# Patient Record
Sex: Female | Born: 1935 | Race: White | Hispanic: No | Smoking: Former smoker
Health system: Southern US, Community
[De-identification: ages and names within clinical notes are randomized; demographics above are authoritative.]

## PROBLEM LIST (undated history)

## (undated) DIAGNOSIS — I2510 Atherosclerotic heart disease of native coronary artery without angina pectoris: Secondary | ICD-10-CM

## (undated) DIAGNOSIS — I5189 Other ill-defined heart diseases: Secondary | ICD-10-CM

## (undated) DIAGNOSIS — G4733 Obstructive sleep apnea (adult) (pediatric): Secondary | ICD-10-CM

## (undated) DIAGNOSIS — Z87891 Personal history of nicotine dependence: Secondary | ICD-10-CM

## (undated) DIAGNOSIS — R058 Other specified cough: Secondary | ICD-10-CM

## (undated) DIAGNOSIS — T464X5A Adverse effect of angiotensin-converting-enzyme inhibitors, initial encounter: Secondary | ICD-10-CM

## (undated) DIAGNOSIS — J449 Chronic obstructive pulmonary disease, unspecified: Secondary | ICD-10-CM

## (undated) DIAGNOSIS — I272 Pulmonary hypertension, unspecified: Secondary | ICD-10-CM

## (undated) DIAGNOSIS — R05 Cough: Secondary | ICD-10-CM

## (undated) DIAGNOSIS — I10 Essential (primary) hypertension: Secondary | ICD-10-CM

## (undated) HISTORY — DX: Pulmonary hypertension, unspecified: I27.20

## (undated) HISTORY — PX: VAGINAL HYSTERECTOMY: SUR661

## (undated) HISTORY — PX: CATARACT EXTRACTION, BILATERAL: SHX1313

## (undated) HISTORY — DX: Cough: R05

## (undated) HISTORY — PX: TONSILLECTOMY: SUR1361

## (undated) HISTORY — PX: GALLBLADDER SURGERY: SHX652

## (undated) HISTORY — DX: Atherosclerotic heart disease of native coronary artery without angina pectoris: I25.10

## (undated) HISTORY — PX: CARPAL TUNNEL RELEASE: SHX101

## (undated) HISTORY — PX: VARICOSE VEIN SURGERY: SHX832

## (undated) HISTORY — DX: Other ill-defined heart diseases: I51.89

## (undated) HISTORY — DX: Essential (primary) hypertension: I10

## (undated) HISTORY — DX: Chronic obstructive pulmonary disease, unspecified: J44.9

## (undated) HISTORY — DX: Obstructive sleep apnea (adult) (pediatric): G47.33

## (undated) HISTORY — DX: Other specified cough: R05.8

## (undated) HISTORY — DX: Adverse effect of angiotensin-converting-enzyme inhibitors, initial encounter: T46.4X5A

## (undated) HISTORY — PX: BLADDER REPAIR: SHX76

## (undated) HISTORY — DX: Personal history of nicotine dependence: Z87.891

---

## 1997-12-03 ENCOUNTER — Other Ambulatory Visit: Admission: RE | Admit: 1997-12-03 | Discharge: 1997-12-03 | Payer: Self-pay | Admitting: Family Medicine

## 1998-08-13 HISTORY — PX: OTHER SURGICAL HISTORY: SHX169

## 1998-09-23 ENCOUNTER — Encounter: Payer: Self-pay | Admitting: Family Medicine

## 1998-09-23 ENCOUNTER — Ambulatory Visit (HOSPITAL_COMMUNITY): Admission: RE | Admit: 1998-09-23 | Discharge: 1998-09-23 | Payer: Self-pay | Admitting: Family Medicine

## 1998-12-01 ENCOUNTER — Encounter: Payer: Self-pay | Admitting: Obstetrics & Gynecology

## 1998-12-05 ENCOUNTER — Encounter: Payer: Self-pay | Admitting: Obstetrics & Gynecology

## 1998-12-06 ENCOUNTER — Inpatient Hospital Stay (HOSPITAL_COMMUNITY): Admission: RE | Admit: 1998-12-06 | Discharge: 1998-12-08 | Payer: Self-pay | Admitting: Obstetrics & Gynecology

## 1999-02-06 ENCOUNTER — Encounter: Payer: Self-pay | Admitting: Thoracic Surgery (Cardiothoracic Vascular Surgery)

## 1999-02-07 ENCOUNTER — Inpatient Hospital Stay (HOSPITAL_COMMUNITY)
Admission: RE | Admit: 1999-02-07 | Discharge: 1999-02-11 | Payer: Self-pay | Admitting: Thoracic Surgery (Cardiothoracic Vascular Surgery)

## 1999-02-07 ENCOUNTER — Encounter: Payer: Self-pay | Admitting: Thoracic Surgery (Cardiothoracic Vascular Surgery)

## 1999-02-08 ENCOUNTER — Encounter: Payer: Self-pay | Admitting: Thoracic Surgery (Cardiothoracic Vascular Surgery)

## 1999-02-10 ENCOUNTER — Encounter: Payer: Self-pay | Admitting: Thoracic Surgery (Cardiothoracic Vascular Surgery)

## 1999-06-19 ENCOUNTER — Other Ambulatory Visit: Admission: RE | Admit: 1999-06-19 | Discharge: 1999-06-19 | Payer: Self-pay | Admitting: Obstetrics & Gynecology

## 1999-09-29 ENCOUNTER — Encounter: Payer: Self-pay | Admitting: Family Medicine

## 1999-09-29 ENCOUNTER — Ambulatory Visit (HOSPITAL_COMMUNITY): Admission: RE | Admit: 1999-09-29 | Discharge: 1999-09-29 | Payer: Self-pay | Admitting: Family Medicine

## 2000-07-19 ENCOUNTER — Other Ambulatory Visit: Admission: RE | Admit: 2000-07-19 | Discharge: 2000-07-19 | Payer: Self-pay | Admitting: Obstetrics & Gynecology

## 2000-09-30 ENCOUNTER — Encounter: Payer: Self-pay | Admitting: Family Medicine

## 2000-09-30 ENCOUNTER — Ambulatory Visit (HOSPITAL_COMMUNITY): Admission: RE | Admit: 2000-09-30 | Discharge: 2000-09-30 | Payer: Self-pay | Admitting: Family Medicine

## 2001-10-02 ENCOUNTER — Encounter: Payer: Self-pay | Admitting: Family Medicine

## 2001-10-02 ENCOUNTER — Ambulatory Visit (HOSPITAL_COMMUNITY): Admission: RE | Admit: 2001-10-02 | Discharge: 2001-10-02 | Payer: Self-pay | Admitting: Family Medicine

## 2002-03-23 ENCOUNTER — Encounter: Admission: RE | Admit: 2002-03-23 | Discharge: 2002-03-23 | Payer: Self-pay | Admitting: Podiatry

## 2002-03-23 ENCOUNTER — Encounter: Payer: Self-pay | Admitting: Podiatry

## 2002-03-25 ENCOUNTER — Ambulatory Visit (HOSPITAL_BASED_OUTPATIENT_CLINIC_OR_DEPARTMENT_OTHER): Admission: RE | Admit: 2002-03-25 | Discharge: 2002-03-25 | Payer: Self-pay | Admitting: Podiatry

## 2002-08-27 ENCOUNTER — Other Ambulatory Visit: Admission: RE | Admit: 2002-08-27 | Discharge: 2002-08-27 | Payer: Self-pay | Admitting: Obstetrics & Gynecology

## 2002-10-07 ENCOUNTER — Ambulatory Visit (HOSPITAL_COMMUNITY): Admission: RE | Admit: 2002-10-07 | Discharge: 2002-10-07 | Payer: Self-pay | Admitting: *Deleted

## 2004-09-14 ENCOUNTER — Other Ambulatory Visit: Admission: RE | Admit: 2004-09-14 | Discharge: 2004-09-14 | Payer: Self-pay | Admitting: Obstetrics & Gynecology

## 2004-09-18 ENCOUNTER — Encounter: Admission: RE | Admit: 2004-09-18 | Discharge: 2004-09-18 | Payer: Self-pay | Admitting: Family Medicine

## 2005-10-23 ENCOUNTER — Other Ambulatory Visit: Admission: RE | Admit: 2005-10-23 | Discharge: 2005-10-23 | Payer: Self-pay | Admitting: Obstetrics & Gynecology

## 2005-11-06 ENCOUNTER — Encounter: Admission: RE | Admit: 2005-11-06 | Discharge: 2005-11-06 | Payer: Self-pay | Admitting: Family Medicine

## 2006-04-18 ENCOUNTER — Encounter: Admission: RE | Admit: 2006-04-18 | Discharge: 2006-05-16 | Payer: Self-pay | Admitting: Family Medicine

## 2007-05-01 ENCOUNTER — Ambulatory Visit (HOSPITAL_COMMUNITY): Admission: RE | Admit: 2007-05-01 | Discharge: 2007-05-01 | Payer: Self-pay | Admitting: Cardiovascular Disease

## 2007-05-02 ENCOUNTER — Ambulatory Visit (HOSPITAL_COMMUNITY): Admission: RE | Admit: 2007-05-02 | Discharge: 2007-05-02 | Payer: Self-pay | Admitting: Cardiovascular Disease

## 2007-05-14 HISTORY — PX: OTHER SURGICAL HISTORY: SHX169

## 2007-05-20 ENCOUNTER — Ambulatory Visit (HOSPITAL_COMMUNITY): Admission: RE | Admit: 2007-05-20 | Discharge: 2007-05-20 | Payer: Self-pay | Admitting: Cardiovascular Disease

## 2007-05-20 HISTORY — PX: CARDIAC CATHETERIZATION: SHX172

## 2008-10-20 ENCOUNTER — Encounter: Admission: RE | Admit: 2008-10-20 | Discharge: 2008-10-20 | Payer: Self-pay | Admitting: Family Medicine

## 2008-11-29 ENCOUNTER — Encounter: Admission: RE | Admit: 2008-11-29 | Discharge: 2008-11-29 | Payer: Self-pay | Admitting: Obstetrics & Gynecology

## 2010-05-15 ENCOUNTER — Encounter: Admission: RE | Admit: 2010-05-15 | Discharge: 2010-05-15 | Payer: Self-pay | Admitting: Family Medicine

## 2010-12-26 NOTE — Cardiovascular Report (Signed)
NAMETAMICHA, TOWSLEY NO.:  [id number]   MEDICAL RECORD NO.:  [id number]          PATIENT TYPE:  OIB   LOCATION:  2899                         FACILITY:  [facility]   PHYSICIAN:  Shelva Majestic, M.D.     DATE OF BIRTH:  1927/03/27   DATE OF PROCEDURE:  05/20/2007  DATE OF DISCHARGE:                            CARDIAC CATHETERIZATION   INDICATIONS:  Jo Hendrix is a very pleasant, 77 year old female who  has history of hypertension with documented diastolic dysfunction and  mild pulmonary hypertension.  She has noticed recent increasing  shortness of breath, particularly exertionally precipitated.  Nuclear  Myoview study demonstrated mild to moderate ischemia in the mid  anteroseptal region at a workload of only 6 METS suggesting possible LAD  ischemia.  Definitive diagnostic cardiac catheterization was  recommended.   PROCEDURE:  After premedication with Valium 5 mg intravenously, the  patient was prepped and draped in usual fashion.  The right femoral  artery was punctured anteriorly and a 5-French sheath was inserted  without difficulty.  Initially, a 5-French, FL-4 catheter was inserted,  but ultimately this was exchanged for a 5-French, FL 3.5 catheter.  Selective angiography in the left coronary system was performed.  A 5-  [place] FR-4 catheter was used for selective angiography in the right  coronary artery.  There was an anterior takeoff to the RCA vessel.  A 5-  French pigtail catheter was used for biplane cine left ventriculography.  With the patient's history of difficult to control hypertension, distal  aortography was also done to make certain she did not have any  renovascular etiology to her hypertension.  Hemostasis was obtained by  direct manual pressure.  The patient tolerated the procedure well.   HEMODYNAMIC DATA:  Central aortic pressure is 155/74.  Left ventricular  pressure is 155/15.   ANGIOGRAPHIC DATA:  Left main coronary artery  immediately gave rise to a  septal perforating vessel in the region of the ostium.  The left main  was a long vessel which distally bifurcated into an LAD and circumflex  vessel.  Left main was free of obstructive disease.   The left anterior descending artery had evidence for mild 20% narrowing  proximally after the takeoff of the first septal perforating artery and  before the diagonal vessel.  The midportion of the LAD seemed to dip  intramyocardially and there was a subtle suggestion of very mild  midsystolic bridging.  The apical LAD extended to the apex.   The circumflex vessel was angiographically normal and gave rise to a  proximal bifurcating marginal vessel as well as a second marginal  vessel.   The right coronary artery had an anterior takeoff.  There was luminal  irregularity with narrowing of 40-50% diffusely in the mid RCA segment  proximal to the marginal branch.  The RCA ended in the PDA and small  posterolateral vessel.   Biplane cine left ventriculography revealed normal contractility without  definitive focal segmental wall motion abnormality.  Ejection fraction  is at least 55%.   Distal aortography demonstrated mild luminal irregularity of  the  infrarenal aorta without obstructive disease.  Renal arteries were  widely patent without stenosis.  There was no significant iliac disease.   IMPRESSION:  1. Normal left ventricular function.  2. Mild coronary obstructive disease with 20% narrowing in the      proximal LAD with an area of mild midsystolic bridging in the mid      left anterior descending and evidence for diffuse luminal      irregularity of 40-50% in the mid right coronary artery.   RECOMMENDATIONS:  Medical therapy.           ______________________________  Shelva Majestic, M.D.     TK/MEDQ  D:  05/20/2007  T:  05/20/2007  Job:  [id number]   cc:   Catheterization Lab  [facility]

## 2010-12-29 NOTE — Op Note (Signed)
Jo Hendrix, Jo Hendrix                             ACCOUNT NO.:  [id number]   MEDICAL RECORD NO.:  [id number]                    PATIENT TYPE:   LOCATION:                                       FACILITY:   PHYSICIAN:  Norma S. Regal, D.P.M.              DATE OF BIRTH:   DATE OF PROCEDURE:  03/25/2002  DATE OF DISCHARGE:                                 OPERATIVE REPORT   PREOPERATIVE DIAGNOSES:  Plantar fasciitis, left heel.   POSTOPERATIVE DIAGNOSES:  Plantar fasciitis, left heel.   OPERATION PERFORMED:  Endoscopic release, medial fascial band of the left  heel.   SURGEON:  Amado Coe. Regal, D.P.M.   ANESTHESIA:  General inhalation by department of anesthesia.   TOURNIQUET:  Ankle tourniquet, left foot.   INDICATIONS FOR PROCEDURE:  Chronic discomfort, inability to walk without  pain, failure to respond to conservative steroidal, nonsteroidal and  mechanical support therapy.   DESCRIPTION OF PROCEDURE:  The patient was brought to the operating room and  placed to the operating room and placed in supine position on the operating  table.  She was then given general inhalation anesthesia.  I did a  preoperative block with 2 cc Xylocaine and a posterior tib block and 10 cc  Xylocaine Marcaine infiltration block.  The patient's left foot was prepped  and draped utilizing standard technique.  Left foot was exsanguinated with  Esmarch and the left ankle tourniquet was inflated to 250 mmHg.  The  following procedure was performed.  Endoscopic release of the medial fascial  band, left heel.  Attention was directed to the medial aspect of the left  heel where an approximately 1 to 1.5 cm incision was made on the medial  aspect of the heel.  The incision was deepened through subcutaneous tissue  and I created a plane plantar to the thickened plantar fascial tendon.  This  was carried through to the lateral side.  A cannula was then introduced from  medial to lateral direction.  A small  incision was made on the lateral side  to allow for an exit point.  After doing this, a 30 degree scope was  introduced into the medial side and the tissue was visualized.  It was found  to be a white thickened planter fascia that was visualized across its entire  breadth.  I then went ahead and from the lateral side I began the cut on the  medial fascial band keeping it very precise to the medial fascial band  itself.  I then switched the scope to the lateral side and I continued the  cut medially.  I was able to transect exactly the medial one third of the  plantar fascia down to muscle.  The incision was flushed with copious  amounts of antibiotic solution.  The skin was reapproximated utilizing 4-0  nylon and 1 cc of dexamethasone was utilized  postoperatively.  The patient  was taken to anesthesia and was discharged in satisfactory condition.                                                 Amado Coe. Regal, D.P.M.    NSR/MEDQ  D:  03/25/2002  T:  03/27/2002  Job:  [id number]

## 2011-03-27 ENCOUNTER — Other Ambulatory Visit: Payer: Self-pay | Admitting: Obstetrics & Gynecology

## 2011-03-27 DIAGNOSIS — R928 Other abnormal and inconclusive findings on diagnostic imaging of breast: Secondary | ICD-10-CM

## 2011-03-29 ENCOUNTER — Other Ambulatory Visit: Payer: Self-pay | Admitting: Family Medicine

## 2011-03-29 ENCOUNTER — Ambulatory Visit
Admission: RE | Admit: 2011-03-29 | Discharge: 2011-03-29 | Disposition: A | Discharge status: Home / Self Care | Payer: Medicare HMO | Source: Ambulatory Visit | Attending: Family Medicine | Admitting: Family Medicine

## 2011-03-29 DIAGNOSIS — R0602 Shortness of breath: Secondary | ICD-10-CM

## 2011-04-03 ENCOUNTER — Other Ambulatory Visit: Payer: Self-pay | Admitting: Obstetrics & Gynecology

## 2011-04-03 ENCOUNTER — Ambulatory Visit
Admission: RE | Admit: 2011-04-03 | Discharge: 2011-04-03 | Disposition: A | Discharge status: Home / Self Care | Payer: Medicare HMO | Source: Ambulatory Visit | Attending: Obstetrics & Gynecology | Admitting: Obstetrics & Gynecology

## 2011-04-03 ENCOUNTER — Other Ambulatory Visit: Payer: Self-pay | Admitting: Diagnostic Radiology

## 2011-04-03 DIAGNOSIS — R928 Other abnormal and inconclusive findings on diagnostic imaging of breast: Secondary | ICD-10-CM

## 2011-04-14 HISTORY — PX: OTHER SURGICAL HISTORY: SHX169

## 2011-04-17 ENCOUNTER — Ambulatory Visit (HOSPITAL_COMMUNITY)
Admission: RE | Admit: 2011-04-17 | Discharge: 2011-04-17 | Disposition: A | Discharge status: Home / Self Care | Payer: Medicare HMO | Source: Ambulatory Visit | Attending: Cardiovascular Disease | Admitting: Cardiovascular Disease

## 2011-04-17 DIAGNOSIS — R0602 Shortness of breath: Secondary | ICD-10-CM | POA: Insufficient documentation

## 2011-04-17 DIAGNOSIS — Z01811 Encounter for preprocedural respiratory examination: Secondary | ICD-10-CM | POA: Insufficient documentation

## 2011-05-04 ENCOUNTER — Other Ambulatory Visit: Payer: Self-pay | Admitting: Ophthalmology

## 2011-08-14 HISTORY — PX: TRANSTHORACIC ECHOCARDIOGRAM: SHX275

## 2011-09-14 HISTORY — PX: NM MYOCAR PERF WALL MOTION: HXRAD629

## 2012-01-28 HISTORY — PX: OTHER SURGICAL HISTORY: SHX169

## 2012-01-28 LAB — PULMONARY FUNCTION TEST

## 2012-02-08 ENCOUNTER — Ambulatory Visit
Admission: RE | Admit: 2012-02-08 | Discharge: 2012-02-08 | Disposition: A | Discharge status: Home / Self Care | Payer: Self-pay | Source: Ambulatory Visit | Attending: Cardiovascular Disease | Admitting: Cardiovascular Disease

## 2012-02-08 ENCOUNTER — Other Ambulatory Visit: Payer: Self-pay | Admitting: Cardiovascular Disease

## 2012-02-08 DIAGNOSIS — R0602 Shortness of breath: Secondary | ICD-10-CM

## 2012-02-28 ENCOUNTER — Encounter: Payer: Self-pay | Admitting: Pulmonary Disease

## 2012-03-21 ENCOUNTER — Encounter: Payer: Self-pay | Admitting: Pulmonary Disease

## 2012-03-24 ENCOUNTER — Encounter: Payer: Self-pay | Admitting: Pulmonary Disease

## 2012-03-24 ENCOUNTER — Ambulatory Visit (INDEPENDENT_AMBULATORY_CARE_PROVIDER_SITE_OTHER): Payer: Medicare Other | Admitting: Pulmonary Disease

## 2012-03-24 VITALS — BP 152/80 | HR 84 | Temp 98.0°F | Ht 59.5 in | Wt 149.6 lb

## 2012-03-24 DIAGNOSIS — R0989 Other specified symptoms and signs involving the circulatory and respiratory systems: Secondary | ICD-10-CM

## 2012-03-24 DIAGNOSIS — J439 Emphysema, unspecified: Secondary | ICD-10-CM

## 2012-03-24 DIAGNOSIS — R0600 Dyspnea, unspecified: Secondary | ICD-10-CM

## 2012-03-24 DIAGNOSIS — J438 Other emphysema: Secondary | ICD-10-CM

## 2012-03-24 MED ORDER — TIOTROPIUM BROMIDE MONOHYDRATE 18 MCG IN CAPS: 18.0000 ug | ORAL_CAPSULE | Freq: Every day | RESPIRATORY_TRACT | Status: DC

## 2012-03-24 NOTE — Patient Instructions (Addendum)
Spiriva one puff daily Will arrange for oxygen test at night Follow up in 6 weeks

## 2012-03-24 NOTE — Progress Notes (Deleted)
  Subjective:    Patient ID: Jo Hendrix, female    DOB: 1927-01-15, 78 y.o.   MRN: [id number]  HPI    Review of Systems  Constitutional: Negative for fever, appetite change and unexpected weight change.  HENT: Negative for ear pain, congestion, sore throat, sneezing, trouble swallowing and dental problem.   Respiratory: Positive for shortness of breath. Negative for cough.   Cardiovascular: Negative for chest pain, palpitations and leg swelling.  Gastrointestinal: Negative for abdominal pain.  Musculoskeletal: Positive for arthralgias. Negative for joint swelling.  Skin: Negative for rash.  Neurological: Negative for headaches.  Psychiatric/Behavioral: Negative for decreased concentration. The patient is not nervous/anxious.        Objective:   Physical Exam        Assessment & Plan:

## 2012-03-24 NOTE — Progress Notes (Signed)
Chief Complaint  Patient presents with  . Advice Only    refer Dr. Claiborne Billings. Pt c/o sob w/ exertion x several years. denies any cough, wheezing, chest tx    History of Present Illness: Jo Hendrix is a 78 y.o. female former smoker for evaluation of dyspnea.  She has noticed trouble with her breathing for years.  She thinks this started when she was put on blood pressure medicines.  She stopped these medicines on her own, and felt her breathing was improved slightly.  She had persistent hypertension, and had blood pressure medication resumed.  She has been followed by Dr. Claiborne Billings with cardiology.  Her cardiac evaluation has been relatively unremarkable for cause of her dyspnea.  She had chest xray which was suggestive of emphysema, and recent cardio-pulmonary stress test was suggestive of pulmonary cause for her exercise limitation.  As a result she was referred to pulmonary for further assessment.  She can now only walk about 150 feet before getting winded.  She denies cough, wheeze, sputum, or hemoptysis.  She was at the beach one week ago, and had transient episode of unsteadiness.  She denies chest pain, palpitations, leg pain, or leg swelling.  She has lived in [place] most of her life.  She worked as a Research scientist (physical sciences).  There is no prior history of asthma, pneumonia, or exposure to tuberculosis.  She started smoking at age 24, smoke 1/2 pack per day, and quit in 1986.  Her husband was a heavy smoker.  She denies any animal exposure.  There is no history of thrombo-embolic disease.  She has a history of sleep apnea.  She uses BPAP at night.  She has not needed supplemental oxygen.    She has never been on inhalers for her breathing.  She had left lung surgery in 2000 for what turned out to be a benign lesion.  Past Medical History  Diagnosis Date  . Hypertension   . Mild pulmonary hypertension   . Diastolic dysfunction   . ACE-inhibitor cough   . OSA (obstructive sleep apnea)      Past Surgical History  Procedure Date  . Gallbladder surgery   . Benign lesion removed on lung 2000  . Vaginal hysterectomy   . Tonsillectomy   . Cataract extraction, bilateral   . Bladder repair   . Carpal tunnel release     x2  . Varicose vein surgery     Current Outpatient Prescriptions on File Prior to Visit  Medication Sig Dispense Refill  . aspirin 81 MG tablet Take 81 mg by mouth daily.      . Calcium 1500 MG tablet Take 1,500 mg by mouth daily.      . fexofenadine (ALLEGRA) 180 MG tablet Take 180 mg by mouth daily as needed.      . fluticasone (FLONASE) 50 MCG/ACT nasal spray Place 2 sprays into the nose daily as needed.      Marland Kitchen losartan-hydrochlorothiazide (HYZAAR) 50-12.5 MG per tablet Once a day      . Multiple Vitamins-Calcium (DAILY COMBO MULTIVITS/CALCIUM PO) Take 1 tablet by mouth 2 (two) times daily.      . Omega-3 Fatty Acids (FISH OIL) 1000 MG CAPS Take 1 capsule by mouth 3 (three) times daily.      Marland Kitchen tiotropium (SPIRIVA) 18 MCG inhalation capsule Place 1 capsule (18 mcg total) into inhaler and inhale daily.  30 capsule  5    Allergies  Allergen Reactions  . Ace Inhibitors Cough  .  Codeine     difficulty urinating  . Erythromycin   . Ivp Dye (Iodinated Diagnostic Agents)     hives    Family History  Problem Relation Age of Onset  . Lung cancer Mother   . Cancer Sister   . Breast cancer Daughter     History  Substance Use Topics  . Smoking status: Former Smoker -- 0.5 packs/day for 25 years    Types: Cigarettes    Quit date: 08/13/1984  . Smokeless tobacco: Not on file  . Alcohol Use: No    Review of Systems  Constitutional: Negative for fever, appetite change and unexpected weight change.  HENT: Negative for ear pain, congestion, sore throat, sneezing, trouble swallowing and dental problem.   Respiratory: Positive for shortness of breath. Negative for cough.   Cardiovascular: Negative for chest pain, palpitations and leg swelling.   Gastrointestinal: Negative for abdominal pain.  Musculoskeletal: Positive for arthralgias. Negative for joint swelling.  Skin: Negative for rash.  Neurological: Negative for headaches.  Psychiatric/Behavioral: Negative for decreased concentration. The patient is not nervous/anxious.    Physical Exam: Filed Vitals:   03/24/12 1556  BP: 152/80  Pulse: 84  Temp: 98 F (36.7 C)  TempSrc: Oral  Height: 4' 11.5" (1.511 m)  Weight: 149 lb 9.6 oz (67.858 kg)  SpO2: 94%  ,  Current Encounter SPO2  03/24/12 1556 94%    Wt Readings from Last 3 Encounters:  03/24/12 149 lb 9.6 oz (67.858 kg)    Body mass index is 29.71 kg/(m^2).   General - No distress ENT - TM clear, no sinus tenderness, no oral exudate, no LAN, no thyromegaly Cardiac - s1s2 regular, no murmur, pulses symmetric, no edema Chest - normal respiratory excursion, good air entry, no wheeze/rales/dullness Back - no focal tenderness Abd - soft, non-tender, no organomegaly, + bowel sounds Ext - normal motor strength Neuro - Cranial nerves are normal. PERLA. EOM's intact. Skin - no discernible active dermatitis, erythema, urticaria or inflammatory process. Psych - normal mood, and behavior.   Dg Chest 2 View  02/08/2012  *RADIOLOGY REPORT*  Clinical Data: Shortness of breath.  CHEST - 2 VIEW  Comparison: 03/29/2011.  Findings: The cardiac silhouette, mediastinal and hilar contours are normal and stable.  There are emphysematous and bronchitic type lung changes but no acute pulmonary findings.  No pleural effusion or pneumothorax.  The bony thorax is intact.  Mild stable degenerative changes in the thoracic spine.  IMPRESSION: Chronic emphysematous and bronchitic changes but no acute pulmonary findings.  Original Report Authenticated By: P. Kalman Jewels, M.D.    Echo 10/11/11>>EF 50 to 55%, mild MR/TR/PR, nl LV and RV systolic fx  Labs from [id number] CBC and CMET  CPST 01/28/12>>pulmonary impairment   PFT  01/28/12>>FEV1 1.77 (137%), FEV1% 75, DLCO 59%  Assessment/Plan:  [name], MD [place] [facility]/Critical Care/Sleep Pager:  [phone] 03/25/2012, 1:14 PM

## 2012-03-25 ENCOUNTER — Encounter: Payer: Self-pay | Admitting: Pulmonary Disease

## 2012-03-25 DIAGNOSIS — J439 Emphysema, unspecified: Secondary | ICD-10-CM | POA: Insufficient documentation

## 2012-03-25 DIAGNOSIS — R0600 Dyspnea, unspecified: Secondary | ICD-10-CM | POA: Insufficient documentation

## 2012-03-25 NOTE — Assessment & Plan Note (Signed)
She has extensive prior history of smoking as well as second hand tobacco exposure.  She has CXR findings suggestive of emphysema.  She has diffusion defect on recent PFT with borderline spirometry.    I will give her therapeutic trial of spiriva.    She also had borderline oxygenation with exertion on room air (down to 89%).  Will arrange for ONO with BPAP and room air to further assess whether she needs home oxygen.

## 2012-03-25 NOTE — Assessment & Plan Note (Addendum)
Likely related to emphysema.  Her cardiac evaluation is unremarkable.  There is nothing to suggestive thrombo-embolic disease or neuromuscular weakness.  Depending on her response to inhaler therapy, will determine if she needs additional pulmonary testing.

## 2012-03-28 ENCOUNTER — Telehealth: Payer: Self-pay | Admitting: Pulmonary Disease

## 2012-03-28 MED ORDER — TIOTROPIUM BROMIDE MONOHYDRATE 18 MCG IN CAPS: 18.0000 ug | ORAL_CAPSULE | Freq: Every day | RESPIRATORY_TRACT | Status: DC

## 2012-03-28 NOTE — Telephone Encounter (Signed)
I spoke with pt and she stated the spiriva has been doing well for her. She would like a 3 month supply sent to [facility]. I have sent rx. Nothing further was needed

## 2012-04-03 ENCOUNTER — Telehealth: Payer: Self-pay | Admitting: Pulmonary Disease

## 2012-04-03 ENCOUNTER — Encounter: Payer: Self-pay | Admitting: Pulmonary Disease

## 2012-04-03 NOTE — Telephone Encounter (Signed)
ONO with BPAP and RA 03/27/12>>Test time 8 hrs 4 min.  Mean SpO2 93.4%, low SpO2 77%.  Spent 10 min with SpO2 < 88%.  Will have my nurse inform patient that oxygen test was okay.  She is to continue BPAP at night, but does not need oxygen at night at this time.

## 2012-04-04 ENCOUNTER — Telehealth: Payer: Self-pay | Admitting: Pulmonary Disease

## 2012-04-04 NOTE — Telephone Encounter (Signed)
Pt advised. Jennifer Castillo, CMA  

## 2012-04-04 NOTE — Telephone Encounter (Signed)
lmomtcb x1 for pt 

## 2012-04-04 NOTE — Telephone Encounter (Signed)
I spoke with the pt and she states she has not seen any difference in her breathing since being on the spiriva. She has been on this x 11 days. I advised the pt to give it more time and see how she feels at follow-up and discuss with Dr. Halford Chessman at that time. Pt states understanding. Oglesby Bing, CMA

## 2012-04-07 ENCOUNTER — Telehealth: Payer: Self-pay | Admitting: Pulmonary Disease

## 2012-04-07 NOTE — Telephone Encounter (Signed)
Mindy called the pt on 04-04-12 to discuss ONO results, and then I spoke with the pt later that day. Pt states understanding and I advised we have removed her cell number from contact list. Oneida Bing, CMA

## 2012-04-10 ENCOUNTER — Encounter: Payer: Self-pay | Admitting: Pulmonary Disease

## 2012-04-11 ENCOUNTER — Telehealth: Payer: Self-pay | Admitting: Pulmonary Disease

## 2012-04-11 NOTE — Telephone Encounter (Signed)
Please explain to pt that this can happen with spiriva.  She can try using spiriva every other day for one week.  If symptoms persist, then she should stop taking spiriva.  She should call back if her symptoms do not improve by next week.

## 2012-04-11 NOTE — Telephone Encounter (Signed)
Called and spoke with patient says she is feeling "weak and tired in her lower extremities."  Patient says she notices that after taking spiriva this occurs as well as more labored breathing for a few hours but then throughout the day she eventually feels better.  Questioning is this related to the spiriva because she said she saw that a side effect of spiriva was weakness in LE.  Dr. Halford Chessman please advise thank you.

## 2012-04-11 NOTE — Telephone Encounter (Signed)
Called and spoke with patient informed her of recs per Dr. Halford Chessman, patient verbalized understanding of this and nothing further was needed at this time.

## 2012-05-05 ENCOUNTER — Encounter: Payer: Self-pay | Admitting: Pulmonary Disease

## 2012-05-05 ENCOUNTER — Ambulatory Visit (INDEPENDENT_AMBULATORY_CARE_PROVIDER_SITE_OTHER): Payer: Medicare Other | Admitting: Pulmonary Disease

## 2012-05-05 VITALS — BP 124/80 | HR 74 | Temp 98.0°F | Ht 60.0 in | Wt 150.0 lb

## 2012-05-05 DIAGNOSIS — R0600 Dyspnea, unspecified: Secondary | ICD-10-CM

## 2012-05-05 DIAGNOSIS — J438 Other emphysema: Secondary | ICD-10-CM

## 2012-05-05 DIAGNOSIS — R0609 Other forms of dyspnea: Secondary | ICD-10-CM

## 2012-05-05 DIAGNOSIS — J439 Emphysema, unspecified: Secondary | ICD-10-CM

## 2012-05-05 MED ORDER — MOMETASONE FURO-FORMOTEROL FUM 100-5 MCG/ACT IN AERO: 2.0000 | INHALATION_SPRAY | Freq: Two times a day (BID) | RESPIRATORY_TRACT | Status: DC

## 2012-05-05 NOTE — Progress Notes (Signed)
Chief Complaint  Patient presents with  . Follow-up    Pt states breathing is about the same as last OV--no change. Pt feels that after using the Spiriva that her breathing is actually worse than the days she does not use it. Using Spiriva every other day.    History of Present Illness: Jo Hendrix is a 79 y.o. female former smoker with dyspnea and emphysema.  She has tried using spiriva.  This has not helped.  In fact this makes her breathing worse.    She feels okay with routine activity, but gets winded with strenous activity.  She does not have much cough.  Tests: Echo 10/11/11>>EF 50 to 55%, mild MR/TR/PR, nl LV and RV systolic fx  Labs from [id number] CBC and CMET  CPST 01/28/12>>pulmonary impairment  PFT 01/28/12>>FEV1 1.77 (137%), FEV1% 75, DLCO 59% ONO with BPAP and RA 03/27/12>>Test time 8 hrs 4 min. Mean SpO2 93.4%, low SpO2 77%. Spent 10 min with SpO2 < 88%.  Past Medical History  Diagnosis Date  . Hypertension   . Mild pulmonary hypertension   . Diastolic dysfunction   . ACE-inhibitor cough   . OSA (obstructive sleep apnea)     Past Surgical History  Procedure Date  . Gallbladder surgery   . Benign lesion removed on lung 2000  . Vaginal hysterectomy   . Tonsillectomy   . Cataract extraction, bilateral   . Bladder repair   . Carpal tunnel release     x2  . Varicose vein surgery     Outpatient Encounter Prescriptions as of 05/05/2012  Medication Sig Dispense Refill  . acetaminophen (TYLENOL) 500 MG tablet Take 500 mg by mouth every 8 (eight) hours as needed.      Marland Kitchen aspirin 81 MG tablet Take 81 mg by mouth daily.      . Calcium 1500 MG tablet Take 1,500 mg by mouth daily.      . fexofenadine (ALLEGRA) 180 MG tablet Take 180 mg by mouth daily as needed.      . fluticasone (FLONASE) 50 MCG/ACT nasal spray Place 2 sprays into the nose daily as needed.      Marland Kitchen losartan-hydrochlorothiazide (HYZAAR) 50-12.5 MG per tablet Once a day      . Multiple  Vitamins-Calcium (DAILY COMBO MULTIVITS/CALCIUM PO) Take 1 tablet by mouth 2 (two) times daily.      . Omega-3 Fatty Acids (FISH OIL) 1000 MG CAPS Take 1 capsule by mouth 3 (three) times daily.      Marland Kitchen tiotropium (SPIRIVA) 18 MCG inhalation capsule Place 18 mcg into inhaler and inhale every other day.      Marland Kitchen DISCONTD: tiotropium (SPIRIVA) 18 MCG inhalation capsule Place 1 capsule (18 mcg total) into inhaler and inhale daily.  90 capsule  2  . DISCONTD: HYDROcodone-acetaminophen (VICODIN) 5-500 MG per tablet Every 6 hrs as needed        Allergies  Allergen Reactions  . Ace Inhibitors Cough  . Codeine     difficulty urinating  . Erythromycin   . Ivp Dye (Iodinated Diagnostic Agents)     hives    Physical Exam:  Filed Vitals:   05/05/12 1435  BP: 124/80  Pulse: 74  Temp: 98 F (36.7 C)  TempSrc: Oral  Height: 5' (1.524 m)  Weight: 150 lb (68.04 kg)  SpO2: 92%    Current Encounter SPO2  05/05/12 1435 92%  03/24/12 1556 94%     Body mass index is 29.30 kg/(m^2). Wt Readings from  Last 2 Encounters:  05/05/12 150 lb (68.04 kg)  03/24/12 149 lb 9.6 oz (67.858 kg)    General - No distress  ENT - TM clear, no sinus tenderness, no oral exudate, no LAN, no thyromegaly  Cardiac - s1s2 regular, no murmur, pulses symmetric, no edema  Chest - normal respiratory excursion, good air entry, no wheeze/rales/dullness  Back - no focal tenderness  Abd - soft, non-tender, no organomegaly, + bowel sounds  Ext - normal motor strength  Neuro - Cranial nerves are normal. PERLA. EOM's intact.  Skin - no discernible active dermatitis, erythema, urticaria or inflammatory process.  Psych - normal mood, and behavior.  Assessment/Plan:  [name], MD [place] [facility]/Critical Care/Sleep Pager:  [phone] 05/05/2012, 2:53 PM

## 2012-05-05 NOTE — Assessment & Plan Note (Signed)
Likely related to emphysema.

## 2012-05-05 NOTE — Patient Instructions (Signed)
Dulera two puffs twice per day, and rinse mouth after each use Call in two weeks to update status after trying dulera Follow up in 4 months

## 2012-05-19 ENCOUNTER — Telehealth: Payer: Self-pay | Admitting: Pulmonary Disease

## 2012-05-19 NOTE — Telephone Encounter (Signed)
ATC pt at # provided above -- received msg that "you have reached a [employer].  You cannot leave a msg because the mailbox has not been set up yet.  Pls try your call again later."  [facility]

## 2012-05-20 MED ORDER — ALBUTEROL SULFATE HFA 108 (90 BASE) MCG/ACT IN AERS: 2.0000 | INHALATION_SPRAY | Freq: Four times a day (QID) | RESPIRATORY_TRACT | Status: DC | PRN

## 2012-05-20 NOTE — Telephone Encounter (Signed)
Pt returned call. Jo Hendrix  

## 2012-05-20 NOTE — Telephone Encounter (Signed)
I spoke with pt and she stated the dulera is causing her to have dizziness and feels shaky for several hours after using this inhaler. This has been going on since she started this inhaler 05/06/12. She also is no longer on spiriva bc she had side effects from that as well. Pt is requesting further recs since she only has 6 puffs left. Please advise Dr. Halford Chessman. thanks

## 2012-05-20 NOTE — Telephone Encounter (Signed)
I called # listed and lmomtcb x1 for pt.

## 2012-05-20 NOTE — Telephone Encounter (Signed)
She can stop dulera.  She can try as needed proair.  Please send script for this.  She is to use two puffs as needed for cough, wheeze, chest congestion, or shortness of breath.  She can use this up to four times per day as needed.  Dispense 1 with 5 refills.

## 2012-05-20 NOTE — Telephone Encounter (Signed)
Pt aware of VS recs. She stated she wants to call and see how much this will cost her first before we send ins cript. She will call back. Will awaot call back

## 2012-05-20 NOTE — Telephone Encounter (Signed)
Returning call can be reached at [phone].Jo Hendrix

## 2012-05-20 NOTE — Telephone Encounter (Signed)
I spoke with pt and she stated her insurance will cover this. She [facility] i have done so. Nothing further was needed

## 2012-05-23 ENCOUNTER — Telehealth: Payer: Self-pay | Admitting: Pulmonary Disease

## 2012-05-23 NOTE — Telephone Encounter (Signed)
I spoke with pt and advised her she advised [place] to send her RX to  the mail order pharmacy and they are sent for 3 month supply. She stated she just hates to waste money on this medication and it not help like she did with the spiriva. She stated she will give it a try and see how things go. Nothing further was needed

## 2012-09-04 ENCOUNTER — Encounter: Payer: Self-pay | Admitting: Pulmonary Disease

## 2012-09-04 ENCOUNTER — Ambulatory Visit (INDEPENDENT_AMBULATORY_CARE_PROVIDER_SITE_OTHER): Payer: Medicare Other | Admitting: Pulmonary Disease

## 2012-09-04 VITALS — BP 150/80 | HR 81 | Temp 99.4°F | Ht 59.5 in | Wt <= 1120 oz

## 2012-09-04 DIAGNOSIS — J438 Other emphysema: Secondary | ICD-10-CM

## 2012-09-04 DIAGNOSIS — J439 Emphysema, unspecified: Secondary | ICD-10-CM

## 2012-09-04 DIAGNOSIS — R0600 Dyspnea, unspecified: Secondary | ICD-10-CM

## 2012-09-04 DIAGNOSIS — R0609 Other forms of dyspnea: Secondary | ICD-10-CM

## 2012-09-04 NOTE — Patient Instructions (Signed)
Follow up in 1 year Call if help needed sooner 

## 2012-09-04 NOTE — Assessment & Plan Note (Signed)
Likely related to emphysema.

## 2012-09-04 NOTE — Progress Notes (Signed)
Chief Complaint  Patient presents with  . Follow-up    Pt denies any concerns at this time. Breathing is doing well    History of Present Illness: Jo Hendrix is a 79 y.o. female former smoker with dyspnea and emphysema.  She gets winded with activity still.  She does okay if she paces her activity level, and as such does not feel like she has significant limitation with her activity.  She denies cough, wheeze, sputum, chest congestion, or leg swelling.  She has not been using albuterol much. It doesn't seem to help much.  She continues to use BiPAP at night.  TESTS: Echo 10/11/11>>EF 50 to 55%, mild MR/TR/PR, nl LV and RV systolic fx  CPST [id number] impairment  PFT 01/28/12>>FEV1 1.77 (137%), FEV1% 75, DLCO 59%  ONO with BPAP and RA 03/27/12>>Test time 8 hrs 4 min. Mean SpO2 93.4%, low SpO2 77%. Spent 10 min with SpO2 < 88%.    Past Medical History  Diagnosis Date  . Hypertension   . Mild pulmonary hypertension   . Diastolic dysfunction   . ACE-inhibitor cough   . OSA (obstructive sleep apnea)     Past Surgical History  Procedure Date  . Gallbladder surgery   . Benign lesion removed on lung 2000  . Vaginal hysterectomy   . Tonsillectomy   . Cataract extraction, bilateral   . Bladder repair   . Carpal tunnel release     x2  . Varicose vein surgery     Outpatient Encounter Prescriptions as of 09/04/2012  Medication Sig Dispense Refill  . acetaminophen (TYLENOL) 500 MG tablet Take 500 mg by mouth every 8 (eight) hours as needed.      Marland Kitchen albuterol (PROAIR HFA) 108 (90 BASE) MCG/ACT inhaler Inhale 2 puffs into the lungs 4 (four) times daily as needed for wheezing or shortness of breath (cough or chest congestion).  3 Inhaler  1  . aspirin 81 MG tablet Take 81 mg by mouth daily.      . Calcium 1500 MG tablet Take 1,500 mg by mouth daily.      . fexofenadine (ALLEGRA) 180 MG tablet Take 180 mg by mouth daily as needed.      . fluticasone (FLONASE) 50 MCG/ACT  nasal spray Place 2 sprays into the nose daily as needed.      Marland Kitchen losartan-hydrochlorothiazide (HYZAAR) 50-12.5 MG per tablet Once a day      . Multiple Vitamins-Calcium (DAILY COMBO MULTIVITS/CALCIUM PO) Take 1 tablet by mouth 2 (two) times daily.      . Omega-3 Fatty Acids (FISH OIL) 1000 MG CAPS Take 1 capsule by mouth 3 (three) times daily.      Marland Kitchen zolpidem (AMBIEN) 5 MG tablet Take 5 mg by mouth at bedtime as needed.        Allergies  Allergen Reactions  . Ace Inhibitors Cough  . Codeine     difficulty urinating  . Erythromycin   . Ivp Dye (Iodinated Diagnostic Agents)     hives    Physical Exam:  Filed Vitals:   09/04/12 1208  BP: 150/80  Pulse: 81  Temp: 99.4 F (37.4 C)  TempSrc: Oral  Height: 4' 11.5" (1.511 m)  Weight: 54 lb 6.4 oz (24.676 kg)  SpO2: 94%     Current Encounter SPO2  09/04/12 1208 94%  05/05/12 1435 92%  03/24/12 1556 94%     Body mass index is 10.80 kg/(m^2).   Wt Readings from Last 2 Encounters:  09/04/12  54 lb 6.4 oz (24.676 kg)  05/05/12 150 lb (68.04 kg)     General - No distress ENT - No sinus tenderness, MP 4, scalloped tongue, no oral exudate, no LAN Cardiac - s1s2 regular, no murmur Chest - No wheeze/rales/dullness Back - No focal tenderness Abd - Soft, non-tender Ext - No edema Neuro - Normal strength Skin - No rashes Psych - normal mood, and behavior   Assessment/Plan:  [name], MD  Pulmonary/Critical Care/Sleep Pager:  [phone] 09/04/2012, 12:35 PM

## 2012-09-04 NOTE — Assessment & Plan Note (Signed)
She has extensive prior history of smoking as well as second hand tobacco exposure.  She has CXR findings suggestive of emphysema.  She has diffusion defect on recent PFT with borderline spirometry.  Her symptoms are minimal however.  Will continue prn albuterol >> discussed when she should try using this.  Will monitor clinically, and follow up in one year.

## 2013-03-02 ENCOUNTER — Other Ambulatory Visit: Payer: Self-pay

## 2013-03-02 DIAGNOSIS — Z1231 Encounter for screening mammogram for malignant neoplasm of breast: Secondary | ICD-10-CM

## 2013-04-15 ENCOUNTER — Ambulatory Visit
Admission: RE | Admit: 2013-04-15 | Discharge: 2013-04-15 | Disposition: A | Discharge status: Home / Self Care | Payer: Medicare Other | Source: Ambulatory Visit

## 2013-04-15 DIAGNOSIS — Z1231 Encounter for screening mammogram for malignant neoplasm of breast: Secondary | ICD-10-CM

## 2013-04-23 ENCOUNTER — Encounter (INDEPENDENT_AMBULATORY_CARE_PROVIDER_SITE_OTHER): Payer: Self-pay

## 2013-04-23 ENCOUNTER — Ambulatory Visit (INDEPENDENT_AMBULATORY_CARE_PROVIDER_SITE_OTHER): Payer: Medicare Other | Admitting: Neurology

## 2013-04-23 DIAGNOSIS — Z0289 Encounter for other administrative examinations: Secondary | ICD-10-CM

## 2013-04-23 DIAGNOSIS — M79672 Pain in left foot: Secondary | ICD-10-CM

## 2013-04-23 DIAGNOSIS — M79609 Pain in unspecified limb: Secondary | ICD-10-CM

## 2013-04-23 NOTE — Procedures (Signed)
    [facility]  NCS (NERVE CONDUCTION STUDY) WITH EMG (ELECTROMYOGRAPHY) REPORT   STUDY DATE: 04/22/2013 PATIENT NAME: Jo Hendrix DOB: 05/29/1927 MRN: [id number]    TECHNOLOGIST: Elwin Mocha ELECTROMYOGRAPHER: Marcial Pacas M.D.  CLINICAL INFORMATION:   79 years old Caucasian female, presenting with two-month history of left second, third, first toe pain at metatarsal joint, there was no significant weakness, she has midline low back pain, no radiating pain.  On examination, bilateral lower extremity motor and sensory examination was normal, deep tendon reflexes were normal and present,Including ankle reflex, left second, third, first toe tenderness metatarsal joints,  FINDINGS: NERVE CONDUCTION STUDY: Bilateral peroneal sensory responses were normal. Bilateral tibial, right peroneal to EDB motor responses were normal.  Left peroneal to EDB motor response showed mildly decreased C. map amplitude with normal distal latency, conduction velocity,   NEEDLE ELECTROMYOGRAPHY: Selected needle examination was performed at left lower extremity muscles, left lumbosacral paraspinal muscles  Needle examination of left tibialis anterior, tibialis posterior, medial gastrocnemius, vastus lateralis, biceps femoris long head was normal..  There was no spontaneous activity at left lumbosacral paraspinal muscles, left L4, L5, S1  IMPRESSION:  This is a slight abnormal study. There is no electrodiagnostic evidence of peripheral neuropathy, or left lumbosacral radiculopathy, the mildly decreased left peroneal to EDB motor response is commonly seen in the elderly patient with a mild left deep peroneal branch neuropathy.   INTERPRETING PHYSICIAN:   Marcial Pacas M.D. Ph.D. [facility] [address] [place], [place] [address] [phone]

## 2013-08-19 ENCOUNTER — Other Ambulatory Visit: Payer: Self-pay | Admitting: *Deleted

## 2013-08-19 ENCOUNTER — Telehealth: Payer: Self-pay | Admitting: Cardiovascular Disease

## 2013-08-19 MED ORDER — LOSARTAN POTASSIUM-HCTZ 50-12.5 MG PO TABS: 1.0000 | ORAL_TABLET | Freq: Every day | ORAL | Status: DC

## 2013-08-19 NOTE — Telephone Encounter (Signed)
Prescription sent to [facility]

## 2013-08-19 NOTE — Telephone Encounter (Signed)
Has changed insurance and pharmacy for this year.  Needs Losartin 50-12.5 mg once daily called to [place] Fax [phone].  Phone #[phone]. Wants to get in 90 day supply  Please call.

## 2013-08-19 NOTE — Telephone Encounter (Signed)
Sent to mail order the previous RX was printed instead.

## 2013-08-31 ENCOUNTER — Telehealth: Payer: Self-pay | Admitting: Cardiovascular Disease

## 2013-08-31 MED ORDER — LOSARTAN POTASSIUM-HCTZ 50-12.5 MG PO TABS
1.0000 | ORAL_TABLET | Freq: Every day | ORAL | Status: DC
Start: 2013-08-31 — End: 2013-10-28

## 2013-08-31 NOTE — Telephone Encounter (Signed)
Returned call and pt verified x 2.  Pt stated she has new insurance and needs her prescription sent to the mail order pharmacy.  Pt stated the med is losartan-hctz 50-12.5 mg and she will use [facility] mail order.  Refill(s) sent to pharmacy for 90-day w/o refills until she is seen on 3.9.15 at 10:15am w/ Dr. Claiborne Billings.  Pt verbalized understanding and agreed w/ plan.

## 2013-08-31 NOTE — Telephone Encounter (Signed)
Needs Losartin 50-12.5 mg 1 daily  Gets 90 day supply  Needs called to [facility] (Due to change in insurance from last year)  Please call.

## 2013-09-17 ENCOUNTER — Ambulatory Visit (INDEPENDENT_AMBULATORY_CARE_PROVIDER_SITE_OTHER): Payer: Medicare HMO | Admitting: Pulmonary Disease

## 2013-09-17 ENCOUNTER — Encounter: Payer: Self-pay | Admitting: Pulmonary Disease

## 2013-09-17 VITALS — BP 122/82 | HR 64 | Ht 59.0 in | Wt 143.0 lb

## 2013-09-17 DIAGNOSIS — J438 Other emphysema: Secondary | ICD-10-CM

## 2013-09-17 DIAGNOSIS — J439 Emphysema, unspecified: Secondary | ICD-10-CM

## 2013-09-17 NOTE — Patient Instructions (Signed)
Follow up as needed

## 2013-09-17 NOTE — Progress Notes (Signed)
Chief Complaint  Patient presents with  . Emphysema    Breathing is doing well. Denies SOB, chest tightness, coughing or wheezing.    History of Present Illness: Jo Hendrix is a 80 y.o. female former smoker with dyspnea and emphysema.  She has been doing well.  She does not use inhalers.  She denies cough, wheeze, sputum, chest pain, or leg swelling.  She does not feel her breathing limits her activity in anyway.  TESTS: Echo 10/11/11>>EF 50 to 55%, mild MR/TR/PR, nl LV and RV systolic fx  CPST 24/58/09>>[id number] impairment  PFT 01/28/12>>FEV1 1.77 (137%), FEV1% 75, DLCO 59%  ONO with BPAP and RA 03/27/12>>Test time 8 hrs 4 min. Mean SpO2 93.4%, low SpO2 77%. Spent 10 min with SpO2 < 88%.   She  has a past medical history of Hypertension; Mild pulmonary hypertension; Diastolic dysfunction; ACE-inhibitor cough; and OSA (obstructive sleep apnea).  She  has past surgical history that includes Gallbladder surgery; benign lesion removed on lung (2000); Vaginal hysterectomy; Tonsillectomy; Cataract extraction, bilateral; Bladder repair; Carpal tunnel release; and Varicose vein surgery.   Outpatient Encounter Prescriptions as of 09/17/2013  Medication Sig  . aspirin 81 MG tablet Take 81 mg by mouth daily.  . Calcium 1500 MG tablet Take 1,500 mg by mouth daily.  Marland Kitchen losartan-hydrochlorothiazide (HYZAAR) 50-12.5 MG per tablet Take 1 tablet by mouth daily. Keep appointment for refills.  . Omega-3 Fatty Acids (FISH OIL) 1000 MG CAPS Take 1 capsule by mouth 3 (three) times daily.  Marland Kitchen zolpidem (AMBIEN) 5 MG tablet Take 5 mg by mouth at bedtime as needed.  . [DISCONTINUED] Multiple Vitamins-Calcium (DAILY COMBO MULTIVITS/CALCIUM PO) Take 1 tablet by mouth 2 (two) times daily.  Marland Kitchen acetaminophen (TYLENOL) 500 MG tablet Take 500 mg by mouth every 8 (eight) hours as needed.  . [DISCONTINUED] albuterol (PROAIR HFA) 108 (90 BASE) MCG/ACT inhaler Inhale 2 puffs into the lungs 4 (four) times daily as needed  for wheezing or shortness of breath (cough or chest congestion).  . [DISCONTINUED] fexofenadine (ALLEGRA) 180 MG tablet Take 180 mg by mouth daily as needed.  . [DISCONTINUED] fluticasone (FLONASE) 50 MCG/ACT nasal spray Place 2 sprays into the nose daily as needed.    Allergies  Allergen Reactions  . Ace Inhibitors Cough  . Codeine     difficulty urinating  . Erythromycin   . Ivp Dye [Iodinated Diagnostic Agents]     hives    Physical Exam:  General - No distress ENT - No sinus tenderness, MP 4, scalloped tongue, no oral exudate, no LAN Cardiac - s1s2 regular, no murmur Chest - No wheeze/rales/dullness Back - No focal tenderness Abd - Soft, non-tender Ext - No edema Neuro - Normal strength Skin - No rashes Psych - normal mood, and behavior   Assessment/Plan:  [name], MD [place] Pulmonary/Critical Care/Sleep Pager:  [phone] 09/17/2013, 11:36 AM

## 2013-09-17 NOTE — Assessment & Plan Note (Signed)
She has extensive prior history of smoking as well as second hand tobacco exposure.  She has CXR findings suggestive of emphysema.  She has diffusion defect on recent PFT with borderline spirometry.  However, her symptoms are non-existent.   Advised her to call to schedule pulmonary follow up if she develops symptoms.

## 2013-10-13 ENCOUNTER — Encounter: Payer: Self-pay | Admitting: Cardiovascular Disease

## 2013-10-19 ENCOUNTER — Ambulatory Visit (INDEPENDENT_AMBULATORY_CARE_PROVIDER_SITE_OTHER): Payer: Commercial Managed Care - HMO | Admitting: Cardiovascular Disease

## 2013-10-28 ENCOUNTER — Other Ambulatory Visit: Payer: Self-pay

## 2013-10-28 MED ORDER — LOSARTAN POTASSIUM-HCTZ 50-12.5 MG PO TABS: 1.0000 | ORAL_TABLET | Freq: Every day | ORAL | Status: DC

## 2013-10-28 NOTE — Telephone Encounter (Signed)
Rx was sent to pharmacy electronically.  Patient needs to keep her appointment with Dr Corky Downs on 12/03/2013 at 10:45a for future refills.

## 2013-11-10 ENCOUNTER — Ambulatory Visit: Payer: Medicare HMO | Admitting: Cardiovascular Disease

## 2013-11-13 ENCOUNTER — Ambulatory Visit: Payer: Medicare HMO | Admitting: Cardiovascular Disease

## 2013-11-30 ENCOUNTER — Encounter: Payer: Self-pay | Admitting: *Deleted

## 2013-12-03 ENCOUNTER — Ambulatory Visit: Payer: Commercial Managed Care - HMO | Admitting: Cardiovascular Disease

## 2013-12-03 ENCOUNTER — Encounter: Payer: Self-pay | Admitting: Cardiovascular Disease

## 2013-12-03 ENCOUNTER — Ambulatory Visit (INDEPENDENT_AMBULATORY_CARE_PROVIDER_SITE_OTHER): Payer: Commercial Managed Care - HMO | Admitting: Cardiovascular Disease

## 2013-12-03 VITALS — BP 160/72 | HR 62 | Ht 59.0 in | Wt 141.8 lb

## 2013-12-03 DIAGNOSIS — I2510 Atherosclerotic heart disease of native coronary artery without angina pectoris: Secondary | ICD-10-CM

## 2013-12-03 DIAGNOSIS — E782 Mixed hyperlipidemia: Secondary | ICD-10-CM

## 2013-12-03 DIAGNOSIS — G4731 Primary central sleep apnea: Secondary | ICD-10-CM | POA: Insufficient documentation

## 2013-12-03 DIAGNOSIS — G4730 Sleep apnea, unspecified: Secondary | ICD-10-CM

## 2013-12-03 DIAGNOSIS — I10 Essential (primary) hypertension: Secondary | ICD-10-CM

## 2013-12-03 DIAGNOSIS — E785 Hyperlipidemia, unspecified: Secondary | ICD-10-CM

## 2013-12-03 MED ORDER — EZETIMIBE 10 MG PO TABS: 10.0000 mg | ORAL_TABLET | Freq: Every day | ORAL | Status: DC

## 2013-12-03 NOTE — Patient Instructions (Signed)
Your physician has recommended you make the following change in your medication: start new prescription for zetia. This has already  Been sent to the pharmacy.  Your physician recommends that you return for lab work in: 3 months FASTING.  Your physician recommends that you schedule a follow-up appointment in: 4 MONTHS.

## 2013-12-03 NOTE — Progress Notes (Signed)
Patient ID: Jo Hendrix, female   DOB: [birth year], 80 y.o.   MRN: [id number]     HPI: Jo Hendrix is a 80 y.o. female who presents to the office for a one-year cardiology evaluation.  Jo Hendrix is a very pleasant 80 year old female who has a long-standing history of hypertension with documented grade 1 diastolic dysfunction.  She also has a history of complex sleep apnea with documented mild coronary hypertension and has been on BiPAP, although several ventilation with good results.  He in October 2008.  She underwent cardiac catheterization after developing recurrent episodes of chest pain.  This showed mild coronary obstructive disease with 20% narrowing of the proximal LAD with mid LAD systolic bridging and diffuse luminal irregularities of 40-50% in the RCA.  She has had difficulty with shortness of breath and has been diagnosed with COPD/emphysema.  She did undergo a cardiopulmonary met test, which revealed normal functional status with peak oxygen consumption 5%, but she had an abnormal pulmonary response with no ventilation perfusion mismatch and appeared circulation with plus/minus dead space.  Over the past year, Jo Hendrix continues to do well.  She now seeing a pulmonologist yearly.  She is not taking routine medications for this.  She has been using her BiPAP 100% compliance.  She states at home typically, her blood pressure is controlled and typically in the 122/70 range and at times has dropped to 106.  Of note, in the past.  She had been on statin therapy, but did not tolerate this.  I review her medical records on statin therapy.  Her LDL cholesterol was 90.  Since she's been off statin, therapy she has had progressive increase in her lipid studies with her LDL increasing to 09/01/2011 and most recently, when checked by [facility].  Her LDL cholesterol was 140 with a total cholesterol 228, HDL 64, triglycerides 117.  She denies chest pain.  She denies PND or orthopnea.  She is unaware of  breakthrough snoring.  She denies residual daytime sleepiness.  Past Medical History  Diagnosis Date  . Hypertension   . Mild pulmonary hypertension   . Diastolic dysfunction   . ACE-inhibitor cough   . OSA (obstructive sleep apnea)     Sleep Study 04/07/2007 - AHI during total sleep 21.30/hr, during REM 20.16/hr - BiPAP auto servo-ventilation unit  . Mild CAD   . COPD (chronic obstructive pulmonary disease)   . History of tobacco use     smoked 25 years    Past Surgical History  Procedure Laterality Date  . Gallbladder surgery    . Benign lesion removed on lung  2000  . Vaginal hysterectomy    . Tonsillectomy    . Cataract extraction, bilateral    . Bladder repair    . Carpal tunnel release      x2  . Varicose vein surgery    . Transthoracic echocardiogram  2013    EF 50-55%; mild MR; mild TR; mild pulm valve regurg; aortic root sclerosis/calcification  . Cardiopulmonary met test  01/28/2012    with PFTs - FEV1 & FEV1/VC WNL, VC WNL, DLCO WNL; abnormal pulmonary response  . Nm myocar perf wall motion  09/2011    lexiscan myoview - normal perfusion, EF 77%, low risk  . Cardiac catheterization  05/20/2007    mild coronary obstructive disease with 20% narrowing in prox LAD, diffuse luminal irregularity of 40-50% in mid RCA (Dr. Corky Downs)  . Carotid doppler  04/2011    normal  patency  . Renal doppler  05/2007    normal renal arteries     Allergies  Allergen Reactions  . Ace Inhibitors Cough  . Codeine     difficulty urinating  . Erythromycin   . Ivp Dye [Iodinated Diagnostic Agents]     hives    Current Outpatient Prescriptions  Medication Sig Dispense Refill  . acetaminophen (TYLENOL) 500 MG tablet Take 500 mg by mouth every 8 (eight) hours as needed.      Marland Kitchen albuterol (PROVENTIL HFA;VENTOLIN HFA) 108 (90 BASE) MCG/ACT inhaler Inhale 2 puffs into the lungs every 6 (six) hours as needed for wheezing or shortness of breath.      Marland Kitchen aspirin 81 MG tablet Take 81 mg by mouth  daily.      . Calcium 1500 MG tablet Take 1,500 mg by mouth daily.      Marland Kitchen losartan-hydrochlorothiazide (HYZAAR) 50-12.5 MG per tablet Take 1 tablet by mouth daily. Keep appointment for refills.  90 tablet  0  . Multiple Vitamin (MULTIVITAMIN) capsule Take 1 capsule by mouth daily.      . NON FORMULARY at bedtime. BiPAP      . Omega-3 Fatty Acids (FISH OIL) 1000 MG CAPS Take 1 capsule by mouth 3 (three) times daily.      Marland Kitchen zolpidem (AMBIEN) 5 MG tablet Take 5 mg by mouth at bedtime as needed.       No current facility-administered medications for this visit.    History   Social History  . Marital Status: Widowed    Spouse Name: N/A    Number of Children: 3  . Years of Education: N/A   Occupational History  . retired    Social History Main Topics  . Smoking status: Former Smoker -- 0.50 packs/day for 25 years    Types: Cigarettes    Quit date: 08/13/1984  . Smokeless tobacco: Never Used  . Alcohol Use: No  . Drug Use: No  . Sexual Activity: Not on file   Other Topics Concern  . Not on file   Social History Narrative  . No narrative on file    Socially, she is widowed, has 3 children, 8 grandchildren 5 great-grandchildren.  Remotely she did smoke for over 30 years ago.  Family History  Problem Relation Age of Onset  . Lung cancer Mother   . Cancer Sister   . Breast cancer Daughter   . Suicidality Father   . Stroke Sister     ROS is negative for fevers, chills or night sweats.  She denies rash.  She denies change in vision or hearing.  She does wear glasses.  She denies wheezing.  She denies PND or orthopnea.  There are no palpitations.  She denies chest tightness.  She does note shortness of breath with activity.  She denies nausea, vomiting, or diarrhea per is aware bleeding.  She denies blood in stool, your.  There is no claudication.  She denies peripheral edema.  She denies neurologic symptoms.  She has been using her BiPAP.  Posterior ventilation with 100%  compliance.   Other comprehensive 14 point system review is negative.  PE BP 160/72  Pulse 62  Ht '4\' 11"'  (1.499 m)  Wt 141 lb 12.8 oz (64.32 kg)  BMI 28.62 kg/m2  Repeat blood pressure by me was 158/70 General: Alert, oriented, no distress , who appears younger than stated age. Skin: normal turgor, no rashes HEENT: Normocephalic, atraumatic. Pupils round and reactive; sclera anicteric;no lid  lag. Extraocular muscles intact;; no xanthelasmas. Nose without nasal septal hypertrophy Mouth/Parynx benign; Mallinpatti scale 3 Neck: No JVD, no carotid bruits; normal carotid upstroke Lungs: clear to ausculatation and percussion; no wheezing or rales Chest wall: no tenderness to palpitation Heart: RRR, s1 s2 normal; over 6 systolic murmur no diastolic murmur, rub thrills or heaves Abdomen: soft, nontender; no hepatosplenomehaly, BS+; abdominal aorta nontender and not dilated by palpation. Back: no CVA tenderness Pulses 2+ Extremities: no clubbing cyanosis or edema, Homan's sign negative  Neurologic: grossly nonfocal; cranial nerves grossly normal. Psychologic: normal affect and mood.  ECG (independently read by me): Normal sinus rhythm at 62 beats per minute.  Nonspecific ST changes.  No ectopy  LABS:  BMET No results found for this basename: na, k, cl, co2, glucose, bun, creatinine, calcium, gfrnonaa, gfraa     Hepatic Function Panel  No results found for this basename: prot, albumin, ast, alt, alkphos, bilitot, bilidir, ibili     CBC No results found for this basename: wbc, rbc, hgb, hct, plt, mcv, mch, mchc, rdw, neutrabs, lymphsabs, monoabs, eosabs, basosabs     BNP No results found for this basename: probnp    Lipid Panel  No results found for this basename: chol, trig, hdl, cholhdl, vldl, ldlcalc     RADIOLOGY: No results found.    ASSESSMENT AND PLAN: Jo Hendrix is a very pleasant 80 year old female who has a long-standing history of hypertension and most  recently has been maintained on valsartan HCT 50/12.5.  Her blood pressure today is elevated.  She states her blood pressure typically is in the 120 range, but at times as low as 106.  For this reason, we'll not increase her medical regimen today.  However, I have instructed her that she should record her blood pressures at least on a weekly basis.  I also reviewed her recent blood work done and equal.  Her LDL has consistently increased, since she's been off statin therapy.  I have recommended she start Zetia 10 mg.  We will recheck lipid studies in approximately 3 months.  She will continue to use her BiPAP Auto SV for complex sleep apnea.  I will see her in 4 months for followup evaluation.  At that time she will bring her blood pressure recordings. Further recommendations will be made at that time if her blood pressure remains elevated additional therapy will be necessary.     Troy Sine, MD, [facility]  12/03/2013 12:19 PM

## 2013-12-21 ENCOUNTER — Telehealth: Payer: Self-pay | Admitting: Cardiovascular Disease

## 2013-12-21 NOTE — Telephone Encounter (Signed)
Please call,having side effects from her Zetia.

## 2013-12-21 NOTE — Telephone Encounter (Signed)
Returned a call to patient in reference to her Zetia. She informs me that when she  takes it she developes chest pressure and trouble breathing. I asked her if she is certain that her symptoms are coming from the Zetia and she replied "yes".  Informs me the symptoms only come when she takes the zetia. I told her to stop the zetia. Dr. Claiborne Billings will be informed. Instructions to follow once reviewed by Dr. Claiborne Billings.

## 2013-12-23 NOTE — Telephone Encounter (Signed)
ok 

## 2013-12-25 ENCOUNTER — Telehealth: Payer: Self-pay | Admitting: *Deleted

## 2013-12-25 NOTE — Telephone Encounter (Signed)
Left message per Dr. Claiborne Billings ok to d/c the zetia if she feels that her symptoms are from the use of it. Call back if questions, or if symptoms persist.

## 2013-12-25 NOTE — Telephone Encounter (Signed)
Message left for patient ok to d/c zetia.

## 2013-12-28 ENCOUNTER — Telehealth: Payer: Self-pay | Admitting: Cardiovascular Disease

## 2013-12-28 NOTE — Telephone Encounter (Signed)
Told to continue the plan of labs in 3 months and follow-up office visit in 6 months.  Agreed and voiced understanding.

## 2013-12-28 NOTE — Telephone Encounter (Signed)
Jo Hendrix wants to know if she still needs to come in 4 months and have her labs repeated in 3 mos.  She has stopped her Zetia.

## 2014-01-25 ENCOUNTER — Other Ambulatory Visit: Payer: Self-pay | Admitting: Dermatology

## 2014-02-10 ENCOUNTER — Ambulatory Visit: Payer: Medicare HMO | Admitting: Cardiovascular Disease

## 2014-02-26 LAB — LIPID PANEL
CHOL/HDL RATIO: 3.2 ratio
CHOLESTEROL: 215 mg/dL — AB (ref 0–200)
HDL: 68 mg/dL (ref >39)
LDL Cholesterol: 119 mg/dL — ABNORMAL HIGH (ref 0–99)
TRIGLYCERIDES: 138 mg/dL (ref <150)
VLDL: 28 mg/dL (ref 0–40)

## 2014-02-26 LAB — COMPREHENSIVE METABOLIC PANEL
ALT: 13 U/L (ref 0–35)
AST: 15 U/L (ref 0–37)
Albumin: 3.6 g/dL (ref 3.5–5.2)
Alkaline Phosphatase: 72 U/L (ref 39–117)
BILIRUBIN TOTAL: 0.4 mg/dL (ref 0.2–1.2)
BUN: 21 mg/dL (ref 6–23)
CALCIUM: 9.2 mg/dL (ref 8.4–10.5)
CHLORIDE: 101 meq/L (ref 96–112)
CO2: 29 meq/L (ref 19–32)
CREATININE: 1.23 mg/dL — AB (ref 0.50–1.10)
GLUCOSE: 78 mg/dL (ref 70–99)
Potassium: 4.8 mEq/L (ref 3.5–5.3)
Sodium: 138 mEq/L (ref 135–145)
Total Protein: 7 g/dL (ref 6.0–8.3)

## 2014-03-15 ENCOUNTER — Other Ambulatory Visit: Payer: Self-pay

## 2014-03-15 DIAGNOSIS — Z1231 Encounter for screening mammogram for malignant neoplasm of breast: Secondary | ICD-10-CM

## 2014-03-30 ENCOUNTER — Other Ambulatory Visit: Payer: Self-pay | Admitting: *Deleted

## 2014-03-30 MED ORDER — LOSARTAN POTASSIUM-HCTZ 50-12.5 MG PO TABS: 1.0000 | ORAL_TABLET | Freq: Every day | ORAL | Status: DC

## 2014-03-30 NOTE — Telephone Encounter (Signed)
Rx was sent to pharmacy electronically. 

## 2014-04-07 ENCOUNTER — Ambulatory Visit: Payer: Commercial Managed Care - HMO | Admitting: Cardiovascular Disease

## 2014-04-07 ENCOUNTER — Encounter: Payer: Self-pay | Admitting: *Deleted

## 2014-04-16 ENCOUNTER — Ambulatory Visit
Admission: RE | Admit: 2014-04-16 | Discharge: 2014-04-16 | Disposition: A | Discharge status: Home / Self Care | Payer: Commercial Managed Care - HMO | Source: Ambulatory Visit

## 2014-04-16 DIAGNOSIS — Z1231 Encounter for screening mammogram for malignant neoplasm of breast: Secondary | ICD-10-CM

## 2014-05-05 ENCOUNTER — Encounter: Payer: Self-pay | Admitting: Cardiovascular Disease

## 2014-05-05 ENCOUNTER — Ambulatory Visit (INDEPENDENT_AMBULATORY_CARE_PROVIDER_SITE_OTHER): Payer: Commercial Managed Care - HMO | Admitting: Cardiovascular Disease

## 2014-05-05 VITALS — BP 144/70 | HR 62 | Ht 59.0 in | Wt 145.5 lb

## 2014-05-05 DIAGNOSIS — G4733 Obstructive sleep apnea (adult) (pediatric): Secondary | ICD-10-CM

## 2014-05-05 DIAGNOSIS — E785 Hyperlipidemia, unspecified: Secondary | ICD-10-CM

## 2014-05-05 DIAGNOSIS — G4737 Central sleep apnea in conditions classified elsewhere: Secondary | ICD-10-CM

## 2014-05-05 DIAGNOSIS — J438 Other emphysema: Secondary | ICD-10-CM

## 2014-05-05 DIAGNOSIS — G4731 Primary central sleep apnea: Secondary | ICD-10-CM

## 2014-05-05 DIAGNOSIS — R0609 Other forms of dyspnea: Secondary | ICD-10-CM

## 2014-05-05 DIAGNOSIS — J439 Emphysema, unspecified: Secondary | ICD-10-CM

## 2014-05-05 DIAGNOSIS — I2510 Atherosclerotic heart disease of native coronary artery without angina pectoris: Secondary | ICD-10-CM

## 2014-05-05 DIAGNOSIS — R0989 Other specified symptoms and signs involving the circulatory and respiratory systems: Secondary | ICD-10-CM

## 2014-05-05 DIAGNOSIS — R0600 Dyspnea, unspecified: Secondary | ICD-10-CM

## 2014-05-05 DIAGNOSIS — I10 Essential (primary) hypertension: Secondary | ICD-10-CM

## 2014-05-05 NOTE — Patient Instructions (Signed)
Your physician wants you to follow-up in: 1 year. You will receive a reminder letter in the mail two months in advance. If you don't receive a letter, please call our office to schedule the follow-up appointment. No changes were made today in your therapy.

## 2014-05-05 NOTE — Progress Notes (Signed)
Patient ID: Jo Hendrix, female   DOB: Jan 04, 1927, 80 y.o.   MRN: [id number]     HPI: Jo Hendrix is a 80 y.o. female who presents to the office for a 6 monthcardiology evaluation.  Ms. Jo Hendrix is a very pleasant 80 year old female who has a long-standing history of hypertension with documented grade 1 diastolic dysfunction.  She also has a history of complex sleep apnea with documented mild coronary hypertension and has been on BiPAP. In October 2008 she underwent cardiac catheterization after developing recurrent episodes of chest pain.  This showed mild coronary obstructive disease with 20% narrowing of the proximal LAD with mid LAD systolic bridging and diffuse luminal irregularities of 40-50% in the RCA.  She has had difficulty with shortness of breath and has been diagnosed with COPD/emphysema.  A cardiopulmonary met test, which revealed normal functional status with peak oxygen consumption 105%, but she had an abnormal pulmonary response with no ventilation perfusion mismatch and appeared circulation with plus/minus dead space.  I last saw her 6 months ago, she feels that she is doing well.  She brought with her today a blood pressure log, which shows her blood pressure at home typically is in the 1:15 to 120 range, and on rare occasions, has increased to 1:30.  She has been taking losartan, HCT 50/12.5, for this.  She has been intolerant to statin therapy.  In the past.  I also tried Zetia but she could not tolerate this well as well.  She is on fish oil 3000 mg daily.  She continues to use her BiPAP.  She denies breakthrough snoring.  She denies hypersomnolence.  Past Medical History  Diagnosis Date  . Hypertension   . Mild pulmonary hypertension   . Diastolic dysfunction   . ACE-inhibitor cough   . OSA (obstructive sleep apnea)     Sleep Study 04/07/2007 - AHI during total sleep 21.30/hr, during REM 20.16/hr - BiPAP auto servo-ventilation unit  . Mild CAD   . COPD (chronic obstructive  pulmonary disease)   . History of tobacco use     smoked 25 years    Past Surgical History  Procedure Laterality Date  . Gallbladder surgery    . Benign lesion removed on lung  2000  . Vaginal hysterectomy    . Tonsillectomy    . Cataract extraction, bilateral    . Bladder repair    . Carpal tunnel release      x2  . Varicose vein surgery    . Transthoracic echocardiogram  2013    EF 50-55%; mild MR; mild TR; mild pulm valve regurg; aortic root sclerosis/calcification  . Cardiopulmonary met test  01/28/2012    with PFTs - FEV1 & FEV1/VC WNL, VC WNL, DLCO WNL; abnormal pulmonary response  . Nm myocar perf wall motion  09/2011    lexiscan myoview - normal perfusion, EF 77%, low risk  . Cardiac catheterization  05/20/2007    mild coronary obstructive disease with 20% narrowing in prox LAD, diffuse luminal irregularity of 40-50% in mid RCA (Dr. Corky Downs)  . Carotid doppler  04/2011    normal patency  . Renal doppler  05/2007    normal renal arteries     Allergies  Allergen Reactions  . Zetia [Ezetimibe]     Chest pressure  . Ace Inhibitors Cough  . Codeine     difficulty urinating  . Erythromycin   . Ivp Dye [Iodinated Diagnostic Agents]     hives  Current Outpatient Prescriptions  Medication Sig Dispense Refill  . NON FORMULARY CPAP therapy      . acetaminophen (TYLENOL) 500 MG tablet Take 500 mg by mouth every 8 (eight) hours as needed.      Marland Kitchen aspirin 81 MG tablet Take 81 mg by mouth daily.      . Calcium 1500 MG tablet Take 1,500 mg by mouth daily.      Marland Kitchen losartan-hydrochlorothiazide (HYZAAR) 50-12.5 MG per tablet Take 1 tablet by mouth daily.  90 tablet  2  . NON FORMULARY at bedtime. BiPAP      . Omega-3 Fatty Acids (FISH OIL) 1000 MG CAPS Take 1 capsule by mouth 3 (three) times daily.      Marland Kitchen zolpidem (AMBIEN) 5 MG tablet Take 5 mg by mouth at bedtime as needed.       No current facility-administered medications for this visit.    History   Social History  .  Marital Status: Widowed    Spouse Name: N/A    Number of Children: 3  . Years of Education: N/A   Occupational History  . retired    Social History Main Topics  . Smoking status: Former Smoker -- 0.50 packs/day for 25 years    Types: Cigarettes    Quit date: 08/13/1984  . Smokeless tobacco: Never Used  . Alcohol Use: No  . Drug Use: No  . Sexual Activity: Not on file   Other Topics Concern  . Not on file   Social History Narrative  . No narrative on file    Socially, she is widowed, has 3 children, 8 grandchildren 5 great-grandchildren.  Remotely she did smoke for over 30 years ago.  Family History  Problem Relation Age of Onset  . Lung cancer Mother   . Cancer Sister   . Breast cancer Daughter   . Suicidality Father   . Stroke Sister    ROS General: Negative; No fevers, chills, or night sweats;  HEENT: Negative; No changes in vision or hearing, sinus congestion, difficulty swallowing Pulmonary: Negative; No cough, wheezing, shortness of breath, hemoptysis Cardiovascular: Negative; No chest pain, presyncope, syncope, palpitations. Positive for mild shortness of breath with activity, unchanged. GI: Negative; No nausea, vomiting, diarrhea, or abdominal pain GU: Negative; No dysuria, hematuria, or difficulty voiding Musculoskeletal: Negative; no myalgias, joint pain, or weakness Hematologic/Oncology: Negative; no easy bruising, bleeding Endocrine: Negative; no heat/cold intolerance; no diabetes Neuro: Negative; no changes in balance, headaches Skin: Negative; No rashes or skin lesions Psychiatric: Negative; No behavioral problems, depression Sleep: Positive for sleep apnea, on therapy; No snoring, daytime sleepiness, hypersomnolence, bruxism, restless legs, hypnogognic hallucinations, no cataplexy Other comprehensive 14 point system review is negative.   PE BP 144/70  Pulse 62  Ht '4\' 11"'  (1.499 m)  Wt 145 lb 8 oz (65.998 kg)  BMI 29.37 kg/m2  Repeat blood  pressure by me was 140/78 General: Alert, oriented, no distress , who appears younger than stated age. Skin: normal turgor, no rashes HEENT: Normocephalic, atraumatic. Pupils round and reactive; sclera anicteric;no lid lag. Extraocular muscles intact;; no xanthelasmas. Nose without nasal septal hypertrophy Mouth/Parynx benign; Mallinpatti scale 3 Neck: No JVD, no carotid bruits; normal carotid upstroke Lungs: clear to ausculatation and percussion; no wheezing or rales Chest wall: no tenderness to palpitation Heart: RRR, s1 s2 normal; 1/6 systolic murmur no diastolic murmur, rub thrills or heaves Abdomen: soft, nontender; no hepatosplenomehaly, BS+; abdominal aorta nontender and not dilated by palpation. Back: no CVA tenderness Pulses  2+ Extremities: no clubbing cyanosis or edema, Homan's sign negative  Neurologic: grossly nonfocal; cranial nerves grossly normal. Psychologic: normal affect and mood.  ECG (independently read by me): Normal sinus rhythm at 62 beats per minute.  Poor progression anteriorly.  Nonspecific ST changes  12/03/2013 ECG (independently read by me): Normal sinus rhythm at 62 beats per minute.  Nonspecific ST changes.  No ectopy  LABS:  BMET    Component Value Date/Time   NA 138 02/26/2014 0840     Hepatic Function Panel     Component Value Date/Time   PROT 7.0 02/26/2014 0840     CBC No results found for this basename: wbc,  rbc,  hgb,  hct,  plt,  mcv,  mch,  mchc,  rdw,  neutrabs,  lymphsabs,  monoabs,  eosabs,  basosabs     BNP No results found for this basename: probnp    Lipid Panel     Component Value Date/Time   CHOL 215* 02/26/2014 0840     RADIOLOGY: No results found.    ASSESSMENT AND PLAN: Ms. Tonimarie Gritz is a very pleasant 80 year old female who has a long-standing history of hypertension and most recently has been maintained on valsartan HCT 50/12.5.  A review of her blood pressure recordings at home consistently revealed her  blood pressure to be early well-controlled.  Her blood pressure today is borderline elevated at 140/78.  She is tolerating the suction HCT 50/25.  There is no edema.  She continues to use her BiPAP for obstructive sleep apnea.  She remains very active and just this past weekend painting several rooms in her house.  She does note some mild shortness of breath with activity, but this is unchanged.  She does have hyperlipidemia.  She could not tolerate even a trial of Zetia.  I did review recent blood work from 2 months ago, and this reveals a cholesterol of 2:15 LDL cholesterol 119, triglycerides 138, and HDL cholesterol 68.  He does have very mild renal insufficiency with a creatinine of 1.23.  She sees Dr. Alessandra Grout, for primary care.  I have suggested that since she sees her on a yearly basis that we can alternate every 6 months seeing her.  For this reason, I will see her in one year for cardiology reevaluation.  However, if her blood pressure at home does increase.  She is to contact us at further recommendations were made at that time.    Troy Sine, MD, [facility]  05/05/2014 10:49 AM

## 2014-06-08 ENCOUNTER — Ambulatory Visit: Payer: Commercial Managed Care - HMO | Admitting: Cardiovascular Disease

## 2014-11-26 DIAGNOSIS — M67471 Ganglion, right ankle and foot: Secondary | ICD-10-CM | POA: Diagnosis not present

## 2014-11-26 DIAGNOSIS — L821 Other seborrheic keratosis: Secondary | ICD-10-CM | POA: Diagnosis not present

## 2014-11-26 DIAGNOSIS — D1801 Hemangioma of skin and subcutaneous tissue: Secondary | ICD-10-CM | POA: Diagnosis not present

## 2014-12-08 DIAGNOSIS — G4733 Obstructive sleep apnea (adult) (pediatric): Secondary | ICD-10-CM | POA: Diagnosis not present

## 2014-12-31 ENCOUNTER — Other Ambulatory Visit: Payer: Self-pay | Admitting: Cardiovascular Disease

## 2015-01-06 ENCOUNTER — Other Ambulatory Visit: Payer: Self-pay | Admitting: Family Medicine

## 2015-01-06 ENCOUNTER — Ambulatory Visit
Admission: RE | Admit: 2015-01-06 | Discharge: 2015-01-06 | Disposition: A | Discharge status: Home / Self Care | Payer: Commercial Managed Care - HMO | Source: Ambulatory Visit | Attending: Family Medicine | Admitting: Family Medicine

## 2015-01-06 DIAGNOSIS — M5137 Other intervertebral disc degeneration, lumbosacral region: Secondary | ICD-10-CM | POA: Diagnosis not present

## 2015-01-06 DIAGNOSIS — M79642 Pain in left hand: Secondary | ICD-10-CM

## 2015-01-06 DIAGNOSIS — M545 Low back pain: Secondary | ICD-10-CM

## 2015-01-06 DIAGNOSIS — I739 Peripheral vascular disease, unspecified: Secondary | ICD-10-CM | POA: Diagnosis not present

## 2015-01-06 DIAGNOSIS — M549 Dorsalgia, unspecified: Secondary | ICD-10-CM | POA: Diagnosis not present

## 2015-01-06 DIAGNOSIS — I25119 Atherosclerotic heart disease of native coronary artery with unspecified angina pectoris: Secondary | ICD-10-CM | POA: Diagnosis not present

## 2015-01-06 DIAGNOSIS — M4316 Spondylolisthesis, lumbar region: Secondary | ICD-10-CM | POA: Diagnosis not present

## 2015-01-06 DIAGNOSIS — M79645 Pain in left finger(s): Secondary | ICD-10-CM | POA: Diagnosis not present

## 2015-01-06 DIAGNOSIS — M47816 Spondylosis without myelopathy or radiculopathy, lumbar region: Secondary | ICD-10-CM | POA: Diagnosis not present

## 2015-01-07 ENCOUNTER — Other Ambulatory Visit: Payer: Self-pay | Admitting: Family Medicine

## 2015-01-07 DIAGNOSIS — I739 Peripheral vascular disease, unspecified: Secondary | ICD-10-CM

## 2015-01-17 ENCOUNTER — Ambulatory Visit
Admission: RE | Admit: 2015-01-17 | Discharge: 2015-01-17 | Disposition: A | Discharge status: Home / Self Care | Payer: Commercial Managed Care - HMO | Source: Ambulatory Visit | Attending: Family Medicine | Admitting: Family Medicine

## 2015-01-17 DIAGNOSIS — I739 Peripheral vascular disease, unspecified: Secondary | ICD-10-CM

## 2015-01-24 DIAGNOSIS — M65342 Trigger finger, left ring finger: Secondary | ICD-10-CM | POA: Diagnosis not present

## 2015-01-26 DIAGNOSIS — Z6831 Body mass index (BMI) 31.0-31.9, adult: Secondary | ICD-10-CM | POA: Diagnosis not present

## 2015-01-26 DIAGNOSIS — N390 Urinary tract infection, site not specified: Secondary | ICD-10-CM | POA: Diagnosis not present

## 2015-01-26 DIAGNOSIS — Z01419 Encounter for gynecological examination (general) (routine) without abnormal findings: Secondary | ICD-10-CM | POA: Diagnosis not present

## 2015-02-23 DIAGNOSIS — M65342 Trigger finger, left ring finger: Secondary | ICD-10-CM | POA: Diagnosis not present

## 2015-03-24 DIAGNOSIS — R0600 Dyspnea, unspecified: Secondary | ICD-10-CM | POA: Diagnosis not present

## 2015-03-24 DIAGNOSIS — I10 Essential (primary) hypertension: Secondary | ICD-10-CM | POA: Diagnosis not present

## 2015-03-28 ENCOUNTER — Telehealth: Payer: Self-pay | Admitting: Cardiovascular Disease

## 2015-03-28 NOTE — Telephone Encounter (Signed)
Received records from [facility] for appointment with Dr Claiborne Billings on 03/30/15.  Records given to [facility] (medical records) for Dr Evette Georges schedule on 03/30/15. lp

## 2015-03-30 ENCOUNTER — Ambulatory Visit (INDEPENDENT_AMBULATORY_CARE_PROVIDER_SITE_OTHER): Payer: Commercial Managed Care - HMO | Admitting: Cardiovascular Disease

## 2015-03-30 ENCOUNTER — Encounter: Payer: Self-pay | Admitting: Cardiovascular Disease

## 2015-03-30 ENCOUNTER — Ambulatory Visit
Admission: RE | Admit: 2015-03-30 | Discharge: 2015-03-30 | Disposition: A | Discharge status: Home / Self Care | Payer: Commercial Managed Care - HMO | Source: Ambulatory Visit | Attending: Cardiovascular Disease | Admitting: Cardiovascular Disease

## 2015-03-30 VITALS — BP 110/76 | HR 66 | Ht 59.0 in | Wt 151.5 lb

## 2015-03-30 DIAGNOSIS — G4731 Primary central sleep apnea: Secondary | ICD-10-CM

## 2015-03-30 DIAGNOSIS — R0602 Shortness of breath: Secondary | ICD-10-CM

## 2015-03-30 DIAGNOSIS — R0600 Dyspnea, unspecified: Secondary | ICD-10-CM

## 2015-03-30 DIAGNOSIS — I2510 Atherosclerotic heart disease of native coronary artery without angina pectoris: Secondary | ICD-10-CM

## 2015-03-30 DIAGNOSIS — I25810 Atherosclerosis of coronary artery bypass graft(s) without angina pectoris: Secondary | ICD-10-CM | POA: Diagnosis not present

## 2015-03-30 DIAGNOSIS — E785 Hyperlipidemia, unspecified: Secondary | ICD-10-CM

## 2015-03-30 DIAGNOSIS — Z79899 Other long term (current) drug therapy: Secondary | ICD-10-CM | POA: Diagnosis not present

## 2015-03-30 DIAGNOSIS — I10 Essential (primary) hypertension: Secondary | ICD-10-CM

## 2015-03-30 DIAGNOSIS — G4733 Obstructive sleep apnea (adult) (pediatric): Secondary | ICD-10-CM

## 2015-03-30 DIAGNOSIS — G4737 Central sleep apnea in conditions classified elsewhere: Secondary | ICD-10-CM

## 2015-03-30 NOTE — Patient Instructions (Addendum)
Your physician has requested that you have an echocardiogram. Echocardiography is a painless test that uses sound waves to create images of your heart. It provides your doctor with information about the size and shape of your heart and how well your heart's chambers and valves are working. This procedure takes approximately one hour. There are no restrictions for this procedure.  Your physician has requested that you have a lexiscan myoview. For further information please visit [email or web address]. Please follow instruction sheet, as given.  Your physician recommends that you return for lab work fasting.  A chest x-ray takes a picture of the organs and structures inside the chest, including the heart, lungs, and blood vessels. This test can show several things, including, whether the heart is enlarges; whether fluid is building up in the lungs; and whether pacemaker / defibrillator leads are still in place.  this will be done @ [facility] physician recommends that you schedule a follow-up appointment in: 6-8 weeks with Dr. Claiborne Billings.

## 2015-03-31 ENCOUNTER — Encounter: Payer: Self-pay | Admitting: Cardiovascular Disease

## 2015-03-31 LAB — COMPREHENSIVE METABOLIC PANEL
ALBUMIN: 3.9 g/dL (ref 3.6–5.1)
ALK PHOS: 72 U/L (ref 33–130)
ALT: 14 U/L (ref 6–29)
AST: 16 U/L (ref 10–35)
BUN: 16 mg/dL (ref 7–25)
CALCIUM: 9.4 mg/dL (ref 8.6–10.4)
CHLORIDE: 101 mmol/L (ref 98–110)
CO2: 27 mmol/L (ref 20–31)
Creat: 1.11 mg/dL — ABNORMAL HIGH (ref 0.60–0.88)
Glucose, Bld: 91 mg/dL (ref 65–99)
POTASSIUM: 4.5 mmol/L (ref 3.5–5.3)
Sodium: 138 mmol/L (ref 135–146)
TOTAL PROTEIN: 7.5 g/dL (ref 6.1–8.1)
Total Bilirubin: 0.5 mg/dL (ref 0.2–1.2)

## 2015-03-31 LAB — LIPID PANEL
CHOL/HDL RATIO: 3.6 ratio (ref <=5.0)
CHOLESTEROL: 241 mg/dL — AB (ref 125–200)
HDL: 67 mg/dL (ref >=46)
LDL Cholesterol: 151 mg/dL — ABNORMAL HIGH (ref <130)
TRIGLYCERIDES: 115 mg/dL (ref <150)
VLDL: 23 mg/dL (ref <30)

## 2015-03-31 LAB — CBC
HEMATOCRIT: 43.1 % (ref 36.0–46.0)
HEMOGLOBIN: 14.9 g/dL (ref 12.0–15.0)
MCH: 30.5 pg (ref 26.0–34.0)
MCHC: 34.6 g/dL (ref 30.0–36.0)
MCV: 88.3 fL (ref 78.0–100.0)
MPV: 9.7 fL (ref 8.6–12.4)
PLATELETS: 324 10*3/uL (ref 150–400)
RBC: 4.88 MIL/uL (ref 3.87–5.11)
RDW: 15 % (ref 11.5–15.5)
WBC: 8.3 10*3/uL (ref 4.0–10.5)

## 2015-03-31 LAB — TSH: TSH: 1.358 u[IU]/mL (ref 0.350–4.500)

## 2015-03-31 NOTE — Progress Notes (Signed)
Patient ID: Jo Hendrix, female   DOB: 21-May-1927, 81 y.o.   MRN: [id number]     HPI: Jo Hendrix is a 81 y.o. female who presents to the office for an 71 monthcardiology evaluation.  Jo Hendrix has a long-standing history of hypertension with documented grade 1 diastolic dysfunction.  She also has a history of complex sleep apnea with documented mild pulmonary hypertension and has been on BiPAP. In October 2008 she underwent cardiac catheterization after developing recurrent episodes of chest pain.  This showed mild coronary obstructive disease with 20% narrowing of the proximal LAD with mid LAD systolic bridging and diffuse luminal irregularities of 40-50% in the RCA.  She has had difficulty with shortness of breath and has been diagnosed with COPD/emphysema.  A cardiopulmonary met test  revealed normal functional status with peak oxygen consumption 105%, but she had an abnormal pulmonary response with no ventilation perfusion mismatch and with plus/minus dead space.  She has been intolerant to statin therapy.  In the past.  I also tried Zetia but she could not tolerate this well as well.  She is on fish oil 3000 mg daily.  She continues to use BiPAP for her complex sleep apnea.  She has been taken losartan HCT for hypertension.  Recently, she has noticed progressive increasing shortness of breath with activity.  She also has noticed some very mild chest tightness when she is short of breath.  She admits to some mild lower extremity edema particularly involving the left leg by the end of the day.  She presents for evaluation    Past Medical History  Diagnosis Date  . Hypertension   . Mild pulmonary hypertension   . Diastolic dysfunction   . ACE-inhibitor cough   . OSA (obstructive sleep apnea)     Sleep Study 04/07/2007 - AHI during total sleep 21.30/hr, during REM 20.16/hr - BiPAP auto servo-ventilation unit  . Mild CAD   . COPD (chronic obstructive pulmonary disease)   . History of  tobacco use     smoked 25 years    Past Surgical History  Procedure Laterality Date  . Gallbladder surgery    . Benign lesion removed on lung  2000  . Vaginal hysterectomy    . Tonsillectomy    . Cataract extraction, bilateral    . Bladder repair    . Carpal tunnel release      x2  . Varicose vein surgery    . Transthoracic echocardiogram  2013    EF 50-55%; mild MR; mild TR; mild pulm valve regurg; aortic root sclerosis/calcification  . Cardiopulmonary met test  01/28/2012    with PFTs - FEV1 & FEV1/VC WNL, VC WNL, DLCO WNL; abnormal pulmonary response  . Nm myocar perf wall motion  09/2011    lexiscan myoview - normal perfusion, EF 77%, low risk  . Cardiac catheterization  05/20/2007    mild coronary obstructive disease with 20% narrowing in prox LAD, diffuse luminal irregularity of 40-50% in mid RCA (Dr. Corky Downs)  . Carotid doppler  04/2011    normal patency  . Renal doppler  05/2007    normal renal arteries     Allergies  Allergen Reactions  . Zetia [Ezetimibe]     Chest pressure  . Ace Inhibitors Cough  . Codeine     difficulty urinating  . Erythromycin   . Ivp Dye [Iodinated Diagnostic Agents]     hives    Current Outpatient Prescriptions  Medication Sig Dispense Refill  .  aspirin 81 MG tablet Take 81 mg by mouth daily.    . Calcium 1500 MG tablet Take 1,500 mg by mouth daily.    . cholecalciferol (VITAMIN D) 1000 UNITS tablet Take 1 tablet by mouth daily. Take 1 tab daily    . losartan-hydrochlorothiazide (HYZAAR) 50-12.5 MG per tablet TAKE 1 TABLET EVERY DAY 90 tablet 3  . NON FORMULARY at bedtime. BiPAP    . Omega-3 Fatty Acids (FISH OIL) 1000 MG CAPS Take 1 capsule by mouth 3 (three) times daily.    . RESTASIS 0.05 % ophthalmic emulsion Place 1 drop into both eyes 2 (two) times daily. Use 1 drop in each eye twice a day     No current facility-administered medications for this visit.    Social History   Social History  . Marital Status: Widowed     Spouse Name: N/A  . Number of Children: 3  . Years of Education: N/A   Occupational History  . retired    Social History Main Topics  . Smoking status: Former Smoker -- 0.50 packs/day for 25 years    Types: Cigarettes    Quit date: 08/13/1984  . Smokeless tobacco: Never Used  . Alcohol Use: No  . Drug Use: No  . Sexual Activity: Not on file   Other Topics Concern  . Not on file   Social History Narrative    Socially, she is widowed, has 3 children, 8 grandchildren 5 great-grandchildren.  Remotely she did smoke for over 30 years ago.  Family History  Problem Relation Age of Onset  . Lung cancer Mother   . Cancer Sister   . Breast cancer Daughter   . Suicidality Father   . Stroke Sister    ROS General: Negative; No fevers, chills, or night sweats;  HEENT: Negative; No changes in vision or hearing, sinus congestion, difficulty swallowing Pulmonary: Negative; No cough, wheezing, shortness of breath, hemoptysis Cardiovascular: See history of present illness Positive for mild shortness of breath with activity, unchanged. GI: Negative; No nausea, vomiting, diarrhea, or abdominal pain GU: Negative; No dysuria, hematuria, or difficulty voiding Musculoskeletal: Negative; no myalgias, joint pain, or weakness Hematologic/Oncology: Negative; no easy bruising, bleeding Endocrine: Negative; no heat/cold intolerance; no diabetes Neuro: Negative; no changes in balance, headaches Skin: Negative; No rashes or skin lesions Psychiatric: Negative; No behavioral problems, depression Sleep: Positive for sleep apnea, on therapy; No snoring, daytime sleepiness, hypersomnolence, bruxism, restless legs, hypnogognic hallucinations, no cataplexy Other comprehensive 14 point system review is negative.   PE BP 110/76 mmHg  Pulse 66  Ht '4\' 11"'  (1.499 m)  Wt 151 lb 8 oz (68.72 kg)  BMI 30.58 kg/m2  Wt Readings from Last 3 Encounters:  03/30/15 151 lb 8 oz (68.72 kg)  05/05/14 145 lb 8 oz  (65.998 kg)  12/03/13 141 lb 12.8 oz (64.32 kg)   General: Alert, oriented, no distress , who appears younger than stated age. Skin: normal turgor, no rashes HEENT: Normocephalic, atraumatic. Pupils round and reactive; sclera anicteric;no lid lag. Extraocular muscles intact;; no xanthelasmas. Nose without nasal septal hypertrophy Mouth/Parynx benign; Mallinpatti scale 3 Neck: No JVD, no carotid bruits; normal carotid upstroke Lungs: clear to ausculatation and percussion; no wheezing or rales Chest wall: no tenderness to palpitation Heart: RRR, s1 s2 normal; 1/6 systolic murmur no diastolic murmur, rub thrills or heaves Abdomen: soft, nontender; no hepatosplenomehaly, BS+; abdominal aorta nontender and not dilated by palpation. Back: no CVA tenderness Pulses 2+ Extremities: Trace left ankle edema;no clubbing  cyanosis , Homan's sign negative  Neurologic: grossly nonfocal; cranial nerves grossly normal. Psychologic: normal affect and mood.  ECG (independently read by me): Normal sinus rhythm at 66 bpm.  Nonspecific T changes.  September 2015 ECG (independently read by me): Normal sinus rhythm at 62 beats per minute.  Poor progression anteriorly.  Nonspecific ST changes  12/03/2013 ECG (independently read by me): Normal sinus rhythm at 62 beats per minute.  Nonspecific ST changes.  No ectopy  LABS:  BMP Latest Ref Rng 02/26/2014  Glucose 70 - 99 mg/dL 78  BUN 6 - 23 mg/dL 21  Creatinine 0.50 - 1.10 mg/dL 1.23(H)  Sodium 135 - 145 mEq/L 138  Potassium 3.5 - 5.3 mEq/L 4.8  Chloride 96 - 112 mEq/L 101  CO2 19 - 32 mEq/L 29  Calcium 8.4 - 10.5 mg/dL 9.2   Hepatic Function Latest Ref Rng 02/26/2014  Total Protein 6.0 - 8.3 g/dL 7.0  Albumin 3.5 - 5.2 g/dL 3.6  AST 0 - 37 U/L 15  ALT 0 - 35 U/L 13  Alk Phosphatase 39 - 117 U/L 72  Total Bilirubin 0.2 - 1.2 mg/dL 0.4   No flowsheet data found. No results found for: MCV No results found for: TSH No results found for: HGBA1C  Lipid  Panel     Component Value Date/Time   CHOL 215* 02/26/2014 0840   TRIG 138 02/26/2014 0840   HDL 68 02/26/2014 0840   CHOLHDL 3.2 02/26/2014 0840   VLDL 28 02/26/2014 0840   LDLCALC 119* 02/26/2014 0840    RADIOLOGY: No results found.    ASSESSMENT AND PLAN: Jo Hendrix is a very pleasant 82 year old female who has a long-standing history of hypertension and most recently has been maintained on losartan HCT 50/12.5.  Her blood pressure today is actually fairly stable.  She has been statin intolerant and is taking omega-3 fatty acid fish oil supplementation.  She has developed increasing shortness of breath with activity and has had mild associated chest tightness.  I'm scheduling her for an echo Doppler study for further evaluation.  I will also schedule her for [place] study to make certain her symptoms are not ischemic in etiology.  A complete set of blood work will be obtained in the fasting state.  Her mother also died of lung CA.  She cannot recall her last chest x-ray.  I will also check a PA and lateral chest x-ray.  I will see her in 6-8 weeks for follow up evaluation or sooner if problems arise.  Time spent: 25 minutes  Troy Sine, MD, [facility]  03/31/2015 7:53 PM

## 2015-04-05 ENCOUNTER — Telehealth: Payer: Self-pay | Admitting: *Deleted

## 2015-04-05 ENCOUNTER — Telehealth (HOSPITAL_COMMUNITY): Payer: Self-pay

## 2015-04-05 NOTE — Telephone Encounter (Signed)
Spoke with patient giving her the lab results and recommendations. Patient states that she thinks she has tried zetia before. She declined for now stating that she will try to lose some weight by the time she has her October appointment and talk with him about it then.

## 2015-04-05 NOTE — Telephone Encounter (Signed)
Patient given detailed instructions per Myocardial Perfusion Study Information Sheet for test on 04-07-2015 at 0930. Patient Notified to arrive at [facility] for Echo, and that it is imperative to arrive on time for appointment to keep from having the test rescheduled. Patient verbalized understanding. Oletta Lamas, [place] Schnapp A

## 2015-04-05 NOTE — Telephone Encounter (Signed)
-----   Message from Troy Sine, MD sent at 04/04/2015  1:44 PM EDT ----- Labs good x lipids; if statin intolerant then try zetia 10 mg.

## 2015-04-07 ENCOUNTER — Ambulatory Visit (HOSPITAL_BASED_OUTPATIENT_CLINIC_OR_DEPARTMENT_OTHER): Payer: Commercial Managed Care - HMO

## 2015-04-07 ENCOUNTER — Other Ambulatory Visit: Payer: Self-pay

## 2015-04-07 ENCOUNTER — Ambulatory Visit (HOSPITAL_COMMUNITY): Payer: Commercial Managed Care - HMO | Attending: Cardiology

## 2015-04-07 DIAGNOSIS — E785 Hyperlipidemia, unspecified: Secondary | ICD-10-CM | POA: Insufficient documentation

## 2015-04-07 DIAGNOSIS — Z87891 Personal history of nicotine dependence: Secondary | ICD-10-CM | POA: Insufficient documentation

## 2015-04-07 DIAGNOSIS — I371 Nonrheumatic pulmonary valve insufficiency: Secondary | ICD-10-CM | POA: Diagnosis not present

## 2015-04-07 DIAGNOSIS — I10 Essential (primary) hypertension: Secondary | ICD-10-CM | POA: Diagnosis not present

## 2015-04-07 DIAGNOSIS — R0602 Shortness of breath: Secondary | ICD-10-CM | POA: Diagnosis not present

## 2015-04-07 DIAGNOSIS — I340 Nonrheumatic mitral (valve) insufficiency: Secondary | ICD-10-CM | POA: Diagnosis not present

## 2015-04-07 DIAGNOSIS — G4733 Obstructive sleep apnea (adult) (pediatric): Secondary | ICD-10-CM | POA: Diagnosis not present

## 2015-04-07 DIAGNOSIS — G4440 Drug-induced headache, not elsewhere classified, not intractable: Secondary | ICD-10-CM

## 2015-04-07 DIAGNOSIS — I071 Rheumatic tricuspid insufficiency: Secondary | ICD-10-CM | POA: Diagnosis not present

## 2015-04-07 LAB — MYOCARDIAL PERFUSION IMAGING
CHL CUP NUCLEAR SDS: 2
CHL CUP NUCLEAR SRS: 0
CHL CUP NUCLEAR SSS: 2
CSEPPHR: 102 {beats}/min
LHR: 0.4
LV dias vol: 42 mL
LVSYSVOL: 12 mL
Rest HR: 64 {beats}/min
TID: 0.92

## 2015-04-07 MED ORDER — REGADENOSON 0.4 MG/5ML IV SOLN
0.4000 mg | Freq: Once | INTRAVENOUS | Status: AC
  Administered 2015-04-07: 0.4 mg via INTRAVENOUS

## 2015-04-07 MED ORDER — TECHNETIUM TC 99M SESTAMIBI GENERIC - CARDIOLITE
31.3000 | Freq: Once | INTRAVENOUS | Status: AC | PRN
  Administered 2015-04-07: 31.3 via INTRAVENOUS

## 2015-04-07 MED ORDER — TECHNETIUM TC 99M SESTAMIBI GENERIC - CARDIOLITE
10.2000 | Freq: Once | INTRAVENOUS | Status: AC | PRN
  Administered 2015-04-07: 10 via INTRAVENOUS

## 2015-04-07 MED ORDER — AMINOPHYLLINE 25 MG/ML IV SOLN
75.0000 mg | Freq: Three times a day (TID) | INTRAVENOUS | Status: DC | PRN
  Administered 2015-04-07: 75 mg via INTRAVENOUS

## 2015-04-07 NOTE — Telephone Encounter (Signed)
ok 

## 2015-04-08 ENCOUNTER — Telehealth: Payer: Self-pay | Admitting: *Deleted

## 2015-04-08 NOTE — Telephone Encounter (Signed)
Called patient. Results given.

## 2015-04-08 NOTE — Telephone Encounter (Signed)
Called patient notified of nuc results.

## 2015-04-08 NOTE — Telephone Encounter (Signed)
-----   Message from Troy Sine, MD sent at 04/07/2015  2:57 PM EDT ----- Normal LV systolic function; grade 1 diastolic dysfunction; trace MR; mild TR.

## 2015-04-15 ENCOUNTER — Encounter: Payer: Self-pay | Admitting: Pulmonary Disease

## 2015-04-15 ENCOUNTER — Ambulatory Visit (INDEPENDENT_AMBULATORY_CARE_PROVIDER_SITE_OTHER): Payer: Commercial Managed Care - HMO | Admitting: Pulmonary Disease

## 2015-04-15 VITALS — BP 124/80 | HR 66 | Ht 59.0 in | Wt 145.6 lb

## 2015-04-15 DIAGNOSIS — J439 Emphysema, unspecified: Secondary | ICD-10-CM

## 2015-04-15 DIAGNOSIS — R0600 Dyspnea, unspecified: Secondary | ICD-10-CM

## 2015-04-15 DIAGNOSIS — G4733 Obstructive sleep apnea (adult) (pediatric): Secondary | ICD-10-CM

## 2015-04-15 MED ORDER — FLUTICASONE FUROATE-VILANTEROL 100-25 MCG/INH IN AEPB: 1.0000 | INHALATION_SPRAY | Freq: Every day | RESPIRATORY_TRACT | Status: DC

## 2015-04-15 NOTE — Patient Instructions (Signed)
Breo one puff daily >> rinse mouth after each use Will arrange for pulmonary function testing Will get copy of BiPAP download Will arrange for overnight oxygen test with BiPAP  Follow up in 6 to 8 weeks

## 2015-04-15 NOTE — Progress Notes (Signed)
Chief Complaint  Patient presents with  . Follow-up    pt states the past couple of months shes had an increase in SOB with any kind of exertion. no issues with sleeping or resting. pt using bipap every night for about 5- 6 hours with no issues.    History of Present Illness: Jo Hendrix is a 81 y.o. female former smoker with dyspnea, emphysema, and OSA on BiPAP.  I last saw her in 2014.  At that time her breathing symptoms were minimal and she did not notice improvement with inhaler therapy.  She has noticed more trouble with her breathing for the past several months.  She is now at a point that she can't walk through the grocery store w/o needing to stop and catch her breath.  After she sites for a few minutes, then she is okay.    She had recent cardiac evaluation and lab work which were unrevealing.    She denies chest pain, wheeze, cough, sputum, hemoptysis, or leg swelling.  There is no hx of thromboembolic disease.  She was tried previously on spiriva >> this made her breathing worse.  She was able to tolerate LABA/ICS combination.   TESTS: PSG 04/07/07 >> AHI 21 CPST 01/28/12 >> pulmonary impairment  PFT 01/28/12 >> FEV1 1.77 (137%), FEV1% 75, DLCO 59%  ONO with BPAP and RA 03/27/12 >> Test time 8 hrs 4 min. Mean SpO2 93.4%, low SpO2 77%. Spent 10 min with SpO2 < 88%. Echo 04/07/15 >> EF 55 to 74%, grade 1 diastolic dysfx   She  has a past medical history of Hypertension; Mild pulmonary hypertension; Diastolic dysfunction; ACE-inhibitor cough; OSA (obstructive sleep apnea); Mild CAD; COPD (chronic obstructive pulmonary disease); and History of tobacco use.  PMHx >> HTN, diastolic dysfunction, CAD, OSA  PSHx, Medications, Allergies, Fhx, Shx reviewed.   Physical Exam: BP 124/80 mmHg  Pulse 66  Ht '4\' 11"'$  (1.499 m)  Wt 145 lb 9.6 oz (66.044 kg)  BMI 29.39 kg/m2  SpO2 99%  General - No distress ENT - No sinus tenderness, MP 4, scalloped tongue, no oral exudate, no  LAN Cardiac - s1s2 regular, no murmur Chest - No wheeze/rales/dullness Back - No focal tenderness Abd - Soft, non-tender Ext - No edema Neuro - Normal strength Skin - No rashes Psych - normal mood, and behavior   CMP Latest Ref Rng 03/30/2015 02/26/2014  Glucose 65 - 99 mg/dL 91 78  BUN 7 - 25 mg/dL 16 21  Creatinine 0.60 - 0.88 mg/dL 1.11(H) 1.23(H)  Sodium 135 - 146 mmol/L 138 138  Potassium 3.5 - 5.3 mmol/L 4.5 4.8  Chloride 98 - 110 mmol/L 101 101  CO2 20 - 31 mmol/L 27 29  Calcium 8.6 - 10.4 mg/dL 9.4 9.2  Total Protein 6.1 - 8.1 g/dL 7.5 7.0  Total Bilirubin 0.2 - 1.2 mg/dL 0.5 0.4  Alkaline Phos 33 - 130 U/L 72 72  AST 10 - 35 U/L 16 15  ALT 6 - 29 U/L 14 13    CBC Latest Ref Rng 03/30/2015  WBC 4.0 - 10.5 K/uL 8.3  Hemoglobin 12.0 - 15.0 g/dL 14.9  Hematocrit 36.0 - 46.0 % 43.1  Platelets 150 - 400 K/uL 324    Dg Chest 2 View  03/30/2015   CLINICAL DATA:  Shortness of breath on exertion for several weeks.  EXAM: CHEST  2 VIEW  COMPARISON:  02/08/2012.  FINDINGS: Trachea is midline. Heart size normal. Lungs are mildly hyperinflated but clear.  No pleural fluid. Advanced degenerative disc disease in the mid thoracic spine.  IMPRESSION: No acute findings.   Electronically Signed   By: Lorin Picket M.D.   On: 03/30/2015 15:12    Discussion: She has progressive dyspnea on exertion.  She has hx of emphysema with diffusion defect on prior PFT.  She also has hx of OSA on BiPAP at night.    Her recent cardiac assessment and lab work were unrevealing for a cause of her dyspnea.  Assessment/Plan:  Emphysema. Plan: - trial of Breo >> sample given - repeat PFT  OSA on BiPAP. Plan: - get BiPAP download  - arrange for ONO with BiPAP and room air  Dyspnea. Plan: - if her symptoms do not improve, then consider doing CT chest and repeating CPST   [name], MD [facility]Critical [facility]/Sleep Pager:  [phone] 04/15/2015, 12:26 PM

## 2015-04-20 DIAGNOSIS — J439 Emphysema, unspecified: Secondary | ICD-10-CM | POA: Diagnosis not present

## 2015-04-21 ENCOUNTER — Telehealth: Payer: Self-pay | Admitting: Pulmonary Disease

## 2015-04-21 NOTE — Telephone Encounter (Signed)
Patient says that [facility] was sent to [place], patient wanted to make sure that [facility] was the only pharmacy listed on her chart. Deleted the other pharmacies from patient's chart per her request Nothing further needed.

## 2015-05-01 ENCOUNTER — Telehealth: Payer: Self-pay | Admitting: Pulmonary Disease

## 2015-05-01 NOTE — Telephone Encounter (Signed)
Auto CPAP 03/20/15 to 04/18/15 >> used on 30 of 30 nights with average 4 hrs and 23 min.  Average AHI is 3.1 with average EPAP 8 cm H2O and 90 th percentile EPAP 9 cm H2O.  ONO with CPAP and RA 04/20/15 >> test time 7 hrs 43 min.  Basal SpO2 92.7%, low SpO2 87%.  Spent 0.9 min with SpO2 < 88%.  Will have my nurse inform pt that CPAP report shows good control of sleep apnea, and overnight oxygen test was normal.  No change to CPAP set up needed, and she does not need supplemental oxygen at night.

## 2015-05-03 NOTE — Telephone Encounter (Signed)
She should not use breo anymore.  Please have her try sample of spiriva one puff daily.  If this is helpful, then she should call to get a prescription.

## 2015-05-03 NOTE — Telephone Encounter (Signed)
Called and spoke with pt Informed of rec of incruse Pt stated she would like to try sample Sample placed up front for pick up  Nothing further is needed

## 2015-05-03 NOTE — Telephone Encounter (Signed)
Called, spoke with pt.  Discussed below results and recs per Dr. Halford Chessman.  Pt verbalized understanding and is in agreement with plan.  Pt was started on Breo 1 puff qd. Pt states she notices breathing is more labored after using in a few days in a row.  After stopping the medication for a few days, breathing returns back to baseline.  Requesting further recs.  Dr. Halford Chessman, please advise.

## 2015-05-03 NOTE — Telephone Encounter (Signed)
Can try her on incruse instead.

## 2015-05-03 NOTE — Telephone Encounter (Signed)
Spoke with patient-states she has tried [place] in the past-both caused her breathing to worsen. Pt is aware to stop Breo as well. VS please advise. Thanks.

## 2015-05-06 ENCOUNTER — Telehealth: Payer: Self-pay | Admitting: Pulmonary Disease

## 2015-05-06 NOTE — Telephone Encounter (Signed)
Per 04/15/15 OV: Patient Instructions       Breo one puff daily >> rinse mouth after each use Will arrange for pulmonary function testing Will get copy of BiPAP download Will arrange for overnight oxygen test with BiPAP  Follow up in 6 to 8 weeks  ---  Called spoke with pt. She reports she was not given breo but was given incruse from last visit since she had a reaction to breo. She reports with the incruse she started having side effects: not able to urinate all day, eyes scratchy, runny nose, hands throbbing w/ pain. Pt was made aware to stop this medication and will send message over to Dr. Halford Chessman for further recs. Please advise thanks

## 2015-05-06 NOTE — Telephone Encounter (Signed)
Called made pt aware of below. She wants to call her insurance first to see how much this will be since she has been intolerant to other inhalers. Will await call back

## 2015-05-06 NOTE — Telephone Encounter (Signed)
Just have her use an albuterol inhaler two puffs q4 to 6 hrs prn for cough, wheeze, dyspnea, or chest congestion. She can get either ventolin or proair respiclick >> which ever is covered by her insurance.  She should stop incruse.

## 2015-05-09 ENCOUNTER — Ambulatory Visit: Payer: Commercial Managed Care - HMO | Admitting: Cardiovascular Disease

## 2015-05-09 NOTE — Telephone Encounter (Signed)
lmtcb x1 for pt. 

## 2015-05-10 DIAGNOSIS — M8588 Other specified disorders of bone density and structure, other site: Secondary | ICD-10-CM | POA: Diagnosis not present

## 2015-05-10 DIAGNOSIS — N958 Other specified menopausal and perimenopausal disorders: Secondary | ICD-10-CM | POA: Diagnosis not present

## 2015-05-10 DIAGNOSIS — Z1231 Encounter for screening mammogram for malignant neoplasm of breast: Secondary | ICD-10-CM | POA: Diagnosis not present

## 2015-05-10 NOTE — Telephone Encounter (Signed)
lmtcb X2 for pt.  

## 2015-05-16 ENCOUNTER — Telehealth: Payer: Self-pay | Admitting: Pulmonary Disease

## 2015-05-16 NOTE — Telephone Encounter (Signed)
Patient states that she will wait to discuss rescue inhalers with Dr. Halford Chessman at her next OV.  She said that she doesn't think that a rescue inhaler would benefit her. Advised patient that if she changes her mind to call us and we can send her in a prescription for a rescue inhaler.  Nothing further needed.

## 2015-05-16 NOTE — Telephone Encounter (Signed)
See TE dated 05/06/15 Closing this encounter Duplicate

## 2015-06-06 ENCOUNTER — Encounter: Payer: Self-pay | Admitting: Pulmonary Disease

## 2015-06-06 ENCOUNTER — Ambulatory Visit (INDEPENDENT_AMBULATORY_CARE_PROVIDER_SITE_OTHER): Payer: Commercial Managed Care - HMO | Admitting: Pulmonary Disease

## 2015-06-06 VITALS — BP 128/82 | HR 84 | Ht 59.0 in | Wt 153.0 lb

## 2015-06-06 DIAGNOSIS — G4733 Obstructive sleep apnea (adult) (pediatric): Secondary | ICD-10-CM

## 2015-06-06 DIAGNOSIS — R0600 Dyspnea, unspecified: Secondary | ICD-10-CM

## 2015-06-06 DIAGNOSIS — J439 Emphysema, unspecified: Secondary | ICD-10-CM | POA: Diagnosis not present

## 2015-06-06 LAB — PULMONARY FUNCTION TEST
DL/VA % PRED: 93 %
DL/VA: 3.8 ml/min/mmHg/L
DLCO unc % pred: 78 %
DLCO unc: 13.69 ml/min/mmHg
FEF 25-75 POST: 1.07 L/s
FEF 25-75 Pre: 1.31 L/sec
FEF2575-%Change-Post: -18 %
FEF2575-%PRED-POST: 137 %
FEF2575-%PRED-PRE: 168 %
FEV1-%CHANGE-POST: -4 %
FEV1-%PRED-PRE: 159 %
FEV1-%Pred-Post: 152 %
FEV1-PRE: 1.99 L
FEV1-Post: 1.91 L
FEV1FVC-%Change-Post: 0 %
FEV1FVC-%Pred-Pre: 102 %
FEV6-%Change-Post: -4 %
FEV6-%PRED-POST: 161 %
FEV6-%Pred-Pre: 169 %
FEV6-POST: 2.57 L
FEV6-PRE: 2.7 L
FEV6FVC-%CHANGE-POST: 0 %
FEV6FVC-%PRED-POST: 108 %
FEV6FVC-%PRED-PRE: 108 %
FVC-%Change-Post: -4 %
FVC-%PRED-POST: 149 %
FVC-%Pred-Pre: 157 %
FVC-POST: 2.57 L
FVC-Pre: 2.71 L
PRE FEV6/FVC RATIO: 100 %
Post FEV1/FVC ratio: 74 %
Post FEV6/FVC ratio: 100 %
Pre FEV1/FVC ratio: 74 %
RV % pred: 92 %
RV: 2.11 L
TLC % pred: 109 %
TLC: 4.69 L

## 2015-06-06 NOTE — Progress Notes (Signed)
PFT done today. 

## 2015-06-06 NOTE — Patient Instructions (Signed)
Follow up in 1 year.

## 2015-06-06 NOTE — Progress Notes (Signed)
Chief Complaint  Patient presents with  . Follow-up    Pulmonary Emphysema. Pt c/o continued dyspnea that increased with exertion. Pt denies cough/wheeze/CP/tightness.     History of Present Illness: Jo Hendrix is a 81 y.o. female former smoker with dyspnea, emphysema, and OSA on CPAP.  She is here to review her PFT.  This was essentially normal with only mild diffusion defect.  Her diffusion capacity actually has improved compared to 2013.  She has tried using inhalers.  These have made her breathing worse.  She has felt jitters after albuterol today with PFT.  She is not very active due to concern with her breathing.  She will get crampy feeling in her legs when she walks.   TESTS: PSG 04/07/07 >> AHI 21 CPST 01/28/12 >> pulmonary impairment  PFT 01/28/12 >> FEV1 1.77 (137%), FEV1% 75, DLCO 59%  ONO with BPAP and RA 03/27/12 >> Test time 8 hrs 4 min. Mean SpO2 93.4%, low SpO2 77%. Spent 10 min with SpO2 < 88%. Echo 04/07/15 >> EF 55 to 03%, grade 1 diastolic dysfx  Auto CPAP 03/20/15 to 04/18/15 >> used on 30 of 30 nights with average 4 hrs and 23 min. Average AHI is 3.1 with average EPAP 8 cm H2O and 90 th percentile EPAP 9 cm H2O. PFT 06/06/15 >> FEV1 1.99 (159%), FEV1% 74, TLC 4.69 (109%), DLCO 78%, no BD   PMHx >> HTN, diastolic dysfunction, CAD, OSA  PSHx, Medications, Allergies, Fhx, Shx reviewed.   Physical Exam: BP 128/82 mmHg  Pulse 84  Ht '4\' 11"'$  (1.499 m)  Wt 153 lb (69.4 kg)  BMI 30.89 kg/m2  SpO2 100%  General - No distress ENT - No sinus tenderness, MP 4, scalloped tongue, no oral exudate, no LAN Cardiac - s1s2 regular, no murmur Chest - No wheeze/rales/dullness Back - No focal tenderness Abd - Soft, non-tender Ext - No edema Neuro - Normal strength Skin - No rashes Psych - normal mood, and behavior   Discussion: She has progressive dyspnea on exertion.  Her pulmonary evaluation has been unrevealing, and her PFT shows improvement compared to 2013.  I  explained that her dyspnea might be related to cardiac disease, peripheral artery disease, or most likely deconditioning.  Assessment/Plan:  OSA on CPAP. She is compliant with CPAP and reports benefit from therapy. Plan: - get continue auto CPAP  Dyspnea. Plan: - if her cardiac assessment is unrevealing, then I have advised her to start a gradual exercise regimen   [name], MD [place] [facility]/Sleep Pager:  [phone] 06/06/2015, 12:36 PM

## 2015-06-07 ENCOUNTER — Telehealth: Payer: Self-pay | Admitting: Pulmonary Disease

## 2015-06-07 ENCOUNTER — Telehealth: Payer: Self-pay | Admitting: Cardiovascular Disease

## 2015-06-07 ENCOUNTER — Ambulatory Visit (INDEPENDENT_AMBULATORY_CARE_PROVIDER_SITE_OTHER): Payer: Commercial Managed Care - HMO | Admitting: Cardiovascular Disease

## 2015-06-07 ENCOUNTER — Encounter: Payer: Self-pay | Admitting: Pulmonary Disease

## 2015-06-07 ENCOUNTER — Encounter: Payer: Self-pay | Admitting: Cardiovascular Disease

## 2015-06-07 VITALS — BP 138/72 | HR 76 | Ht 60.0 in | Wt 153.0 lb

## 2015-06-07 DIAGNOSIS — G4737 Central sleep apnea in conditions classified elsewhere: Secondary | ICD-10-CM

## 2015-06-07 DIAGNOSIS — G4731 Primary central sleep apnea: Secondary | ICD-10-CM

## 2015-06-07 DIAGNOSIS — I2510 Atherosclerotic heart disease of native coronary artery without angina pectoris: Secondary | ICD-10-CM

## 2015-06-07 DIAGNOSIS — R0600 Dyspnea, unspecified: Secondary | ICD-10-CM

## 2015-06-07 DIAGNOSIS — E785 Hyperlipidemia, unspecified: Secondary | ICD-10-CM

## 2015-06-07 DIAGNOSIS — G4733 Obstructive sleep apnea (adult) (pediatric): Secondary | ICD-10-CM

## 2015-06-07 DIAGNOSIS — I10 Essential (primary) hypertension: Secondary | ICD-10-CM | POA: Diagnosis not present

## 2015-06-07 NOTE — Telephone Encounter (Signed)
Pt was here this morning to see Dr Claiborne Billings. She received some literature and she have some questions.

## 2015-06-07 NOTE — Telephone Encounter (Signed)
No message needed °

## 2015-06-07 NOTE — Patient Instructions (Signed)
Your physician wants you to follow-up in: 1 YEAR OR SOONER IF NEEDED. You will receive a reminder letter in the mail two months in advance. If you don't receive a letter, please call our office to schedule the follow-up appointment.   If you need a refill on your cardiac medications before your next appointment, please call your pharmacy.

## 2015-06-07 NOTE — Telephone Encounter (Signed)
Returned call to patient.She stated she saw Dr.Kelly this morning.Stated she was reading after visit summary and noticed aminophylline injection 75 mg under the medication list.Stated she has not taken that injection.Advised that medication was ordered by another physician at some point of her past care.

## 2015-06-09 NOTE — Progress Notes (Signed)
Patient ID: Jo Hendrix, female   DOB: 05-Jul-1927, 81 y.o.   MRN: [id number]     HPI: Jo Hendrix is a 81 y.o. female who presents to the office for an 2 monthcardiology evaluation.  Jo Hendrix has a long-standing history of hypertension with documented grade 1 diastolic dysfunction.  She also has a history of complex sleep apnea with documented mild pulmonary hypertension and has been on BiPAP. In October 2008 she underwent cardiac catheterization after developing recurrent episodes of chest Hendrix.  This showed mild coronary obstructive disease with 20% narrowing of the proximal LAD with mid LAD systolic bridging and diffuse luminal irregularities of 40-50% in the RCA.  She has had difficulty with shortness of breath and has been diagnosed with COPD/emphysema.  A cardiopulmonary met test  revealed normal functional status with peak oxygen consumption 105%, but she had an abnormal pulmonary response with no ventilation perfusion mismatch and with plus/minus dead space.  She has been intolerant to statin therapy as well as Zetia.  She continues to use BiPAP for her complex sleep apnea.  When I last saw her 2 months ago she had noticed progressive increasing shortness of breath with activity and had experienced some very mild chest tightness when she is short of breath.  She also admitted to some mild lower extremity edema particularly involving the left leg by the end of the day.     She underwent a nuclear stress test on 04/08/2015.  This was low risk.  Post-rest ejection fraction was 71%.  There was normal perfusion.  An echo Doppler study on 04/07/2015 showed an ejection fraction at 55-60%.  There was grade 1 diastolic dysfunction.  There was mild mitral annular calcification with trace MR and trace TR.  Recently, Jo Hendrix has felt well.  She denies recurrent symptomatology.  She presents for evaluation.   Past Medical History  Diagnosis Date  . Hypertension   . Mild pulmonary hypertension  ([place])   . Diastolic dysfunction   . ACE-inhibitor cough   . OSA (obstructive sleep apnea)     Sleep Study 04/07/2007 - AHI during total sleep 21.30/hr, during REM 20.16/hr - BiPAP auto servo-ventilation unit  . Mild CAD   . COPD (chronic obstructive pulmonary disease) ([place])   . History of tobacco use     smoked 25 years    Past Surgical History  Procedure Laterality Date  . Gallbladder surgery    . Benign lesion removed on lung  2000  . Vaginal hysterectomy    . Tonsillectomy    . Cataract extraction, bilateral    . Bladder repair    . Carpal tunnel release      x2  . Varicose vein surgery    . Transthoracic echocardiogram  2013    EF 50-55%; mild MR; mild TR; mild pulm valve regurg; aortic root sclerosis/calcification  . Cardiopulmonary met test  01/28/2012    with PFTs - FEV1 & FEV1/VC WNL, VC WNL, DLCO WNL; abnormal pulmonary response  . Nm myocar perf wall motion  09/2011    lexiscan myoview - normal perfusion, EF 77%, low risk  . Cardiac catheterization  05/20/2007    mild coronary obstructive disease with 20% narrowing in prox LAD, diffuse luminal irregularity of 40-50% in mid RCA (Dr. Corky Downs)  . Carotid doppler  04/2011    normal patency  . Renal doppler  05/2007    normal renal arteries     Allergies  Allergen Reactions  . Zetia [  Ezetimibe]     Chest pressure  . Ace Inhibitors Cough  . Codeine     difficulty urinating  . Erythromycin   . Ivp Dye [Iodinated Diagnostic Agents]     hives    Current Outpatient Prescriptions  Medication Sig Dispense Refill  . aspirin 81 MG tablet Take 81 mg by mouth daily.    . Calcium 1500 MG tablet Take 1,500 mg by mouth daily.    . cholecalciferol (VITAMIN D) 1000 UNITS tablet Take 1 tablet by mouth daily. Take 1 tab daily    . losartan-hydrochlorothiazide (HYZAAR) 50-12.5 MG per tablet TAKE 1 TABLET EVERY DAY 90 tablet 3  . NON FORMULARY at bedtime. BiPAP    . Omega-3 Fatty Acids (FISH OIL) 1000 MG CAPS Take 1 capsule by  mouth 3 (three) times daily.    . RESTASIS 0.05 % ophthalmic emulsion Place 1 drop into both eyes 2 (two) times daily. Use 1 drop in each eye twice a day     No current facility-administered medications for this visit.   Facility-Administered Medications Ordered in Other Visits  Medication Dose Route Frequency Provider Last Rate Last Dose  . aminophylline injection 75 mg  75 mg Intravenous TID PRN Larey Dresser, MD   75 mg at 04/07/15 1105    Social History   Social History  . Marital Status: Widowed    Spouse Name: N/A  . Number of Children: 3  . Years of Education: N/A   Occupational History  . retired    Social History Main Topics  . Smoking status: Former Smoker -- 0.50 packs/day for 25 years    Types: Cigarettes    Quit date: 08/13/1984  . Smokeless tobacco: Never Used  . Alcohol Use: No  . Drug Use: No  . Sexual Activity: Not on file   Other Topics Concern  . Not on file   Social History Narrative    Socially, she is widowed, has 3 children, 8 grandchildren 5 great-grandchildren.  Remotely she did smoke for over 30 years ago.  Family History  Problem Relation Age of Onset  . Lung cancer Mother   . Cancer Sister   . Breast cancer Daughter   . Suicidality Father   . Stroke Sister    ROS General: Negative; No fevers, chills, or night sweats;  HEENT: Negative; No changes in vision or hearing, sinus congestion, difficulty swallowing Pulmonary: Negative; No cough, wheezing, shortness of breath, hemoptysis Cardiovascular: See history of present illness Positive for mild shortness of breath with activity, unchanged. GI: Negative; No nausea, vomiting, diarrhea, or abdominal Hendrix GU: Negative; No dysuria, hematuria, or difficulty voiding Musculoskeletal: Negative; no myalgias, joint Hendrix, or weakness Hematologic/Oncology: Negative; no easy bruising, bleeding Endocrine: Negative; no heat/cold intolerance; no diabetes Neuro: Negative; no changes in balance,  headaches Skin: Negative; No rashes or skin lesions Psychiatric: Negative; No behavioral problems, depression Sleep: Positive for sleep apnea, on therapy; No snoring, daytime sleepiness, hypersomnolence, bruxism, restless legs, hypnogognic hallucinations, no cataplexy Other comprehensive 14 point system review is negative.   PE BP 138/72 mmHg  Pulse 76  Ht 5' (1.524 m)  Wt 153 lb (69.4 kg)  BMI 29.88 kg/m2  Wt Readings from Last 3 Encounters:  06/07/15 153 lb (69.4 kg)  06/06/15 153 lb (69.4 kg)  04/15/15 145 lb 9.6 oz (66.044 kg)   General: Alert, oriented, no distress , who appears younger than stated age. Skin: normal turgor, no rashes HEENT: Normocephalic, atraumatic. Pupils round and reactive; sclera  anicteric;no lid lag. Extraocular muscles intact; no xanthelasmas. Nose without nasal septal hypertrophy Mouth/Parynx benign; Mallinpatti scale 3 Neck: No JVD, no carotid bruits; normal carotid upstroke Lungs: clear to ausculatation and percussion; no wheezing or rales Chest wall: no tenderness to palpitation Heart: RRR, s1 s2 normal; 1/6 systolic murmur no diastolic murmur, rub thrills or heaves Abdomen: soft, nontender; no hepatosplenomehaly, BS+; abdominal aorta nontender and not dilated by palpation. Back: no CVA tenderness Pulses 2+ Extremities: Previous ankle edema has resolved; no clubbing cyanosis , Homan's sign negative  Neurologic: grossly nonfocal; cranial nerves grossly normal. Psychologic: normal affect and mood.  ECG (independently read by me): Normal sinus rhythm at 66 bpm.  Nonspecific T changes.  September 2015 ECG (independently read by me): Normal sinus rhythm at 62 beats per minute.  Poor progression anteriorly.  Nonspecific ST changes  12/03/2013 ECG (independently read by me): Normal sinus rhythm at 62 beats per minute.  Nonspecific ST changes.  No ectopy  LABS:  BMP Latest Ref Rng 03/30/2015 02/26/2014  Glucose 65 - 99 mg/dL 91 78  BUN 7 - 25 mg/dL 16  21  Creatinine 0.60 - 0.88 mg/dL 1.11(H) 1.23(H)  Sodium 135 - 146 mmol/L 138 138  Potassium 3.5 - 5.3 mmol/L 4.5 4.8  Chloride 98 - 110 mmol/L 101 101  CO2 20 - 31 mmol/L 27 29  Calcium 8.6 - 10.4 mg/dL 9.4 9.2   Hepatic Function Latest Ref Rng 03/30/2015 02/26/2014  Total Protein 6.1 - 8.1 g/dL 7.5 7.0  Albumin 3.6 - 5.1 g/dL 3.9 3.6  AST 10 - 35 U/L 16 15  ALT 6 - 29 U/L 14 13  Alk Phosphatase 33 - 130 U/L 72 72  Total Bilirubin 0.2 - 1.2 mg/dL 0.5 0.4   CBC Latest Ref Rng 03/30/2015  WBC 4.0 - 10.5 K/uL 8.3  Hemoglobin 12.0 - 15.0 g/dL 14.9  Hematocrit 36.0 - 46.0 % 43.1  Platelets 150 - 400 K/uL 324   Lab Results  Component Value Date   MCV 88.3 03/30/2015   Lab Results  Component Value Date   TSH 1.358 03/30/2015   No results found for: HGBA1C  Lipid Panel     Component Value Date/Time   CHOL 241* 03/30/2015 0803   TRIG 115 03/30/2015 0803   HDL 67 03/30/2015 0803   CHOLHDL 3.6 03/30/2015 0803   VLDL 23 03/30/2015 0803   LDLCALC 151* 03/30/2015 0803    RADIOLOGY: No results found.    ASSESSMENT AND PLAN: Jo Hendrix is a very pleasant 81 year old female who has a long-standing history of hypertension and most recently has been maintained on losartan HCT 50/12.5.  Her blood pressure today is stable.  She has a history of hyperlipidemia.  Her most recent laboratory in August 2016 continues to show elevated lipids with a total cholesterol 241, LDL cholesterol 151.  We discussed improved diet.  She had developed increasing shortness of breath with activity and has had mild associated chest tightness.  Her recent echo Doppler studies shows normal systolic function with mild grade 1 diastolic dysfunction, mitral annular calcification with trace MR and trace TR.  Her nuclear perfusion study is normal.  She has normal thyroid function studies on recent laboratory.  Since her mother died of lung CA, a chest x-ray was done after her last office visit on 03/30/2015.  The  lungs are mildly hyperinflated but clear.  There was advanced degenerative disc disease in the mid thoracic spine, but there was no evidence for acute  findings.  She continues to use BiPAP therapy for her complex sleep apnea and admits to 100% compliance.  She will continue her current medical regimen.  As long as she remains stable, I will see her in one year for reevaluation.   Time spent: 25 minutes  Troy Sine, MD, [facility]  06/09/2015 10:52 AM

## 2015-06-20 DIAGNOSIS — Z961 Presence of intraocular lens: Secondary | ICD-10-CM | POA: Diagnosis not present

## 2015-06-20 DIAGNOSIS — H18413 Arcus senilis, bilateral: Secondary | ICD-10-CM | POA: Diagnosis not present

## 2015-06-20 DIAGNOSIS — H04123 Dry eye syndrome of bilateral lacrimal glands: Secondary | ICD-10-CM | POA: Diagnosis not present

## 2015-06-28 DIAGNOSIS — M65342 Trigger finger, left ring finger: Secondary | ICD-10-CM | POA: Diagnosis not present

## 2015-08-12 DIAGNOSIS — G4733 Obstructive sleep apnea (adult) (pediatric): Secondary | ICD-10-CM | POA: Diagnosis not present

## 2015-08-12 DIAGNOSIS — Z0000 Encounter for general adult medical examination without abnormal findings: Secondary | ICD-10-CM | POA: Diagnosis not present

## 2015-08-12 DIAGNOSIS — I700 Atherosclerosis of aorta: Secondary | ICD-10-CM | POA: Diagnosis not present

## 2015-08-12 DIAGNOSIS — I2510 Atherosclerotic heart disease of native coronary artery without angina pectoris: Secondary | ICD-10-CM | POA: Diagnosis not present

## 2015-08-12 DIAGNOSIS — I519 Heart disease, unspecified: Secondary | ICD-10-CM | POA: Diagnosis not present

## 2015-08-12 DIAGNOSIS — E559 Vitamin D deficiency, unspecified: Secondary | ICD-10-CM | POA: Diagnosis not present

## 2015-08-12 DIAGNOSIS — M5090 Cervical disc disorder, unspecified, unspecified cervical region: Secondary | ICD-10-CM | POA: Diagnosis not present

## 2015-08-12 DIAGNOSIS — J439 Emphysema, unspecified: Secondary | ICD-10-CM | POA: Diagnosis not present

## 2015-09-29 DIAGNOSIS — G4733 Obstructive sleep apnea (adult) (pediatric): Secondary | ICD-10-CM | POA: Diagnosis not present

## 2015-10-28 DIAGNOSIS — M65342 Trigger finger, left ring finger: Secondary | ICD-10-CM | POA: Diagnosis not present

## 2015-11-08 DIAGNOSIS — M65342 Trigger finger, left ring finger: Secondary | ICD-10-CM | POA: Diagnosis not present

## 2015-12-16 DIAGNOSIS — S43429A Sprain of unspecified rotator cuff capsule, initial encounter: Secondary | ICD-10-CM | POA: Diagnosis not present

## 2015-12-30 ENCOUNTER — Other Ambulatory Visit: Payer: Self-pay | Admitting: Cardiovascular Disease

## 2015-12-30 NOTE — Telephone Encounter (Signed)
Rx(s) sent to pharmacy electronically.  

## 2016-01-26 DIAGNOSIS — M65342 Trigger finger, left ring finger: Secondary | ICD-10-CM | POA: Diagnosis not present

## 2016-01-27 DIAGNOSIS — Z01419 Encounter for gynecological examination (general) (routine) without abnormal findings: Secondary | ICD-10-CM | POA: Diagnosis not present

## 2016-01-27 DIAGNOSIS — Z6832 Body mass index (BMI) 32.0-32.9, adult: Secondary | ICD-10-CM | POA: Diagnosis not present

## 2016-01-27 DIAGNOSIS — R3911 Hesitancy of micturition: Secondary | ICD-10-CM | POA: Diagnosis not present

## 2016-02-22 DIAGNOSIS — M67912 Unspecified disorder of synovium and tendon, left shoulder: Secondary | ICD-10-CM | POA: Diagnosis not present

## 2016-03-28 DIAGNOSIS — G4733 Obstructive sleep apnea (adult) (pediatric): Secondary | ICD-10-CM | POA: Diagnosis not present

## 2016-04-11 DIAGNOSIS — N390 Urinary tract infection, site not specified: Secondary | ICD-10-CM | POA: Diagnosis not present

## 2016-04-12 DIAGNOSIS — R3911 Hesitancy of micturition: Secondary | ICD-10-CM | POA: Diagnosis not present

## 2016-04-12 DIAGNOSIS — F419 Anxiety disorder, unspecified: Secondary | ICD-10-CM | POA: Diagnosis not present

## 2016-04-12 DIAGNOSIS — N390 Urinary tract infection, site not specified: Secondary | ICD-10-CM | POA: Diagnosis not present

## 2016-05-18 DIAGNOSIS — Z1231 Encounter for screening mammogram for malignant neoplasm of breast: Secondary | ICD-10-CM | POA: Diagnosis not present

## 2016-06-05 ENCOUNTER — Ambulatory Visit: Payer: Commercial Managed Care - HMO | Admitting: Pulmonary Disease

## 2016-06-08 ENCOUNTER — Telehealth: Payer: Self-pay | Admitting: Cardiovascular Disease

## 2016-06-08 ENCOUNTER — Ambulatory Visit (INDEPENDENT_AMBULATORY_CARE_PROVIDER_SITE_OTHER): Payer: Commercial Managed Care - HMO | Admitting: Cardiovascular Disease

## 2016-06-08 ENCOUNTER — Encounter: Payer: Self-pay | Admitting: Cardiovascular Disease

## 2016-06-08 VITALS — BP 171/79 | HR 66 | Ht 60.0 in | Wt 152.0 lb

## 2016-06-08 DIAGNOSIS — I2510 Atherosclerotic heart disease of native coronary artery without angina pectoris: Secondary | ICD-10-CM | POA: Diagnosis not present

## 2016-06-08 DIAGNOSIS — G4731 Primary central sleep apnea: Secondary | ICD-10-CM

## 2016-06-08 DIAGNOSIS — E784 Other hyperlipidemia: Secondary | ICD-10-CM | POA: Diagnosis not present

## 2016-06-08 DIAGNOSIS — I10 Essential (primary) hypertension: Secondary | ICD-10-CM | POA: Diagnosis not present

## 2016-06-08 DIAGNOSIS — E7849 Other hyperlipidemia: Secondary | ICD-10-CM

## 2016-06-08 DIAGNOSIS — Z79899 Other long term (current) drug therapy: Secondary | ICD-10-CM | POA: Diagnosis not present

## 2016-06-08 DIAGNOSIS — R0600 Dyspnea, unspecified: Secondary | ICD-10-CM

## 2016-06-08 DIAGNOSIS — E7800 Pure hypercholesterolemia, unspecified: Secondary | ICD-10-CM

## 2016-06-08 MED ORDER — VALSARTAN-HYDROCHLOROTHIAZIDE 160-12.5 MG PO TABS: 1.0000 | ORAL_TABLET | Freq: Every day | ORAL | 1 refills | Status: DC

## 2016-06-08 MED ORDER — VALSARTAN-HYDROCHLOROTHIAZIDE 160-12.5 MG PO TABS: 1.0000 | ORAL_TABLET | Freq: Every day | ORAL | 6 refills | Status: DC

## 2016-06-08 NOTE — Patient Instructions (Signed)
Your physician has recommended you make the following change in your medication:   1.) STOP the losartan-HCT. This has been replaced with Valsartan-HCT. A new prescription has been sent to your pharmacy to reflect this change.   Your physician recommends that you return for lab work FASTING.  Your physician recommends that you schedule a follow-up appointment in: 4 months

## 2016-06-08 NOTE — Telephone Encounter (Signed)
Spoke with pt states that she was seen today and would like new rx sent to mail in pharmacy instead of local. Informed pt to pick up at local (so she can start right away)and will send a 90day to mail in.

## 2016-06-08 NOTE — Telephone Encounter (Signed)
New message     Patient wants to prescription sent to mail order verse local pharmacy .

## 2016-06-10 NOTE — Progress Notes (Signed)
Patient ID: Jo Hendrix, female   DOB: November 26, 1926, 82 y.o.   MRN: [id number]    PCP: Dr. Jonathon Jordan  HPI: Jo Hendrix is a 82 y.o. female who presents to the office for a one year cardiology evaluation.  Jo Hendrix has a long-standing history of hypertension with documented grade 1 diastolic dysfunction.  She also has a history of complex sleep apnea with documented mild pulmonary hypertension and has been on BiPAP. In October 2008 she underwent cardiac catheterization after developing recurrent episodes of chest pain.  This showed mild coronary obstructive disease with 20% narrowing of the proximal LAD with mid LAD systolic bridging and diffuse luminal irregularities of 40-50% in the RCA.  She has had difficulty with shortness of breath and has been diagnosed with COPD/emphysema.  A cardiopulmonary met test  revealed normal functional status with peak oxygen consumption 105%, but she had an abnormal pulmonary response with no ventilation perfusion mismatch and with plus/minus dead space.  She has been intolerant to statin therapy as well as Zetia.  She continues to use BiPAP for her complex sleep apnea.  When I last saw her 2 months ago she had noticed progressive increasing shortness of breath with activity and had experienced some very mild chest tightness when she is short of breath.  She also admitted to some mild lower extremity edema particularly involving the left leg by the end of the day.    Aa nuclear stress test on 04/08/2015 was low risk.  Post-rest ejection fraction was 71%.  There was normal perfusion.  An echo Doppler study on 04/07/2015 showed an ejection fraction at 55-60%.  There was grade 1 diastolic dysfunction.  There was mild mitral annular calcification with trace MR and trace TR.  Since I last saw her, she denies any episodes of chest pain.  Her major complaint is that she is tired and has no energy.  She continues to use her BiPAP for previously documented complex sleep  apnea..  She has not had a recent download.  Her blood pressure has been elevated.  She has not had recent blood work.  In the past.  She did not tolerate daily statin therapy for hyperlipidemia.  She denies recurrent symptomatology.  She presents for evaluation.   Past Medical History:  Diagnosis Date  . ACE-inhibitor cough   . COPD (chronic obstructive pulmonary disease) ([place])   . Diastolic dysfunction   . History of tobacco use    smoked 25 years  . Hypertension   . Mild CAD   . Mild pulmonary hypertension   . OSA (obstructive sleep apnea)    Sleep Study 04/07/2007 - AHI during total sleep 21.30/hr, during REM 20.16/hr - BiPAP auto servo-ventilation unit    Past Surgical History:  Procedure Laterality Date  . benign lesion removed on lung  2000  . BLADDER REPAIR    . CARDIAC CATHETERIZATION  05/20/2007   mild coronary obstructive disease with 20% narrowing in prox LAD, diffuse luminal irregularity of 40-50% in mid RCA (Dr. Corky Downs)  . Cardiopulmonary Met Test  01/28/2012   with PFTs - FEV1 & FEV1/VC WNL, VC WNL, DLCO WNL; abnormal pulmonary response  . Carotid Doppler  04/2011   normal patency  . CARPAL TUNNEL RELEASE     x2  . CATARACT EXTRACTION, BILATERAL    . GALLBLADDER SURGERY    . NM MYOCAR PERF WALL MOTION  09/2011   lexiscan myoview - normal perfusion, EF 77%, low risk  .  Renal Doppler  05/2007   normal renal arteries   . TONSILLECTOMY    . TRANSTHORACIC ECHOCARDIOGRAM  2013   EF 50-55%; mild MR; mild TR; mild pulm valve regurg; aortic root sclerosis/calcification  . VAGINAL HYSTERECTOMY    . VARICOSE VEIN SURGERY      Allergies  Allergen Reactions  . Zetia [Ezetimibe] Other (See Comments)    Chest pressure  . Ace Inhibitors Cough  . Codeine Other (See Comments)    difficulty urinating  . Erythromycin Other (See Comments)    DOESN'T REMEMBER   . Ivp Dye [Iodinated Diagnostic Agents] Hives    hives    Current Outpatient Prescriptions  Medication Sig  Dispense Refill  . ALPRAZolam (XANAX) 0.25 MG tablet Take 1 tablet by mouth as needed.    Marland Kitchen aspirin 81 MG tablet Take 81 mg by mouth daily.    . Calcium 1500 MG tablet Take 1,500 mg by mouth daily.    . cholecalciferol (VITAMIN D) 1000 UNITS tablet Take 1 tablet by mouth daily. Take 1 tab daily    . NON FORMULARY at bedtime. BiPAP    . Omega-3 Fatty Acids (FISH OIL) 1000 MG CAPS Take 1 capsule by mouth 3 (three) times daily.    . RESTASIS 0.05 % ophthalmic emulsion Place 1 drop into both eyes 2 (two) times daily. Use 1 drop in each eye twice a day    . valsartan-hydrochlorothiazide (DIOVAN HCT) 160-12.5 MG tablet Take 1 tablet by mouth daily. 90 tablet 1   No current facility-administered medications for this visit.    Facility-Administered Medications Ordered in Other Visits  Medication Dose Route Frequency Provider Last Rate Last Dose  . aminophylline injection 75 mg  75 mg Intravenous TID PRN Larey Dresser, MD   75 mg at 04/07/15 1105    Social History   Social History  . Marital status: Widowed    Spouse name: N/A  . Number of children: 3  . Years of education: N/A   Occupational History  . retired    Social History Main Topics  . Smoking status: Former Smoker    Packs/day: 0.50    Years: 25.00    Types: Cigarettes    Quit date: 08/13/1984  . Smokeless tobacco: Never Used  . Alcohol use No  . Drug use: No  . Sexual activity: Not on file   Other Topics Concern  . Not on file   Social History Narrative  . No narrative on file    Socially, she is widowed, has 3 children, 8 grandchildren 5 great-grandchildren.  There is a remote tobacco history but she quit many years ago.  Family History  Problem Relation Age of Onset  . Lung cancer Mother   . Cancer Sister   . Breast cancer Daughter   . Suicidality Father   . Stroke Sister    ROS General: Negative; No fevers, chills, or night sweats;  HEENT: Negative; No changes in vision or hearing, sinus congestion,  difficulty swallowing Pulmonary: Negative; No cough, wheezing, shortness of breath, hemoptysis Cardiovascular: See history of present illness Positive for mild shortness of breath with activity, unchanged. GI: Negative; No nausea, vomiting, diarrhea, or abdominal pain GU: Negative; No dysuria, hematuria, or difficulty voiding Musculoskeletal: Negative; no myalgias, joint pain, or weakness Hematologic/Oncology: Negative; no easy bruising, bleeding Endocrine: Negative; no heat/cold intolerance; no diabetes Neuro: Negative; no changes in balance, headaches Skin: Negative; No rashes or skin lesions Psychiatric: Negative; No behavioral problems, depression Sleep: Positive for  sleep apnea, on therapy; No snoring, daytime sleepiness, hypersomnolence, bruxism, restless legs, hypnogognic hallucinations, no cataplexy Other comprehensive 14 point system review is negative.   PE BP (!) 171/79 (BP Location: Right Arm, Patient Position: Sitting)   Pulse 66   Ht 5' (1.524 m)   Wt 152 lb (68.9 kg)   SpO2 96%   BMI 29.69 kg/m    Repeat blood pressure by me was 162/76  Wt Readings from Last 3 Encounters:  06/08/16 152 lb (68.9 kg)  06/07/15 153 lb (69.4 kg)  06/06/15 153 lb (69.4 kg)   General: Alert, oriented, no distress , who appears younger than stated age. Skin: normal turgor, no rashes HEENT: Normocephalic, atraumatic. Pupils round and reactive; sclera anicteric;no lid lag. Extraocular muscles intact; no xanthelasmas. Nose without nasal septal hypertrophy Mouth/Parynx benign; Mallinpatti scale 3 Neck: No JVD, no carotid bruits; normal carotid upstroke Lungs: clear to ausculatation and percussion; no wheezing or rales Chest wall: no tenderness to palpitation Heart: RRR, s1 s2 normal; 1/6 systolic murmur no diastolic murmur, rub thrills or heaves Abdomen: soft, nontender; no hepatosplenomehaly, BS+; abdominal aorta nontender and not dilated by palpation. Back: no CVA tenderness Pulses  2+ Extremities: Previous ankle edema has resolved; no clubbing cyanosis , Homan's sign negative  Neurologic: grossly nonfocal; cranial nerves grossly normal. Psychologic: normal affect and mood.  ECG (independently read by me): Normal sinus rhythm at 66 bpm.  Poor R wave progression.  Normal intervals.  October 2016 ECG (independently read by me): Normal sinus rhythm at 66 bpm.  Nonspecific T changes.  September 2015 ECG (independently read by me): Normal sinus rhythm at 62 beats per minute.  Poor progression anteriorly.  Nonspecific ST changes  12/03/2013 ECG (independently read by me): Normal sinus rhythm at 62 beats per minute.  Nonspecific ST changes.  No ectopy  LABS:  BMP Latest Ref Rng & Units 03/30/2015 02/26/2014  Glucose 65 - 99 mg/dL 91 78  BUN 7 - 25 mg/dL 16 21  Creatinine 0.60 - 0.88 mg/dL 1.11(H) 1.23(H)  Sodium 135 - 146 mmol/L 138 138  Potassium 3.5 - 5.3 mmol/L 4.5 4.8  Chloride 98 - 110 mmol/L 101 101  CO2 20 - 31 mmol/L 27 29  Calcium 8.6 - 10.4 mg/dL 9.4 9.2   Hepatic Function Latest Ref Rng & Units 03/30/2015 02/26/2014  Total Protein 6.1 - 8.1 g/dL 7.5 7.0  Albumin 3.6 - 5.1 g/dL 3.9 3.6  AST 10 - 35 U/L 16 15  ALT 6 - 29 U/L 14 13  Alk Phosphatase 33 - 130 U/L 72 72  Total Bilirubin 0.2 - 1.2 mg/dL 0.5 0.4   CBC Latest Ref Rng & Units 03/30/2015  WBC 4.0 - 10.5 K/uL 8.3  Hemoglobin 12.0 - 15.0 g/dL 14.9  Hematocrit 36.0 - 46.0 % 43.1  Platelets 150 - 400 K/uL 324   Lab Results  Component Value Date   MCV 88.3 03/30/2015   Lab Results  Component Value Date   TSH 1.358 03/30/2015   No results found for: HGBA1C  Lipid Panel     Component Value Date/Time   CHOL 241 (H) 03/30/2015 0803   TRIG 115 03/30/2015 0803   HDL 67 03/30/2015 0803   CHOLHDL 3.6 03/30/2015 0803   VLDL 23 03/30/2015 0803   LDLCALC 151 (H) 03/30/2015 0803    RADIOLOGY: No results found.    ASSESSMENT AND PLAN: Jo Hendrix is a very pleasant , Young appearing  82 year old female who has a long-standing history  of hypertension and most recently has been maintained on losartan HCT 50/12.5.  Her blood pressure today is elevated.  There are no signs of edema.  Since Hyzaar does not come in a 100/12.5 mm dosage, I am suggesting that she change this to valsartan HCT 160/12.5 mg.  Hopefully this will optimize her blood pressure, but if not, further titration to 320/12.5 mg He done in the future.  Jo Hendrix today is that of fatigability.  She has complex sleep apnea and continues to use BiPAP with 100% compliance.  She has not had a recent download.  Her DME company.  His apnea.  I will request that a download be obtained to make certain she does not require any pressure regulation.  She has a history of hyperlipidemia.  Her most recent laboratory in August 2016 continued to show elevated lipids with a total cholesterol 241, LDL cholesterol 151.  I last saw her, we discussed importance of diet regulation.  Fasting blood work will be obtained.  If her lipid studies are still elevated  I have discussed with her the possibility of adding very low dose Crestor and take 5 mg once a week.  He will be able to tolerate this low dose and if so possibly this may be able to be titrated to twice a week.  Last year she had developed increasing shortness of breath with activity and has had mild associated chest tightness.  Her echo Doppler studies showed normal systolic function with mild grade 1 diastolic dysfunction, mitral annular calcification with trace MR and trace TR.  Her nuclear perfusion study was normal.  She has normal thyroid function studies on recent laboratory.  Since her mother died of lung CA, a chest x-ray was done on 03/30/2015.  The lungs are mildly hyperinflated but clear.  There was advanced degenerative disc disease in the mid thoracic spine, but there was no evidence for acute findings.  I will see her in 4 months for reevaluation. Time spent: 25 minutes  Troy Sine, MD, [facility]  06/10/2016 10:28 AM

## 2016-06-11 DIAGNOSIS — Z79899 Other long term (current) drug therapy: Secondary | ICD-10-CM | POA: Diagnosis not present

## 2016-06-11 DIAGNOSIS — E784 Other hyperlipidemia: Secondary | ICD-10-CM | POA: Diagnosis not present

## 2016-06-11 DIAGNOSIS — I10 Essential (primary) hypertension: Secondary | ICD-10-CM | POA: Diagnosis not present

## 2016-06-11 LAB — CBC
HCT: 41.1 % (ref 35.0–45.0)
Hemoglobin: 13.9 g/dL (ref 11.7–15.5)
MCH: 30.3 pg (ref 27.0–33.0)
MCHC: 33.8 g/dL (ref 32.0–36.0)
MCV: 89.7 fL (ref 80.0–100.0)
MPV: 10.3 fL (ref 7.5–12.5)
PLATELETS: 283 10*3/uL (ref 140–400)
RBC: 4.58 MIL/uL (ref 3.80–5.10)
RDW: 14.7 % (ref 11.0–15.0)
WBC: 8.4 10*3/uL (ref 3.8–10.8)

## 2016-06-12 LAB — COMPREHENSIVE METABOLIC PANEL
ALT: 14 U/L (ref 6–29)
AST: 15 U/L (ref 10–35)
Albumin: 3.9 g/dL (ref 3.6–5.1)
Alkaline Phosphatase: 66 U/L (ref 33–130)
BUN: 24 mg/dL (ref 7–25)
CHLORIDE: 102 mmol/L (ref 98–110)
CO2: 27 mmol/L (ref 20–31)
CREATININE: 1.35 mg/dL — AB (ref 0.60–0.88)
Calcium: 9.7 mg/dL (ref 8.6–10.4)
GLUCOSE: 86 mg/dL (ref 65–99)
Potassium: 4.2 mmol/L (ref 3.5–5.3)
SODIUM: 139 mmol/L (ref 135–146)
Total Bilirubin: 0.5 mg/dL (ref 0.2–1.2)
Total Protein: 7.3 g/dL (ref 6.1–8.1)

## 2016-06-12 LAB — LIPID PANEL
Cholesterol: 216 mg/dL — ABNORMAL HIGH (ref 125–200)
HDL: 71 mg/dL (ref >=46)
LDL CALC: 121 mg/dL (ref <130)
Total CHOL/HDL Ratio: 3 Ratio (ref <=5.0)
Triglycerides: 121 mg/dL (ref <150)
VLDL: 24 mg/dL (ref <30)

## 2016-06-12 LAB — TSH: TSH: 0.85 mIU/L

## 2016-06-19 ENCOUNTER — Telehealth: Payer: Self-pay | Admitting: Cardiovascular Disease

## 2016-06-19 ENCOUNTER — Encounter: Payer: Self-pay | Admitting: Pulmonary Disease

## 2016-06-19 ENCOUNTER — Ambulatory Visit (INDEPENDENT_AMBULATORY_CARE_PROVIDER_SITE_OTHER): Payer: Commercial Managed Care - HMO | Admitting: Pulmonary Disease

## 2016-06-19 VITALS — BP 138/86 | HR 78 | Ht 60.0 in | Wt 154.0 lb

## 2016-06-19 DIAGNOSIS — R0602 Shortness of breath: Secondary | ICD-10-CM | POA: Diagnosis not present

## 2016-06-19 NOTE — Patient Instructions (Signed)
Follow up as needed

## 2016-06-19 NOTE — Progress Notes (Signed)
Current Outpatient Prescriptions on File Prior to Visit  Medication Sig  . ALPRAZolam (XANAX) 0.25 MG tablet Take 1 tablet by mouth as needed.  Marland Kitchen aspirin 81 MG tablet Take 81 mg by mouth daily.  . Calcium 1500 MG tablet Take 1,500 mg by mouth daily.  . cholecalciferol (VITAMIN D) 1000 UNITS tablet Take 1 tablet by mouth daily. Take 1 tab daily  . NON FORMULARY at bedtime. BiPAP  . Omega-3 Fatty Acids (FISH OIL) 1000 MG CAPS Take 1 capsule by mouth 3 (three) times daily.  . RESTASIS 0.05 % ophthalmic emulsion Place 1 drop into both eyes 2 (two) times daily. Use 1 drop in each eye twice a day  . valsartan-hydrochlorothiazide (DIOVAN HCT) 160-12.5 MG tablet Take 1 tablet by mouth daily.   Current Facility-Administered Medications on File Prior to Visit  Medication  . aminophylline injection 75 mg    Chief Complaint  Patient presents with  . Follow-up    Pt states that SOB has been okay recently, no new complaints. Wears CPAP nightly, denies problems with mask or pressure. DME: [employer].      Sleep tests PSG 04/07/07 >> AHI 21 ONO with BPAP and RA 03/27/12 >> Test time 8 hrs 4 min. Mean SpO2 93.4%, low SpO2 77%. Spent 10 min with SpO2 < 88%. Auto CPAP 03/20/15 to 04/18/15 >> used on 30 of 30 nights with average 4 hrs and 23 min. Average AHI is 3.1 with average EPAP 8 cm H2O and 90 th percentile EPAP 9 cm H2O.  Pulmonary tests CPST 01/28/12 >> pulmonary impairment  PFT 01/28/12 >> FEV1 1.77 (137%), FEV1% 75, DLCO 59%  PFT 06/06/15 >> FEV1 1.99 (159%), FEV1% 74, TLC 4.69 (109%), DLCO 78%, no BD  Cardiac tests Echo 04/07/15 >> EF 55 to 45%, grade 1 diastolic dysfx   Past medical history HTN, diastolic dysfunction, CAD, ACE cough, OSA  Past surgical history, Family history, Social history, Allergies reviewed  Vital signs BP 138/86 (BP Location: Right Arm, Cuff Size: Normal)   Pulse 78   Ht 5' (1.524 m)   Wt 154 lb (69.9 kg)   SpO2 93%   BMI 30.08 kg/m   History of Present  Illness: Jo Hendrix is a 82 y.o. female former smoker with dyspnea.  Her breathing symptoms have been stable.  She can keep up with activity w/o difficulty if she goes a steady pace.  She recovers quickly after she sits and rests.  She denies cough, wheeze, sputum, chest pain, palpitations, or leg swelling.  She had her blood pressure medicines recently adjusted, and her blood pressure is better today.  Physical Exam:  General - No distress ENT - No sinus tenderness, MP 4, scalloped tongue, no oral exudate, no LAN Cardiac - s1s2 regular, no murmur Chest - No wheeze/rales/dullness Back - No focal tenderness Abd - Soft, non-tender Ext - No edema Neuro - Normal strength Skin - No rashes Psych - normal mood, and behavior   Assessment/Plan:  Dyspnea. - she has changes suggestive of emphysema on imaging studies, but recent PFT findings have been unremarkable and she did not notice benefit from trial of inhaler therapy - likely from diastolic dysfunction and deconditioning - no additional pulmonary testing needed at this time - she can call to schedule follow up if her symptoms progress  Obstructive sleep apnea. - she is followed by Dr. Claiborne Billings with Cardiology for this   Patient Instructions  Follow up as needed   [name], MD [place]  Pulmonary/Critical Care/Sleep Pager:  [phone] 06/19/2016, 10:07 AM

## 2016-06-19 NOTE — Telephone Encounter (Signed)
New message      Pt does not have a modem for a download only a card.  They have left several messages for her to call them but she has not called. That is why there is no updated download.

## 2016-06-21 ENCOUNTER — Telehealth: Payer: Self-pay | Admitting: *Deleted

## 2016-06-21 NOTE — Telephone Encounter (Signed)
Download received. Message left on patient's home vm.

## 2016-06-21 NOTE — Telephone Encounter (Signed)
Called and left message that we received her BIPAP download from [place]. Dr Claiborne Billings reviewed it and reports that it is good. Nothing indicating that her therapy is not working. Call if she has any further concerns.

## 2016-06-27 ENCOUNTER — Encounter: Payer: Self-pay | Admitting: Cardiovascular Disease

## 2016-06-29 ENCOUNTER — Telehealth: Payer: Self-pay | Admitting: Cardiovascular Disease

## 2016-06-29 MED ORDER — ROSUVASTATIN CALCIUM 5 MG PO TABS: 5.0000 mg | ORAL_TABLET | ORAL | 12 refills | Status: DC

## 2016-06-29 NOTE — Telephone Encounter (Signed)
New Message  Pt voiced has lab appt and has not heard back since her appt.  Please f/u with pt

## 2016-06-29 NOTE — Telephone Encounter (Signed)
Spoke with pt, aware of lab results and dr [facility] recommendations. New script sent to the pharmacy

## 2016-07-09 ENCOUNTER — Ambulatory Visit (INDEPENDENT_AMBULATORY_CARE_PROVIDER_SITE_OTHER): Payer: Commercial Managed Care - HMO | Admitting: Physician Assistant

## 2016-07-09 ENCOUNTER — Telehealth: Payer: Self-pay | Admitting: Cardiovascular Disease

## 2016-07-09 ENCOUNTER — Encounter: Payer: Self-pay | Admitting: Physician Assistant

## 2016-07-09 VITALS — BP 152/80 | HR 76 | Ht 60.0 in | Wt 154.0 lb

## 2016-07-09 DIAGNOSIS — I10 Essential (primary) hypertension: Secondary | ICD-10-CM

## 2016-07-09 DIAGNOSIS — I2510 Atherosclerotic heart disease of native coronary artery without angina pectoris: Secondary | ICD-10-CM | POA: Diagnosis not present

## 2016-07-09 DIAGNOSIS — E785 Hyperlipidemia, unspecified: Secondary | ICD-10-CM

## 2016-07-09 DIAGNOSIS — J439 Emphysema, unspecified: Secondary | ICD-10-CM

## 2016-07-09 DIAGNOSIS — R0609 Other forms of dyspnea: Secondary | ICD-10-CM

## 2016-07-09 MED ORDER — VALSARTAN-HYDROCHLOROTHIAZIDE 320-12.5 MG PO TABS: 1.0000 | ORAL_TABLET | Freq: Every day | ORAL | 1 refills | Status: DC

## 2016-07-09 MED ORDER — ROSUVASTATIN CALCIUM 5 MG PO TABS: 5.0000 mg | ORAL_TABLET | ORAL | 12 refills | Status: DC

## 2016-07-09 NOTE — Patient Instructions (Addendum)
Medication Changes:  Continue on current dose of Crestor for 2 more weeks, if no muscle ache, would recommend increase frequency of Crestor to twice a week every Wednesday and Sunday.   Increase Valsartan-HCTZ to 320-12.5 mg daily.   Follow-Up:  Your physician recommends that you follow-up on 10/05/15 with Dr. Claiborne Billings as scheduled.

## 2016-07-09 NOTE — Telephone Encounter (Signed)
I have seen this patient and made adjustment. Thank you.

## 2016-07-09 NOTE — Telephone Encounter (Signed)
Pt c/o Shortness Of Breath: STAT if SOB developed within the last 24 hours or pt is noticeably SOB on the phone  1. Are you currently SOB (can you hear that pt is SOB on the phone)? yes  2. How long have you been experiencing SOB?right before thanksgiving   3. Are you SOB when sitting or when up moving around? Mostly when moving around   4. Are you currently experiencing any other symptoms? High Blood pressure  Today reading 5:30am 171/120

## 2016-07-09 NOTE — Progress Notes (Signed)
Cardiology Office Note    Date:  07/09/2016   ID:  Jo Hendrix, DOB [birth year], MRN [id number]  PCP:  Lilian Coma, MD  Cardiologist:  Dr. Claiborne Billings  Chief Complaint  Patient presents with  . Follow-up    seen for Dr. Claiborne Billings, SOB and BP problem  . Dizziness    History of Present Illness:  Jo Hendrix is a 82 y.o. female COPD, HTN, OSA, HLD, and mild CAD. She has long-standing history of hypertension with grade 1 diastolic dysfunction. She also has history of complex sleep apnea was documented mild pulmonary hypertension and has been on BiPAP. In October 2008, she underwent cardiac catheterization after developing recurrent chest pain, cardiac cath showed mild coronary disease with 20% narrowing in proximal LAD with mid LAD systolic bridging, diffuse luminal irregularities of 40-50% in the RCA. She was diagnosed with COPD/emphysema, cardiopulmonary stress test revealed normal functional status with peak oxygen consumption 105%, but she had an abnormal pulmonary response with no dilation perfusion mismatch. A nuclear stress test in August 2016 was low risk with ejection fraction of 71%. Last echocardiogram obtained in the same month showed EF 12-87%, grade 1 diastolic dysfunction, mild mitral annular calcification with trace MR and trace TR. She has been intolerant to ACE inhibitor, statin therapy and Zetia.   Her last office visit on 06/08/2016, her blood pressure was elevated her Hyzaar was changed to valsartan HCT 160/12.5 mg. Her cholesterol was also uncontrolled with total cholesterol 216, triglycerides 121, HDL 71, LDL 121, Crestor 5 mg once a week was added. Otherwise patient has been followed by Dr. Halford Chessman for her shortness of breath, per Dr. Juanetta Gosling last note on 06/19/16, she has some changes suggestive of emphysema on imaging study, however PFT finding was unremarkable and that she did not notice benefit from a trial of inhaler therapy. Her dyspnea was attributed to diastolic dysfunction  and deconditioning. Dr. Claiborne Billings has reviewed her most recent BiPAP download on 06/21/2016 and felt it was good.  Patient present today for cardiology evaluation. She denies any chest discomfort, lower extremity edema, orthopnea or PND episode. She has been having exertional dyspnea for several years now, however notice some more recently. She does not do any strenuous activity, the most activity she does is going to the grocery store. Her blood pressure continued to be elevated ranging between 140s to 170s. She has been noting increased shortness of breath recently. She denies any shortness of breath at rest. She denies any ipsilateral swelling nor has she ever had any blood clot history. She has started on Crestor and just took her second dose yesterday.     Past Medical History:  Diagnosis Date  . ACE-inhibitor cough   . COPD (chronic obstructive pulmonary disease) ([place])   . Diastolic dysfunction   . History of tobacco use    smoked 25 years  . Hypertension   . Mild CAD   . Mild pulmonary hypertension   . OSA (obstructive sleep apnea)    Sleep Study 04/07/2007 - AHI during total sleep 21.30/hr, during REM 20.16/hr - BiPAP auto servo-ventilation unit    Past Surgical History:  Procedure Laterality Date  . benign lesion removed on lung  2000  . BLADDER REPAIR    . CARDIAC CATHETERIZATION  05/20/2007   mild coronary obstructive disease with 20% narrowing in prox LAD, diffuse luminal irregularity of 40-50% in mid RCA (Dr. Corky Downs)  . Cardiopulmonary Met Test  01/28/2012   with PFTs - FEV1 &  FEV1/VC WNL, VC WNL, DLCO WNL; abnormal pulmonary response  . Carotid Doppler  04/2011   normal patency  . CARPAL TUNNEL RELEASE     x2  . CATARACT EXTRACTION, BILATERAL    . GALLBLADDER SURGERY    . NM MYOCAR PERF WALL MOTION  09/2011   lexiscan myoview - normal perfusion, EF 77%, low risk  . Renal Doppler  05/2007   normal renal arteries   . TONSILLECTOMY    . TRANSTHORACIC ECHOCARDIOGRAM  2013    EF 50-55%; mild MR; mild TR; mild pulm valve regurg; aortic root sclerosis/calcification  . VAGINAL HYSTERECTOMY    . VARICOSE VEIN SURGERY      Current Medications: Outpatient Medications Prior to Visit  Medication Sig Dispense Refill  . ALPRAZolam (XANAX) 0.25 MG tablet Take 1 tablet by mouth as needed.    Marland Kitchen aspirin 81 MG tablet Take 81 mg by mouth daily.    . Calcium 1500 MG tablet Take 1,500 mg by mouth daily.    . cholecalciferol (VITAMIN D) 1000 UNITS tablet Take 1 tablet by mouth daily. Take 1 tab daily    . NON FORMULARY at bedtime. BiPAP    . Omega-3 Fatty Acids (FISH OIL) 1000 MG CAPS Take 1 capsule by mouth 3 (three) times daily.    . RESTASIS 0.05 % ophthalmic emulsion Place 1 drop into both eyes 2 (two) times daily. Use 1 drop in each eye twice a day    . rosuvastatin (CRESTOR) 5 MG tablet Take 1 tablet (5 mg total) by mouth once a week. 6 tablet 12  . valsartan-hydrochlorothiazide (DIOVAN HCT) 160-12.5 MG tablet Take 1 tablet by mouth daily. 90 tablet 1   Facility-Administered Medications Prior to Visit  Medication Dose Route Frequency Provider Last Rate Last Dose  . aminophylline injection 75 mg  75 mg Intravenous TID PRN Larey Dresser, MD   75 mg at 04/07/15 1105     Allergies:   Zetia [ezetimibe]; Ace inhibitors; Codeine; Erythromycin; and Ivp dye [iodinated diagnostic agents]   Social History   Social History  . Marital status: Widowed    Spouse name: N/A  . Number of children: 3  . Years of education: N/A   Occupational History  . retired    Social History Main Topics  . Smoking status: Former Smoker    Packs/day: 0.50    Years: 25.00    Types: Cigarettes    Quit date: 08/13/1984  . Smokeless tobacco: Never Used  . Alcohol use No  . Drug use: No  . Sexual activity: Not Asked   Other Topics Concern  . None   Social History Narrative  . None     Family History:  The patient's family history includes Breast cancer in her daughter; Cancer in her  sister; Lung cancer in her mother; Stroke in her sister; Suicidality in her father.   ROS:   Please see the history of present illness.    ROS All other systems reviewed and are negative.   PHYSICAL EXAM:   VS:  BP (!) 152/80   Pulse 76   Ht 5' (1.524 m)   Wt 154 lb (69.9 kg)   BMI 30.08 kg/m    GEN: Well nourished, well developed, in no acute distress  HEENT: normal  Neck: no JVD, carotid bruits, or masses Cardiac: RRR; no murmurs, rubs, or gallops,no edema  Respiratory:  clear to auscultation bilaterally, normal work of breathing. Largely clear to auscultation, did have intermittent bibasilar crackle  GI: soft, nontender, nondistended, + BS MS: no deformity or atrophy  Skin: warm and dry, no rash Neuro:  Alert and Oriented x 3, Strength and sensation are intact Psych: euthymic mood, full affect  Wt Readings from Last 3 Encounters:  07/09/16 154 lb (69.9 kg)  06/19/16 154 lb (69.9 kg)  06/08/16 152 lb (68.9 kg)      Studies/Labs Reviewed:   EKG:  EKG is not ordered today.   Recent Labs: 06/11/2016: ALT 14; BUN 24; Creat 1.35; Hemoglobin 13.9; Platelets 283; Potassium 4.2; Sodium 139; TSH 0.85   Lipid Panel    Component Value Date/Time   CHOL 216 (H) 06/11/2016 0827   TRIG 121 06/11/2016 0827   HDL 71 06/11/2016 0827   CHOLHDL 3.0 06/11/2016 0827   VLDL 24 06/11/2016 0827   LDLCALC 121 06/11/2016 0827    Additional studies/ records that were reviewed today include:   Echo 04/07/2015 LV EF: 55% -   60%  - Left ventricle: The cavity size was normal. Wall thickness was   normal. Systolic function was normal. The estimated ejection   fraction was in the range of 55% to 60%. Wall motion was normal;   there were no regional wall motion abnormalities. Doppler   parameters are consistent with abnormal left ventricular   relaxation (grade 1 diastolic dysfunction). - Mitral valve: Calcified annulus.  Impressions:  - Normal LV systolic function; grade 1 diastolic  dysfunction; trace   MR; mild TR.     Myoview 04/07/2015 Study Highlights    Nuclear stress EF: 71%.  The study is normal.  This is a low risk study.  The left ventricular ejection fraction is hyperdynamic (>65%).      PFT 06/06/2015 Interpretation: Supranormal results on spirometry and lung volumes likely related to difficulty finding age matched normal values. Mild diffusion defect. Diffusion capacity has improved significantly since June 2013.    ASSESSMENT:    1. DOE (dyspnea on exertion)   2. Essential hypertension   3. Hyperlipidemia, unspecified hyperlipidemia type   4. Coronary artery disease involving native coronary artery of native heart without angina pectoris   5. Pulmonary emphysema, unspecified emphysema type ([place])      PLAN:  In order of problems listed above:  1. Chronic DOE  - She has had dyspnea on exertion for several years, on physical exam, she has bibasilar mild intermittent crackle. She does not appears to be fluid overloaded (low LE edema or JVD), the crackle is not as fine as those heard in pulmonary fibrosis, I think the crackle likely represent atelectasis. I encouraged her to continue exercise as tolerated. She denies any chest pain. Her EKG from a month ago showed diffuse low voltage similar to those seen a year ago, she does not have any obvious heart murmur on physical exam. If continued to have shortness of breath, I would recommend a repeat echocardiogram first. Relatively low suspicion for PE, no tachycardia or ipsilateral LE swelling or pain.  2. Uncontrolled HTN: Her blood pressure remaining elevated, will change her blood pressure medication to losartan HCTZ 320/12.5 mg daily.  3. HLD: Her total cholesterol was mildly elevated recently, she had a history of intolerance to statin and a CT, she was started on Crestor 5 mg weekly and the has just taken her second dose yesterday. We'll continue on the current dose for 2 more weeks, if she  does not have any muscle ache, I have recommended for her to increase Crestor to twice a week  every Wednesday and Sunday.  4. Mild CAD: cardiac cath 2008 showed mild coronary disease with 20% narrowing in proximal LAD with mid LAD systolic bridging, diffuse luminal irregularities of 40-50% in the RCA  5. COPD: Per patient's pulmonologist, although initially imaging suggested emphysema, however pulmonary function test was unremarkable. It was felt that her shortness of breath is likely related to deconditioning or diastolic dysfunction.     Medication Adjustments/Labs and Tests Ordered: Current medicines are reviewed at length with the patient today.  Concerns regarding medicines are outlined above.  Medication changes, Labs and Tests ordered today are listed in the Patient Instructions below. Patient Instructions  Medication Changes:  Continue on current dose of Crestor for 2 more weeks, if no muscle ache, would recommend increase frequency of Crestor to twice a week every Wednesday and Sunday.   Increase Valsartan-HCTZ to 320-12.5 mg daily.   Follow-Up:  Your physician recommends that you follow-up on 10/05/15 with Dr. Claiborne Billings as scheduled.     Hilbert Corrigan, [place]  07/09/2016 10:17 AM    [address], [place], [place]  [address] Phone: [phone]; Fax: [phone]

## 2016-07-09 NOTE — Telephone Encounter (Signed)
Pt called this AM to report that she's been having some slight shortness of breath she's noted over the last few days. States this comes about w exertion.  She's also noticed that her BP when checked in AM is elevated, and seems to be progressively higher over a period of days. She denies lightheadedness, syncope, nausea w this. Does report "some" dizziness. She denies symptoms of fluid retention, denies missed doses of meds. Agreeable to be seen for PA eval this AM to assess further. I have added to Hao's schedule at 9:30am & patient confirmed appt details.

## 2016-08-09 DIAGNOSIS — H04123 Dry eye syndrome of bilateral lacrimal glands: Secondary | ICD-10-CM | POA: Diagnosis not present

## 2016-08-09 DIAGNOSIS — H43391 Other vitreous opacities, right eye: Secondary | ICD-10-CM | POA: Diagnosis not present

## 2016-08-09 DIAGNOSIS — H26491 Other secondary cataract, right eye: Secondary | ICD-10-CM | POA: Diagnosis not present

## 2016-08-27 DIAGNOSIS — I700 Atherosclerosis of aorta: Secondary | ICD-10-CM | POA: Diagnosis not present

## 2016-08-27 DIAGNOSIS — R0602 Shortness of breath: Secondary | ICD-10-CM | POA: Diagnosis not present

## 2016-08-27 DIAGNOSIS — Z0000 Encounter for general adult medical examination without abnormal findings: Secondary | ICD-10-CM | POA: Diagnosis not present

## 2016-08-27 DIAGNOSIS — Z79899 Other long term (current) drug therapy: Secondary | ICD-10-CM | POA: Diagnosis not present

## 2016-08-27 DIAGNOSIS — N183 Chronic kidney disease, stage 3 (moderate): Secondary | ICD-10-CM | POA: Diagnosis not present

## 2016-08-27 DIAGNOSIS — I10 Essential (primary) hypertension: Secondary | ICD-10-CM | POA: Diagnosis not present

## 2016-08-27 DIAGNOSIS — E7800 Pure hypercholesterolemia, unspecified: Secondary | ICD-10-CM | POA: Diagnosis not present

## 2016-08-27 DIAGNOSIS — I519 Heart disease, unspecified: Secondary | ICD-10-CM | POA: Diagnosis not present

## 2016-10-03 DIAGNOSIS — G4733 Obstructive sleep apnea (adult) (pediatric): Secondary | ICD-10-CM | POA: Diagnosis not present

## 2016-10-04 ENCOUNTER — Ambulatory Visit (INDEPENDENT_AMBULATORY_CARE_PROVIDER_SITE_OTHER): Payer: Medicare HMO | Admitting: Cardiovascular Disease

## 2016-10-29 ENCOUNTER — Encounter: Payer: Self-pay | Admitting: Cardiovascular Disease

## 2016-10-29 ENCOUNTER — Ambulatory Visit (INDEPENDENT_AMBULATORY_CARE_PROVIDER_SITE_OTHER): Payer: Medicare HMO | Admitting: Cardiovascular Disease

## 2016-10-29 VITALS — BP 140/72 | HR 68 | Ht 58.5 in | Wt 152.8 lb

## 2016-10-29 DIAGNOSIS — I10 Essential (primary) hypertension: Secondary | ICD-10-CM

## 2016-10-29 DIAGNOSIS — E785 Hyperlipidemia, unspecified: Secondary | ICD-10-CM

## 2016-10-29 DIAGNOSIS — R0600 Dyspnea, unspecified: Secondary | ICD-10-CM

## 2016-10-29 DIAGNOSIS — I2510 Atherosclerotic heart disease of native coronary artery without angina pectoris: Secondary | ICD-10-CM | POA: Diagnosis not present

## 2016-10-29 DIAGNOSIS — G4733 Obstructive sleep apnea (adult) (pediatric): Secondary | ICD-10-CM | POA: Diagnosis not present

## 2016-10-29 MED ORDER — PITAVASTATIN CALCIUM 1 MG PO TABS: 1.0000 | ORAL_TABLET | Freq: Every day | ORAL | 0 refills | Status: DC

## 2016-10-29 NOTE — Patient Instructions (Signed)
Your physician has recommended you make the following change in your medication:   1.) start new prescription given today for LIVALO as directed.  Your physician recommends that you return for lab work in: 3 months.  Your physician wants you to follow-up in: 6 months or sooner if needed. You will receive a reminder letter in the mail two months in advance. If you don't receive a letter, please call our office to schedule the follow-up appointment.  If you need a refill on your cardiac medications before your next appointment, please call your pharmacy.

## 2016-10-29 NOTE — Progress Notes (Signed)
Patient ID: Jo Hendrix, female   DOB: [birth year], 83 y.o.   MRN: [id number]    PCP: Dr. Jonathon Jordan  HPI: Jo Hendrix is a 83 y.o. female who presents to the office for a 5 month cardiology evaluation.  Jo Hendrix has a long-standing history of hypertension with documented grade 1 diastolic dysfunction.  She also has a history of complex sleep apnea with documented mild pulmonary hypertension and has been on BiPAP. In October 2008 she underwent cardiac catheterization after developing recurrent episodes of chest pain.  This showed mild coronary obstructive disease with 20% narrowing of the proximal LAD with mid LAD systolic bridging and diffuse luminal irregularities of 40-50% in the RCA.  She has had difficulty with shortness of breath and has been diagnosed with COPD/emphysema.  A cardiopulmonary met test  revealed normal functional status with peak oxygen consumption 105%, but she had an abnormal pulmonary response with no ventilation perfusion mismatch and with plus/minus dead space.  She has been intolerant to statin therapy as well as Zetia.  She continues to use BiPAP for her complex sleep apnea.  When I last saw her 2 months ago she had noticed progressive increasing shortness of breath with activity and had experienced some very mild chest tightness when she is short of breath.  She also admitted to some mild lower extremity edema particularly involving the left leg by the end of the day.    Aa nuclear stress test on 04/08/2015 was low risk.  Post-rest ejection fraction was 71%.  There was normal perfusion.  An echo Doppler study on 04/07/2015 showed an ejection fraction at 55-60%.  There was grade 1 diastolic dysfunction.  There was mild mitral annular calcification with trace MR and trace TR.  Since I last saw her, she stopped taking statin therapy.  She had blood work done by [employer] 1 Jerry 15 2018, which I reviewed.  TSH was normal at 0.98.  Vitamin D was 68.8.  Lipid  studies were significantly increased with a total cholesterol 239, LDL 142, non-HDL 168, triglycerides 127, and HDL 71.  She continues to use her BiPAP for previously documented complex sleep apnea.. She denies palpitations.  She denies PND, orthopnea or anginal symptomatology. She presents for evaluation.   Past Medical History:  Diagnosis Date  . ACE-inhibitor cough   . COPD (chronic obstructive pulmonary disease) ([place])   . Diastolic dysfunction   . History of tobacco use    smoked 25 years  . Hypertension   . Mild CAD   . Mild pulmonary hypertension   . OSA (obstructive sleep apnea)    Sleep Study 04/07/2007 - AHI during total sleep 21.30/hr, during REM 20.16/hr - BiPAP auto servo-ventilation unit    Past Surgical History:  Procedure Laterality Date  . benign lesion removed on lung  2000  . BLADDER REPAIR    . CARDIAC CATHETERIZATION  05/20/2007   mild coronary obstructive disease with 20% narrowing in prox LAD, diffuse luminal irregularity of 40-50% in mid RCA (Dr. Corky Downs)  . Cardiopulmonary Met Test  01/28/2012   with PFTs - FEV1 & FEV1/VC WNL, VC WNL, DLCO WNL; abnormal pulmonary response  . Carotid Doppler  04/2011   normal patency  . CARPAL TUNNEL RELEASE     x2  . CATARACT EXTRACTION, BILATERAL    . GALLBLADDER SURGERY    . NM MYOCAR PERF WALL MOTION  09/2011   lexiscan myoview - normal perfusion, EF 77%, low risk  .  Renal Doppler  05/2007   normal renal arteries   . TONSILLECTOMY    . TRANSTHORACIC ECHOCARDIOGRAM  2013   EF 50-55%; mild MR; mild TR; mild pulm valve regurg; aortic root sclerosis/calcification  . VAGINAL HYSTERECTOMY    . VARICOSE VEIN SURGERY      Allergies  Allergen Reactions  . Zetia [Ezetimibe] Other (See Comments)    Chest pressure  . Ace Inhibitors Cough  . Codeine Other (See Comments)    difficulty urinating  . Erythromycin Other (See Comments)    DOESN'T REMEMBER   . Ivp Dye [Iodinated Diagnostic Agents] Hives    hives    Current  Outpatient Prescriptions  Medication Sig Dispense Refill  . ALPRAZolam (XANAX) 0.25 MG tablet Take 1 tablet by mouth as needed.    Marland Kitchen aspirin 81 MG tablet Take 81 mg by mouth daily.    . Calcium 1500 MG tablet Take 1,500 mg by mouth daily.    . cholecalciferol (VITAMIN D) 1000 UNITS tablet Take 1 tablet by mouth daily. Take 1 tab daily    . NON FORMULARY at bedtime. BiPAP    . Omega-3 Fatty Acids (FISH OIL) 1000 MG CAPS Take 1 capsule by mouth 3 (three) times daily.    . Pitavastatin Calcium (LIVALO) 1 MG TABS Take 1 tablet (1 mg total) by mouth daily. 90 tablet 0  . RESTASIS 0.05 % ophthalmic emulsion Place 1 drop into both eyes 2 (two) times daily. Use 1 drop in each eye twice a day    . valsartan-hydrochlorothiazide (DIOVAN-HCT) 320-12.5 MG tablet Take 1 tablet by mouth daily. 90 tablet 1   No current facility-administered medications for this visit.    Facility-Administered Medications Ordered in Other Visits  Medication Dose Route Frequency Provider Last Rate Last Dose  . aminophylline injection 75 mg  75 mg Intravenous TID PRN Larey Dresser, MD   75 mg at 04/07/15 1105    Social History   Social History  . Marital status: Widowed    Spouse name: N/A  . Number of children: 3  . Years of education: N/A   Occupational History  . retired    Social History Main Topics  . Smoking status: Former Smoker    Packs/day: 0.50    Years: 25.00    Types: Cigarettes    Quit date: 08/13/1984  . Smokeless tobacco: Never Used  . Alcohol use No  . Drug use: No  . Sexual activity: Not on file   Other Topics Concern  . Not on file   Social History Narrative  . No narrative on file    Socially, she is widowed, has 3 children, 8 grandchildren 5 great-grandchildren.  There is a remote tobacco history but she quit many years ago.  Family History  Problem Relation Age of Onset  . Lung cancer Mother   . Suicidality Father   . Cancer Sister   . Breast cancer Daughter   . Stroke Sister     ROS General: Negative; No fevers, chills, or night sweats;  HEENT: Negative; No changes in vision or hearing, sinus congestion, difficulty swallowing Pulmonary: Negative; No cough, wheezing, shortness of breath, hemoptysis Cardiovascular: See history of present illness Positive for mild shortness of breath with activity, unchanged. GI: Negative; No nausea, vomiting, diarrhea, or abdominal pain GU: Negative; No dysuria, hematuria, or difficulty voiding Musculoskeletal: Negative; no myalgias, joint pain, or weakness Hematologic/Oncology: Negative; no easy bruising, bleeding Endocrine: Negative; no heat/cold intolerance; no diabetes Neuro: Negative; no changes  in balance, headaches Skin: Negative; No rashes or skin lesions Psychiatric: Negative; No behavioral problems, depression Sleep: Positive for complex sleep apnea, on therapy; No snoring, daytime sleepiness, hypersomnolence, bruxism, restless legs, hypnogognic hallucinations, no cataplexy Other comprehensive 14 point system review is negative.   PE BP 140/72 (BP Location: Right Arm, Patient Position: Sitting)   Pulse 68   Ht 4' 10.5" (1.486 m)   Wt 152 lb 12.8 oz (69.3 kg)   BMI 31.39 kg/m    Repeat blood pressure by me was 134/70  Wt Readings from Last 3 Encounters:  10/29/16 152 lb 12.8 oz (69.3 kg)  07/09/16 154 lb (69.9 kg)  06/19/16 154 lb (69.9 kg)   General: Alert, oriented, no distress , who appears younger than stated age. Skin: normal turgor, no rashes HEENT: Normocephalic, atraumatic. Pupils round and reactive; sclera anicteric;no lid lag. Extraocular muscles intact; no xanthelasmas. Nose without nasal septal hypertrophy Mouth/Parynx benign; Mallinpatti scale 3 Neck: No JVD, no carotid bruits; normal carotid upstroke Lungs: clear to ausculatation and percussion; no wheezing or rales Chest wall: no tenderness to palpitation Heart: RRR, s1 s2 normal; 1/6 systolic murmur no diastolic murmur, rub thrills or  heaves Abdomen: soft, nontender; no hepatosplenomehaly, BS+; abdominal aorta nontender and not dilated by palpation. Back: no CVA tenderness Pulses 2+ Extremities: Previous ankle edema has resolved; no clubbing cyanosis , Homan's sign negative  Neurologic: grossly nonfocal; cranial nerves grossly normal. Psychologic: normal affect and mood.  .ECG (independently read by me): Normal sinus rhythm at 68 bpm.  Normal intervals.  Poor R wave progression anteriorly.  October 2017 ECG (independently read by me): Normal sinus rhythm at 66 bpm.  Poor R wave progression.  Normal intervals.  October 2016 ECG (independently read by me): Normal sinus rhythm at 66 bpm.  Nonspecific T changes.  September 2015 ECG (independently read by me): Normal sinus rhythm at 62 beats per minute.  Poor progression anteriorly.  Nonspecific ST changes  12/03/2013 ECG (independently read by me): Normal sinus rhythm at 62 beats per minute.  Nonspecific ST changes.  No ectopy  LABS:  BMP Latest Ref Rng & Units 06/11/2016 03/30/2015 02/26/2014  Glucose 65 - 99 mg/dL 86 91 78  BUN 7 - 25 mg/dL '24 16 21  ' Creatinine 0.60 - 0.88 mg/dL 1.35(H) 1.11(H) 1.23(H)  Sodium 135 - 146 mmol/L 139 138 138  Potassium 3.5 - 5.3 mmol/L 4.2 4.5 4.8  Chloride 98 - 110 mmol/L 102 101 101  CO2 20 - 31 mmol/L '27 27 29  ' Calcium 8.6 - 10.4 mg/dL 9.7 9.4 9.2   Hepatic Function Latest Ref Rng & Units 06/11/2016 03/30/2015 02/26/2014  Total Protein 6.1 - 8.1 g/dL 7.3 7.5 7.0  Albumin 3.6 - 5.1 g/dL 3.9 3.9 3.6  AST 10 - 35 U/L '15 16 15  ' ALT 6 - 29 U/L '14 14 13  ' Alk Phosphatase 33 - 130 U/L 66 72 72  Total Bilirubin 0.2 - 1.2 mg/dL 0.5 0.5 0.4   CBC Latest Ref Rng & Units 06/11/2016 03/30/2015  WBC 3.8 - 10.8 K/uL 8.4 8.3  Hemoglobin 11.7 - 15.5 g/dL 13.9 14.9  Hematocrit 35.0 - 45.0 % 41.1 43.1  Platelets 140 - 400 K/uL 283 324   Lab Results  Component Value Date   MCV 89.7 06/11/2016   MCV 88.3 03/30/2015   Lab Results  Component  Value Date   TSH 0.85 06/11/2016   No results found for: HGBA1C  Lipid Panel     Component Value  Date/Time   CHOL 216 (H) 06/11/2016 0827   TRIG 121 06/11/2016 0827   HDL 71 06/11/2016 0827   CHOLHDL 3.0 06/11/2016 0827   VLDL 24 06/11/2016 0827   LDLCALC 121 06/11/2016 0827    RADIOLOGY: No results found.  IMPRESSION:  1. Mild CAD   2. Essential hypertension   3. Hyperlipidemia, unspecified hyperlipidemia type   4. OSA (obstructive sleep apnea)   5. Dyspnea, unspecified type     ASSESSMENT AND PLAN: Jo Hendrix is a very pleasant young appearing 83 year old female who has a long-standing history of hypertension and has complex sleep apnea on BiPAP therapy.  When I last saw her, I changed her losartan HCT to valsartan HCT 320/12.5 mg.  Herscher today when rechecked by me was 134/70.  On this regimen.  She denies significant issues with this treatment.  She has had difficulty taking numerous statins including Crestor, simvastatin and atorvastatin.  I reviewed blood work from Dr. Cheron Schaumann.  Her LDL cholesterol is 142 with non-HDL cholesterol 168.  I provided her with samples of little low and have recommended she try taking 1 mg at least every third day to see if she is able to tolerate this and if so, can change this ultimately to every other day and possibly daily.  Her most recent creatinine was 1.23 and estimated GFR was 41, placing her in stage III CKG.  She admits to 100% use with BiPAP therapy.  She denies breakthrough snoring.  Her sleep is restorative.  There is no daytime sleepiness, however, at times she does feel fatigued.  Her vitamin D level was normal.  TSH was also normal.  Last year she had developed increasing shortness of breath with activity and has had mild associated chest tightness. Her echo Doppler studies showed normal systolic function with mild grade 1 diastolic dysfunction, mitral annular calcification with trace MR and trace TR.  Her nuclear perfusion study  was normal.  Lab work will be checked in 3 months following initiation of therapy.  I will see her in 6 months for reevaluation     Time spent: 25 minutes  Troy Sine, MD, [facility]  10/29/2016 7:01 PM

## 2016-11-21 ENCOUNTER — Telehealth: Payer: Self-pay | Admitting: Cardiovascular Disease

## 2016-11-21 NOTE — Telephone Encounter (Signed)
New Message  Pt voiced wanting to speak with nurse.  Please f/u

## 2016-11-21 NOTE — Telephone Encounter (Signed)
Spoke with pt states that she states that she has been taking livalo since her appt when Dr Claiborne Billings gave her samples and she states that she is having muscle aches and fatigue and she cannot handle it anymore. She is asking if she can go back on the Zetia again. She will stop Livalo. Pt aware that Denyse Dago is out of the office today. Please advise

## 2016-11-22 NOTE — Telephone Encounter (Signed)
Follow up   Pt states that she told you the wrong thing in regards to the rx and wants you to call her back before you speak with Dr. Claiborne Billings

## 2016-11-23 NOTE — Telephone Encounter (Signed)
Returned call to patient she stated she cannot take Livalo causes muscle pain.She wanted to ask Dr.Kelly if she can take Crestor once a week again.Stated she does not remember what mg tablet she had.Message sent to [facility] for advice.

## 2016-11-23 NOTE — Telephone Encounter (Signed)
Please find out what she wants

## 2016-11-23 NOTE — Telephone Encounter (Signed)
Try crestor 5 mg once a week and if tolerates then twice a week

## 2016-11-23 NOTE — Telephone Encounter (Signed)
Patient notified of MD advice. Voiced understanding. Med list updated. Patient has Rx at home - no refill needed.

## 2016-12-20 ENCOUNTER — Ambulatory Visit (INDEPENDENT_AMBULATORY_CARE_PROVIDER_SITE_OTHER): Payer: Medicare HMO

## 2016-12-20 ENCOUNTER — Ambulatory Visit (INDEPENDENT_AMBULATORY_CARE_PROVIDER_SITE_OTHER): Payer: Medicare HMO | Admitting: Podiatry

## 2016-12-20 ENCOUNTER — Ambulatory Visit: Payer: Medicare HMO

## 2016-12-20 ENCOUNTER — Encounter: Payer: Self-pay | Admitting: Podiatry

## 2016-12-20 VITALS — BP 164/81 | HR 67 | Resp 16 | Ht 60.0 in | Wt 150.0 lb

## 2016-12-20 DIAGNOSIS — M7752 Other enthesopathy of left foot: Secondary | ICD-10-CM

## 2016-12-20 DIAGNOSIS — M79671 Pain in right foot: Secondary | ICD-10-CM | POA: Diagnosis not present

## 2016-12-20 DIAGNOSIS — M79672 Pain in left foot: Secondary | ICD-10-CM

## 2016-12-20 DIAGNOSIS — M21612 Bunion of left foot: Secondary | ICD-10-CM

## 2016-12-20 DIAGNOSIS — M779 Enthesopathy, unspecified: Secondary | ICD-10-CM

## 2016-12-20 DIAGNOSIS — T8732 Neuroma of amputation stump, left upper extremity: Secondary | ICD-10-CM

## 2016-12-20 DIAGNOSIS — M21619 Bunion of unspecified foot: Secondary | ICD-10-CM

## 2016-12-20 NOTE — Progress Notes (Signed)
Subjective:    Patient ID: Jo Hendrix, female   DOB: 83 y.o.   MRN: [id number]   HPI patient states she's developed a lot of burning left over right foot third interspace with radiating discomforts into adjacent digits and states it's been present recently and she had this a long time ago but it has become tender again recently    Review of Systems  All other systems reviewed and are negative.       Objective:  Physical Exam  Cardiovascular: Intact distal pulses.   Musculoskeletal: Normal range of motion.  Neurological: She is alert.  Skin: Skin is warm.  Nursing note and vitals reviewed.  neurovascular status was intact with muscle strength adequate range of motion within normal limits for her age. I noted structural bunion deformity bilateral that only bothers her some and exquisite discomfort in the third intermetatarsal space left over right with positive Biagio Borg sign noted. Good digital perfusion well oriented 3     Assessment:    Probable neuroma symptomatology left over right that was under very good control for about 8-10 years and now has become bothersome again     Plan:    H&P x-rays reviewed and today for the left foot I did do a sterile prep of the area injected directly into the nerve with a purified alcohol Marcaine solution which was tolerated well. Reappoint 2 weeks to see results  X-rays indicate that there is no indications of arthritis or stress fracture with moderate bunion deformity noted bilateral

## 2016-12-20 NOTE — Progress Notes (Signed)
   Subjective:    Patient ID: Jo Hendrix, female    DOB: 26-Jun-1927, 83 y.o.   MRN: [id number]  HPI Chief Complaint  Patient presents with  . Foot Pain    Left foot; plantar forefoot & 4th toe; pt stated, "Has a burning and stabbing sensation in 4th toe and ball of foot"; x6 months  . Foot Pain    Bilateral; plantar forefoot; pt stated, "when they stand or walk, balls of feet hurt"' x6 months       Review of Systems  HENT: Positive for hearing loss and sneezing.   Eyes: Positive for itching.  Respiratory: Positive for shortness of breath.   Musculoskeletal: Positive for arthralgias, back pain and myalgias.  Neurological: Positive for numbness.  All other systems reviewed and are negative.      Objective:   Physical Exam        Assessment & Plan:

## 2017-01-03 ENCOUNTER — Ambulatory Visit (INDEPENDENT_AMBULATORY_CARE_PROVIDER_SITE_OTHER): Payer: Medicare HMO | Admitting: Podiatry

## 2017-01-03 DIAGNOSIS — D3610 Benign neoplasm of peripheral nerves and autonomic nervous system, unspecified: Secondary | ICD-10-CM

## 2017-01-03 NOTE — Progress Notes (Signed)
Subjective:    Patient ID: Jo Hendrix, female   DOB: 83 y.o.   MRN: [id number]   HPI patient presents stating it was really good for a few days and it's been starting to bother me again    ROS      Objective:  Physical Exam  Neurovascular status intact with patient found to have continued shooting pain with radiating discomfort third interspace left    Assessment:    Neuroma symptomatology third left     Plan:    Sterile prep and injected with a purified alcohol Marcaine solution into the nerve and instructed on wider shoes for the short period

## 2017-01-16 DIAGNOSIS — E785 Hyperlipidemia, unspecified: Secondary | ICD-10-CM | POA: Diagnosis not present

## 2017-01-16 DIAGNOSIS — I10 Essential (primary) hypertension: Secondary | ICD-10-CM | POA: Diagnosis not present

## 2017-01-16 LAB — COMPREHENSIVE METABOLIC PANEL
ALT: 16 U/L (ref 6–29)
AST: 18 U/L (ref 10–35)
Albumin: 3.8 g/dL (ref 3.6–5.1)
Alkaline Phosphatase: 62 U/L (ref 33–130)
BILIRUBIN TOTAL: 0.4 mg/dL (ref 0.2–1.2)
BUN: 21 mg/dL (ref 7–25)
CALCIUM: 9 mg/dL (ref 8.6–10.4)
CO2: 27 mmol/L (ref 20–31)
Chloride: 103 mmol/L (ref 98–110)
Creat: 1.35 mg/dL — ABNORMAL HIGH (ref 0.60–0.88)
GLUCOSE: 80 mg/dL (ref 65–99)
Potassium: 4.4 mmol/L (ref 3.5–5.3)
Sodium: 140 mmol/L (ref 135–146)
TOTAL PROTEIN: 7.1 g/dL (ref 6.1–8.1)

## 2017-01-16 LAB — LIPID PANEL
CHOL/HDL RATIO: 2.9 ratio (ref <5.0)
CHOLESTEROL: 205 mg/dL — AB (ref <200)
HDL: 71 mg/dL (ref >50)
LDL Cholesterol: 103 mg/dL — ABNORMAL HIGH (ref <100)
Triglycerides: 153 mg/dL — ABNORMAL HIGH (ref <150)
VLDL: 31 mg/dL — ABNORMAL HIGH (ref <30)

## 2017-01-17 ENCOUNTER — Ambulatory Visit: Payer: Medicare HMO | Admitting: Podiatry

## 2017-01-18 ENCOUNTER — Ambulatory Visit (INDEPENDENT_AMBULATORY_CARE_PROVIDER_SITE_OTHER): Payer: Medicare HMO | Admitting: Podiatry

## 2017-01-18 ENCOUNTER — Encounter: Payer: Self-pay | Admitting: Podiatry

## 2017-01-18 DIAGNOSIS — D3610 Benign neoplasm of peripheral nerves and autonomic nervous system, unspecified: Secondary | ICD-10-CM | POA: Diagnosis not present

## 2017-01-18 NOTE — Progress Notes (Signed)
Subjective:    Patient ID: Jo Hendrix, female   DOB: 83 y.o.   MRN: [id number]   HPI patient states that I am still getting some discomfort in my third interspace left but not sure if the injections are helping    ROS      Objective:  Physical Exam patient is of advanced age or trying to avoid surgery and still has discomfort third interspace left     Assessment:     Appears to be neuroma symptomatology left    Plan:    We are getting continue conservative care and today I did a sterile prep of the left and injected directly into the nerve root prior to breaking into digital branches with a purified alcohol Marcaine solution for a neuro lysis procedure

## 2017-02-04 ENCOUNTER — Other Ambulatory Visit: Payer: Self-pay | Admitting: Physician Assistant

## 2017-02-04 NOTE — Telephone Encounter (Signed)
Please review for refill, Thanks !  

## 2017-02-18 ENCOUNTER — Telehealth: Payer: Self-pay | Admitting: *Deleted

## 2017-02-18 ENCOUNTER — Encounter: Payer: Self-pay | Admitting: Podiatry

## 2017-02-18 ENCOUNTER — Encounter: Payer: Self-pay | Admitting: *Deleted

## 2017-02-18 ENCOUNTER — Ambulatory Visit (INDEPENDENT_AMBULATORY_CARE_PROVIDER_SITE_OTHER): Payer: Medicare HMO | Admitting: Podiatry

## 2017-02-18 DIAGNOSIS — D3610 Benign neoplasm of peripheral nerves and autonomic nervous system, unspecified: Secondary | ICD-10-CM

## 2017-02-18 NOTE — Telephone Encounter (Signed)
Spoke with patient to give her lab results and recommendations. She states that she was unable to take the livalo, therefore she has been taking rosuvastatin 5 mg once a week. Dr Claiborne Billings will be notified with further recommendations to follow.

## 2017-02-18 NOTE — Telephone Encounter (Signed)
-----   Message from Troy Sine, MD sent at 02/17/2017  7:18 PM EDT ----- Labs stable, except lipids.  Consider increasing Livalo to 2 mg daily if she can tolerate this.

## 2017-02-19 NOTE — Telephone Encounter (Signed)
-----   Message from Troy Sine, MD sent at 02/17/2017  7:18 PM EDT ----- Labs stable, except lipids.  Consider increasing Livalo to 2 mg daily if she can tolerate this.

## 2017-02-19 NOTE — Progress Notes (Signed)
Subjective:    Patient ID: Jo Hendrix, female   DOB: 83 y.o.   MRN: [id number]   HPI patient states that it's improving quite a bit    ROS      Objective:  Physical Exam neurovascular status intact with patient's third interspace showing improvement with still some sharp shooting pains but improved from previous     Assessment:    Neuroma symptomatology improved left     Plan:    Sterile prep left forefoot and injected directly into the digital nerve with a purified alcohol Marcaine solution which was tolerated well 1.3 mL and patient's discharge and less needs to be seen

## 2017-02-19 NOTE — Telephone Encounter (Signed)
Informed patient per Dr Claiborne Billings to try to increase the rosuvastatin to twice a week. Repeat labs in about 8 weeks. Patient voice understanding of these orders.

## 2017-03-09 DIAGNOSIS — M67912 Unspecified disorder of synovium and tendon, left shoulder: Secondary | ICD-10-CM | POA: Diagnosis not present

## 2017-03-13 DIAGNOSIS — M25512 Pain in left shoulder: Secondary | ICD-10-CM | POA: Diagnosis not present

## 2017-03-13 DIAGNOSIS — M67912 Unspecified disorder of synovium and tendon, left shoulder: Secondary | ICD-10-CM | POA: Diagnosis not present

## 2017-03-18 ENCOUNTER — Ambulatory Visit (INDEPENDENT_AMBULATORY_CARE_PROVIDER_SITE_OTHER): Payer: Medicare HMO | Admitting: Podiatry

## 2017-03-18 ENCOUNTER — Encounter: Payer: Self-pay | Admitting: Podiatry

## 2017-03-18 DIAGNOSIS — D3610 Benign neoplasm of peripheral nerves and autonomic nervous system, unspecified: Secondary | ICD-10-CM

## 2017-03-18 DIAGNOSIS — M67912 Unspecified disorder of synovium and tendon, left shoulder: Secondary | ICD-10-CM | POA: Diagnosis not present

## 2017-03-18 DIAGNOSIS — M25512 Pain in left shoulder: Secondary | ICD-10-CM | POA: Diagnosis not present

## 2017-03-18 NOTE — Progress Notes (Signed)
Subjective:    Patient ID: Jo Hendrix, female   DOB: 83 y.o.   MRN: [id number]   HPI patient states she still having pain with her left foot but has some improvement from previous    ROS      Objective:  Physical Exam neurovascular status intact muscle strength adequate with patient found to have moderate shooting pain third interspace left foot with radiating discomfort into the adjacent digit with improvement from previous visit     Assessment:   Neuroma symptomatology left present      Plan:   We discussed long-term treatment plan at this point were to continue to try conservative even though ultimately could require surgery. I went ahead today and I did do a sterile prep of the left foot and I then injected directly into the nerve prior to breaking into digital branches with a purified alcohol Marcaine and if this does not improve we'll have to consider surgical excision of mass

## 2017-03-21 DIAGNOSIS — M25512 Pain in left shoulder: Secondary | ICD-10-CM | POA: Diagnosis not present

## 2017-03-21 DIAGNOSIS — M67912 Unspecified disorder of synovium and tendon, left shoulder: Secondary | ICD-10-CM | POA: Diagnosis not present

## 2017-03-25 DIAGNOSIS — M67912 Unspecified disorder of synovium and tendon, left shoulder: Secondary | ICD-10-CM | POA: Diagnosis not present

## 2017-03-25 DIAGNOSIS — M25512 Pain in left shoulder: Secondary | ICD-10-CM | POA: Diagnosis not present

## 2017-03-26 DIAGNOSIS — R3911 Hesitancy of micturition: Secondary | ICD-10-CM | POA: Diagnosis not present

## 2017-03-26 DIAGNOSIS — R5383 Other fatigue: Secondary | ICD-10-CM | POA: Diagnosis not present

## 2017-03-26 DIAGNOSIS — I10 Essential (primary) hypertension: Secondary | ICD-10-CM | POA: Diagnosis not present

## 2017-03-26 DIAGNOSIS — R0602 Shortness of breath: Secondary | ICD-10-CM | POA: Diagnosis not present

## 2017-03-26 DIAGNOSIS — I25119 Atherosclerotic heart disease of native coronary artery with unspecified angina pectoris: Secondary | ICD-10-CM | POA: Diagnosis not present

## 2017-04-02 DIAGNOSIS — J430 Unilateral pulmonary emphysema [MacLeod's syndrome]: Secondary | ICD-10-CM | POA: Diagnosis not present

## 2017-04-02 DIAGNOSIS — R0600 Dyspnea, unspecified: Secondary | ICD-10-CM | POA: Diagnosis not present

## 2017-04-02 DIAGNOSIS — M67912 Unspecified disorder of synovium and tendon, left shoulder: Secondary | ICD-10-CM | POA: Diagnosis not present

## 2017-04-02 DIAGNOSIS — G4733 Obstructive sleep apnea (adult) (pediatric): Secondary | ICD-10-CM | POA: Diagnosis not present

## 2017-04-02 DIAGNOSIS — M25512 Pain in left shoulder: Secondary | ICD-10-CM | POA: Diagnosis not present

## 2017-04-04 DIAGNOSIS — M25512 Pain in left shoulder: Secondary | ICD-10-CM | POA: Diagnosis not present

## 2017-04-04 DIAGNOSIS — M67912 Unspecified disorder of synovium and tendon, left shoulder: Secondary | ICD-10-CM | POA: Diagnosis not present

## 2017-04-08 ENCOUNTER — Other Ambulatory Visit: Payer: Self-pay | Admitting: Cardiovascular Disease

## 2017-04-09 DIAGNOSIS — M67912 Unspecified disorder of synovium and tendon, left shoulder: Secondary | ICD-10-CM | POA: Diagnosis not present

## 2017-04-09 DIAGNOSIS — M25512 Pain in left shoulder: Secondary | ICD-10-CM | POA: Diagnosis not present

## 2017-04-11 ENCOUNTER — Telehealth: Payer: Self-pay | Admitting: Cardiovascular Disease

## 2017-04-11 DIAGNOSIS — M25512 Pain in left shoulder: Secondary | ICD-10-CM | POA: Diagnosis not present

## 2017-04-11 DIAGNOSIS — M67912 Unspecified disorder of synovium and tendon, left shoulder: Secondary | ICD-10-CM | POA: Diagnosis not present

## 2017-04-11 NOTE — Telephone Encounter (Signed)
New message    Pt is calling. She said she needs a new prescription for a new bipap machine. She said hers is old and she can no longer get supplies for it. Please call.

## 2017-04-12 NOTE — Telephone Encounter (Signed)
Left message to make aware that she will need appt with Dr. Claiborne Billings.  Scheduler to reach out to get this scheduled.  advised to call with questions or concerns.

## 2017-04-17 DIAGNOSIS — M25512 Pain in left shoulder: Secondary | ICD-10-CM | POA: Diagnosis not present

## 2017-04-24 ENCOUNTER — Telehealth: Payer: Self-pay | Admitting: Cardiovascular Disease

## 2017-04-24 DIAGNOSIS — E785 Hyperlipidemia, unspecified: Secondary | ICD-10-CM

## 2017-04-24 DIAGNOSIS — Z79899 Other long term (current) drug therapy: Secondary | ICD-10-CM

## 2017-04-24 NOTE — Telephone Encounter (Signed)
New Message   pt verbalized that she is calling for rn   For a lab order to be sent to her before her appt

## 2017-04-24 NOTE — Telephone Encounter (Signed)
Patient had CMET & lipid in June - MD recommended increasing livalo to 2mg .   Per phone note on 7/9: Lauralee Evener, CMA  10:11 AM  Note    Informed patient per Dr Claiborne Billings to try to increase the rosuvastatin to twice a week. Repeat labs in about 8 weeks. Patient voice understanding of these orders.     Returned call to patient. She reports she is taking crestor 1-2x weekly as tolerated. Advised her to have fasting labs (lipid, CMET) prior to 9/21 office visit.

## 2017-04-25 ENCOUNTER — Ambulatory Visit: Payer: Medicare HMO | Admitting: Physical Therapy

## 2017-05-03 ENCOUNTER — Encounter: Payer: Self-pay | Admitting: Cardiovascular Disease

## 2017-05-03 ENCOUNTER — Ambulatory Visit (INDEPENDENT_AMBULATORY_CARE_PROVIDER_SITE_OTHER): Payer: Medicare HMO | Admitting: Cardiovascular Disease

## 2017-05-03 VITALS — BP 160/80 | HR 76 | Ht 59.0 in | Wt 153.0 lb

## 2017-05-03 DIAGNOSIS — I2510 Atherosclerotic heart disease of native coronary artery without angina pectoris: Secondary | ICD-10-CM | POA: Diagnosis not present

## 2017-05-03 DIAGNOSIS — R0609 Other forms of dyspnea: Secondary | ICD-10-CM

## 2017-05-03 DIAGNOSIS — E785 Hyperlipidemia, unspecified: Secondary | ICD-10-CM

## 2017-05-03 DIAGNOSIS — G4733 Obstructive sleep apnea (adult) (pediatric): Secondary | ICD-10-CM | POA: Diagnosis not present

## 2017-05-03 DIAGNOSIS — Z79899 Other long term (current) drug therapy: Secondary | ICD-10-CM | POA: Diagnosis not present

## 2017-05-03 DIAGNOSIS — I10 Essential (primary) hypertension: Secondary | ICD-10-CM | POA: Diagnosis not present

## 2017-05-03 MED ORDER — AMLODIPINE BESYLATE 5 MG PO TABS: 5.0000 mg | ORAL_TABLET | Freq: Every day | ORAL | 3 refills | Status: DC

## 2017-05-03 NOTE — Progress Notes (Signed)
Patient ID: Jo Hendrix, female   DOB: [birth year], 83 y.o.   MRN: [id number]    PCP: Dr. Jonathon Jordan  HPI: Jo Hendrix is a 83 y.o. female who presents to the office for a 6 month cardiology evaluation.  Jo Hendrix has a long-standing history of hypertension with documented grade 1 diastolic dysfunction.  She also has a history of complex sleep apnea with documented mild pulmonary hypertension and has been on BiPAP. In October 2008 she underwent cardiac catheterization after developing recurrent episodes of chest pain.  This showed mild coronary obstructive disease with 20% narrowing of the proximal LAD with mid LAD systolic bridging and diffuse luminal irregularities of 40-50% in the RCA.  She has had difficulty with shortness of breath and has been diagnosed with COPD/emphysema.  A cardiopulmonary met test  revealed normal functional status with peak oxygen consumption 105%, but she had an abnormal pulmonary response with no ventilation perfusion mismatch and with plus/minus dead space.  She has been intolerant to statin therapy as well as Zetia.  She continues to use BiPAP for her complex sleep apnea.  When I last saw her 2 months ago she had noticed progressive increasing shortness of breath with activity and had experienced some very mild chest tightness when she is short of breath.  She also admitted to some mild lower extremity edema particularly involving the left leg by the end of the day.    Aa nuclear stress test on 04/08/2015 was low risk.  Post-rest ejection fraction was 71%.  There was normal perfusion.  An echo Doppler study on 04/07/2015 showed an ejection fraction at 55-60%.  There was grade 1 diastolic dysfunction.  There was mild mitral annular calcification with trace MR and trace TR.  She stopped taking statin therapy.  She had blood work done by [employer] 1 Jerry 15 2018, which I reviewed.  TSH was normal at 0.98.  Vitamin D was 68.8.  Lipid studies were significantly  increased with a total cholesterol 239, LDL 142, non-HDL 168, triglycerides 127, and HDL 71.  She continues to use her BiPAP for previously documented complex sleep apnea..   Since I last saw her in March 2019.  She denies any chest pain.  She continues to experience some shortness of breath with activity, which is not significantly changed.  She does note fatigability.  She continues to use BiPAP.  Her DME company.  His apnea.  She tells me she is not taking her medications for 4 days because she was fatigued and felt that the medications were contributing to this.  She presents for evaluation.  Past Medical History:  Diagnosis Date  . ACE-inhibitor cough   . COPD (chronic obstructive pulmonary disease) ([place])   . Diastolic dysfunction   . History of tobacco use    smoked 25 years  . Hypertension   . Mild CAD   . Mild pulmonary hypertension ([place])   . OSA (obstructive sleep apnea)    Sleep Study 04/07/2007 - AHI during total sleep 21.30/hr, during REM 20.16/hr - BiPAP auto servo-ventilation unit    Past Surgical History:  Procedure Laterality Date  . benign lesion removed on lung  2000  . BLADDER REPAIR    . CARDIAC CATHETERIZATION  05/20/2007   mild coronary obstructive disease with 20% narrowing in prox LAD, diffuse luminal irregularity of 40-50% in mid RCA (Dr. Corky Downs)  . Cardiopulmonary Met Test  01/28/2012   with PFTs - FEV1 & FEV1/VC WNL, VC  WNL, DLCO WNL; abnormal pulmonary response  . Carotid Doppler  04/2011   normal patency  . CARPAL TUNNEL RELEASE     x2  . CATARACT EXTRACTION, BILATERAL    . GALLBLADDER SURGERY    . NM MYOCAR PERF WALL MOTION  09/2011   lexiscan myoview - normal perfusion, EF 77%, low risk  . Renal Doppler  05/2007   normal renal arteries   . TONSILLECTOMY    . TRANSTHORACIC ECHOCARDIOGRAM  2013   EF 50-55%; mild MR; mild TR; mild pulm valve regurg; aortic root sclerosis/calcification  . VAGINAL HYSTERECTOMY    . VARICOSE VEIN SURGERY      Allergies   Allergen Reactions  . Zetia [Ezetimibe] Other (See Comments)    Chest pressure  . Ace Inhibitors Cough  . Codeine Other (See Comments)    difficulty urinating  . Erythromycin Other (See Comments)    DOESN'T REMEMBER   . Ivp Dye [Iodinated Diagnostic Agents] Hives    hives    Current Outpatient Prescriptions  Medication Sig Dispense Refill  . ALPRAZolam (XANAX) 0.25 MG tablet Take 1 tablet by mouth as needed.    Marland Kitchen aspirin 81 MG tablet Take 81 mg by mouth daily.    . Calcium 1500 MG tablet Take 1,500 mg by mouth daily.    . cholecalciferol (VITAMIN D) 1000 UNITS tablet Take 1 tablet by mouth daily. Take 1 tab daily    . NON FORMULARY at bedtime. BiPAP    . Omega-3 Fatty Acids (FISH OIL) 1000 MG CAPS Take 1 capsule by mouth 3 (three) times daily.    . RESTASIS 0.05 % ophthalmic emulsion Place 1 drop into both eyes 2 (two) times daily. Use 1 drop in each eye twice a day    . rosuvastatin (CRESTOR) 5 MG tablet daily. Take 1 tablet by mouth once a week - if tolerated, increase to twice per week    . amLODipine (NORVASC) 5 MG tablet Take 1 tablet (5 mg total) by mouth daily. 30 tablet 3   No current facility-administered medications for this visit.    Facility-Administered Medications Ordered in Other Visits  Medication Dose Route Frequency Provider Last Rate Last Dose  . aminophylline injection 75 mg  75 mg Intravenous TID PRN Larey Dresser, MD   75 mg at 04/07/15 1105    Social History   Social History  . Marital status: Widowed    Spouse name: N/A  . Number of children: 3  . Years of education: N/A   Occupational History  . retired    Social History Main Topics  . Smoking status: Former Smoker    Packs/day: 0.50    Years: 25.00    Types: Cigarettes    Quit date: 08/13/1984  . Smokeless tobacco: Never Used  . Alcohol use No  . Drug use: No  . Sexual activity: Not on file   Other Topics Concern  . Not on file   Social History Narrative  . No narrative on file     Socially, she is widowed, has 3 children, 8 grandchildren 5 great-grandchildren.  There is a remote tobacco history but she quit many years ago.  Family History  Problem Relation Age of Onset  . Lung cancer Mother   . Suicidality Father   . Cancer Sister   . Breast cancer Daughter   . Stroke Sister    ROS General: Negative; No fevers, chills, or night sweats;  HEENT: Negative; No changes in vision or hearing,  sinus congestion, difficulty swallowing Pulmonary: Negative; No cough, wheezing, shortness of breath, hemoptysis Cardiovascular: See history of present illness Positive for mild shortness of breath with activity, unchanged. GI: Negative; No nausea, vomiting, diarrhea, or abdominal pain GU: Negative; No dysuria, hematuria, or difficulty voiding Musculoskeletal: Negative; no myalgias, joint pain, or weakness Hematologic/Oncology: Negative; no easy bruising, bleeding Endocrine: Negative; no heat/cold intolerance; no diabetes Neuro: Negative; no changes in balance, headaches Skin: Negative; No rashes or skin lesions Psychiatric: Negative; No behavioral problems, depression Sleep: Positive for complex sleep apnea, on therapy; No snoring, daytime sleepiness, hypersomnolence, bruxism, restless legs, hypnogognic hallucinations, no cataplexy Other comprehensive 14 point system review is negative.   PE BP (!) 160/80   Pulse 76   Ht '4\' 11"'  (1.499 m)   Wt 153 lb (69.4 kg)   BMI 30.90 kg/m    Repeat blood pressure by me was 170/80  Wt Readings from Last 3 Encounters:  05/03/17 153 lb (69.4 kg)  12/20/16 150 lb (68 kg)  10/29/16 152 lb 12.8 oz (69.3 kg)   General: Alert, oriented, no distress.  Skin: normal turgor, no rashes, warm and dry HEENT: Normocephalic, atraumatic. Pupils equal round and reactive to light; sclera anicteric; extraocular muscles intact;  Nose without nasal septal hypertrophy Mouth/Parynx benign; Mallinpatti scale 3 Neck: No JVD, no carotid bruits;  normal carotid upstroke Lungs: clear to ausculatation and percussion; no wheezing or rales Chest wall: without tenderness to palpitation Heart: PMI not displaced, RRR, s1 s2 normal, 1/6 systolic murmur, no diastolic murmur, no rubs, gallops, thrills, or heaves Abdomen: soft, nontender; no hepatosplenomehaly, BS+; abdominal aorta nontender and not dilated by palpation. Back: no CVA tenderness Pulses 2+ Musculoskeletal: full range of motion, normal strength, no joint deformities Extremities: no clubbing cyanosis or edema, Homan's sign negative  Neurologic: grossly nonfocal; Cranial nerves grossly wnl Psychologic: Normal mood and affect   ECG (independently read by me): Normal sinus rhythm at 76 bpm.  Nonspecific T-wave changes.  March 2018  ECG (independently read by me): Normal sinus rhythm at 68 bpm.  Normal intervals.  Poor R wave progression anteriorly.  October 2017 ECG (independently read by me): Normal sinus rhythm at 66 bpm.  Poor R wave progression.  Normal intervals.  October 2016 ECG (independently read by me): Normal sinus rhythm at 66 bpm.  Nonspecific T changes.  September 2015 ECG (independently read by me): Normal sinus rhythm at 62 beats per minute.  Poor progression anteriorly.  Nonspecific ST changes  12/03/2013 ECG (independently read by me): Normal sinus rhythm at 62 beats per minute.  Nonspecific ST changes.  No ectopy  LABS:  BMP Latest Ref Rng & Units 05/03/2017 01/16/2017 06/11/2016  Glucose 65 - 99 mg/dL 80 80 86  BUN 8 - 27 mg/dL '17 21 24  ' Creatinine 0.57 - 1.00 mg/dL 1.18(H) 1.35(H) 1.35(H)  BUN/Creat Ratio 12 - 28 14 - -  Sodium 134 - 144 mmol/L 141 140 139  Potassium 3.5 - 5.2 mmol/L 4.6 4.4 4.2  Chloride 96 - 106 mmol/L 104 103 102  CO2 20 - 29 mmol/L '21 27 27  ' Calcium 8.7 - 10.3 mg/dL 9.4 9.0 9.7   Hepatic Function Latest Ref Rng & Units 05/03/2017 01/16/2017 06/11/2016  Total Protein 6.0 - 8.5 g/dL 7.1 7.1 7.3  Albumin 3.5 - 4.7 g/dL 4.1 3.8 3.9  AST  0 - 40 IU/L '17 18 15  ' ALT 0 - 32 IU/L '13 16 14  ' Alk Phosphatase 39 - 117 IU/L 75 62 66  Total Bilirubin 0.0 - 1.2 mg/dL 0.4 0.4 0.5   CBC Latest Ref Rng & Units 05/03/2017 06/11/2016 03/30/2015  WBC 3.4 - 10.8 x10E3/uL 8.1 8.4 8.3  Hemoglobin 11.1 - 15.9 g/dL 13.7 13.9 14.9  Hematocrit 34.0 - 46.6 % 41.2 41.1 43.1  Platelets 150 - 379 x10E3/uL 304 283 324   Lab Results  Component Value Date   MCV 88 05/03/2017   MCV 89.7 06/11/2016   MCV 88.3 03/30/2015   Lab Results  Component Value Date   TSH 0.880 05/03/2017   No results found for: HGBA1C  Lipid Panel     Component Value Date/Time   CHOL 214 (H) 05/03/2017 1229   TRIG 97 05/03/2017 1229   HDL 63 05/03/2017 1229   CHOLHDL 3.4 05/03/2017 1229   CHOLHDL 2.9 01/16/2017 0817   VLDL 31 (H) 01/16/2017 0817   LDLCALC 132 (H) 05/03/2017 1229    RADIOLOGY: No results found.  IMPRESSION:  1. Hyperlipidemia, unspecified hyperlipidemia type   2. Medication management   3. Essential hypertension   4. OSA (obstructive sleep apnea)   5. DOE (dyspnea on exertion)   6. Coronary artery disease involving native coronary artery of native heart without angina pectoris     ASSESSMENT AND PLAN: Jo Hendrix is a very pleasant young appearing 83 year old female who has a long-standing history of hypertension and has complex sleep apnea on BiPAP therapy.  Previously I changed her losartan HCT to valsartan HCT 320/12.5 mg.  her blood pressure today is significantly elevated and when taken by the nurse was 160/80 in by me 170/80.  She tells me because of fatigue and weakness.  She stopped taking her medications for the past 4 days.  I am recommending the addition of amlodipine 5 mg to medical regimen and will discontinue the valsartan HCT.  I am scheduling her for 2-D echo Doppler study to evaluate both systolic and diastolic function.  She has been statin intolerant.  Recently has been on low-dose Crestor at at bedtime 5 mg twice a week,  which she has been able to tolerate.  In June 2018 LDL was increased at 103.  We will continue to monitor blood pressure.  If her blood pressure continues to be elevated, she may be a candidate for spironolactone.  I will recheck fasting laboratory and included a BNP.  She will continue to use BiPAP and ultimately may need a new machine.  Will try to obtain a download.  I'm rescheduling her for an echo Doppler study to assess systolic and diastolic function.  I will see her in 6 weeks for reevaluation.  Time spent: 25 minutes  Troy Sine, MD, [facility]  05/05/2017 9:05 PM

## 2017-05-03 NOTE — Patient Instructions (Signed)
Medication Instructions:  STOP valsartan/HCTZ  START amlodipine (Norvasc) 5mg  daily-rx sent to pharmacy  Labwork: TODAY (CBC, CMET, BNP, Lipid, TSH)  Testing/Procedures: Your physician has requested that you have an echocardiogram. Echocardiography is a painless test that uses sound waves to create images of your heart. It provides your doctor with information about the size and shape of your heart and how well your heart's chambers and valves are working. This procedure takes approximately one hour. There are no restrictions for this procedure.  This will be done at our [facility] location:  [place]: Your physician recommends that you schedule a follow-up appointment in: [place] with Dr. Claiborne Billings.   Any Other Special Instructions Will Be Listed Below (If Applicable).     If you need a refill on your cardiac medications before your next appointment, please call your pharmacy.

## 2017-05-04 LAB — CBC
HEMATOCRIT: 41.2 % (ref 34.0–46.6)
Hemoglobin: 13.7 g/dL (ref 11.1–15.9)
MCH: 29.4 pg (ref 26.6–33.0)
MCHC: 33.3 g/dL (ref 31.5–35.7)
MCV: 88 fL (ref 79–97)
Platelets: 304 10*3/uL (ref 150–379)
RBC: 4.66 x10E6/uL (ref 3.77–5.28)
RDW: 14.6 % (ref 12.3–15.4)
WBC: 8.1 10*3/uL (ref 3.4–10.8)

## 2017-05-04 LAB — LIPID PANEL
Chol/HDL Ratio: 3.4 ratio (ref 0.0–4.4)
Cholesterol, Total: 214 mg/dL — ABNORMAL HIGH (ref 100–199)
HDL: 63 mg/dL (ref >39)
LDL Calculated: 132 mg/dL — ABNORMAL HIGH (ref 0–99)
TRIGLYCERIDES: 97 mg/dL (ref 0–149)
VLDL Cholesterol Cal: 19 mg/dL (ref 5–40)

## 2017-05-04 LAB — COMPREHENSIVE METABOLIC PANEL
ALK PHOS: 75 IU/L (ref 39–117)
ALT: 13 IU/L (ref 0–32)
AST: 17 IU/L (ref 0–40)
Albumin/Globulin Ratio: 1.4 (ref 1.2–2.2)
Albumin: 4.1 g/dL (ref 3.5–4.7)
BILIRUBIN TOTAL: 0.4 mg/dL (ref 0.0–1.2)
BUN/Creatinine Ratio: 14 (ref 12–28)
BUN: 17 mg/dL (ref 8–27)
CHLORIDE: 104 mmol/L (ref 96–106)
CO2: 21 mmol/L (ref 20–29)
CREATININE: 1.18 mg/dL — AB (ref 0.57–1.00)
Calcium: 9.4 mg/dL (ref 8.7–10.3)
GFR calc Af Amer: 48 mL/min/{1.73_m2} — ABNORMAL LOW (ref >59)
GFR calc non Af Amer: 41 mL/min/{1.73_m2} — ABNORMAL LOW (ref >59)
GLUCOSE: 80 mg/dL (ref 65–99)
Globulin, Total: 3 g/dL (ref 1.5–4.5)
Potassium: 4.6 mmol/L (ref 3.5–5.2)
Sodium: 141 mmol/L (ref 134–144)
Total Protein: 7.1 g/dL (ref 6.0–8.5)

## 2017-05-04 LAB — PRO B NATRIURETIC PEPTIDE: NT-Pro BNP: 224 pg/mL (ref 0–738)

## 2017-05-04 LAB — TSH: TSH: 0.88 u[IU]/mL (ref 0.450–4.500)

## 2017-05-06 ENCOUNTER — Ambulatory Visit (INDEPENDENT_AMBULATORY_CARE_PROVIDER_SITE_OTHER): Payer: Medicare HMO | Admitting: Podiatry

## 2017-05-06 ENCOUNTER — Encounter: Payer: Self-pay | Admitting: Podiatry

## 2017-05-06 DIAGNOSIS — D3610 Benign neoplasm of peripheral nerves and autonomic nervous system, unspecified: Secondary | ICD-10-CM | POA: Diagnosis not present

## 2017-05-06 NOTE — Patient Instructions (Signed)
Pre-Operative Instructions  Congratulations, you have decided to take an important step towards improving your quality of life.  You can be assured that the doctors and staff at [facility] will be with you every step of the way.  Here are some important things you should know:  1. Plan to be at the surgery center/hospital at least 1 (one) hour prior to your scheduled time, unless otherwise directed by the surgical center/hospital staff.  You must have a responsible adult accompany you, remain during the surgery and drive you home.  Make sure you have directions to the surgical center/hospital to ensure you arrive on time. 2. If you are having surgery at [facility] or [facility] hospitals, you will need a copy of your medical history and physical form from your family physician within one month prior to the date of surgery. We will give you a form for your primary physician to complete.  3. We make every effort to accommodate the date you request for surgery.  However, there are times where surgery dates or times have to be moved.  We will contact you as soon as possible if a change in schedule is required.   4. No aspirin/ibuprofen for one week before surgery.  If you are on aspirin, any non-steroidal anti-inflammatory medications (Mobic, Aleve, Ibuprofen) should not be taken seven (7) days prior to your surgery.  You make take Tylenol for pain prior to surgery.  5. Medications - If you are taking daily heart and blood pressure medications, seizure, reflux, allergy, asthma, anxiety, pain or diabetes medications, make sure you notify the surgery center/hospital before the day of surgery so they can tell you which medications you should take or avoid the day of surgery. 6. No food or drink after midnight the night before surgery unless directed otherwise by surgical center/hospital staff. 7. No alcoholic beverages 24-hours prior to surgery.  No smoking 24-hours prior or 24-hours after  surgery. 8. Wear loose pants or shorts. They should be loose enough to fit over bandages, boots, and casts. 9. Don't wear slip-on shoes. Sneakers are preferred. 10. Bring your boot with you to the surgery center/hospital.  Also bring crutches or a walker if your physician has prescribed it for you.  If you do not have this equipment, it will be provided for you after surgery. 11. If you have not been contacted by the surgery center/hospital by the day before your surgery, call to confirm the date and time of your surgery. 12. Leave-time from work may vary depending on the type of surgery you have.  Appropriate arrangements should be made prior to surgery with your employer. 13. Prescriptions will be provided immediately following surgery by your doctor.  Fill these as soon as possible after surgery and take the medication as directed. Pain medications will not be refilled on weekends and must be approved by the doctor. 14. Remove nail polish on the operative foot and avoid getting pedicures prior to surgery. 15. Wash the night before surgery.  The night before surgery wash the foot and leg well with water and the antibacterial soap provided. Be sure to pay special attention to beneath the toenails and in between the toes.  Wash for at least three (3) minutes. Rinse thoroughly with water and dry well with a towel.  Perform this wash unless told not to do so by your physician.  Enclosed: 1 Ice pack (please put in freezer the night before surgery)   1 Hibiclens skin cleaner     Pre-op instructions  If you have any questions regarding the instructions, please do not hesitate to call our office.  [place]: [address], [place], [place] [address] -- [phone]  [place]: [address] [place], [place] [address] -- [phone]  [place]: [address]  [place], [place] [address] -- [phone]  [place]: [address], [address], [place], [place] [address] -- [phone]  Website:  [email or web address] 

## 2017-05-07 DIAGNOSIS — G4733 Obstructive sleep apnea (adult) (pediatric): Secondary | ICD-10-CM | POA: Diagnosis not present

## 2017-05-07 DIAGNOSIS — J430 Unilateral pulmonary emphysema [MacLeod's syndrome]: Secondary | ICD-10-CM | POA: Diagnosis not present

## 2017-05-07 DIAGNOSIS — R0600 Dyspnea, unspecified: Secondary | ICD-10-CM | POA: Diagnosis not present

## 2017-05-07 NOTE — Progress Notes (Signed)
Subjective:    Patient ID: Jo Hendrix, female   DOB: 83 y.o.   MRN: [id number]   HPI patient states she's having a lot of pain around this left foot and it's not responded to injections    ROS      Objective:  Physical Exam neurovascular status intact with patient well oriented 3 and found to have exquisite discomfort third interspace left which makes shoe gear very difficult for her     Assessment:    Neuroma symptomatology third interspace left present     Plan:   Reviewed condition and recommended continued elevation anti-inflammatories and we discussed long-term surgical intervention. Patient wants to have this fixed and at this point does not want anymore injections so I allowed her to read consent form going over alternative treatments complications. Patient is willing to accept risk of surgery and after extensive review signs consent form understanding recovery can take 6 months and that there is no long-term guarantees this will solve the problem. Patient wants this and is scheduled for outpatient surgery

## 2017-05-08 ENCOUNTER — Telehealth: Payer: Self-pay | Admitting: *Deleted

## 2017-05-08 NOTE — Telephone Encounter (Signed)
I called the patient to verify her surgery date 06-11-2017.  She stated she was told her surgery will be done on October 30.

## 2017-05-13 ENCOUNTER — Ambulatory Visit: Payer: Medicare HMO | Admitting: Podiatry

## 2017-05-13 ENCOUNTER — Telehealth: Payer: Self-pay | Admitting: Cardiovascular Disease

## 2017-05-13 NOTE — Telephone Encounter (Signed)
°  New Prob  Pt c/o swelling: STAT is pt has developed SOB within 24 hours  1) How much weight have you gained and in what time span? Pt is unsure  2) If swelling, where is the swelling located? Feet bilaterally at the end of the day  3) Are you currently taking a fluid pill? No  4) Are you currently SOB? Yes  5) Do you have a log of your daily weights (if so, list)? No  6) Have you gained 3 pounds in a day or 5 pounds in a week? Pt is unsure  7) Have you traveled recently? No

## 2017-05-13 NOTE — Telephone Encounter (Addendum)
Pt of Dr. Claiborne Billings  She notes recent medication change Was seen on 9/21, her Valsartan-HCTZ 320-12.5mg  was d/c'ed and she was started on amlodipine 5mg .  Now reports that starting about the 5th day on this medication, she has had bilateral foot swelling in the evening. Per her discussion w Dr. Claiborne Billings, aware this may happen.  Notes the swelling is resolved in AM, she doesn't really notice it during the day, but it's noticeable by evening.  She is checking BP as advised. States BP is 120s-130s/70s  136/72, 126/72, etc  Pt denies new shortness of breath, fatigue, etc. Aware Dr. Claiborne Billings out of office today, states no urgency but would prefer a call as soon as any advice communicated. Routed to MD to review.  Please advise if any med changes recommended - reintroduce HCTZ at prior dose?

## 2017-05-14 ENCOUNTER — Other Ambulatory Visit: Payer: Self-pay | Admitting: *Deleted

## 2017-05-14 NOTE — Telephone Encounter (Signed)
Try leg elevation.  If her edema increases, she will need to be started on low-dose diuretic therapy.

## 2017-05-14 NOTE — Telephone Encounter (Signed)
Pt called,said she waiting to hear something.

## 2017-05-14 NOTE — Telephone Encounter (Signed)
Returned call to patient, aware of recommendations.   Aware to also try compression stockings per Dr. Claiborne Billings.  Advised to call back if swelling persist or increases.  Patient aware and verbalized understanding.

## 2017-05-16 ENCOUNTER — Telehealth: Payer: Self-pay | Admitting: Cardiovascular Disease

## 2017-05-16 MED ORDER — ROSUVASTATIN CALCIUM 5 MG PO TABS: 5.0000 mg | ORAL_TABLET | ORAL | 1 refills | Status: DC

## 2017-05-16 MED ORDER — AMLODIPINE BESYLATE 5 MG PO TABS: 5.0000 mg | ORAL_TABLET | Freq: Every day | ORAL | 3 refills | Status: DC

## 2017-05-16 NOTE — Telephone Encounter (Signed)
New message     Patient request to have  meds refilled thru mail order   1. Which medications need to be refilled? (please list name of each medication and dose if known) amLODipine (NORVASC) 5 MG tablet, rosuvastatin (CRESTOR) 5 MG tablet 2. Which pharmacy/location (including street and city if local pharmacy) is medication to be sent to? [facility] mail order  3. Do they need a 30 day or 90 day supply? [place]

## 2017-05-17 ENCOUNTER — Ambulatory Visit (HOSPITAL_COMMUNITY): Payer: Medicare HMO | Attending: Cardiovascular Disease

## 2017-05-17 ENCOUNTER — Other Ambulatory Visit: Payer: Self-pay

## 2017-05-17 DIAGNOSIS — R0600 Dyspnea, unspecified: Secondary | ICD-10-CM | POA: Diagnosis not present

## 2017-05-17 DIAGNOSIS — I10 Essential (primary) hypertension: Secondary | ICD-10-CM | POA: Diagnosis not present

## 2017-05-17 DIAGNOSIS — Z87891 Personal history of nicotine dependence: Secondary | ICD-10-CM | POA: Insufficient documentation

## 2017-05-17 DIAGNOSIS — G4733 Obstructive sleep apnea (adult) (pediatric): Secondary | ICD-10-CM | POA: Insufficient documentation

## 2017-05-17 DIAGNOSIS — I2510 Atherosclerotic heart disease of native coronary artery without angina pectoris: Secondary | ICD-10-CM | POA: Diagnosis not present

## 2017-05-17 DIAGNOSIS — I082 Rheumatic disorders of both aortic and tricuspid valves: Secondary | ICD-10-CM | POA: Insufficient documentation

## 2017-05-17 DIAGNOSIS — R0609 Other forms of dyspnea: Secondary | ICD-10-CM

## 2017-05-20 DIAGNOSIS — Z1231 Encounter for screening mammogram for malignant neoplasm of breast: Secondary | ICD-10-CM | POA: Diagnosis not present

## 2017-05-20 DIAGNOSIS — N958 Other specified menopausal and perimenopausal disorders: Secondary | ICD-10-CM | POA: Diagnosis not present

## 2017-05-20 DIAGNOSIS — M8588 Other specified disorders of bone density and structure, other site: Secondary | ICD-10-CM | POA: Diagnosis not present

## 2017-05-24 ENCOUNTER — Telehealth: Payer: Self-pay | Admitting: Cardiovascular Disease

## 2017-05-24 ENCOUNTER — Telehealth: Payer: Self-pay | Admitting: *Deleted

## 2017-05-24 NOTE — Telephone Encounter (Signed)
New message    Call from Leanna Sato at [facility] ext [phone] Calling to confirm  bipap order. Please call.

## 2017-05-24 NOTE — Telephone Encounter (Signed)
Download obtained and reviewed by Dr. Claiborne Billings. No changed recommended at this time.  Orders for new Bipap Machine faxed to [facility].

## 2017-05-31 NOTE — Telephone Encounter (Signed)
Returned call to Huey Romans- left message to call back

## 2017-06-04 ENCOUNTER — Telehealth: Payer: Self-pay | Admitting: *Deleted

## 2017-06-04 NOTE — Telephone Encounter (Signed)
"  The nurse spoke to the patient and stated that the patient told her she's supposed to have surgery on her left foot instead of the right foot.  All of her paperwork says right foot."  From looking at her medical records, it is the left foot.  I will let Dr. Paulla Dolly know.  The consent can be changed the day of surgery.  "So you want me to change it to the left foot?"  Yes, change it to the left foot, that's the foot he has been treating.

## 2017-06-10 DIAGNOSIS — H26491 Other secondary cataract, right eye: Secondary | ICD-10-CM | POA: Diagnosis not present

## 2017-06-10 DIAGNOSIS — H04123 Dry eye syndrome of bilateral lacrimal glands: Secondary | ICD-10-CM | POA: Diagnosis not present

## 2017-06-10 DIAGNOSIS — H43391 Other vitreous opacities, right eye: Secondary | ICD-10-CM | POA: Diagnosis not present

## 2017-06-11 ENCOUNTER — Encounter: Payer: Self-pay | Admitting: Podiatry

## 2017-06-11 ENCOUNTER — Telehealth: Payer: Self-pay | Admitting: Podiatry

## 2017-06-11 DIAGNOSIS — G5762 Lesion of plantar nerve, left lower limb: Secondary | ICD-10-CM | POA: Diagnosis not present

## 2017-06-11 DIAGNOSIS — I10 Essential (primary) hypertension: Secondary | ICD-10-CM | POA: Diagnosis not present

## 2017-06-11 NOTE — Telephone Encounter (Signed)
Jo Hendrix states the rx problem was taken care of by the surgical center.

## 2017-06-11 NOTE — Telephone Encounter (Signed)
This is Robin calling from [employer]. Pt brought in a prescription that Dr. Paulla Dolly wrote for Vicodin 5-325 but the current formula only comes in 5-300. If that is okay, we can change that over the phone since we're just changing the mg. If he wants Norco which is 5-325 he would have to re-write the prescription. If he just wants to change the dosage to the 5-300, you can call us back at [phone]. Thank you.

## 2017-06-12 ENCOUNTER — Telehealth: Payer: Self-pay | Admitting: Podiatry

## 2017-06-12 NOTE — Telephone Encounter (Signed)
I had surgery on my left foot by Dr. Paulla Dolly yesterday and I was wanting to know the name of the procedure. If someone could please call me back at [phone]. Thank you.

## 2017-06-12 NOTE — Telephone Encounter (Signed)
I informed pt Dr. Paulla Dolly performed a neurectomy.

## 2017-06-13 ENCOUNTER — Encounter: Payer: Self-pay | Admitting: Cardiovascular Disease

## 2017-06-13 ENCOUNTER — Ambulatory Visit (INDEPENDENT_AMBULATORY_CARE_PROVIDER_SITE_OTHER): Payer: Medicare HMO | Admitting: Cardiovascular Disease

## 2017-06-13 VITALS — BP 150/74 | HR 68 | Ht 59.0 in | Wt 155.2 lb

## 2017-06-13 DIAGNOSIS — I519 Heart disease, unspecified: Secondary | ICD-10-CM | POA: Diagnosis not present

## 2017-06-13 DIAGNOSIS — I10 Essential (primary) hypertension: Secondary | ICD-10-CM

## 2017-06-13 DIAGNOSIS — I2510 Atherosclerotic heart disease of native coronary artery without angina pectoris: Secondary | ICD-10-CM | POA: Diagnosis not present

## 2017-06-13 DIAGNOSIS — E785 Hyperlipidemia, unspecified: Secondary | ICD-10-CM | POA: Diagnosis not present

## 2017-06-13 DIAGNOSIS — M25473 Effusion, unspecified ankle: Secondary | ICD-10-CM

## 2017-06-13 DIAGNOSIS — G4733 Obstructive sleep apnea (adult) (pediatric): Secondary | ICD-10-CM | POA: Diagnosis not present

## 2017-06-13 DIAGNOSIS — R0609 Other forms of dyspnea: Secondary | ICD-10-CM | POA: Diagnosis not present

## 2017-06-13 DIAGNOSIS — I5189 Other ill-defined heart diseases: Secondary | ICD-10-CM

## 2017-06-13 MED ORDER — HYDROCHLOROTHIAZIDE 12.5 MG PO CAPS: 12.5000 mg | ORAL_CAPSULE | Freq: Every day | ORAL | 3 refills | Status: DC

## 2017-06-13 NOTE — Patient Instructions (Signed)
Medication Instructions:  START HCTZ 12.5 mg (1 tablet) daily  Follow-Up: Your physician wants you to follow-up in: 6 months with Dr. Claiborne Billings. You will receive a reminder letter in the mail two months in advance. If you don't receive a letter, please call our office to schedule the follow-up appointment.   Any Other Special Instructions Will Be Listed Below (If Applicable).     If you need a refill on your cardiac medications before your next appointment, please call your pharmacy.

## 2017-06-13 NOTE — Progress Notes (Signed)
Patient ID: Jo Hendrix, female   DOB: 1927/02/15, 83 y.o.   MRN: [id number]    PCP: Dr. Jonathon Jordan  HPI: Jo Hendrix is a 83 y.o. female who presents to the office for a 2 month cardiology evaluation.  Jo Hendrix has a long-standing history of hypertension with documented grade 1 diastolic dysfunction.  She also has a history of complex sleep apnea with documented mild pulmonary hypertension and has been on BiPAP. In October 2008 she underwent cardiac catheterization after developing recurrent episodes of chest pain.  This showed mild coronary obstructive disease with 20% narrowing of the proximal LAD with mid LAD systolic bridging and diffuse luminal irregularities of 40-50% in the RCA.  She has had difficulty with shortness of breath and has been diagnosed with COPD/emphysema.  A cardiopulmonary met test  revealed normal functional status with peak oxygen consumption 105%, but she had an abnormal pulmonary response with no ventilation perfusion mismatch and with plus/minus dead space.  She has been intolerant to statin therapy as well as Zetia.  She continues to use BiPAP for her complex sleep apnea.  When I last saw her 2 months ago she had noticed progressive increasing shortness of breath with activity and had experienced some very mild chest tightness when she is short of breath.  She also admitted to some mild lower extremity edema particularly involving the left leg by the end of the day.    Aa nuclear stress test on 04/08/2015 was low risk.  Post-rest ejection fraction was 71%.  There was normal perfusion.  An echo Doppler study on 04/07/2015 showed an ejection fraction at 55-60%.  There was grade 1 diastolic dysfunction.  There was mild mitral annular calcification with trace MR and trace TR.  She stopped taking statin therapy.  She had blood work done by [facility] 1 Jerry 15 2018, which I reviewed.  TSH was normal at 0.98.  Vitamin D was 68.8.  Lipid studies were significantly  increased with a total cholesterol 239, LDL 142, non-HDL 168, triglycerides 127, and HDL 71.  She continues to use her BiPAP for previously documented complex sleep apnea..   When I last saw her several months ago.  She denied any episodes of chest pain but was experiencing some shortness of breath with activity, which had not significantly changed.  She underwent a follow-up echo Doppler study on 05/17/2017.  This continued to show normal systolic function with an ejection fraction of 60-65%.  There was grade 1 diastolic dysfunction.  There was moderate aortic insufficiency without stenosis, trivial MR, mild TR, moderate pulmonary regurgitation, and mild elevation of PA peak pressure 33 mm.  Recently she has noticed some mild ankle swelling.  She and surgery of her left foot with a neurectomy.  She continues to use BiPAP with 100% compliance and her DME company is [employer].  I was able to obtain a download from August 25 3 05/05/2017.  This showed 100% compliance.  AHI was 3.3.  However, her sleep duration was reduced at only 4 hours and 29 minutes per night.  She's had some issues with sleep maintenance.  She typically goes to bed between 11 and 11:30.  Oftentimes she does not put the mask on when she would return from the bathroom.  She presents for evaluation  Past Medical History:  Diagnosis Date  . ACE-inhibitor cough   . COPD (chronic obstructive pulmonary disease) ([place])   . Diastolic dysfunction   . History of tobacco use  smoked 25 years  . Hypertension   . Mild CAD   . Mild pulmonary hypertension ([place])   . OSA (obstructive sleep apnea)    Sleep Study 04/07/2007 - AHI during total sleep 21.30/hr, during REM 20.16/hr - BiPAP auto servo-ventilation unit    Past Surgical History:  Procedure Laterality Date  . benign lesion removed on lung  2000  . BLADDER REPAIR    . CARDIAC CATHETERIZATION  05/20/2007   mild coronary obstructive disease with 20% narrowing in prox LAD, diffuse luminal  irregularity of 40-50% in mid RCA (Dr. Corky Downs)  . Cardiopulmonary Met Test  01/28/2012   with PFTs - FEV1 & FEV1/VC WNL, VC WNL, DLCO WNL; abnormal pulmonary response  . Carotid Doppler  04/2011   normal patency  . CARPAL TUNNEL RELEASE     x2  . CATARACT EXTRACTION, BILATERAL    . GALLBLADDER SURGERY    . NM MYOCAR PERF WALL MOTION  09/2011   lexiscan myoview - normal perfusion, EF 77%, low risk  . Renal Doppler  05/2007   normal renal arteries   . TONSILLECTOMY    . TRANSTHORACIC ECHOCARDIOGRAM  2013   EF 50-55%; mild MR; mild TR; mild pulm valve regurg; aortic root sclerosis/calcification  . VAGINAL HYSTERECTOMY    . VARICOSE VEIN SURGERY      Allergies  Allergen Reactions  . Zetia [Ezetimibe] Other (See Comments)    Chest pressure  . Ace Inhibitors Cough  . Codeine Other (See Comments)    difficulty urinating  . Erythromycin Other (See Comments)    DOESN'T REMEMBER   . Ivp Dye [Iodinated Diagnostic Agents] Hives    hives    Current Outpatient Prescriptions  Medication Sig Dispense Refill  . amLODipine (NORVASC) 5 MG tablet Take 1 tablet (5 mg total) by mouth daily. 30 tablet 3  . aspirin 81 MG tablet Take 81 mg by mouth daily.    . Calcium 1500 MG tablet Take 1,500 mg by mouth daily.    . cholecalciferol (VITAMIN D) 1000 UNITS tablet Take 1 tablet by mouth daily. Take 1 tab daily    . NON FORMULARY at bedtime. BiPAP    . Omega-3 Fatty Acids (FISH OIL) 1000 MG CAPS Take 1 capsule by mouth 3 (three) times daily.    . RESTASIS 0.05 % ophthalmic emulsion Place 1 drop into both eyes 2 (two) times daily. Use 1 drop in each eye twice a day    . rosuvastatin (CRESTOR) 5 MG tablet Take 1 tablet (5 mg total) by mouth every other day. 38 tablet 1  . hydrochlorothiazide (MICROZIDE) 12.5 MG capsule Take 1 capsule (12.5 mg total) by mouth daily. 90 capsule 3   No current facility-administered medications for this visit.    Facility-Administered Medications Ordered in Other Visits    Medication Dose Route Frequency Provider Last Rate Last Dose  . aminophylline injection 75 mg  75 mg Intravenous TID PRN Larey Dresser, MD   75 mg at 04/07/15 1105    Social History   Social History  . Marital status: Widowed    Spouse name: N/A  . Number of children: 3  . Years of education: N/A   Occupational History  . retired    Social History Main Topics  . Smoking status: Former Smoker    Packs/day: 0.50    Years: 25.00    Types: Cigarettes    Quit date: 08/13/1984  . Smokeless tobacco: Never Used  . Alcohol use No  .  Drug use: No  . Sexual activity: Not on file   Other Topics Concern  . Not on file   Social History Narrative  . No narrative on file    Socially, she is widowed, has 3 children, 8 grandchildren 5 great-grandchildren.  There is a remote tobacco history but she quit many years ago.  Family History  Problem Relation Age of Onset  . Lung cancer Mother   . Suicidality Father   . Cancer Sister   . Breast cancer Daughter   . Stroke Sister    ROS General: Negative; No fevers, chills, or night sweats;  HEENT: Negative; No changes in vision or hearing, sinus congestion, difficulty swallowing Pulmonary: Negative; No cough, wheezing, shortness of breath, hemoptysis Cardiovascular: See history of present illness Positive for mild shortness of breath with activity, unchanged.;  Admits to ankle edema GI: Negative; No nausea, vomiting, diarrhea, or abdominal pain GU: Negative; No dysuria, hematuria, or difficulty voiding Musculoskeletal: Negative; no myalgias, joint pain, or weakness Hematologic/Oncology: Negative; no easy bruising, bleeding Endocrine: Negative; no heat/cold intolerance; no diabetes Neuro: Negative; no changes in balance, headaches Skin: Negative; No rashes or skin lesions Psychiatric: Negative; No behavioral problems, depression Sleep: Positive for complex sleep apnea, on therapy; No snoring, daytime sleepiness, hypersomnolence,  bruxism, restless legs, hypnogognic hallucinations, no cataplexy Other comprehensive 14 point system review is negative.   PE BP (!) 150/74   Pulse 68   Ht _0  (1.499 m)   Wt 155 lb 3.2 oz (70.4 kg)   BMI 31.35 kg/m    Repeat blood pressure by me was 142/76  Wt Readings from Last 3 Encounters:  06/13/17 155 lb 3.2 oz (70.4 kg)  05/03/17 153 lb (69.4 kg)  12/20/16 150 lb (68 kg)   General: Alert, oriented, no distress.  Skin: normal turgor, no rashes, warm and dry HEENT: Normocephalic, atraumatic. Pupils equal round and reactive to light; sclera anicteric; extraocular muscles intact;  Nose without nasal septal hypertrophy Mouth/Parynx benign; Mallinpatti scale 3 Neck: No JVD, no carotid bruits; normal carotid upstroke Lungs: clear to ausculatation and percussion; no wheezing or rales Chest wall: without tenderness to palpitation Heart: PMI not displaced, RRR, s1 s2 normal, 1/6 systolic murmur, 1/6 diastolic murmur, no rubs, gallops, thrills, or heaves Abdomen: soft, nontender; no hepatosplenomehaly, BS+; abdominal aorta nontender and not dilated by palpation. Back: no CVA tenderness Pulses 2+ Musculoskeletal: full range of motion, normal strength, no joint deformities Extremities: Left foot in a brace after her neurectomy; trace ankle edema no clubbing, cyanosis, Homan's sign negative  Neurologic: grossly nonfocal; Cranial nerves grossly wnl Psychologic: Normal mood and affect  ECG (independently read by me): Sinus rhythm at 68 bpm with an isolated PVC, nonspecific T changes.  QTc interval 459 ms.  September 2018 ECG (independently read by me): Normal sinus rhythm at 76 bpm.  Nonspecific T-wave changes.  March 2018  ECG (independently read by me): Normal sinus rhythm at 68 bpm.  Normal intervals.  Poor R wave progression anteriorly.  October 2017 ECG (independently read by me): Normal sinus rhythm at 66 bpm.  Poor R wave progression.  Normal intervals.  October 2016 ECG  (independently read by me): Normal sinus rhythm at 66 bpm.  Nonspecific T changes.  September 2015 ECG (independently read by me): Normal sinus rhythm at 62 beats per minute.  Poor progression anteriorly.  Nonspecific ST changes  12/03/2013 ECG (independently read by me): Normal sinus rhythm at 62 beats per minute.  Nonspecific ST changes.  No ectopy  LABS:  BMP Latest Ref Rng & Units 05/03/2017 01/16/2017 06/11/2016  Glucose 65 - 99 mg/dL 80 80 86  BUN 8 - 27 mg/dL _0 Creatinine 0.57 - 1.00 mg/dL 1.18(H) 1.35(H) 1.35(H)  BUN/Creat Ratio 12 - 28 14 - -  Sodium 134 - 144 mmol/L 141 140 139  Potassium 3.5 - 5.2 mmol/L 4.6 4.4 4.2  Chloride 96 - 106 mmol/L 104 103 102  CO2 20 - 29 mmol/L _1 Calcium 8.7 - 10.3 mg/dL 9.4 9.0 9.7   Hepatic Function Latest Ref Rng & Units 05/03/2017 01/16/2017 06/11/2016  Total Protein 6.0 - 8.5 g/dL 7.1 7.1 7.3  Albumin 3.5 - 4.7 g/dL 4.1 3.8 3.9  AST 0 - 40 IU/L _2 ALT 0 - 32 IU/L _3 Alk Phosphatase 39 - 117 IU/L 75 62 66  Total Bilirubin 0.0 - 1.2 mg/dL 0.4 0.4 0.5   CBC Latest Ref Rng & Units 05/03/2017 06/11/2016 03/30/2015  WBC 3.4 - 10.8 x10E3/uL 8.1 8.4 8.3  Hemoglobin 11.1 - 15.9 g/dL 13.7 13.9 14.9  Hematocrit 34.0 - 46.6 % 41.2 41.1 43.1  Platelets 150 - 379 x10E3/uL 304 283 324   Lab Results  Component Value Date   MCV 88 05/03/2017   MCV 89.7 06/11/2016   MCV 88.3 03/30/2015   Lab Results  Component Value Date   TSH 0.880 05/03/2017   No results found for: HGBA1C  Lipid Panel     Component Value Date/Time   CHOL 214 (H) 05/03/2017 1229   TRIG 97 05/03/2017 1229   HDL 63 05/03/2017 1229   CHOLHDL 3.4 05/03/2017 1229   CHOLHDL 2.9 01/16/2017 0817   VLDL 31 (H) 01/16/2017 0817   LDLCALC 132 (H) 05/03/2017 1229    RADIOLOGY: No results found.  IMPRESSION:  1. Essential hypertension   2. OSA (obstructive sleep apnea)   3. DOE (dyspnea on exertion)   4. Grade I diastolic dysfunction   5.  Hyperlipidemia, unspecified hyperlipidemia type   6. Mild CAD   7. Ankle edema     ASSESSMENT AND PLAN: Jo Hendrix is a very pleasant young appearing 83 year old female who has a long-standing history of hypertension and has complex sleep apnea on BiPAP therapy.  .  Presently, she has only been on amlodipine 5 mg daily.  Remotely she had been on losartan HCT which was changed to valsartan HCT, but when there was concern of valsartan impurity, she discontinued taking the valsartan HCT ecen though it did not affect the valsartan HCT combination.  Her blood pressure today is elevated.  She also has noticed some trace edema.  I will initiate HCTZ 12.5 mg daily or every other day depending upon her leg swelling and blood pressure response.  I reviewed her echo Doppler data.  She continues to have normal systolic function and there is grade 1 diastolic dysfunction which may be contributing to some of her exertional dyspnea.  She's not had any chest tightness or anginal type symptomatology.  Recent laboratory was reviewed and her proBNP was normal at 224, arguing against CHF.  She has stage III kidney disease, and most recent creatinine was improved at 1.18.  She has a history of hyperlipidemia but is intolerant to high-dose statins.  Most recent lipid panel reveals total cholesterol 214 triglycerides 97, HDL 63, and LDL 132.  She seems to be tolerating rosuvastatin 5 mg and this will be increased to every other  day and as tolerated.  Potentially daily.  I reviewed her BiPAP download.  She is using BiPAP with 100% compliance.  However, sleep duration is inadequate.  I told her that one of the problems with her sleep maintenance may be the fact that she is not putting her mask on when she returns from the bathroom and as result, was unable to get back to sleep due to her untreated sleep apnea mask is not on which may be contributing to reduced sleep efficiency point the LAD, a part of the night.  She will try to  increase BiPAP nocturnal use duration and I recommended that ideally she wear the mask for at least 8 hours per night if possible.  I am reducing her ramp time down to 5 minutes.  I will see her in 6 months for reevaluation.  Troy Sine, MD, [facility]  06/15/2017 11:15 AM

## 2017-06-18 ENCOUNTER — Encounter: Payer: Self-pay | Admitting: Podiatry

## 2017-06-19 ENCOUNTER — Ambulatory Visit (INDEPENDENT_AMBULATORY_CARE_PROVIDER_SITE_OTHER): Payer: Medicare HMO | Admitting: Podiatry

## 2017-06-19 ENCOUNTER — Encounter: Payer: Self-pay | Admitting: Podiatry

## 2017-06-19 VITALS — BP 150/84 | HR 74 | Temp 97.3°F

## 2017-06-19 DIAGNOSIS — D3610 Benign neoplasm of peripheral nerves and autonomic nervous system, unspecified: Secondary | ICD-10-CM

## 2017-06-19 NOTE — Progress Notes (Signed)
Subjective:    Patient ID: Jo Hendrix, female   DOB: 83 y.o.   MRN: [id number]   HPI patient presents stating she's doing very well    ROS      Objective:  Physical Exam neurovascular status intact negative Homans sign noted with well coapted incision sites left third intermetatarsal space     Assessment:  Doing well post neurectomy third interspace left       Plan:    Sterile dressing reapplied advised a continued elevation immobilization and reappoint in the next 4-6 weeks

## 2017-06-20 ENCOUNTER — Telehealth: Payer: Self-pay | Admitting: Cardiovascular Disease

## 2017-06-20 NOTE — Telephone Encounter (Signed)
New message  Delila called requesting to speak with RN about pts by. Please call back to discuss

## 2017-06-20 NOTE — Telephone Encounter (Signed)
Spoke to [place] at [facility] order faxed to change Bipap settings.     Aware that orders received prior were for a new machine that patient requested but was going to cost too much out of pocket, therefore patient decided against it.    Order sent to change settings still needs to be applied to current Bipap.     Verbalized understanding.

## 2017-06-21 ENCOUNTER — Telehealth: Payer: Self-pay | Admitting: Cardiovascular Disease

## 2017-06-21 NOTE — Telephone Encounter (Signed)
New message    Patient has question regarding new cpap machine order. Please call.

## 2017-06-21 NOTE — Telephone Encounter (Signed)
Returned call to patient-patient states she is having difficulty getting the answer to how much she would have to pay to get a new machine.  Patient was wondering if I knew a different number to get in touch with someone.   Provided # to Huey Romans that I have and advised unfortunately I am unable to know how much she will be responsible for, this would be something that Huey Romans will have to answer after processed through insurance.    Patient also states she has appt to get setting changed (change that was made at last OV).   Patient appreciated my call and will continue to try to get an answer from [place].  Advised to call back if I can do anything to help her.

## 2017-06-24 ENCOUNTER — Telehealth: Payer: Self-pay | Admitting: Cardiovascular Disease

## 2017-06-24 NOTE — Telephone Encounter (Signed)
Pt is going to need a Split Night Sleep Study in order to get her new machine.

## 2017-06-24 NOTE — Telephone Encounter (Signed)
The patient will need a sleep study prior to getting the new machine. Will route to the nurse for her knowledge.

## 2017-06-25 NOTE — Telephone Encounter (Signed)
Okay to schedule.  Patient s currently on BiPAP and may need a BiPAP titration along with her split-night protocol.

## 2017-07-15 DIAGNOSIS — G479 Sleep disorder, unspecified: Secondary | ICD-10-CM | POA: Diagnosis not present

## 2017-07-15 DIAGNOSIS — B029 Zoster without complications: Secondary | ICD-10-CM | POA: Diagnosis not present

## 2017-07-23 ENCOUNTER — Other Ambulatory Visit: Payer: Self-pay | Admitting: Cardiovascular Disease

## 2017-07-24 ENCOUNTER — Other Ambulatory Visit: Payer: Self-pay | Admitting: Cardiovascular Disease

## 2017-07-24 NOTE — Telephone Encounter (Signed)
REFILL 

## 2017-07-26 ENCOUNTER — Emergency Department (HOSPITAL_COMMUNITY): Payer: Medicare HMO

## 2017-07-26 ENCOUNTER — Emergency Department (HOSPITAL_COMMUNITY)
Admission: EM | Admit: 2017-07-26 | Discharge: 2017-07-27 | Disposition: A | Discharge status: Home / Self Care | Payer: Medicare HMO | Attending: Emergency Medicine | Admitting: Emergency Medicine

## 2017-07-26 ENCOUNTER — Other Ambulatory Visit: Payer: Self-pay

## 2017-07-26 ENCOUNTER — Encounter (HOSPITAL_COMMUNITY): Payer: Self-pay | Admitting: Emergency Medicine

## 2017-07-26 DIAGNOSIS — I2510 Atherosclerotic heart disease of native coronary artery without angina pectoris: Secondary | ICD-10-CM | POA: Diagnosis not present

## 2017-07-26 DIAGNOSIS — R42 Dizziness and giddiness: Secondary | ICD-10-CM

## 2017-07-26 DIAGNOSIS — I5030 Unspecified diastolic (congestive) heart failure: Secondary | ICD-10-CM | POA: Insufficient documentation

## 2017-07-26 DIAGNOSIS — R110 Nausea: Secondary | ICD-10-CM | POA: Insufficient documentation

## 2017-07-26 DIAGNOSIS — R404 Transient alteration of awareness: Secondary | ICD-10-CM | POA: Diagnosis not present

## 2017-07-26 DIAGNOSIS — I10 Essential (primary) hypertension: Secondary | ICD-10-CM | POA: Diagnosis not present

## 2017-07-26 DIAGNOSIS — R531 Weakness: Secondary | ICD-10-CM | POA: Diagnosis not present

## 2017-07-26 DIAGNOSIS — J449 Chronic obstructive pulmonary disease, unspecified: Secondary | ICD-10-CM | POA: Insufficient documentation

## 2017-07-26 DIAGNOSIS — Z7982 Long term (current) use of aspirin: Secondary | ICD-10-CM | POA: Diagnosis not present

## 2017-07-26 DIAGNOSIS — R5383 Other fatigue: Secondary | ICD-10-CM | POA: Diagnosis not present

## 2017-07-26 DIAGNOSIS — Z87891 Personal history of nicotine dependence: Secondary | ICD-10-CM | POA: Diagnosis not present

## 2017-07-26 LAB — COMPREHENSIVE METABOLIC PANEL
ALK PHOS: 68 U/L (ref 38–126)
ALT: 17 U/L (ref 14–54)
AST: 19 U/L (ref 15–41)
Albumin: 3.5 g/dL (ref 3.5–5.0)
Anion gap: 12 (ref 5–15)
BILIRUBIN TOTAL: 0.6 mg/dL (ref 0.3–1.2)
BUN: 17 mg/dL (ref 6–20)
CALCIUM: 9.4 mg/dL (ref 8.9–10.3)
CO2: 24 mmol/L (ref 22–32)
CREATININE: 1.22 mg/dL — AB (ref 0.44–1.00)
Chloride: 99 mmol/L — ABNORMAL LOW (ref 101–111)
GFR, EST AFRICAN AMERICAN: 44 mL/min — AB (ref >60)
GFR, EST NON AFRICAN AMERICAN: 38 mL/min — AB (ref >60)
Glucose, Bld: 111 mg/dL — ABNORMAL HIGH (ref 65–99)
Potassium: 3.5 mmol/L (ref 3.5–5.1)
SODIUM: 135 mmol/L (ref 135–145)
TOTAL PROTEIN: 7.5 g/dL (ref 6.5–8.1)

## 2017-07-26 LAB — CBC
HEMATOCRIT: 42.1 % (ref 36.0–46.0)
Hemoglobin: 14.8 g/dL (ref 12.0–15.0)
MCH: 31 pg (ref 26.0–34.0)
MCHC: 35.2 g/dL (ref 30.0–36.0)
MCV: 88.1 fL (ref 78.0–100.0)
Platelets: 302 10*3/uL (ref 150–400)
RBC: 4.78 MIL/uL (ref 3.87–5.11)
RDW: 15 % (ref 11.5–15.5)
WBC: 9.6 10*3/uL (ref 4.0–10.5)

## 2017-07-26 LAB — PROTIME-INR
INR: 1
Prothrombin Time: 13.1 seconds (ref 11.4–15.2)

## 2017-07-26 LAB — DIFFERENTIAL
Basophils Absolute: 0 10*3/uL (ref 0.0–0.1)
Basophils Relative: 0 %
EOS PCT: 0 %
Eosinophils Absolute: 0 10*3/uL (ref 0.0–0.7)
LYMPHS ABS: 1.3 10*3/uL (ref 0.7–4.0)
LYMPHS PCT: 14 %
MONO ABS: 0.7 10*3/uL (ref 0.1–1.0)
MONOS PCT: 7 %
NEUTROS ABS: 7.6 10*3/uL (ref 1.7–7.7)
Neutrophils Relative %: 79 %

## 2017-07-26 LAB — I-STAT CHEM 8, ED
BUN: 21 mg/dL — ABNORMAL HIGH (ref 6–20)
CALCIUM ION: 1.12 mmol/L — AB (ref 1.15–1.40)
CHLORIDE: 97 mmol/L — AB (ref 101–111)
Creatinine, Ser: 1 mg/dL (ref 0.44–1.00)
GLUCOSE: 114 mg/dL — AB (ref 65–99)
HCT: 46 % (ref 36.0–46.0)
Hemoglobin: 15.6 g/dL — ABNORMAL HIGH (ref 12.0–15.0)
Potassium: 3.5 mmol/L (ref 3.5–5.1)
Sodium: 137 mmol/L (ref 135–145)
TCO2: 28 mmol/L (ref 22–32)

## 2017-07-26 LAB — I-STAT TROPONIN, ED: Troponin i, poc: 0 ng/mL (ref 0.00–0.08)

## 2017-07-26 LAB — APTT: aPTT: 32 seconds (ref 24–36)

## 2017-07-26 MED ORDER — TRAMADOL HCL 50 MG PO TABS
50.0000 mg | ORAL_TABLET | Freq: Once | ORAL | Status: AC
  Administered 2017-07-26: 50 mg via ORAL
  Filled 2017-07-26: qty 1

## 2017-07-26 MED ORDER — MECLIZINE HCL 25 MG PO TABS
12.5000 mg | ORAL_TABLET | Freq: Once | ORAL | Status: AC
  Administered 2017-07-26: 12.5 mg via ORAL
  Filled 2017-07-26: qty 1

## 2017-07-26 MED ORDER — HYDROCODONE-ACETAMINOPHEN 5-325 MG PO TABS
1.0000 | ORAL_TABLET | Freq: Once | ORAL | Status: AC
  Administered 2017-07-26: 1 via ORAL
  Filled 2017-07-26: qty 1

## 2017-07-26 NOTE — ED Notes (Signed)
Resident aware of patient's request for pain medication

## 2017-07-26 NOTE — ED Provider Notes (Signed)
[facility] EMERGENCY DEPARTMENT Provider Note   CSN: [id number] Arrival date & time: 07/26/17  1326     History   Chief Complaint Chief Complaint  Patient presents with  . Dizziness    HPI Jo Hendrix is a 83 y.o. female.  HPI 83 year old female with a history of COPD, diastolic dysfunction, HTN, CAD, diagnosed with shingles 2 weeks ago who finished a course of acyclovir who has been taking tramadol for pain and started a low-dose of gabapentin 3 days ago.  She took a dose of 100 mg on Wednesday and another dose yesterday morning.  Throughout the day yesterday she had intermittent episodes of dizziness and nausea with one episode of emesis yesterday.  No emesis today.  She states that she feels generalized weakness and that she will intermittently have a warm sensation behind her eyes and pressure in her head followed by the sensation that everything is tilting to one side and her feeling off balance.  These episodes last anywhere from a few seconds to a few minutes and then resolve.  They happen at rest but also happen when she stands up or tries to walk.  These happen almost every time she tries to stand up or walk.  Denies vision changes, hearing changes, ear pain, fullness in her ears, chest pain, shortness of breath.  Past Medical History:  Diagnosis Date  . ACE-inhibitor cough   . COPD (chronic obstructive pulmonary disease) ([place])   . Diastolic dysfunction   . History of tobacco use    smoked 25 years  . Hypertension   . Mild CAD   . Mild pulmonary hypertension ([place])   . OSA (obstructive sleep apnea)    Sleep Study 04/07/2007 - AHI during total sleep 21.30/hr, during REM 20.16/hr - BiPAP auto servo-ventilation unit    Patient Active Problem List   Diagnosis Date Noted  . Mild CAD 12/03/2013  . 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 Hyperlipidemia 12/03/2013  . Essential hypertension 12/03/2013  . Dyspnea 03/25/2012  . Pulmonary emphysema ([place])  03/25/2012    Past Surgical History:  Procedure Laterality Date  . benign lesion removed on lung  2000  . BLADDER REPAIR    . CARDIAC CATHETERIZATION  05/20/2007   mild coronary obstructive disease with 20% narrowing in prox LAD, diffuse luminal irregularity of 40-50% in mid RCA (Dr. Corky Downs)  . Cardiopulmonary Met Test  01/28/2012   with PFTs - FEV1 & FEV1/VC WNL, VC WNL, DLCO WNL; abnormal pulmonary response  . Carotid Doppler  04/2011   normal patency  . CARPAL TUNNEL RELEASE     x2  . CATARACT EXTRACTION, BILATERAL    . GALLBLADDER SURGERY    . NM MYOCAR PERF WALL MOTION  09/2011   lexiscan myoview - normal perfusion, EF 77%, low risk  . Renal Doppler  05/2007   normal renal arteries   . TONSILLECTOMY    . TRANSTHORACIC ECHOCARDIOGRAM  2013   EF 50-55%; mild MR; mild TR; mild pulm valve regurg; aortic root sclerosis/calcification  . VAGINAL HYSTERECTOMY    . VARICOSE VEIN SURGERY      OB History    No data available       Home Medications    Prior to Admission medications   Medication Sig Start Date End Date Taking? Authorizing Provider  amLODipine (NORVASC) 5 MG tablet TAKE 1 TABLET EVERY DAY 07/24/17  Yes Troy Sine, MD  aspirin 81 MG tablet Take 81 mg by  mouth daily.   Yes [provider]  Calcium 1500 MG tablet Take 1,500 mg by mouth daily.   Yes [provider]  cholecalciferol (VITAMIN D) 1000 UNITS tablet Take 1 tablet by mouth daily. Take 1 tab daily 12/21/14  Yes [provider]  hydrochlorothiazide (MICROZIDE) 12.5 MG capsule Take 1 capsule (12.5 mg total) by mouth daily. 06/13/17 09/11/17 Yes Troy Sine, MD  Omega-3 Fatty Acids (FISH OIL) 1000 MG CAPS Take 1 capsule by mouth 3 (three) times daily.   Yes [provider]  RESTASIS 0.05 % ophthalmic emulsion Place 1 drop into both eyes 2 (two) times daily. Use 1 drop in each eye twice a day 03/15/15  Yes [provider]  rosuvastatin (CRESTOR) 5 MG tablet TAKE 1  TABLET EVERY OTHER DAY 07/24/17  Yes Troy Sine, MD  traMADol (ULTRAM) 50 MG tablet Take 50 mg by mouth every 6 (six) hours as needed for pain. 07/17/17  Yes [provider]  NON FORMULARY at bedtime. BiPAP    [provider]    Family History Family History  Problem Relation Age of Onset  . Lung cancer Mother   . Suicidality Father   . Cancer Sister   . Breast cancer Daughter   . Stroke Sister     Social History Social History   Tobacco Use  . Smoking status: Former Smoker    Packs/day: 0.50    Years: 25.00    Pack years: 12.50    Types: Cigarettes    Last attempt to quit: 08/13/1984    Years since quitting: 32.9  . Smokeless tobacco: Never Used  Substance Use Topics  . Alcohol use: No  . Drug use: No     Allergies   Zetia [ezetimibe]; Ace inhibitors; Codeine; Erythromycin; and Ivp dye [iodinated diagnostic agents]   Review of Systems Review of Systems  Constitutional: Positive for fatigue. Negative for chills and fever.  HENT: Negative for congestion, ear pain, facial swelling, hearing loss and sore throat.   Eyes: Negative for pain and visual disturbance.  Respiratory: Negative for cough and shortness of breath.   Cardiovascular: Negative for chest pain and palpitations.  Gastrointestinal: Negative for abdominal pain, nausea and vomiting.  Genitourinary: Negative for dysuria and hematuria.  Musculoskeletal: Negative for arthralgias, back pain and neck pain.  Skin: Negative for color change and rash.  Neurological: Positive for dizziness. Negative for seizures and syncope.  All other systems reviewed and are negative.    Physical Exam Updated Vital Signs BP (!) 145/73   Pulse 73   Temp 99 F (37.2 C) (Oral)   Resp (!) 25   Ht '4\' 11"'  (1.499 m)   Wt 68 kg (150 lb)   SpO2 93%   BMI 30.30 kg/m   Physical Exam  Constitutional: She is oriented to person, place, and time. She appears well-developed and well-nourished. No distress.    HENT:  Head: Normocephalic and atraumatic.  Right Ear: Tympanic membrane and ear canal normal.  Left Ear: Tympanic membrane and ear canal normal.  Eyes: Conjunctivae and EOM are normal. Pupils are equal, round, and reactive to light.  Neck: Normal range of motion. Neck supple.  Cardiovascular: Normal rate and regular rhythm.  No murmur heard. Pulmonary/Chest: Effort normal and breath sounds normal. No respiratory distress.  Abdominal: Soft. There is no tenderness.  Musculoskeletal: Normal range of motion. She exhibits no edema.  Neurological: She is alert and oriented to person, place, and time. She has normal  reflexes. GCS eye subscore is 4. GCS verbal subscore is 5. GCS motor subscore is 6.  CN 2-12 grossly intact. Sensation intact throughout in all extremities.  5 out of 5 strength in flexion and extension of major joints in all 4 extremities.  Coordination intact to finger-nose-finger and heel-to-shin.  Negative Romberg.  Patient appears unsteady on standing and walking requiring assistance without obvious ataxic gait.  Skin: Skin is warm and dry.  Mild dermatomal blistering rash to L flank. No bullea, erythema, drainage or signs of infection.  Psychiatric: She has a normal mood and affect.  Nursing note and vitals reviewed.    ED Treatments / Results  Labs (all labs ordered are listed, but only abnormal results are displayed) Labs Reviewed  COMPREHENSIVE METABOLIC PANEL - Abnormal; Notable for the following components:      Result Value   Chloride 99 (*)    Glucose, Bld 111 (*)    Creatinine, Ser 1.22 (*)    GFR calc non Af Amer 38 (*)    GFR calc Af Amer 44 (*)    All other components within normal limits  I-STAT CHEM 8, ED - Abnormal; Notable for the following components:   Chloride 97 (*)    BUN 21 (*)    Glucose, Bld 114 (*)    Calcium, Ion 1.12 (*)    Hemoglobin 15.6 (*)    All other components within normal limits  PROTIME-INR  APTT  CBC  DIFFERENTIAL  I-STAT  TROPONIN, ED    EKG  EKG Interpretation  Date/Time:  Friday July 26 2017 13:24:19 EST Ventricular Rate:  93 PR Interval:  136 QRS Duration: 90 QT Interval:  320 QTC Calculation: 397 R Axis:   119 Text Interpretation:  Normal sinus rhythm Right axis deviation Low voltage QRS Cannot rule out Anterior infarct , age undetermined Abnormal ECG No acute changes Confirmed by Varney Biles [phone]) on 07/26/2017 4:19:32 PM       Radiology Ct Head Wo Contrast  Result Date: 07/26/2017 CLINICAL DATA:  Nausea and dizziness EXAM: CT HEAD WITHOUT CONTRAST TECHNIQUE: Contiguous axial images were obtained from the base of the skull through the vertex without intravenous contrast. COMPARISON:  Brain MRI May 15, 2010 FINDINGS: Brain: There is stable age related volume loss. There is no intracranial mass, hemorrhage, extra-axial fluid collection, or midline shift. There is evidence of a prior infarct involving the anterior limb of the right external capsule, stable. Elsewhere there is slight periventricular small vessel disease in the centra semiovale. No acute infarct evident. Vascular: No hyperdense vessel evident. There is calcification in each carotid siphon region. Skull: The bony calvarium appears intact. Sinuses/Orbits: There is mucosal thickening in multiple ethmoid air cells. Paranasal sinuses elsewhere clear. Orbits appear symmetric bilaterally. Other: Mastoid air cells are clear. IMPRESSION: Age related volume loss with slight supratentorial small vessel disease. 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 No acute infarct. No mass or hemorrhage. There are foci of arterial vascular calcification. There is mucosal thickening in several ethmoid air cells. Electronically Signed   By: Lowella Grip III M.D.   On: 07/26/2017 14:12   Mr Brain Wo Contrast  Result Date: 07/26/2017 CLINICAL DATA:  Vertigo EXAM: MRI HEAD WITHOUT CONTRAST TECHNIQUE: Multiplanar,  multiecho pulse sequences of the brain and surrounding structures were obtained without intravenous contrast. COMPARISON:  Head CT 07/26/2017 Brain MRI 05/15/2010 FINDINGS: Brain: The midline structures are normal. There is no acute infarct or acute hemorrhage. No mass  lesion, hydrocephalus, dural abnormality or extra-axial collection. There is multifocal white matter hyperintensity suggesting chronic ischemic microangiopathy. No age-advanced or lobar predominant atrophy. No chronic microhemorrhage or superficial siderosis. Vascular: Major intracranial arterial and venous sinus flow voids are preserved. Skull and upper cervical spine: The visualized skull base, calvarium, upper cervical spine and extracranial soft tissues are normal. Sinuses/Orbits: No fluid levels or advanced mucosal thickening. No mastoid or middle ear effusion. Normal orbits. IMPRESSION: Sequelae of chronic ischemic microangiopathy without acute intracranial abnormality. No acute or chronic cerebellar infarct. Electronically Signed   By: Ulyses Jarred M.D.   On: 07/26/2017 21:00    Procedures Procedures (including critical care time)  Medications Ordered in ED Medications  meclizine (ANTIVERT) tablet 12.5 mg (12.5 mg Oral Given 07/26/17 1557)  traMADol (ULTRAM) tablet 50 mg (50 mg Oral Given 07/26/17 1703)  HYDROcodone-acetaminophen (NORCO/VICODIN) 5-325 MG per tablet 1 tablet (1 tablet Oral Given 07/26/17 2229)     Initial Impression / Assessment and Plan / ED Course  I have reviewed the triage vital signs and the nursing notes.  Pertinent labs & imaging results that were available during my care of the patient were reviewed by me and considered in my medical decision making (see chart for details).     89yoF with above hx presenting with intermittent dizziness and unsteadiness. Recently diagnosed with shingles started on gabapetin for pain on Wednesday. Afebrile and HDS. Neuro exam notable for mild unsteady gait, but no  obvious ataxia. Concern for medication side effect, peripheral vertigo, posterior cva. Ear exam normal. No inducible vertigo. CT head ordered to r/o ICH vs obvious infarct. Labs with Cr 1.2 (her baseline), otherwise unremarkable. Neg trop. Tramadol for pain on rash.  CT without acute infarct or hemorrhage but does not vascular calcifications and a stable R external capsule infarct. Will order MRI to r/o CVA.  MRI of the sequela of chronic ischemic microangiopathy with no acute cerebellar infarct or intracranial abnormality.  The patient likely having side effects from gabapentin since it was started 2 days ago her symptoms started shortly after this.  Could be exacerbated by dehydration as patient has not wanted to eat or drink recently.  Is tolerating p.o. at this time.  We will have the patient stop the gabapentin and follow-up with her PCP.  Oral rehydration at home.  Patient amenable with plan.  Final Clinical Impressions(s) / ED Diagnoses   Final diagnoses:  Dizziness    ED Discharge Orders    None       Esaul Dorwart Mali, MD 07/26/17 [id number]    Varney Biles, MD 07/28/17 1324

## 2017-07-26 NOTE — ED Notes (Signed)
Pt dx with shingles 2 weeks ago, was taking tramadol then added gabapentin --took for 2-3 days-- became dizzy yesterday, stopped taking that yesterday-- still has rash, no blisters left waist area.

## 2017-07-26 NOTE — ED Notes (Signed)
Patient updated on POC and MRI order

## 2017-07-26 NOTE — ED Notes (Signed)
Pt to MRI

## 2017-07-26 NOTE — ED Notes (Signed)
Patient c/o worsening dizziness after Antivert.   EDP made aware

## 2017-07-26 NOTE — ED Notes (Signed)
EDP at bedside  

## 2017-07-26 NOTE — ED Triage Notes (Addendum)
Pt arrives from home via [facility] c/o dizziness since yesterday, denies SOB, CP, weakness. Pt reports dizziness worse with movement.  Pt VAN in triage. Pt also reports shingles on abdomen.

## 2017-08-15 DIAGNOSIS — R42 Dizziness and giddiness: Secondary | ICD-10-CM | POA: Diagnosis not present

## 2017-08-19 ENCOUNTER — Telehealth: Payer: Self-pay | Admitting: Cardiovascular Disease

## 2017-08-19 MED ORDER — HYDROCHLOROTHIAZIDE 12.5 MG PO CAPS: 12.5000 mg | ORAL_CAPSULE | Freq: Every day | ORAL | 1 refills | Status: DC

## 2017-08-19 NOTE — Telephone Encounter (Signed)
°*  STAT* If patient is at the pharmacy, call can be transferred to refill team.   1. Which medications need to be refilled? (please list name of each medication and dose if known) Hydrochlorothiazide 12.5 mg capsules   2. Which pharmacy/location (including street and city if local pharmacy) is medication to be sent to?[place]   3. Do they need a 30 day or 90 day supply?[place]

## 2017-08-21 DIAGNOSIS — R42 Dizziness and giddiness: Secondary | ICD-10-CM | POA: Diagnosis not present

## 2017-08-22 ENCOUNTER — Telehealth: Payer: Self-pay | Admitting: Podiatry

## 2017-08-22 NOTE — Telephone Encounter (Signed)
I told pt that at this point in time she may have some discomfort of the surgery foot, but not a lot of pain, so I felt she needed to be seen. I told pt to go back into the surgery shoe and transferred to schedulers.

## 2017-08-22 NOTE — Telephone Encounter (Signed)
I had surgery on my left foot by Dr. Paulla Dolly on 11 June 2017. I'm still experiencing considerable pain and I just wondered if this was part of it or should I come in to see Dr. Paulla Dolly? Please give me a call at [phone].

## 2017-08-28 DIAGNOSIS — R42 Dizziness and giddiness: Secondary | ICD-10-CM | POA: Diagnosis not present

## 2017-08-29 ENCOUNTER — Ambulatory Visit (INDEPENDENT_AMBULATORY_CARE_PROVIDER_SITE_OTHER): Payer: Medicare HMO | Admitting: Podiatry

## 2017-08-29 ENCOUNTER — Encounter: Payer: Self-pay | Admitting: Podiatry

## 2017-08-29 ENCOUNTER — Other Ambulatory Visit: Payer: Self-pay | Admitting: Podiatry

## 2017-08-29 ENCOUNTER — Ambulatory Visit (INDEPENDENT_AMBULATORY_CARE_PROVIDER_SITE_OTHER): Payer: Medicare HMO

## 2017-08-29 DIAGNOSIS — M7750 Other enthesopathy of unspecified foot: Secondary | ICD-10-CM | POA: Diagnosis not present

## 2017-08-29 DIAGNOSIS — M79672 Pain in left foot: Secondary | ICD-10-CM

## 2017-08-29 DIAGNOSIS — M7752 Other enthesopathy of left foot: Secondary | ICD-10-CM | POA: Diagnosis not present

## 2017-08-29 DIAGNOSIS — D3610 Benign neoplasm of peripheral nerves and autonomic nervous system, unspecified: Secondary | ICD-10-CM

## 2017-08-29 DIAGNOSIS — M779 Enthesopathy, unspecified: Secondary | ICD-10-CM

## 2017-08-29 MED ORDER — TRIAMCINOLONE ACETONIDE 10 MG/ML IJ SUSP
10.0000 mg | Freq: Once | INTRAMUSCULAR | Status: AC
  Administered 2017-08-29: 10 mg

## 2017-08-29 NOTE — Progress Notes (Signed)
Subjective:   Patient ID: Jo Hendrix, female   DOB: 84 y.o.   MRN: [id number]   HPI Patient presents with discomfort that is really more around the fourth metatarsal phalangeal joint left with third interspace still doing pretty well after surgery with mild swelling noted   ROS      Objective:  Physical Exam  Neurovascular status intact with inflammation around the fourth MPJ left which appears to be new with no discomfort third interspace left     Assessment:  Inflammatory changes with problems consistent with capsulitis     Plan:  H&P condition reviewed and careful injection around the fourth MPJ administered 2 mg dexamethasone Kenalog 5 mg Xylocaine advised on padding therapy and will see if symptoms will resolve with this  X-rays did not indicate stress fracture and any advanced form of disease

## 2017-09-02 ENCOUNTER — Encounter: Payer: Self-pay | Admitting: Adult Health

## 2017-09-02 ENCOUNTER — Ambulatory Visit: Payer: Medicare HMO | Admitting: Adult Health

## 2017-09-02 ENCOUNTER — Telehealth: Payer: Self-pay | Admitting: Cardiovascular Disease

## 2017-09-02 VITALS — BP 152/74 | HR 86 | Ht 59.0 in | Wt 150.0 lb

## 2017-09-02 DIAGNOSIS — R079 Chest pain, unspecified: Secondary | ICD-10-CM | POA: Diagnosis not present

## 2017-09-02 DIAGNOSIS — R002 Palpitations: Secondary | ICD-10-CM | POA: Diagnosis not present

## 2017-09-02 DIAGNOSIS — Z79899 Other long term (current) drug therapy: Secondary | ICD-10-CM | POA: Diagnosis not present

## 2017-09-02 DIAGNOSIS — R42 Dizziness and giddiness: Secondary | ICD-10-CM | POA: Diagnosis not present

## 2017-09-02 DIAGNOSIS — R300 Dysuria: Secondary | ICD-10-CM | POA: Diagnosis not present

## 2017-09-02 NOTE — Telephone Encounter (Signed)
New message   Patient calling about dizziness for about 1 month. Patient declined to schedule next available with APP staff. Please call  STAT if patient feels like he/she is going to faint   1) Are you dizzy now? yes  2) Do you feel faint or have you passed out? no  3) Do you have any other symptoms? nausea   4) Have you checked your HR and BP (record if available)? 132/69 and 170/86

## 2017-09-02 NOTE — Progress Notes (Signed)
Cardiology Office Note   Date:  09/02/2017   ID:  Dorise, Gangi 1926/08/15, MRN [id number]  PCP:  Jonathon Jordan, MD  Cardiologist: Shelva Majestic MD   Chief Complaint  Patient presents with  . Palpitations    heart fluttering, hypertension  . Dizziness     History of Present Illness: Jo Hendrix is a 84 y.o. female who presents for ongoing assessment and management of hypertension, recurrent chest pain with most recent cardiac catheterization in October 2008 revealing mild nonobstructive coronary artery disease with 20% narrowing of the proximal LAD and mid LAD.  Patient did have systolic bridging with diffuse luminal irregularities of 43% in the RCA.  Other history includes chronic dyspnea with COPD and emphysema.  Today with complaints of dizziness, palpitations, and labile blood pressure.  According to triage note this morning, the patient has been noted that her blood pressures been fluctuating from 170/86 229/70.  Heart rates have been running in the 70s and 80s.  She has been compliant with amlodipine but not always with hydrochlorothiazide.  She was feeling that the medication was making her dizzy.  She states the symptoms started after she recovered from shingles at the end of September.. She was on antiviral medications, pain medications, along with steroids.  She has finished the course of this.  She states when she was taking some of the pain medication which included tramadol and gabapentin her dizzy spells had become worse.  She stopped taking it due to side effects.  The patient states that she is feeling the symptoms almost every day with rapid heart rhythm and some flushing.  She also complains of some dysuria stating that she feels as if she has to urinate but cannot and when she does urinate she only has a small amount.  The patient is staying hydrated does not drink a lot of caffeine.  She has a cup of coffee in the morning and drinks water the rest of the day.  Not  taking any over-the-counter antihistamines or PPIs.  Past Medical History:  Diagnosis Date  . ACE-inhibitor cough   . COPD (chronic obstructive pulmonary disease) ([place])   . Diastolic dysfunction   . History of tobacco use    smoked 25 years  . Hypertension   . Mild CAD   . Mild pulmonary hypertension ([place])   . OSA (obstructive sleep apnea)    Sleep Study 04/07/2007 - AHI during total sleep 21.30/hr, during REM 20.16/hr - BiPAP auto servo-ventilation unit    Past Surgical History:  Procedure Laterality Date  . benign lesion removed on lung  2000  . BLADDER REPAIR    . CARDIAC CATHETERIZATION  05/20/2007   mild coronary obstructive disease with 20% narrowing in prox LAD, diffuse luminal irregularity of 40-50% in mid RCA (Dr. Corky Downs)  . Cardiopulmonary Met Test  01/28/2012   with PFTs - FEV1 & FEV1/VC WNL, VC WNL, DLCO WNL; abnormal pulmonary response  . Carotid Doppler  04/2011   normal patency  . CARPAL TUNNEL RELEASE     x2  . CATARACT EXTRACTION, BILATERAL    . GALLBLADDER SURGERY    . NM MYOCAR PERF WALL MOTION  09/2011   lexiscan myoview - normal perfusion, EF 77%, low risk  . Renal Doppler  05/2007   normal renal arteries   . TONSILLECTOMY    . TRANSTHORACIC ECHOCARDIOGRAM  2013   EF 50-55%; mild MR; mild TR; mild pulm valve regurg; aortic root sclerosis/calcification  . VAGINAL  HYSTERECTOMY    . VARICOSE VEIN SURGERY       Current Outpatient Medications  Medication Sig Dispense Refill  . amLODipine (NORVASC) 5 MG tablet TAKE 1 TABLET EVERY DAY 90 tablet 1  . aspirin 81 MG tablet Take 81 mg by mouth daily.    . Calcium 1500 MG tablet Take 1,500 mg by mouth daily.    . cholecalciferol (VITAMIN D) 1000 UNITS tablet Take 1 tablet by mouth daily. Take 1 tab daily    . NON FORMULARY at bedtime. BiPAP    . Omega-3 Fatty Acids (FISH OIL) 1000 MG CAPS Take 1 capsule by mouth 3 (three) times daily.    . RESTASIS 0.05 % ophthalmic emulsion Place 1 drop into both eyes 2 (two)  times daily. Use 1 drop in each eye twice a day    . rosuvastatin (CRESTOR) 5 MG tablet TAKE 1 TABLET EVERY OTHER DAY 45 tablet 1   No current facility-administered medications for this visit.    Facility-Administered Medications Ordered in Other Visits  Medication Dose Route Frequency Provider Last Rate Last Dose  . aminophylline injection 75 mg  75 mg Intravenous TID PRN Larey Dresser, MD   75 mg at 04/07/15 1105    Allergies:   Zetia [ezetimibe]; Ace inhibitors; Codeine; Erythromycin; and Ivp dye [iodinated diagnostic agents]    Social History:  The patient  reports that she quit smoking about 33 years ago. Her smoking use included cigarettes. She has a 12.50 pack-year smoking history. she has never used smokeless tobacco. She reports that she does not drink alcohol or use drugs.   Family History:  The patient's family history includes Breast cancer in her daughter; Cancer in her sister; Lung cancer in her mother; Stroke in her sister; Suicidality in her father.    ROS: All other systems are reviewed and negative. Unless otherwise mentioned in H&P    PHYSICAL EXAM: VS:  BP (!) 152/74   Pulse 86   Ht '4\' 11"'  (7.616 m)   Wt 150 lb (68 kg)   BMI 30.30 kg/m  , BMI Body mass index is 30.3 kg/m. GEN: Well nourished, well developed, in no acute distress  HEENT: normal  Neck: no JVD, carotid bruits, or masses Cardiac: RRR; no murmurs, rubs, or gallops,no edema  Respiratory:  clear to auscultation bilaterally, normal work of breathing GI: soft, nontender, nondistended, + BS MS: no deformity or atrophy  Skin: warm and dry, no rash Neuro:  Strength and sensation are intact Psych: euthymic mood, full affect   EKG: Normal sinus rhythm, heart rate 86 bpm, T wave flattening inferior laterally.  Recent Labs: 05/03/2017: NT-Pro BNP 224; TSH 0.880 07/26/2017: ALT 17; BUN 21; Creatinine, Ser 1.00; Hemoglobin 15.6; Platelets 302; Potassium 3.5; Sodium 137    Lipid Panel    Component  Value Date/Time   CHOL 214 (H) 05/03/2017 1229   TRIG 97 05/03/2017 1229   HDL 63 05/03/2017 1229   CHOLHDL 3.4 05/03/2017 1229   CHOLHDL 2.9 01/16/2017 0817   VLDL 31 (H) 01/16/2017 0817   LDLCALC 132 (H) 05/03/2017 1229      Wt Readings from Last 3 Encounters:  09/02/17 150 lb (68 kg)  07/26/17 150 lb (68 kg)  06/13/17 155 lb 3.2 oz (70.4 kg)      Other studies Reviewed:  Echocardiogram 05/17/2017 Left ventricle: The cavity size was normal. Systolic function was   normal. The estimated ejection fraction was in the range of 60%   to  65%. Wall motion was normal; there were no regional wall   motion abnormalities. Doppler parameters are consistent with   abnormal left ventricular relaxation (grade 1 diastolic   dysfunction). Doppler parameters are consistent with   indeterminate ventricular filling pressure. - Aortic valve: Transvalvular velocity was within the normal range.   There was no stenosis. There was moderate regurgitation.   Regurgitation pressure half-time: 291 ms. - Mitral valve: Mildly calcified annulus. Transvalvular velocity   was within the normal range. There was no evidence for stenosis.   There was trivial regurgitation. - Right ventricle: The cavity size was normal. Wall thickness was   normal. Systolic function was normal. - Atrial septum: No defect or patent foramen ovale was identified. - Tricuspid valve: There was mild regurgitation. - Pulmonic valve: There was moderate regurgitation. - Pulmonary arteries: Systolic pressure was within the normal   range. PA peak pressure: 33 mm Hg (S).   ASSESSMENT AND PLAN:  1.  Dizziness: The patient denies any near-syncope or syncope.  She states she feels it while she is sitting or standing.  She usually associated with palpitations and flushing.  The patient has symptoms of vertigo.  She had been tried on Antivert but this made her dizziness worse.  Due to complaints of palpitation associated with dizziness,  I am going to put a one-week cardiac monitor on the patient.  This will help to ascertain if she is having any arrhythmias causing her symptoms.  She states she feels them almost every day so I do not feel a need to keep her on her monitor any longer than a week.  In the interim I am going to repeat her BMET and check a magnesium.  2.  Dysuria: Check a UA to evaluate for UTI which can also sometimes cause dizziness along with her symptoms of urinary frequency and inability to fully empty her bladder.  3.  Hypertension: The patient's blood pressures been quite labile.  She takes amlodipine in the morning and by lunchtime her blood pressures fairly low, but prior to that blood pressures are elevated in the 150s and 60s.  Patient did have orthostatic blood pressures completed during this office visit and she was found to be negative.  She does not like taking HCTZ as she states it makes her dizziness worse.  I will stop the HCTZ for now she is intolerant of it.  I will continue her on amlodipine for now.  If no further etiology is found for dizziness based upon the testing I am doing today, may need to consider decreasing or changing amlodipine to different antihypertensive. Current medicines are reviewed at length with the patient today.    Labs/ tests ordered today include: BMET, magnesium, and urinalysis.  Phill Myron. West Pugh, ANP, AACC   09/02/2017 3:07 PM    [facility] [address] [place], [place] [address] Phone: [phone]; Fax: [phone]

## 2017-09-02 NOTE — Patient Instructions (Signed)
Medication Instructions:  Stop hydrochlorothiazide   If you need a refill on your cardiac medications before your next appointment, please call your pharmacy.  Labwork: UA, BMET AND MAG TODAY HERE IN OUR OFFICE AT [facility]  Testing/Procedures: Your physician has recommended that you wear an event monitor-7 DAY EVENT MONITOR. Event monitors are medical devices that record the heart's electrical activity. Doctors most often [place] these monitors to diagnose arrhythmias. Arrhythmias are problems with the speed or rhythm of the heartbeat. The monitor is a small, portable device. You can wear one while you do your normal daily activities. This is usually used to diagnose what is causing palpitations/syncope (passing out).  Follow-Up: Your physician wants you to follow-up in: [place], DNP.    Thank you for choosing [facility] at [facility]!!

## 2017-09-02 NOTE — Telephone Encounter (Signed)
Returned the call to the patient. She stated that for the last month she has had fluttering in her heart. Her blood pressure has been fluctuating from 170/86 last night and 129/70 this morning. Her heart rates have been running from 70-80's. She takes her amlodipine 5 mg but is not consistent with her Hyrdochlorothiazide. She stated that she feels like the medication could be making her feel dizzy and nauseated. An appointment has been made for today at 2:30 with Leonia Reader, DNP.

## 2017-09-03 ENCOUNTER — Telehealth: Payer: Self-pay | Admitting: Cardiovascular Disease

## 2017-09-03 LAB — URINALYSIS
Bilirubin, UA: NEGATIVE
Glucose, UA: NEGATIVE
KETONES UA: NEGATIVE
NITRITE UA: NEGATIVE
PH UA: 5.5 (ref 5.0–7.5)
RBC UA: NEGATIVE
SPEC GRAV UA: 1.021 (ref 1.005–1.030)
UUROB: 0.2 mg/dL (ref 0.2–1.0)

## 2017-09-03 LAB — BASIC METABOLIC PANEL
BUN/Creatinine Ratio: 16 (ref 12–28)
BUN: 22 mg/dL (ref 8–27)
CALCIUM: 9.2 mg/dL (ref 8.7–10.3)
CHLORIDE: 96 mmol/L (ref 96–106)
CO2: 23 mmol/L (ref 20–29)
Creatinine, Ser: 1.4 mg/dL — ABNORMAL HIGH (ref 0.57–1.00)
GFR calc Af Amer: 39 mL/min/{1.73_m2} — ABNORMAL LOW (ref >59)
GFR calc non Af Amer: 34 mL/min/{1.73_m2} — ABNORMAL LOW (ref >59)
GLUCOSE: 96 mg/dL (ref 65–99)
POTASSIUM: 4.2 mmol/L (ref 3.5–5.2)
Sodium: 138 mmol/L (ref 134–144)

## 2017-09-03 LAB — MAGNESIUM: Magnesium: 2 mg/dL (ref 1.6–2.3)

## 2017-09-03 NOTE — Telephone Encounter (Signed)
New message ° °Pt verbalized that she is returning call for RN °

## 2017-09-03 NOTE — Telephone Encounter (Signed)
Patient would like to hold off on scheduling at this time.   [place] with current equipment and does not want to pay for study and/or new machine at this time.

## 2017-09-03 NOTE — Telephone Encounter (Signed)
Returned call to patient-patient aware of results and recommendations.  Aware results sent to PCP.   Patient will call PCP to follow up.

## 2017-09-06 DIAGNOSIS — N183 Chronic kidney disease, stage 3 (moderate): Secondary | ICD-10-CM | POA: Diagnosis not present

## 2017-09-09 ENCOUNTER — Ambulatory Visit (INDEPENDENT_AMBULATORY_CARE_PROVIDER_SITE_OTHER): Payer: Medicare HMO

## 2017-09-09 DIAGNOSIS — R42 Dizziness and giddiness: Secondary | ICD-10-CM

## 2017-09-09 DIAGNOSIS — R079 Chest pain, unspecified: Secondary | ICD-10-CM

## 2017-09-09 DIAGNOSIS — R002 Palpitations: Secondary | ICD-10-CM

## 2017-09-16 ENCOUNTER — Telehealth: Payer: Self-pay | Admitting: Cardiovascular Disease

## 2017-09-16 NOTE — Telephone Encounter (Signed)
Call from [facility] stating the patient has triggered the button for her monitor. They had an alert that the patient had passed out. While speaking with Mitzi Hansen from [employer], the patient had called them back to state that she was fine and did not pass out. EKG read NSR with heart rate 86.  Call placed to the patient to verify that she was indeed okay. She stated that she was fine and did not know how the button got pressed.

## 2017-09-23 ENCOUNTER — Encounter: Payer: Self-pay | Admitting: Adult Health

## 2017-09-23 ENCOUNTER — Ambulatory Visit: Payer: Medicare HMO | Admitting: Adult Health

## 2017-09-23 VITALS — BP 134/68 | HR 83 | Ht 59.0 in | Wt 150.0 lb

## 2017-09-23 DIAGNOSIS — R42 Dizziness and giddiness: Secondary | ICD-10-CM | POA: Diagnosis not present

## 2017-09-23 DIAGNOSIS — I10 Essential (primary) hypertension: Secondary | ICD-10-CM

## 2017-09-23 DIAGNOSIS — R002 Palpitations: Secondary | ICD-10-CM | POA: Diagnosis not present

## 2017-09-23 NOTE — Patient Instructions (Signed)
Medication Instructions:  NO CHANGES-Your physician recommends that you continue on your current medications as directed. Please refer to the Current Medication list given to you today.  If you need a refill on your cardiac medications before your next appointment, please call your pharmacy.  Special Instructions: CALL WITH ANY QUESTIONS  Follow-Up: Your physician wants you to follow-up in: [place] should receive a reminder letter in the mail two months in advance. If you do not receive a letter, please call our office 12-2017 to schedule the 03-2018 follow-up appointment.   Thank you for choosing [facility] at [facility]!!

## 2017-09-23 NOTE — Progress Notes (Signed)
Cardiology Office Note   Date:  09/23/2017   ID:  Jo Hendrix, DOB 01-25-1927, MRN [id number]  PCP:  Jonathon Jordan, MD  Cardiologist: Dr. Shelva Majestic No chief complaint on file.    History of Present Illness: Jo Hendrix is a 84 y.o. female who presents for ongoing assessment and management of hypertension, history of coronary artery disease with most recent cardiac catheterization 05/2007 revealing mild nonobstructive CAD with a 20% narrowing in the proximal LAD and in the mid LAD.  There was also found to be systolic bridging with diffuse luminal irregularities and 43% stenosis in the RCA.  Other history includes COPD, emphysema, and chronic dyspnea on exertion.  She was last seen in the office on 09/02/2017 with complaints of palpitations dizziness and labile blood pressure.  She was compliant with amlodipine but not always with HCTZ.  She stopped taking it as she thought that that medication was making her feel dizzy.  She was also recovering from shingles which occurred at the end of September 2018.  She also had trouble with dysuria.  Due to her symptoms of frequent dizziness and palpitations, I placed a cardiac monitor on the patient to help to ascertain if she is having any arrhythmias causing her symptoms, or bradycardia.   We also checked a UA due to dysuria.  We had orthostatic blood pressures checked during the recent office visit due to her labile blood pressure and those were found to be negative.    HCTZ was discontinued as she was intolerant of it.  She is also here to follow-up on blood pressure control with consideration of need to add another medication or titrate current medication regimen.  UA was found to be negative for UTI.  She comes today feeling much better, no further dizziness, no further palpitations, no chest discomfort. Cardiac monitor was read today by Dr. Shelva Majestic.  Study Highlights   The patient was monitored from 09/09/2017 until 09/15/2017.   Her predominant rhythm was sinus rhythm.  Throughout the protocol.  She had isolated unifocal PVCs.  No high-grade ectopy was detected.  There was no evidence for bigeminy or trigeminy or quadrigeminal he.  There were no episodes of VT.  The 2 highest frequencies were 9 PVCs in 1 minute and 16 PVCs in 2 minutes.  There were no episodes of atrial fibrillation.  There were no positive.    Past Medical History:  Diagnosis Date  . ACE-inhibitor cough   . COPD (chronic obstructive pulmonary disease) ([place])   . Diastolic dysfunction   . History of tobacco use    smoked 25 years  . Hypertension   . Mild CAD   . Mild pulmonary hypertension ([place])   . OSA (obstructive sleep apnea)    Sleep Study 04/07/2007 - AHI during total sleep 21.30/hr, during REM 20.16/hr - BiPAP auto servo-ventilation unit    Past Surgical History:  Procedure Laterality Date  . benign lesion removed on lung  2000  . BLADDER REPAIR    . CARDIAC CATHETERIZATION  05/20/2007   mild coronary obstructive disease with 20% narrowing in prox LAD, diffuse luminal irregularity of 40-50% in mid RCA (Dr. Corky Downs)  . Cardiopulmonary Met Test  01/28/2012   with PFTs - FEV1 & FEV1/VC WNL, VC WNL, DLCO WNL; abnormal pulmonary response  . Carotid Doppler  04/2011   normal patency  . CARPAL TUNNEL RELEASE     x2  . CATARACT EXTRACTION, BILATERAL    . GALLBLADDER SURGERY    .  NM MYOCAR PERF WALL MOTION  09/2011   lexiscan myoview - normal perfusion, EF 77%, low risk  . Renal Doppler  05/2007   normal renal arteries   . TONSILLECTOMY    . TRANSTHORACIC ECHOCARDIOGRAM  2013   EF 50-55%; mild MR; mild TR; mild pulm valve regurg; aortic root sclerosis/calcification  . VAGINAL HYSTERECTOMY    . VARICOSE VEIN SURGERY       Current Outpatient Medications  Medication Sig Dispense Refill  . amLODipine (NORVASC) 5 MG tablet TAKE 1 TABLET EVERY DAY 90 tablet 1  . aspirin 81 MG tablet Take 81 mg by mouth daily.    . Calcium 1500 MG tablet Take  1,500 mg by mouth daily.    . cholecalciferol (VITAMIN D) 1000 UNITS tablet Take 1 tablet by mouth daily. Take 1 tab daily    . NON FORMULARY at bedtime. BiPAP    . Omega-3 Fatty Acids (FISH OIL) 1000 MG CAPS Take 1 capsule by mouth 3 (three) times daily.    . RESTASIS 0.05 % ophthalmic emulsion Place 1 drop into both eyes 2 (two) times daily. Use 1 drop in each eye twice a day    . rosuvastatin (CRESTOR) 5 MG tablet TAKE 1 TABLET EVERY OTHER DAY 45 tablet 1   No current facility-administered medications for this visit.    Facility-Administered Medications Ordered in Other Visits  Medication Dose Route Frequency Provider Last Rate Last Dose  . aminophylline injection 75 mg  75 mg Intravenous TID PRN Larey Dresser, MD   75 mg at 04/07/15 1105    Allergies:   Zetia [ezetimibe]; Ace inhibitors; Codeine; Erythromycin; and Ivp dye [iodinated diagnostic agents]    Social History:  The patient  reports that she quit smoking about 33 years ago. Her smoking use included cigarettes. She has a 12.50 pack-year smoking history. she has never used smokeless tobacco. She reports that she does not drink alcohol or use drugs.   Family History:  The patient's family history includes Breast cancer in her daughter; Cancer in her sister; Lung cancer in her mother; Stroke in her sister; Suicidality in her father.    ROS: All other systems are reviewed and negative. Unless otherwise mentioned in H&P    PHYSICAL EXAM: VS:  There were no vitals taken for this visit. , BMI There is no height or weight on file to calculate BMI. GEN: Well nourished, well developed, in no acute distress  HEENT: normal  Neck: no JVD, carotid bruits, or masses Cardiac: RRR; no murmurs, rubs, or gallops,no edema  Respiratory:  clear to auscultation bilaterally, normal work of breathing GI: soft, nontender, nondistended, + BS MS: no deformity or atrophy  Skin: warm and dry, no rash Neuro:  Strength and sensation are  intact Psych: euthymic mood, full affect   Recent Labs: 05/03/2017: NT-Pro BNP 224; TSH 0.880 07/26/2017: ALT 17; Hemoglobin 15.6; Platelets 302 09/02/2017: BUN 22; Creatinine, Ser 1.40; Magnesium 2.0; Potassium 4.2; Sodium 138    Lipid Panel    Component Value Date/Time   CHOL 214 (H) 05/03/2017 1229   TRIG 97 05/03/2017 1229   HDL 63 05/03/2017 1229   CHOLHDL 3.4 05/03/2017 1229   CHOLHDL 2.9 01/16/2017 0817   VLDL 31 (H) 01/16/2017 0817   LDLCALC 132 (H) 05/03/2017 1229      Wt Readings from Last 3 Encounters:  09/02/17 150 lb (68 kg)  07/26/17 150 lb (68 kg)  06/13/17 155 lb 3.2 oz (70.4 kg)  ASSESSMENT AND PLAN:  1. Dizziness: Much better after discontinuation of HCTZ. Blood pressure is stable. She feels that her, is back to driving, and ADLs without any further complaints.  2. Palpitations: Ellyn Hack monitor did show some PVCs highest amount or 16 PVCs and 2 minutes. But there were no episodes of atrial fibrillation ectopy or VT. potassium and magnesium were within normal limits.  3. Hypertension: Blood pressure is much better controlled. No further changes or recommendations at this time.  4. Dyspnea: The patient has some dyspnea when she eats a full meal, when she then over. Advised heat smaller meals and food that her more easily digestible, and avoid gassy foods. She does not feel dyspnea when she is active.  Current medicines are reviewed at length with the patient today.    Labs/ tests ordered today include: None  Phill Myron. West Pugh, ANP, AACC   09/23/2017 7:39 AM    [facility] [address], [place], [place] [address] Phone: [phone]; Fax: [phone]

## 2017-09-25 ENCOUNTER — Ambulatory Visit: Payer: Medicare HMO | Admitting: Neurology

## 2017-10-01 ENCOUNTER — Other Ambulatory Visit: Payer: Self-pay | Admitting: Adult Health

## 2017-10-03 DIAGNOSIS — G4733 Obstructive sleep apnea (adult) (pediatric): Secondary | ICD-10-CM | POA: Diagnosis not present

## 2017-10-04 ENCOUNTER — Encounter: Payer: Self-pay | Admitting: Podiatry

## 2017-10-04 ENCOUNTER — Ambulatory Visit: Payer: Medicare HMO | Admitting: Podiatry

## 2017-10-04 DIAGNOSIS — I10 Essential (primary) hypertension: Secondary | ICD-10-CM | POA: Diagnosis not present

## 2017-10-04 DIAGNOSIS — N183 Chronic kidney disease, stage 3 (moderate): Secondary | ICD-10-CM | POA: Diagnosis not present

## 2017-10-04 DIAGNOSIS — G2581 Restless legs syndrome: Secondary | ICD-10-CM | POA: Diagnosis not present

## 2017-10-04 DIAGNOSIS — I700 Atherosclerosis of aorta: Secondary | ICD-10-CM | POA: Diagnosis not present

## 2017-10-04 DIAGNOSIS — I519 Heart disease, unspecified: Secondary | ICD-10-CM | POA: Diagnosis not present

## 2017-10-04 DIAGNOSIS — Z79899 Other long term (current) drug therapy: Secondary | ICD-10-CM | POA: Diagnosis not present

## 2017-10-04 DIAGNOSIS — M779 Enthesopathy, unspecified: Secondary | ICD-10-CM | POA: Diagnosis not present

## 2017-10-04 DIAGNOSIS — Z0000 Encounter for general adult medical examination without abnormal findings: Secondary | ICD-10-CM | POA: Diagnosis not present

## 2017-10-04 MED ORDER — TRIAMCINOLONE ACETONIDE 10 MG/ML IJ SUSP
10.0000 mg | Freq: Once | INTRAMUSCULAR | Status: AC
  Administered 2017-10-04: 10 mg

## 2017-10-06 NOTE — Progress Notes (Signed)
Subjective:   Patient ID: Jo Hendrix, female   DOB: 84 y.o.   MRN: [id number]   HPI Patient presents stating overall doing well but I am still getting pain and it seems like it moves around   ROS      Objective:  Physical Exam  Neurovascular status intact with inflammation of the third interspace which seems to be doing very well with quite a bit of discomfort in the second intermetatarsal space with fluid buildup noted     Assessment:  Possibility for inflammatory changes around the second MPJ and second interspace with the third interspace where surgery was done which seems to be doing well     Plan:  X-rays reviewed and today careful injection administered second interspace 3 mg dexamethasone Kenalog 5 mg Xylocaine advised on supportive type shoe gear and will reappoint if symptoms persist  X-rays indicate no signs of stress fracture or arthritis

## 2017-10-14 DIAGNOSIS — H04123 Dry eye syndrome of bilateral lacrimal glands: Secondary | ICD-10-CM | POA: Diagnosis not present

## 2017-11-25 DIAGNOSIS — R42 Dizziness and giddiness: Secondary | ICD-10-CM | POA: Diagnosis not present

## 2017-12-18 DIAGNOSIS — N183 Chronic kidney disease, stage 3 (moderate): Secondary | ICD-10-CM | POA: Diagnosis not present

## 2017-12-18 DIAGNOSIS — I129 Hypertensive chronic kidney disease with stage 1 through stage 4 chronic kidney disease, or unspecified chronic kidney disease: Secondary | ICD-10-CM | POA: Diagnosis not present

## 2017-12-20 ENCOUNTER — Other Ambulatory Visit: Payer: Self-pay | Admitting: Nephrology

## 2017-12-20 DIAGNOSIS — N183 Chronic kidney disease, stage 3 unspecified: Secondary | ICD-10-CM

## 2017-12-20 DIAGNOSIS — I129 Hypertensive chronic kidney disease with stage 1 through stage 4 chronic kidney disease, or unspecified chronic kidney disease: Secondary | ICD-10-CM

## 2018-01-01 ENCOUNTER — Ambulatory Visit
Admission: RE | Admit: 2018-01-01 | Discharge: 2018-01-01 | Disposition: A | Discharge status: Home / Self Care | Payer: Medicare HMO | Source: Ambulatory Visit | Attending: Nephrology | Admitting: Nephrology

## 2018-01-01 DIAGNOSIS — N183 Chronic kidney disease, stage 3 unspecified: Secondary | ICD-10-CM

## 2018-01-01 DIAGNOSIS — N281 Cyst of kidney, acquired: Secondary | ICD-10-CM | POA: Diagnosis not present

## 2018-01-01 DIAGNOSIS — I129 Hypertensive chronic kidney disease with stage 1 through stage 4 chronic kidney disease, or unspecified chronic kidney disease: Secondary | ICD-10-CM

## 2018-01-08 ENCOUNTER — Ambulatory Visit: Payer: Medicare HMO | Admitting: Podiatry

## 2018-01-16 ENCOUNTER — Encounter: Payer: Self-pay | Admitting: Podiatry

## 2018-01-16 ENCOUNTER — Ambulatory Visit: Payer: Medicare HMO | Admitting: Podiatry

## 2018-01-16 DIAGNOSIS — G629 Polyneuropathy, unspecified: Secondary | ICD-10-CM

## 2018-01-16 DIAGNOSIS — M779 Enthesopathy, unspecified: Secondary | ICD-10-CM

## 2018-01-16 MED ORDER — TRIAMCINOLONE ACETONIDE 10 MG/ML IJ SUSP
10.0000 mg | Freq: Once | INTRAMUSCULAR | Status: AC
  Administered 2018-01-16: 10 mg

## 2018-01-19 NOTE — Progress Notes (Signed)
Subjective:   Patient ID: Jo Hendrix, female   DOB: 84 y.o.   MRN: [id number]   HPI Patient presents stating she is having pain is different than before surgery but it burns throughout the day and seems to be on the outside of the foot.  The incision site itself from previous surgery is healed very well   ROS      Objective:  Physical Exam  Neurovascular status intact with patient noted to have inflammation of the lateral side of the left foot with what appears to be some form of nerve irritation or other neuropathic pathology     Assessment:  Possibility for neuropathy or other nerve condition versus tendinitis inflammatory condition left lateral foot     Plan:  I did do careful steroidal injection to try to reduce any inflammatory component to condition and I am referring to physical therapy for possible neurogenic's treatment and also to reduce the inflammatory complex present.  Patient will be seen back if symptoms persist and may need neurological consult

## 2018-01-20 DIAGNOSIS — G629 Polyneuropathy, unspecified: Secondary | ICD-10-CM | POA: Diagnosis not present

## 2018-01-20 DIAGNOSIS — M25672 Stiffness of left ankle, not elsewhere classified: Secondary | ICD-10-CM | POA: Diagnosis not present

## 2018-01-20 DIAGNOSIS — M25572 Pain in left ankle and joints of left foot: Secondary | ICD-10-CM | POA: Diagnosis not present

## 2018-01-22 NOTE — Addendum Note (Signed)
Addended by: Harriett Sine D on: 01/22/2018 05:39 PM   Modules accepted: Orders

## 2018-01-24 DIAGNOSIS — G629 Polyneuropathy, unspecified: Secondary | ICD-10-CM | POA: Diagnosis not present

## 2018-01-24 DIAGNOSIS — M25672 Stiffness of left ankle, not elsewhere classified: Secondary | ICD-10-CM | POA: Diagnosis not present

## 2018-01-24 DIAGNOSIS — M25572 Pain in left ankle and joints of left foot: Secondary | ICD-10-CM | POA: Diagnosis not present

## 2018-01-27 DIAGNOSIS — M25572 Pain in left ankle and joints of left foot: Secondary | ICD-10-CM | POA: Diagnosis not present

## 2018-01-27 DIAGNOSIS — M25672 Stiffness of left ankle, not elsewhere classified: Secondary | ICD-10-CM | POA: Diagnosis not present

## 2018-01-27 DIAGNOSIS — G629 Polyneuropathy, unspecified: Secondary | ICD-10-CM | POA: Diagnosis not present

## 2018-01-31 DIAGNOSIS — M25672 Stiffness of left ankle, not elsewhere classified: Secondary | ICD-10-CM | POA: Diagnosis not present

## 2018-01-31 DIAGNOSIS — M25572 Pain in left ankle and joints of left foot: Secondary | ICD-10-CM | POA: Diagnosis not present

## 2018-01-31 DIAGNOSIS — G629 Polyneuropathy, unspecified: Secondary | ICD-10-CM | POA: Diagnosis not present

## 2018-02-03 DIAGNOSIS — M25572 Pain in left ankle and joints of left foot: Secondary | ICD-10-CM | POA: Diagnosis not present

## 2018-02-03 DIAGNOSIS — G629 Polyneuropathy, unspecified: Secondary | ICD-10-CM | POA: Diagnosis not present

## 2018-02-03 DIAGNOSIS — M25672 Stiffness of left ankle, not elsewhere classified: Secondary | ICD-10-CM | POA: Diagnosis not present

## 2018-02-05 ENCOUNTER — Other Ambulatory Visit: Payer: Self-pay | Admitting: Cardiovascular Disease

## 2018-03-31 DIAGNOSIS — R42 Dizziness and giddiness: Secondary | ICD-10-CM | POA: Diagnosis not present

## 2018-04-17 DIAGNOSIS — H6121 Impacted cerumen, right ear: Secondary | ICD-10-CM | POA: Diagnosis not present

## 2018-04-18 DIAGNOSIS — I129 Hypertensive chronic kidney disease with stage 1 through stage 4 chronic kidney disease, or unspecified chronic kidney disease: Secondary | ICD-10-CM | POA: Diagnosis not present

## 2018-04-18 DIAGNOSIS — N183 Chronic kidney disease, stage 3 (moderate): Secondary | ICD-10-CM | POA: Diagnosis not present

## 2018-04-24 DIAGNOSIS — M1712 Unilateral primary osteoarthritis, left knee: Secondary | ICD-10-CM | POA: Diagnosis not present

## 2018-04-24 DIAGNOSIS — M25562 Pain in left knee: Secondary | ICD-10-CM | POA: Diagnosis not present

## 2018-05-01 ENCOUNTER — Other Ambulatory Visit: Payer: Self-pay | Admitting: Cardiovascular Disease

## 2018-05-12 DIAGNOSIS — G4733 Obstructive sleep apnea (adult) (pediatric): Secondary | ICD-10-CM | POA: Diagnosis not present

## 2018-05-19 ENCOUNTER — Ambulatory Visit (INDEPENDENT_AMBULATORY_CARE_PROVIDER_SITE_OTHER): Payer: Medicare HMO | Admitting: Podiatry

## 2018-05-19 ENCOUNTER — Encounter: Payer: Self-pay | Admitting: Podiatry

## 2018-05-19 DIAGNOSIS — M779 Enthesopathy, unspecified: Secondary | ICD-10-CM

## 2018-05-19 DIAGNOSIS — R42 Dizziness and giddiness: Secondary | ICD-10-CM | POA: Insufficient documentation

## 2018-05-19 DIAGNOSIS — D3610 Benign neoplasm of peripheral nerves and autonomic nervous system, unspecified: Secondary | ICD-10-CM | POA: Diagnosis not present

## 2018-05-19 DIAGNOSIS — G629 Polyneuropathy, unspecified: Secondary | ICD-10-CM

## 2018-05-19 MED ORDER — PREGABALIN 25 MG PO CAPS: 25.0000 mg | ORAL_CAPSULE | Freq: Two times a day (BID) | ORAL | 0 refills | Status: DC

## 2018-05-19 NOTE — Progress Notes (Signed)
Subjective:   Patient ID: Jo Hendrix, female   DOB: 84 y.o.   MRN: [id number]   HPI Patient states she still having some pain in her left foot and states that the injection only helped temporarily and the pain seems to come and go and it shooting and is not related that much to weightbearing   ROS      Objective:  Physical Exam  Neurovascular status intact with patient's still having some discomfort in the left forefoot around the lesser MPJs and also the interspace of the second and third.  There is no specific orientation to the pain     Assessment:  Hard to tell whether this is any kind of nerve compression versus an inflammatory condition left     Plan:  Reviewed condition and recommended that we continue with anti-inflammatory and will get a start on low-dose Lyrica see if she tolerates this and that she can take this this may be of help but she is had trouble with meds in the past so we will have her start one at nighttime and if it makes her sleepy or causes any effect she will stop taking it and we will start her have a low dose of 25 mg

## 2018-05-26 DIAGNOSIS — M1712 Unilateral primary osteoarthritis, left knee: Secondary | ICD-10-CM | POA: Diagnosis not present

## 2018-05-26 DIAGNOSIS — M25562 Pain in left knee: Secondary | ICD-10-CM | POA: Diagnosis not present

## 2018-05-28 ENCOUNTER — Telehealth: Payer: Self-pay | Admitting: Podiatry

## 2018-05-28 NOTE — Telephone Encounter (Signed)
Dr. Paulla Dolly prescribed Lyrica for my foot pain however its a little expensive. He mentioned gabapentin and I was wondering if he could prescribe that for me to try.

## 2018-05-29 MED ORDER — GABAPENTIN 300 MG PO CAPS: 300.0000 mg | ORAL_CAPSULE | Freq: Three times a day (TID) | ORAL | 3 refills | Status: DC

## 2018-05-29 NOTE — Addendum Note (Signed)
Addended by: Celene Skeen A on: 05/29/2018 04:49 PM   Modules accepted: Orders

## 2018-05-29 NOTE — Addendum Note (Signed)
Addended by: Celene Skeen A on: 05/29/2018 10:06 AM   Modules accepted: Orders

## 2018-05-29 NOTE — Telephone Encounter (Signed)
Sent a prescription for Gabapentin 300 mg, three times a day; Dispensed 90 with 3 refills. Prescription was sent to patient's pharmacy at [facility] on [facility] [address] in [place], [place] at 4:45 pm.

## 2018-05-30 DIAGNOSIS — Z01419 Encounter for gynecological examination (general) (routine) without abnormal findings: Secondary | ICD-10-CM | POA: Diagnosis not present

## 2018-05-30 DIAGNOSIS — Z6832 Body mass index (BMI) 32.0-32.9, adult: Secondary | ICD-10-CM | POA: Diagnosis not present

## 2018-05-30 DIAGNOSIS — Z1231 Encounter for screening mammogram for malignant neoplasm of breast: Secondary | ICD-10-CM | POA: Diagnosis not present

## 2018-06-24 DIAGNOSIS — M25562 Pain in left knee: Secondary | ICD-10-CM | POA: Diagnosis not present

## 2018-06-24 DIAGNOSIS — M1712 Unilateral primary osteoarthritis, left knee: Secondary | ICD-10-CM | POA: Diagnosis not present

## 2018-07-03 ENCOUNTER — Ambulatory Visit: Payer: Medicare HMO | Admitting: Cardiovascular Disease

## 2018-07-03 ENCOUNTER — Encounter: Payer: Self-pay | Admitting: Cardiovascular Disease

## 2018-07-03 VITALS — BP 126/78 | HR 72 | Ht 59.5 in | Wt 154.2 lb

## 2018-07-03 DIAGNOSIS — E785 Hyperlipidemia, unspecified: Secondary | ICD-10-CM | POA: Diagnosis not present

## 2018-07-03 DIAGNOSIS — G4731 Primary central sleep apnea: Secondary | ICD-10-CM

## 2018-07-03 DIAGNOSIS — R002 Palpitations: Secondary | ICD-10-CM

## 2018-07-03 DIAGNOSIS — I2510 Atherosclerotic heart disease of native coronary artery without angina pectoris: Secondary | ICD-10-CM | POA: Diagnosis not present

## 2018-07-03 DIAGNOSIS — I10 Essential (primary) hypertension: Secondary | ICD-10-CM | POA: Diagnosis not present

## 2018-07-03 DIAGNOSIS — I519 Heart disease, unspecified: Secondary | ICD-10-CM | POA: Diagnosis not present

## 2018-07-03 DIAGNOSIS — I5189 Other ill-defined heart diseases: Secondary | ICD-10-CM

## 2018-07-03 NOTE — Progress Notes (Signed)
Patient ID: MAUD RUBENDALL, female   DOB: 04/26/1927, 84 y.o.   MRN: [id number]    PCP: Dr. Jonathon Jordan  HPI: AVRYL ROEHM is a 84 y.o. female who presents to the office for a 12 month cardiology evaluation.  Ms. Thoma has a long-standing history of hypertension with documented grade 1 diastolic dysfunction.  She also has a history of complex sleep apnea with documented mild pulmonary hypertension and has been on BiPAP. In October 2008 she underwent cardiac catheterization after developing recurrent episodes of chest pain.  This showed mild coronary obstructive disease with 20% narrowing of the proximal LAD with mid LAD systolic bridging and diffuse luminal irregularities of 40-50% in the RCA.  She has had difficulty with shortness of breath and has been diagnosed with COPD/emphysema.  A cardiopulmonary met test  revealed normal functional status with peak oxygen consumption 105%, but she had an abnormal pulmonary response with no ventilation perfusion mismatch and with plus/minus dead space.  She has been intolerant to statin therapy as well as Zetia.  She continues to use BiPAP for her complex sleep apnea.  She had noticed progressive increasing shortness of breath with activity and had experienced some very mild chest tightness when she is short of breath.  She also admitted to some mild lower extremity edema particularly involving the left leg by the end of the day.    Aa nuclear stress test on 04/08/2015 was low risk.  Post-rest ejection fraction was 71%.  There was normal perfusion.  An echo Doppler study on 04/07/2015 showed an ejection fraction at 55-60%.  There was grade 1 diastolic dysfunction.  There was mild mitral annular calcification with trace MR and trace TR.  She stopped taking statin therapy.  She had blood work done by [employer] 1 Jerry 15 2018, which I reviewed.  TSH was normal at 0.98.  Vitamin D was 68.8.  Lipid studies were significantly increased with a total  cholesterol 239, LDL 142, non-HDL 168, triglycerides 127, and HDL 71.  She continues to use her BiPAP for previously documented complex sleep apnea..   When I last saw her in September 2018 she denied any episodes of chest pain but was experiencing some shortness of breath with activity, which had not significantly changed.  She underwent a follow-up echo Doppler study on 05/17/2017.  This continued to show normal systolic function with an ejection fraction of 60-65%.  There was grade 1 diastolic dysfunction.  There was moderate aortic insufficiency without stenosis, trivial MR, mild TR, moderate pulmonary regurgitation, and mild elevation of PA peak pressure 33 mm.   She and surgery of her left foot with a neurectomy.  She continues to use BiPAP with 100% compliance and her DME company is [employer].  I was able to obtain a download from August 25 3 05/05/2017.  This showed 100% compliance.  AHI was 3.3.  However, her sleep duration was reduced at only 4 hours and 29 minutes per night.  She's had some issues with sleep maintenance.  She typically goes to bed between 11 and 11:30.  Oftentimes she does not put the mask on when she would return from the bathroom.    Since I last saw her in November 2018 she has remained fairly stable.  However she presented to the [facility] emergency room in December 2018 with complaints of dizziness.  She also had had some issues with blood pressure lability.  Her symptoms had started after she had recovered from shingles episode.  She was seen in the office in follow-up by [name], NP in January 2019 and was feeling improved.  Due to complaints of prior palpitations associated with dizziness she wore a monitor for 1 week which did not reveal any significant abnormality.  She was in sinus rhythm throughout and had isolated unifocal PVCs.  There were no episodes of atrial fibrillation.  Presently she denies chest pain PND orthopnea.  She still gets occasional shortness of breath  and she admits to being tired and having no energy.  She continues to use BiPAP and has a full facemask.  [employer] is her DME.  Past Medical History:  Diagnosis Date  . ACE-inhibitor cough   . COPD (chronic obstructive pulmonary disease) ([place])   . Diastolic dysfunction   . History of tobacco use    smoked 25 years  . Hypertension   . Mild CAD   . Mild pulmonary hypertension ([place])   . OSA (obstructive sleep apnea)    Sleep Study 04/07/2007 - AHI during total sleep 21.30/hr, during REM 20.16/hr - BiPAP auto servo-ventilation unit    Past Surgical History:  Procedure Laterality Date  . benign lesion removed on lung  2000  . BLADDER REPAIR    . CARDIAC CATHETERIZATION  05/20/2007   mild coronary obstructive disease with 20% narrowing in prox LAD, diffuse luminal irregularity of 40-50% in mid RCA (Dr. Corky Downs)  . Cardiopulmonary Met Test  01/28/2012   with PFTs - FEV1 & FEV1/VC WNL, VC WNL, DLCO WNL; abnormal pulmonary response  . Carotid Doppler  04/2011   normal patency  . CARPAL TUNNEL RELEASE     x2  . CATARACT EXTRACTION, BILATERAL    . GALLBLADDER SURGERY    . NM MYOCAR PERF WALL MOTION  09/2011   lexiscan myoview - normal perfusion, EF 77%, low risk  . Renal Doppler  05/2007   normal renal arteries   . TONSILLECTOMY    . TRANSTHORACIC ECHOCARDIOGRAM  2013   EF 50-55%; mild MR; mild TR; mild pulm valve regurg; aortic root sclerosis/calcification  . VAGINAL HYSTERECTOMY    . VARICOSE VEIN SURGERY      Allergies  Allergen Reactions  . Acetaminophen Anaphylaxis and Other (See Comments)    Difficulty urinating Difficulty urinating   . Zetia [Ezetimibe] Other (See Comments)    Chest pressure  . Gabapentin     dizziness  . Hydrochlorothiazide Nausea And Vomiting    Dizziness  . Ace Inhibitors Cough  . Codeine Other (See Comments)    difficulty urinating  . Erythromycin Other (See Comments)    DOESN'T REMEMBER   . Etodolac Nausea Only  . Ivp Dye [Iodinated [employer]  [employer]] Hives    hives    Current Outpatient Medications  Medication Sig Dispense Refill  . amLODipine (NORVASC) 5 MG tablet TAKE 1 TABLET EVERY DAY 90 tablet 2  . aspirin 81 MG tablet Take 81 mg by mouth daily.    . Calcium 1500 MG tablet Take 1,500 mg by mouth daily.    . cholecalciferol (VITAMIN D) 1000 UNITS tablet Take 1 tablet by mouth daily. Take 1 tab daily    . NON FORMULARY at bedtime. BiPAP    . omega-3 acid ethyl esters (LOVAZA) 1 g capsule Take by mouth.    . Omega-3 Fatty Acids (FISH OIL) 1000 MG CAPS Take 1 capsule by mouth 3 (three) times daily.    . RESTASIS 0.05 % ophthalmic emulsion Place 1 drop into both eyes 2 (two)  times daily. Use 1 drop in each eye twice a day    . rOPINIRole (REQUIP) 0.25 MG tablet Take 0.25 mg by mouth as needed.    . rosuvastatin (CRESTOR) 5 MG tablet TAKE 1 TABLET EVERY OTHER DAY 45 tablet 1   No current facility-administered medications for this visit.    Facility-Administered Medications Ordered in Other Visits  Medication Dose Route Frequency Provider Last Rate Last Dose  . aminophylline injection 75 mg  75 mg Intravenous TID PRN Larey Dresser, MD   75 mg at 04/07/15 1105    Social History   Socioeconomic History  . Marital status: Widowed    Spouse name: Not on file  . Number of children: 3  . Years of education: Not on file  . Highest education level: Not on file  Occupational History  . Occupation: retired  Scientific laboratory technician  . Financial resource strain: Not on file  . Food insecurity:    Worry: Not on file    Inability: Not on file  . Transportation needs:    Medical: Not on file    Non-medical: Not on file  Tobacco Use  . Smoking status: Former Smoker    Packs/day: 0.50    Years: 25.00    Pack years: 12.50    Types: Cigarettes    Last attempt to quit: 08/13/1984    Years since quitting: 33.9  . Smokeless tobacco: Never Used  Substance and Sexual Activity  . Alcohol use: No  . Drug use: No  . Sexual activity: Not on  file  Lifestyle  . Physical activity:    Days per week: Not on file    Minutes per session: Not on file  . Stress: Not on file  Relationships  . Social connections:    Talks on phone: Not on file    Gets together: Not on file    Attends religious service: Not on file    Active member of club or organization: Not on file    Attends meetings of clubs or organizations: Not on file    Relationship status: Not on file  . Intimate partner violence:    Fear of current or ex partner: Not on file    Emotionally abused: Not on file    Physically abused: Not on file    Forced sexual activity: Not on file  Other Topics Concern  . Not on file  Social History Narrative  . Not on file    Socially, she is widowed, has 3 children, 8 grandchildren 5 great-grandchildren.  There is a remote tobacco history but she quit many years ago.  Family History  Problem Relation Age of Onset  . Lung cancer Mother   . Suicidality Father   . Cancer Sister   . Breast cancer Daughter   . Stroke Sister    ROS General: Negative; No fevers, chills, or night sweats;  HEENT: Negative; No changes in vision or hearing, sinus congestion, difficulty swallowing Pulmonary: Negative; No cough, wheezing, shortness of breath, hemoptysis Cardiovascular: See history of present illness Positive for mild shortness of breath with activity, unchanged.;  Admits to ankle edema GI: Negative; No nausea, vomiting, diarrhea, or abdominal pain GU: Negative; No dysuria, hematuria, or difficulty voiding Musculoskeletal: Negative; no myalgias, joint pain, or weakness Hematologic/Oncology: Negative; no easy bruising, bleeding Endocrine: Negative; no heat/cold intolerance; no diabetes Neuro: Negative; no changes in balance, headaches Skin: Negative; No rashes or skin lesions Psychiatric: Negative; No behavioral problems, depression Sleep: Positive for complex  sleep apnea, on therapy; No snoring, daytime sleepiness, hypersomnolence,  bruxism, restless legs, hypnogognic hallucinations, no cataplexy Other comprehensive 14 point system review is negative.   PE BP 126/78   Pulse 72   Ht 4' 11.5" (1.511 m)   Wt 154 lb 3.2 oz (69.9 kg)   BMI 30.62 kg/m    Repeat blood pressure by me was 128/78  Wt Readings from Last 3 Encounters:  07/03/18 154 lb 3.2 oz (69.9 kg)  09/23/17 150 lb (68 kg)  09/02/17 150 lb (68 kg)   General: Alert, oriented, no distress.  Skin: normal turgor, no rashes, warm and dry HEENT: Normocephalic, atraumatic. Pupils equal round and reactive to light; sclera anicteric; extraocular muscles intact; Nose without nasal septal hypertrophy Mouth/Parynx benign; Mallinpatti scale 3 Neck: No JVD, no carotid bruits; normal carotid upstroke Lungs: clear to ausculatation and percussion; no wheezing or rales Chest wall: without tenderness to palpitation Heart: PMI not displaced, RRR, s1 s2 normal, 1/6 systolic murmur, no diastolic murmur, no rubs, gallops, thrills, or heaves Abdomen: soft, nontender; no hepatosplenomehaly, BS+; abdominal aorta nontender and not dilated by palpation. Back: no CVA tenderness Pulses 2+ Musculoskeletal: full range of motion, normal strength, no joint deformities Extremities: no clubbing cyanosis or edema, Homan's sign negative  Neurologic: grossly nonfocal; Cranial nerves grossly wnl Psychologic: Normal mood and affect   ECG (independently read by me): Sinus rhythm at 72 bpm.  Poor anterior R wave progression.  Normal intervals.  No ectopy.  ECG (independently read by me): Sinus rhythm at 68 bpm with an isolated PVC, nonspecific T changes.  QTc interval 459 ms.  September 2018 ECG (independently read by me): Normal sinus rhythm at 76 bpm.  Nonspecific T-wave changes.  March 2018  ECG (independently read by me): Normal sinus rhythm at 68 bpm.  Normal intervals.  Poor R wave progression anteriorly.  October 2017 ECG (independently read by me): Normal sinus rhythm at 66  bpm.  Poor R wave progression.  Normal intervals.  October 2016 ECG (independently read by me): Normal sinus rhythm at 66 bpm.  Nonspecific T changes.  September 2015 ECG (independently read by me): Normal sinus rhythm at 62 beats per minute.  Poor progression anteriorly.  Nonspecific ST changes  12/03/2013 ECG (independently read by me): Normal sinus rhythm at 62 beats per minute.  Nonspecific ST changes.  No ectopy  LABS:  BMP Latest Ref Rng & Units 09/02/2017 07/26/2017 07/26/2017  Glucose 65 - 99 mg/dL 96 114(H) 111(H)  BUN 8 - 27 mg/dL 22 21(H) 17  Creatinine 0.57 - 1.00 mg/dL 1.40(H) 1.00 1.22(H)  BUN/Creat Ratio 12 - 28 16 - -  Sodium 134 - 144 mmol/L 138 137 135  Potassium 3.5 - 5.2 mmol/L 4.2 3.5 3.5  Chloride 96 - 106 mmol/L 96 97(L) 99(L)  CO2 20 - 29 mmol/L 23 - 24  Calcium 8.7 - 10.3 mg/dL 9.2 - 9.4   Hepatic Function Latest Ref Rng & Units 07/26/2017 05/03/2017 01/16/2017  Total Protein 6.5 - 8.1 g/dL 7.5 7.1 7.1  Albumin 3.5 - 5.0 g/dL 3.5 4.1 3.8  AST 15 - 41 U/L '19 17 18  ' ALT 14 - 54 U/L '17 13 16  ' Alk Phosphatase 38 - 126 U/L 68 75 62  Total Bilirubin 0.3 - 1.2 mg/dL 0.6 0.4 0.4   CBC Latest Ref Rng & Units 07/26/2017 07/26/2017 05/03/2017  WBC 4.0 - 10.5 K/uL - 9.6 8.1  Hemoglobin 12.0 - 15.0 g/dL 15.6(H) 14.8 13.7  Hematocrit 36.0 -  46.0 % 46.0 42.1 41.2  Platelets 150 - 400 K/uL - 302 304   Lab Results  Component Value Date   MCV 88.1 07/26/2017   MCV 88 05/03/2017   MCV 89.7 06/11/2016   Lab Results  Component Value Date   TSH 0.880 05/03/2017   No results found for: HGBA1C  Lipid Panel     Component Value Date/Time   CHOL 214 (H) 05/03/2017 1229   TRIG 97 05/03/2017 1229   HDL 63 05/03/2017 1229   CHOLHDL 3.4 05/03/2017 1229   CHOLHDL 2.9 01/16/2017 0817   VLDL 31 (H) 01/16/2017 0817   LDLCALC 132 (H) 05/03/2017 1229    RADIOLOGY: No results found.  IMPRESSION:  1. Essential hypertension   2. Complex sleep apnea syndrome   3. Grade I  diastolic dysfunction   4. Hyperlipidemia, unspecified hyperlipidemia type   5. Mild CAD   6. Palpitations     ASSESSMENT AND PLAN: Ms. Karalyn Kadel is a very pleasant young appearing 84 year old female who has a long-standing history of hypertension and has complex sleep apnea on BiPAP therapy.  Her blood pressure today is controlled on amlodipine 5 mg.  Remotely she had been on ARB therapy but ultimately was switched due to valsartan impurity.  She had also been on HCTZ but is no longer on this.  Her edema has resolved from her last office visit.  She has a history of stage III chronic kidney disease but renal function has continuously improved and when checked in February 2019 was 0.99.  She is tolerating rosuvastatin 5 mg every other day for hyperlipidemia with most recent LDL at 86.  She has been intolerant to Zetia and did not tolerate higher dose statin.  She is on omega-3 fatty acid.  She continues to use BiPAP with 100% compliance.  She admits to being fatigued but feels she is sleeping well.  I reviewed her cardiac monitor and her ER evaluation.  Her most recent echo had shown normal systolic function with grade 1 diastolic dysfunction, moderate AR without stenosis, mild TR as well as moderate PR with PA peak pressure slightly elevated at 33 mm.  She will continue her current regimen.  As long as she remains stable, I will see her in 1 year for reevaluation.  Time spent: 25 minutes  Troy Sine, MD, [facility]  07/04/2018 3:00 PM

## 2018-07-03 NOTE — Patient Instructions (Signed)
Medication Instructions:  Your physician recommends that you continue on your current medications as directed. Please refer to the Current Medication list given to you today.  If you need a refill on your cardiac medications before your next appointment, please call your pharmacy.   Follow-Up: At [facility], you and your health needs are our priority.  As part of our continuing mission to provide you with exceptional heart care, we have created designated Provider Care Teams.  These Care Teams include your primary Cardiologist (physician) and Advanced Practice Providers (APPs -  Physician Assistants and Nurse Practitioners) who all work together to provide you with the care you need, when you need it. You will need a follow up appointment in 12 months.  Please call our office 2 months in advance to schedule this appointment.  You may see Dr. Claiborne Billings or one of the following Advanced Practice Providers on your designated Care Team: [place], [place] . Fabian Sharp, PA-C

## 2018-07-04 ENCOUNTER — Encounter: Payer: Self-pay | Admitting: Cardiovascular Disease

## 2018-09-12 ENCOUNTER — Encounter (INDEPENDENT_AMBULATORY_CARE_PROVIDER_SITE_OTHER): Payer: Self-pay

## 2018-09-12 ENCOUNTER — Ambulatory Visit: Payer: Medicare HMO | Admitting: Physician Assistant

## 2018-09-12 VITALS — BP 138/72 | Ht 59.0 in | Wt 155.2 lb

## 2018-09-12 DIAGNOSIS — I2510 Atherosclerotic heart disease of native coronary artery without angina pectoris: Secondary | ICD-10-CM | POA: Diagnosis not present

## 2018-09-12 DIAGNOSIS — E785 Hyperlipidemia, unspecified: Secondary | ICD-10-CM | POA: Diagnosis not present

## 2018-09-12 DIAGNOSIS — R0600 Dyspnea, unspecified: Secondary | ICD-10-CM | POA: Diagnosis not present

## 2018-09-12 DIAGNOSIS — J449 Chronic obstructive pulmonary disease, unspecified: Secondary | ICD-10-CM | POA: Diagnosis not present

## 2018-09-12 DIAGNOSIS — I10 Essential (primary) hypertension: Secondary | ICD-10-CM | POA: Diagnosis not present

## 2018-09-12 MED ORDER — LOSARTAN POTASSIUM 25 MG PO TABS: 25.0000 mg | ORAL_TABLET | Freq: Every day | ORAL | 0 refills | Status: DC

## 2018-09-12 NOTE — Progress Notes (Signed)
Cardiology Office Note    Date:  09/15/2018   ID:  Jo, Hendrix Jun 09, 1927, MRN [id number]  PCP:  Jonathon Jordan, MD  Cardiologist:  Dr. Claiborne Billings  Chief Complaint  Patient presents with  . Follow-up    seen for Dr. Claiborne Billings.     History of Present Illness:  Jo Hendrix is a 85 y.o. female with PMH of HTN, COPD, CAD and OSA.  She had a cardiac catheterization in 2008 after developing recurrent chest pain.  This revealed very mild nonobstructive CAD with 20% proximal LAD with mid LAD systolic bridging, diffuse luminal irregularities of 40 to 50% in the RCA.  Cardiopulmonary stress test revealed abnormal functional status with peak oxygen consumption of 105%.  She did have abnormal pulmonary response but no ventilation/perfusion mismatch.  She has been intolerant to statins as well as Zetia.  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  Myoview obtained in August 2016 was low risk.  EF 71%, normal perfusion.  Echocardiogram obtained in August 2016 showed EF 55 to 60%, grade 1 DD, mild annular calcification with trace MR and trace TR.  She had repeat echocardiogram in October 2018 that showed EF 60 to 65%, grade 1 DD, moderate aortic insufficiency without stenosis, trivial MR.  She was started on very low-dose Crestor 5 mg every other day.  She was last seen by Dr. Claiborne Billings in November 2019, 1 year follow-up was recommended.  Patient presents today for evaluation of shortness of breath and fatigue.  On further questioning, she says this has been going on ever since she was started on amlodipine since 2018.  There has been no change recently.  She denies any recent chest discomfort.  She is concerned of some occasional lower extremity edema although I did not see significant lower extremity edema on physical exam.  Her lung is clear on physical exam as well.  It is not entirely clear to me whether her symptom is truly related to amlodipine, I will however switch the amlodipine to losartan as a trial.   I will obtain blood work in 1 week include basic metabolic panel, CBC and a TSH to rule out secondary causes for dyspnea and fatigue.  She will return in 2 to 3 weeks for reassessment.  If her symptom does not get any better on the losartan, I likely will repeat echocardiogram.   Past Medical History:  Diagnosis Date  . ACE-inhibitor cough   . COPD (chronic obstructive pulmonary disease) ([place])   . Diastolic dysfunction   . History of tobacco use    smoked 25 years  . Hypertension   . Mild CAD   . Mild pulmonary hypertension ([place])   . OSA (obstructive sleep apnea)    Sleep Study 04/07/2007 - AHI during total sleep 21.30/hr, during REM 20.16/hr - BiPAP auto servo-ventilation unit    Past Surgical History:  Procedure Laterality Date  . benign lesion removed on lung  2000  . BLADDER REPAIR    . CARDIAC CATHETERIZATION  05/20/2007   mild coronary obstructive disease with 20% narrowing in prox LAD, diffuse luminal irregularity of 40-50% in mid RCA (Dr. Corky Downs)  . Cardiopulmonary Met Test  01/28/2012   with PFTs - FEV1 & FEV1/VC WNL, VC WNL, DLCO WNL; abnormal pulmonary response  . Carotid Doppler  04/2011   normal patency  . CARPAL TUNNEL RELEASE     x2  . CATARACT EXTRACTION, BILATERAL    . GALLBLADDER SURGERY    .  NM MYOCAR PERF WALL MOTION  09/2011   lexiscan myoview - normal perfusion, EF 77%, low risk  . Renal Doppler  05/2007   normal renal arteries   . TONSILLECTOMY    . TRANSTHORACIC ECHOCARDIOGRAM  2013   EF 50-55%; mild MR; mild TR; mild pulm valve regurg; aortic root sclerosis/calcification  . VAGINAL HYSTERECTOMY    . VARICOSE VEIN SURGERY      Current Medications: Outpatient Medications Prior to Visit  Medication Sig Dispense Refill  . aspirin 81 MG tablet Take 81 mg by mouth daily.    . Calcium 1500 MG tablet Take 1,500 mg by mouth daily.    . cholecalciferol (VITAMIN D) 1000 UNITS tablet Take 1 tablet by mouth daily. Take 1 tab daily    . NON FORMULARY at  bedtime. BiPAP    . omega-3 acid ethyl esters (LOVAZA) 1 g capsule Take by mouth.    . Omega-3 Fatty Acids (FISH OIL) 1000 MG CAPS Take 1 capsule by mouth 3 (three) times daily.    . RESTASIS 0.05 % ophthalmic emulsion Place 1 drop into both eyes 2 (two) times daily. Use 1 drop in each eye twice a day    . rOPINIRole (REQUIP) 0.25 MG tablet Take 0.25 mg by mouth as needed.    . rosuvastatin (CRESTOR) 5 MG tablet TAKE 1 TABLET EVERY OTHER DAY 45 tablet 1  . amLODipine (NORVASC) 5 MG tablet TAKE 1 TABLET EVERY DAY 90 tablet 2   Facility-Administered Medications Prior to Visit  Medication Dose Route Frequency Provider Last Rate Last Dose  . aminophylline injection 75 mg  75 mg Intravenous TID PRN Larey Dresser, MD   75 mg at 04/07/15 1105     Allergies:   Acetaminophen; Zetia [ezetimibe]; Gabapentin; Hydrochlorothiazide; Ace inhibitors; Codeine; Erythromycin; Etodolac; and Ivp dye [iodinated diagnostic agents]   Social History   Socioeconomic History  . Marital status: Widowed    Spouse name: Not on file  . Number of children: 3  . Years of education: Not on file  . Highest education level: Not on file  Occupational History  . Occupation: retired  Scientific laboratory technician  . Financial resource strain: Not on file  . Food insecurity:    Worry: Not on file    Inability: Not on file  . Transportation needs:    Medical: Not on file    Non-medical: Not on file  Tobacco Use  . Smoking status: Former Smoker    Packs/day: 0.50    Years: 25.00    Pack years: 12.50    Types: Cigarettes    Last attempt to quit: 08/13/1984    Years since quitting: 34.1  . Smokeless tobacco: Never Used  Substance and Sexual Activity  . Alcohol use: No  . Drug use: No  . Sexual activity: Not on file  Lifestyle  . Physical activity:    Days per week: Not on file    Minutes per session: Not on file  . Stress: Not on file  Relationships  . Social connections:    Talks on phone: Not on file    Gets together:  Not on file    Attends religious service: Not on file    Active member of club or organization: Not on file    Attends meetings of clubs or organizations: Not on file    Relationship status: Not on file  Other Topics Concern  . Not on file  Social History Narrative  . Not on file  Family History:  The patient's family history includes Breast cancer in her daughter; Cancer in her sister; Lung cancer in her mother; Stroke in her sister; Suicidality in her father.   ROS:   Please see the history of present illness.    ROS All other systems reviewed and are negative.   PHYSICAL EXAM:   VS:  BP 138/72   Ht _0  (1.499 m)   Wt 155 lb 3.2 oz (70.4 kg)   BMI 31.35 kg/m    GEN: Well nourished, well developed, in no acute distress  HEENT: normal  Neck: no JVD, carotid bruits, or masses Cardiac: RRR; no murmurs, rubs, or gallops,no edema  Respiratory:  clear to auscultation bilaterally, normal work of breathing GI: soft, nontender, nondistended, + BS MS: no deformity or atrophy  Skin: warm and dry, no rash Neuro:  Alert and Oriented x 3, Strength and sensation are intact Psych: euthymic mood, full affect  Wt Readings from Last 3 Encounters:  09/12/18 155 lb 3.2 oz (70.4 kg)  07/03/18 154 lb 3.2 oz (69.9 kg)  09/23/17 150 lb (68 kg)      Studies/Labs Reviewed:   EKG:  EKG is not ordered today.    Recent Labs: No results found for requested labs within last 8760 hours.   Lipid Panel    Component Value Date/Time   CHOL 214 (H) 05/03/2017 1229   TRIG 97 05/03/2017 1229   HDL 63 05/03/2017 1229   CHOLHDL 3.4 05/03/2017 1229   CHOLHDL 2.9 01/16/2017 0817   VLDL 31 (H) 01/16/2017 0817   LDLCALC 132 (H) 05/03/2017 1229    Additional studies/ records that were reviewed today include:   Echo 05/17/2017 LV EF: 60% -   65%  ------------------------------------------------------------------- Indications:      Dyspnea (R06.00).  Study Conclusions  - Left ventricle:  The cavity size was normal. Systolic function was   normal. The estimated ejection fraction was in the range of 60%   to 65%. Wall motion was normal; there were no regional wall   motion abnormalities. Doppler parameters are consistent with   abnormal left ventricular relaxation (grade 1 diastolic   dysfunction). Doppler parameters are consistent with   indeterminate ventricular filling pressure. - Aortic valve: Transvalvular velocity was within the normal range.   There was no stenosis. There was moderate regurgitation.   Regurgitation pressure half-time: 291 ms. - Mitral valve: Mildly calcified annulus. Transvalvular velocity   was within the normal range. There was no evidence for stenosis.   There was trivial regurgitation. - Right ventricle: The cavity size was normal. Wall thickness was   normal. Systolic function was normal. - Atrial septum: No defect or patent foramen ovale was identified. - Tricuspid valve: There was mild regurgitation. - Pulmonic valve: There was moderate regurgitation. - Pulmonary arteries: Systolic pressure was within the normal   range. PA peak pressure: 33 mm Hg (S).    ASSESSMENT:    1. Dyspnea, unspecified type   2. Mild CAD   3. Essential hypertension   4. Hyperlipidemia, unspecified hyperlipidemia type   5. Chronic obstructive pulmonary disease, unspecified COPD type ([place])      PLAN:  In order of problems listed above:  1. Dyspnea: Patient attributed the symptoms to amlodipine, amlodipine was added to her medication list in 2018.  It is unclear to me if amlodipine is truly causing her symptom, I will switch amlodipine to losartan as a trial.  She will need complete metabolic panel, CBC and a  TSH in 1 week.  Her dyspnea also does not seems to occur with exertion either, if it can occur at anytime.  2. CAD: Denies any recent chest pain, previous cardiac catheterization was in 2008 that showed minimal disease.  3. Hypertension: Blood pressure  stable.  I will discontinue her amlodipine and switch her to losartan as a trial  4. Hyperlipidemia: On Crestor 5 mg every other day  5. COPD: No recent exacerbation, this can potentially cause her dyspnea, although patient says her dyspnea occurred shortly after she was placed on amlodipine in 2018.  There has been no significant increase recently.    Medication Adjustments/Labs and Tests Ordered: Current medicines are reviewed at length with the patient today.  Concerns regarding medicines are outlined above.  Medication changes, Labs and Tests ordered today are listed in the Patient Instructions below. Patient Instructions  Medication Instructions:  STOP Amlodipine  START Losartan 25 mg daily.  If you need a refill on your cardiac medications before your next appointment, please call your pharmacy.   Lab work: Your physician recommends that you return for lab work in: 1 week--CMET, CBC, TSH  If you have labs (blood work) drawn today and your tests are completely normal, you will receive your results only by: Marland Kitchen MyChart Message (if you have MyChart) OR . A paper copy in the mail If you have any lab test that is abnormal or we need to change your treatment, we will call you to review the results.   Follow-Up: At [facility], you and your health needs are our priority.  As part of our continuing mission to provide you with exceptional heart care, we have created designated Provider Care Teams.  These Care Teams include your primary Cardiologist (physician) and Advanced Practice Providers (APPs -  Physician Assistants and Nurse Practitioners) who all work together to provide you with the care you need, when you need it. . Please schedule a follow-up appointment to see Almyra Deforest, PA in 3 weeks.  Any Other Special Instructions Will Be Listed Below (If Applicable). None      Hilbert Corrigan, [place]  09/15/2018 1:23 AM    [facility] [place],  [place], [place]  [address] Phone: [phone]; Fax: [phone]

## 2018-09-12 NOTE — Patient Instructions (Signed)
Medication Instructions:  STOP Amlodipine  START Losartan 25 mg daily.  If you need a refill on your cardiac medications before your next appointment, please call your pharmacy.   Lab work: Your physician recommends that you return for lab work in: 1 week--CMET, CBC, TSH  If you have labs (blood work) drawn today and your tests are completely normal, you will receive your results only by: Marland Kitchen MyChart Message (if you have MyChart) OR . A paper copy in the mail If you have any lab test that is abnormal or we need to change your treatment, we will call you to review the results.   Follow-Up: At [facility], you and your health needs are our priority.  As part of our continuing mission to provide you with exceptional heart care, we have created designated Provider Care Teams.  These Care Teams include your primary Cardiologist (physician) and Advanced Practice Providers (APPs -  Physician Assistants and Nurse Practitioners) who all work together to provide you with the care you need, when you need it. . Please schedule a follow-up appointment to see Jo Deforest, PA in 3 weeks.  Any Other Special Instructions Will Be Listed Below (If Applicable). None

## 2018-09-15 ENCOUNTER — Encounter: Payer: Self-pay | Admitting: Physician Assistant

## 2018-09-16 ENCOUNTER — Telehealth: Payer: Self-pay | Admitting: Cardiovascular Disease

## 2018-09-16 NOTE — Telephone Encounter (Signed)
Pt c/o BP issue: STAT if pt c/o blurred vision, one-sided weakness or slurred speech  1. What are your last 5 BP readings? 171/76.164/80, 160/76 157/81 146/81when patient was last seen it was 138/72  2. Are you having any other symptoms (ex. Dizziness, headache, blurred vision, passed out)? Patient is short breath and dizzies a little  3. What is your BP issue? Patient was put on new medication

## 2018-09-16 NOTE — Telephone Encounter (Signed)
Discussed with Janan Ridge PA and will have patient increase Losartan to 50 mg daily. Advised patient, verbalized understanding. She will double up on the Losartan 25 mg tablets and call when she needs refill so 50 mg tablets can be sent to pharmacy. She would prefer to see how this works before sending new Rx.

## 2018-09-16 NOTE — Telephone Encounter (Signed)
Spoke with patient and she stated since [employer] PA changed her Amlodipine to Losartan her blood pressure has been going up. She takes her Losartan in the evening and her blood pressure this as 171/76. Blood pressure readings since visit 164/80, 160/76 157/81 146/81when patient was last seen it was 138/72. Patient stated she was getting concerned since readings are going up. Amlodipine was stopped secondary to fatigue and shortness of breath. Fatigue is better but still having shortness of breath. Will forward to [employer] PA for review

## 2018-09-17 NOTE — Telephone Encounter (Signed)
I discussed with Rip Harbour to increase losartan

## 2018-09-20 ENCOUNTER — Telehealth: Payer: Self-pay | Admitting: Cardiology

## 2018-09-20 NOTE — Telephone Encounter (Signed)
Received outpatient call from patient regarding elevated blood pressure in the 451Q to 604N systolic and 99Y to low 721L diastolic.  She was recently seen in the office 09/12/18 and amlodipine was discontinued due to fatigue and dyspnea.  She was started on losartan 25 mg.  She called the office due to issues with persistent hypertension and was advised to increase to 50 mg daily.  She reports that she took 50 mg last night.  Blood pressure still elevated.  She feels shaky.  No significant chest pain or dyspnea.  She is not on any additional antihypertensives. She is due to come back to the office on Monday for repeat laboratory work.  Given persistently elevated blood pressure I advised patient to further increase losartan to 100 mg daily.  I will route notification to the [facility] line office to see if we can perhaps get her in for an RN visit for repeat blood pressure check on Monday when she comes in for laboratory work.  We will also route notification to provider to provide further recommendations. Pt advised to avoid sodium and caffeine over the weekend. She will continue to monitor pressures at home.   Lyda Jester, PA-C

## 2018-09-22 ENCOUNTER — Ambulatory Visit (INDEPENDENT_AMBULATORY_CARE_PROVIDER_SITE_OTHER): Payer: Medicare HMO | Admitting: *Deleted

## 2018-09-22 VITALS — BP 180/86 | Temp 97.8°F | Wt 156.4 lb

## 2018-09-22 DIAGNOSIS — I10 Essential (primary) hypertension: Secondary | ICD-10-CM

## 2018-09-22 DIAGNOSIS — Z5181 Encounter for therapeutic drug level monitoring: Secondary | ICD-10-CM | POA: Diagnosis not present

## 2018-09-22 DIAGNOSIS — R0600 Dyspnea, unspecified: Secondary | ICD-10-CM

## 2018-09-22 NOTE — Telephone Encounter (Signed)
Follow up    Patient is calling after speaking with Ellen Henri over the weekend. She is coming in for labs today but wants to know can a nurse be available per [place] to check her BP. Please call to discuss.

## 2018-09-22 NOTE — Patient Instructions (Addendum)
Medication Instructions:  OK TO USE AMLODIPINE 5 MG DAILY AS NEEDED FOR TOP NUMBER OF BLOOD PRESSURE 180 OR ABOVE  If you need a refill on your cardiac medications before your next appointment, please call your pharmacy.   Lab work: CBC/TSH/CMET TODAY   If you have labs (blood work) drawn today and your tests are completely normal, you will receive your results only by: Marland Kitchen MyChart Message (if you have MyChart) OR . A paper copy in the mail If you have any lab test that is abnormal or we need to change your treatment, we will call you to review the results.  Testing/Procedures: NONE  Follow-Up: Thursday    Any Other Special Instructions Will Be Listed Below (If Applicable). CONTINUE TO MONITOR YOUR BLOOD PRESSURE TWICE A DAY AND BRING YOUR MACHINE TO FOLLOW UP

## 2018-09-22 NOTE — Telephone Encounter (Signed)
Spoke with patient and she is coming this morning for blood pressure check. She will bring her at home blood pressure machine to visit

## 2018-09-22 NOTE — Progress Notes (Signed)
Patient came to office for blood pressure check. Her blood pressure was 180/86 HR 74. Patient stated her blood pressure has actually gone up since changing to Losartan which was increased to 100 mg daily. She has been having chills that last about 5 minutes 2 to 3 times a day, temp 98.7 She continues to have shortness of breath, worse with exertion.  Discussed with Janan Ridge PA and will have patient get labs today, move up appointment sooner, and ok to take Amlodipine 5 mg daily as needed for systolic blood pressure of 180 or more. Patients appointment moved to 09/25/18.

## 2018-09-23 LAB — COMPREHENSIVE METABOLIC PANEL
ALT: 17 IU/L (ref 0–32)
AST: 14 IU/L (ref 0–40)
Albumin/Globulin Ratio: 1.3 (ref 1.2–2.2)
Albumin: 3.9 g/dL (ref 3.6–4.6)
Alkaline Phosphatase: 83 IU/L (ref 39–117)
BUN/Creatinine Ratio: 15 (ref 12–28)
BUN: 15 mg/dL (ref 8–27)
Bilirubin Total: 0.3 mg/dL (ref 0.0–1.2)
CO2: 24 mmol/L (ref 20–29)
Calcium: 9.4 mg/dL (ref 8.7–10.3)
Chloride: 102 mmol/L (ref 96–106)
Creatinine, Ser: 0.99 mg/dL (ref 0.57–1.00)
GFR calc Af Amer: 58 mL/min/{1.73_m2} — ABNORMAL LOW (ref >59)
GFR calc non Af Amer: 51 mL/min/{1.73_m2} — ABNORMAL LOW (ref >59)
GLUCOSE: 87 mg/dL (ref 65–99)
Globulin, Total: 2.9 g/dL (ref 1.5–4.5)
Potassium: 4.9 mmol/L (ref 3.5–5.2)
Sodium: 140 mmol/L (ref 134–144)
Total Protein: 6.8 g/dL (ref 6.0–8.5)

## 2018-09-23 LAB — CBC WITH DIFFERENTIAL/PLATELET
Basophils Absolute: 0 10*3/uL (ref 0.0–0.2)
Basos: 1 %
EOS (ABSOLUTE): 0.2 10*3/uL (ref 0.0–0.4)
Eos: 2 %
Hematocrit: 41.5 % (ref 34.0–46.6)
Hemoglobin: 13.9 g/dL (ref 11.1–15.9)
Immature Grans (Abs): 0 10*3/uL (ref 0.0–0.1)
Immature Granulocytes: 1 %
Lymphocytes Absolute: 1.8 10*3/uL (ref 0.7–3.1)
Lymphs: 21 %
MCH: 29.2 pg (ref 26.6–33.0)
MCHC: 33.5 g/dL (ref 31.5–35.7)
MCV: 87 fL (ref 79–97)
Monocytes Absolute: 0.7 10*3/uL (ref 0.1–0.9)
Monocytes: 8 %
NEUTROS PCT: 67 %
Neutrophils Absolute: 6.1 10*3/uL (ref 1.4–7.0)
Platelets: 323 10*3/uL (ref 150–450)
RBC: 4.76 x10E6/uL (ref 3.77–5.28)
RDW: 13.2 % (ref 11.7–15.4)
WBC: 8.8 10*3/uL (ref 3.4–10.8)

## 2018-09-23 LAB — TSH: TSH: 1.26 u[IU]/mL (ref 0.450–4.500)

## 2018-09-24 ENCOUNTER — Ambulatory Visit: Payer: Medicare HMO | Admitting: Cardiology

## 2018-09-25 ENCOUNTER — Encounter: Payer: Self-pay | Admitting: Adult Health

## 2018-09-25 ENCOUNTER — Ambulatory Visit: Payer: Medicare HMO | Admitting: Adult Health

## 2018-09-25 VITALS — BP 162/86 | HR 69 | Ht 59.0 in | Wt 153.4 lb

## 2018-09-25 DIAGNOSIS — I519 Heart disease, unspecified: Secondary | ICD-10-CM

## 2018-09-25 DIAGNOSIS — I10 Essential (primary) hypertension: Secondary | ICD-10-CM

## 2018-09-25 DIAGNOSIS — I2510 Atherosclerotic heart disease of native coronary artery without angina pectoris: Secondary | ICD-10-CM | POA: Diagnosis not present

## 2018-09-25 MED ORDER — AMLODIPINE BESYLATE 5 MG PO TABS: 5.0000 mg | ORAL_TABLET | Freq: Every day | ORAL | 0 refills | Status: DC

## 2018-09-25 NOTE — Patient Instructions (Signed)
Medication Instructions:  Restart amlodipine 5mg  daily Stop losartan  If you need a refill on your cardiac medications before your next appointment, please call your pharmacy.  Labwork: NONE  When you have your labs (blood work) drawn today and your tests are completely normal, you will receive your results only by MyChart Message (if you have MyChart) -OR-  A paper copy in the mail.  If you have any lab test that is abnormal or we need to change your treatment, we will call you to review these results.  Testing/Procedures: Echocardiogram - Your physician has requested that you have an echocardiogram. Echocardiography is a painless test that uses sound waves to create images of your heart. It provides your doctor with information about the size and shape of your heart and how well your heart's chambers and valves are working. This procedure takes approximately one hour. There are no restrictions for this procedure. This will be performed at our [facility] location - [address], [address].  Special Instructions: Your physician wants you to follow-up in: 2 WEEKS FOR BP CHECK WITH Ivalee Strauser(MAKE SURE THAT KL IS IN OFFICE)  Follow-Up: You will need a follow up appointment in 6 weeks.  You may see Shelva Majestic, MD [name], DNP, AACC  or one of the following Advanced Practice Providers on your designated Care Team: Almyra Deforest, [place]  Fabian Sharp, PA-C   At [facility], you and your health needs are our priority.  As part of our continuing mission to provide you with exceptional heart care, we have created designated Provider Care Teams.  These Care Teams include your primary Cardiologist (physician) and Advanced Practice Providers (APPs -  Physician Assistants and Nurse Practitioners) who all work together to provide you with the care you need, when you need it.  Thank you for choosing [facility] at [facility]!!

## 2018-09-25 NOTE — Progress Notes (Signed)
Cardiology Office Note   Date:  09/25/2018   ID:  Daphene, Chisholm 1927/02/28, MRN [id number]  PCP:  Jonathon Jordan, MD  Cardiologist:  Dr. Claiborne Billings   Chief Complaint  Patient presents with  . Hypertension     History of Present Illness: Jo Hendrix is a 85 y.o. female who presents for ongoing assessment and management of HTN, CAD, with other history of COPD and complex OSA. Cath in 2008 demonstrated minimal non-obstructive CAD with 20% proximal LAD with mid LAD systolic bridging and diffuse luminal irregularities of 40%-50% in the RCA. She is intolerant to to statins and Zetia. Now on low dose Crestor 5 mg  every other day.  She continues on CPAP for complex OSA. On last office visit 09/12/2018 with Almyra Deforest, she was complaining of dyspnea. Last echocardiogram Last echo in 05/2017 revealed normal EF of 60%-65%. The patient felt it was related to amlodipine although she had been on it since 2018.    She was switched to losartan, to see if she tolerated this better. Follow up labs were ordered to include TSH, CBC, and BMET. Losartan was increased from 50 mg to 100 mg on 09/16/2018 as her BP was rising since changing from amlodipine.She was still having dyspnea. She was to take amlodipine 5 mg prn for elevated BP.  Follow up labs revealed worsening GFR of 58. With normal creatinine of 0.99. No evidence of anemia, TSH was normal.   She comes today with a record of her BP and they are continuing to be elevated at 696 and 789'F systolic. She did take an a dose of amlodipine which did bring her BP down to 142/78.   Past Medical History:  Diagnosis Date  . ACE-inhibitor cough   . COPD (chronic obstructive pulmonary disease) ([place])   . Diastolic dysfunction   . History of tobacco use    smoked 25 years  . Hypertension   . Mild CAD   . Mild pulmonary hypertension ([place])   . OSA (obstructive sleep apnea)    Sleep Study 04/07/2007 - AHI during total sleep 21.30/hr, during REM 20.16/hr - BiPAP auto  servo-ventilation unit    Past Surgical History:  Procedure Laterality Date  . benign lesion removed on lung  2000  . BLADDER REPAIR    . CARDIAC CATHETERIZATION  05/20/2007   mild coronary obstructive disease with 20% narrowing in prox LAD, diffuse luminal irregularity of 40-50% in mid RCA (Dr. Corky Downs)  . Cardiopulmonary Met Test  01/28/2012   with PFTs - FEV1 & FEV1/VC WNL, VC WNL, DLCO WNL; abnormal pulmonary response  . Carotid Doppler  04/2011   normal patency  . CARPAL TUNNEL RELEASE     x2  . CATARACT EXTRACTION, BILATERAL    . GALLBLADDER SURGERY    . NM MYOCAR PERF WALL MOTION  09/2011   lexiscan myoview - normal perfusion, EF 77%, low risk  . Renal Doppler  05/2007   normal renal arteries   . TONSILLECTOMY    . TRANSTHORACIC ECHOCARDIOGRAM  2013   EF 50-55%; mild MR; mild TR; mild pulm valve regurg; aortic root sclerosis/calcification  . VAGINAL HYSTERECTOMY    . VARICOSE VEIN SURGERY       Current Outpatient Medications  Medication Sig Dispense Refill  . amLODipine (NORVASC) 5 MG tablet Take 5 mg by mouth as directed. Daily as needed for SBP 180 or above.    Marland Kitchen aspirin 81 MG tablet Take 81 mg by mouth daily.    Marland Kitchen  Calcium 1500 MG tablet Take 1,500 mg by mouth daily.    . cholecalciferol (VITAMIN D) 1000 UNITS tablet Take 1 tablet by mouth daily. Take 1 tab daily    . losartan (COZAAR) 25 MG tablet Take 25 mg by mouth as directed. Take 2 tablets by mouth daily    . NON FORMULARY at bedtime. BiPAP    . omega-3 acid ethyl esters (LOVAZA) 1 g capsule Take by mouth.    . Omega-3 Fatty Acids (FISH OIL) 1000 MG CAPS Take 1 capsule by mouth 3 (three) times daily.    . RESTASIS 0.05 % ophthalmic emulsion Place 1 drop into both eyes 2 (two) times daily. Use 1 drop in each eye twice a day    . rOPINIRole (REQUIP) 0.25 MG tablet Take 0.25 mg by mouth as needed.    . rosuvastatin (CRESTOR) 5 MG tablet TAKE 1 TABLET EVERY OTHER DAY 45 tablet 1   No current facility-administered  medications for this visit.    Facility-Administered Medications Ordered in Other Visits  Medication Dose Route Frequency Provider Last Rate Last Dose  . aminophylline injection 75 mg  75 mg Intravenous TID PRN Larey Dresser, MD   75 mg at 04/07/15 1105    Allergies:   Acetaminophen; Zetia [ezetimibe]; Gabapentin; Hydrochlorothiazide; Ace inhibitors; Codeine; Erythromycin; Etodolac; and Ivp dye [iodinated diagnostic agents]    Social History:  The patient  reports that she quit smoking about 34 years ago. Her smoking use included cigarettes. She has a 12.50 pack-year smoking history. She has never used smokeless tobacco. She reports that she does not drink alcohol or use drugs.   Family History:  The patient's family history includes Breast cancer in her daughter; Cancer in her sister; Lung cancer in her mother; Stroke in her sister; Suicidality in her father.    ROS: All other systems are reviewed and negative. Unless otherwise mentioned in H&P    PHYSICAL EXAM: VS:  BP (!) 162/86   Pulse 69   Ht 4' 11" (1.499 m)   Wt 153 lb 6.4 oz (69.6 kg)   BMI 30.98 kg/m  , BMI Body mass index is 30.98 kg/m. GEN: Well nourished, well developed, in no acute distress HEENT: normal Neck: no JVD, carotid bruits, or masses Cardiac: RRR; no murmurs, rubs, or gallops,no edema  Respiratory:  Clear to auscultation bilaterally, normal work of breathing GI: soft, nontender, nondistended, + BS MS: no deformity or atrophy Skin: warm and dry, no rash Neuro:  Strength and sensation are intact Psych: euthymic mood, full affect   EKG:  Not completed this office visit.   Recent Labs: 09/22/2018: ALT 17; BUN 15; Creatinine, Ser 0.99; Hemoglobin 13.9; Platelets 323; Potassium 4.9; Sodium 140; TSH 1.260    Lipid Panel    Component Value Date/Time   CHOL 214 (H) 05/03/2017 1229   TRIG 97 05/03/2017 1229   HDL 63 05/03/2017 1229   CHOLHDL 3.4 05/03/2017 1229   CHOLHDL 2.9 01/16/2017 0817   VLDL 31  (H) 01/16/2017 0817   LDLCALC 132 (H) 05/03/2017 1229      Wt Readings from Last 3 Encounters:  09/25/18 153 lb 6.4 oz (69.6 kg)  09/22/18 156 lb 6.4 oz (70.9 kg)  09/12/18 155 lb 3.2 oz (70.4 kg)    Other studies Reviewed: Echocardiogram 06-14-18  Left ventricle: The cavity size was normal. Systolic function was   normal. The estimated ejection fraction was in the range of 60%   to 65%. Wall motion was  normal; there were no regional wall   motion abnormalities. Doppler parameters are consistent with   abnormal left ventricular relaxation (grade 1 diastolic   dysfunction). Doppler parameters are consistent with   indeterminate ventricular filling pressure. - Aortic valve: Transvalvular velocity was within the normal range.   There was no stenosis. There was moderate regurgitation.   Regurgitation pressure half-time: 291 ms. - Mitral valve: Mildly calcified annulus. Transvalvular velocity   was within the normal range. There was no evidence for stenosis.   There was trivial regurgitation. - Right ventricle: The cavity size was normal. Wall thickness was   normal. Systolic function was normal. - Atrial septum: No defect or patent foramen ovale was identified. - Tricuspid valve: There was mild regurgitation. - Pulmonic valve: There was moderate regurgitation. - Pulmonary arteries: Systolic pressure was within the normal   range. PA peak pressure: 33 mm Hg (S).   ASSESSMENT AND PLAN:  1. Hypertension: She is having a more difficult time keeping BP under control with change to losartan. She was better controlled on amlodipine even though she felt more fatigue. She is now more concerned about BP control.  She will be changed back to amlodipine as she had better response to this. Labs are reviewed and are all essentially normal with the exception of GFR.  She will stop the losartan. We will see her in 2 weeks for BP check.   In the interim, she will have echocardiogram for  reassessment of LV fx as recommended by Desiree Hane on last visit.   2. CAD: She has had a cardiac cath in 2008 demonstrating minimal non-obstructive CAD with a 20% proximal LAD, with mid LAD systolic bridging and diffuse luminal irregularities of 40%-50% in the RCA,. May consider ischemic work up once BP is under control in the setting of worsening fatigue if this is becoming more prominent.   3. OSA: She remains on CPAP and followed by Dr. Claiborne Billings.   Current medicines are reviewed at length with the patient today.    Labs/ tests ordered today include: Echo  Phill Myron. West Pugh, ANP, [facility]   09/25/2018 11:34 AM    [facility] [address] Office [phone] Fax [phone]

## 2018-10-02 ENCOUNTER — Ambulatory Visit (HOSPITAL_COMMUNITY): Payer: Medicare HMO | Attending: Cardiovascular Disease

## 2018-10-02 DIAGNOSIS — I2510 Atherosclerotic heart disease of native coronary artery without angina pectoris: Secondary | ICD-10-CM | POA: Diagnosis not present

## 2018-10-02 DIAGNOSIS — I519 Heart disease, unspecified: Secondary | ICD-10-CM | POA: Diagnosis not present

## 2018-10-02 DIAGNOSIS — I272 Pulmonary hypertension, unspecified: Secondary | ICD-10-CM | POA: Insufficient documentation

## 2018-10-06 ENCOUNTER — Ambulatory Visit: Payer: Medicare HMO | Admitting: Physician Assistant

## 2018-10-08 ENCOUNTER — Ambulatory Visit (INDEPENDENT_AMBULATORY_CARE_PROVIDER_SITE_OTHER): Payer: Medicare HMO

## 2018-10-08 VITALS — BP 145/73 | HR 71 | Ht 59.0 in | Wt 156.4 lb

## 2018-10-08 DIAGNOSIS — I10 Essential (primary) hypertension: Secondary | ICD-10-CM | POA: Diagnosis not present

## 2018-10-08 MED ORDER — AMLODIPINE BESYLATE 10 MG PO TABS: 10.0000 mg | ORAL_TABLET | Freq: Every day | ORAL | 6 refills | Status: DC

## 2018-10-08 NOTE — Patient Instructions (Signed)
PLEASE  INCREASE AMLODIPINE TO 10MG  DAILY  PLEASE CALL IF THIS DOES NOT HELP

## 2018-10-08 NOTE — Progress Notes (Signed)
PT IS HERE FOR BP CHECK. SHE HAS STARTED HER AMLODIPINE AND SHE IS TAKING THIS IN THE EVENING. HER BP IS HIGH AT FIRST CHECK, WILL CHECK AGAIN IN 5 MINUTES. HER WEIGHT IS UP 3#. WILL DISCUSS THIS KATHRYN LAWRENCE DNP,AACC AND SEE WHAT SHE WANTS TO DO.  D/W KATHRYN LAWRENCE DNP,AACC  INCREASE AMLODIPINE TO 10MG  DAILY

## 2018-10-14 ENCOUNTER — Telehealth: Payer: Self-pay

## 2018-10-14 NOTE — Telephone Encounter (Signed)
Amlodipine is not know to cause major issues with SOB, but is patient can not breath well; please STOP amlodipine.  Noted she is on losartan 50mg  daily (25mg  tabs x 2). She should increase losartan to 75mg  daily to maintain BP control and keep f/u appointment with APP on 11/06/2018.

## 2018-10-14 NOTE — Telephone Encounter (Signed)
New Message   Pt c/o medication issue:  1. Name of Medication: Amlodipine   2. How are you currently taking this medication (dosage and times per day)? 1 x daily 10 mg   3. Are you having a reaction (difficulty breathing--STAT)? No  4. What is your medication issue? Pt is calling because she has questions about how she is taking this medication

## 2018-10-14 NOTE — Telephone Encounter (Signed)
Returned call to patient she stated Amlodipine was increased to 10 mg daily on 10/08/18.Stated she was sob when she was on 5 mg.Stated sob is worse since increased to 10 mg.B/P today 148/72 pulse 72.Advised I will send message to [facility] for advice.

## 2018-10-14 NOTE — Telephone Encounter (Signed)
Can you please advise, patient is having increase SOB since starting the increased dose of 10 mg. Thank you!

## 2018-10-15 NOTE — Telephone Encounter (Signed)
Patient called in, states that the below messages are not what she called for.  She originally called to ask about the directions on the 10 mg amlodipine. Signature states to take daily, and then to take daily as needed for SBP above 180. Last clinic note from nurse visit only states to take the 10 mg daily. Should she take it as needed for the SBP above 180, or take it on a daily basis.  Please advise. Thank you!

## 2018-10-16 MED ORDER — AMLODIPINE BESYLATE 10 MG PO TABS: 10.0000 mg | ORAL_TABLET | Freq: Every day | ORAL | 6 refills | Status: DC

## 2018-10-16 NOTE — Telephone Encounter (Signed)
Not sure how the prescription got sent in as it is.  She should take 10 mg once daily.  No added doses, regardless of SBP, 10 mg is maximum daily dose.  Please re-send rx to pharmacy with just instructions of 10 mg qd.

## 2018-10-16 NOTE — Telephone Encounter (Signed)
New Message   Patient would like to speak to a nurse about the issue.

## 2018-10-16 NOTE — Telephone Encounter (Signed)
Patient is still complaining of SOB- I advised of note below.  Hendrix, Jo, RPH 2 days ago     Amlodipine is not know to cause major issues with SOB, but is patient can not breath well; please STOP amlodipine.  Noted she is on losartan 50mg  daily (25mg  tabs x 2). She should increase losartan to 75mg  daily to maintain BP control and keep f/u appointment with APP on 11/06/2018.     Patient does want to try this plan instead of doing the Amlodipine right now, and to keep appointment on 11/06/2018. Patient will monitor BP.

## 2018-10-16 NOTE — Telephone Encounter (Signed)
Can you please advise of this so I can tell the patient?

## 2018-10-17 NOTE — Telephone Encounter (Signed)
Noted. Thanks.

## 2018-10-17 NOTE — Telephone Encounter (Signed)
Agree with recommendations from pharmacist.

## 2018-11-05 ENCOUNTER — Ambulatory Visit: Payer: Medicare HMO | Admitting: Adult Health

## 2018-11-05 ENCOUNTER — Other Ambulatory Visit: Payer: Self-pay

## 2018-11-05 ENCOUNTER — Encounter: Payer: Self-pay | Admitting: Adult Health

## 2018-11-05 ENCOUNTER — Telehealth: Payer: Self-pay

## 2018-11-05 VITALS — BP 177/83 | HR 69 | Ht 59.0 in | Wt 153.6 lb

## 2018-11-05 DIAGNOSIS — I10 Essential (primary) hypertension: Secondary | ICD-10-CM | POA: Diagnosis not present

## 2018-11-05 NOTE — Telephone Encounter (Signed)
CALL AND CHECK PT'S BP Friday 4-3.-2020

## 2018-11-05 NOTE — Patient Instructions (Signed)
Medication Instructions:  TAKE AMLODIPINE IN THE AM  TAKE LOSARTAN IN THE PM If you need a refill on your cardiac medications before your next appointment, please call your pharmacy.  Special Instructions: CONTINUE TO TAKE AND LOG YOUR BP AND HR DAILY  Follow-Up: You will need a follow up appointment in 3 months. You may see Shelva Majestic, MD , [name], DNP, AACC  or one of the following Advanced Practice Providers on your designated Care Team:  Almyra Deforest, PA-C  Fabian Sharp, PA-C      At [facility], you and your health needs are our priority.  As part of our continuing mission to provide you with exceptional heart care, we have created designated Provider Care Teams.  These Care Teams include your primary Cardiologist (physician) and Advanced Practice Providers (APPs -  Physician Assistants and Nurse Practitioners) who all work together to provide you with the care you need, when you need it.  Thank you for choosing [facility] at [facility]!!

## 2018-11-05 NOTE — Progress Notes (Signed)
Cardiology Office Note   Date:  11/05/2018   ID:  [name], [name] 1927/02/26, MRN [id number]  PCP:  Jonathon Jordan, MD  Cardiologist:  Dr. Claiborne Billings   Chief Complaint  Patient presents with  . Hypertension  . Shortness of Breath     History of Present Illness: Jo Hendrix is a 85 y.o. female who presents for ongoing assessment and management of HTN, CAD, with hx of COPD, complex OSA. Cardiac cath in 2008 demonstrated minimal non-obstructive CAD with  20% proximal LAD with mid LAD systolic bridging and diffuse luminal irregularities of 40%-50% in the RCA. She is intolerant to to statins and Zetia. Now on low dose Crestor 5 mg  every other day.  She was last seen in the office on 09/25/2018 at which time she was still hypertensive. She was changed back to amlodipine and losartan was discontinued. She was to have follow up echocardiogram She has 2 week BP check on 10/08/2018 and BP was not optimal. She increased to amlodipine 10 mg. She has called several times about increased dyspnea with higher dose of amlodipine.   She comes today confused again about her medications. She is not taking her amlodipine as directed. She is only taking losartan at HS.  She is hypertensive today.   Past Medical History:  Diagnosis Date  . ACE-inhibitor cough   . COPD (chronic obstructive pulmonary disease) ([place])   . Diastolic dysfunction   . History of tobacco use    smoked 25 years  . Hypertension   . Mild CAD   . Mild pulmonary hypertension ([place])   . OSA (obstructive sleep apnea)    Sleep Study 04/07/2007 - AHI during total sleep 21.30/hr, during REM 20.16/hr - BiPAP auto servo-ventilation unit    Past Surgical History:  Procedure Laterality Date  . benign lesion removed on lung  2000  . BLADDER REPAIR    . CARDIAC CATHETERIZATION  05/20/2007   mild coronary obstructive disease with 20% narrowing in prox LAD, diffuse luminal irregularity of 40-50% in mid RCA (Dr. Corky Downs)  . Cardiopulmonary Met  Test  01/28/2012   with PFTs - FEV1 & FEV1/VC WNL, VC WNL, DLCO WNL; abnormal pulmonary response  . Carotid Doppler  04/2011   normal patency  . CARPAL TUNNEL RELEASE     x2  . CATARACT EXTRACTION, BILATERAL    . GALLBLADDER SURGERY    . NM MYOCAR PERF WALL MOTION  09/2011   lexiscan myoview - normal perfusion, EF 77%, low risk  . Renal Doppler  05/2007   normal renal arteries   . TONSILLECTOMY    . TRANSTHORACIC ECHOCARDIOGRAM  2013   EF 50-55%; mild MR; mild TR; mild pulm valve regurg; aortic root sclerosis/calcification  . VAGINAL HYSTERECTOMY    . VARICOSE VEIN SURGERY       Current Outpatient Medications  Medication Sig Dispense Refill  . amLODipine (NORVASC) 10 MG tablet Take 1 tablet (10 mg total) by mouth daily. 30 tablet 6  . aspirin 81 MG tablet Take 81 mg by mouth daily.    . Calcium 1500 MG tablet Take 1,500 mg by mouth daily.    . cholecalciferol (VITAMIN D) 1000 UNITS tablet Take 1 tablet by mouth daily. Take 1 tab daily    . losartan (COZAAR) 25 MG tablet Take 25 mg by mouth as directed. Take 2 tablets by mouth daily    . NON FORMULARY at bedtime. BiPAP    . omega-3 acid ethyl esters (LOVAZA)  1 g capsule Take by mouth.    . Omega-3 Fatty Acids (FISH OIL) 1000 MG CAPS Take 1 capsule by mouth 3 (three) times daily.    . RESTASIS 0.05 % ophthalmic emulsion Place 1 drop into both eyes 2 (two) times daily. Use 1 drop in each eye twice a day    . rOPINIRole (REQUIP) 0.25 MG tablet Take 0.25 mg by mouth as needed.    . rosuvastatin (CRESTOR) 5 MG tablet TAKE 1 TABLET EVERY OTHER DAY 45 tablet 1   No current facility-administered medications for this visit.    Facility-Administered Medications Ordered in Other Visits  Medication Dose Route Frequency Provider Last Rate Last Dose  . aminophylline injection 75 mg  75 mg Intravenous TID PRN Larey Dresser, MD   75 mg at 04/07/15 1105    Allergies:   Acetaminophen; Zetia [ezetimibe]; Gabapentin; Hydrochlorothiazide; Ace  inhibitors; Codeine; Erythromycin; Etodolac; and Ivp dye [iodinated diagnostic agents]    Social History:  The patient  reports that she quit smoking about 34 years ago. Her smoking use included cigarettes. She has a 12.50 pack-year smoking history. She has never used smokeless tobacco. She reports that she does not drink alcohol or use drugs.   Family History:  The patient's family history includes Breast cancer in her daughter; Cancer in her sister; Lung cancer in her mother; Stroke in her sister; Suicidality in her father.    ROS: All other systems are reviewed and negative. Unless otherwise mentioned in H&P    PHYSICAL EXAM: VS:  BP (!) 177/83 (BP Location: Left Arm, Patient Position: Sitting, Cuff Size: Normal)   Pulse 69   Ht _0  (1.499 m)   Wt 153 lb 9.6 oz (69.7 kg)   BMI 31.02 kg/m  , BMI Body mass index is 31.02 kg/m. GEN: Well nourished, well developed, in no acute distress HEENT: normal Neck: no JVD, carotid bruits, or masses Cardiac: RRR; no murmurs, rubs, or gallops,no edema  Respiratory:  Clear to auscultation bilaterally, normal work of breathing GI: soft, nontender, nondistended, + BS MS: no deformity or atrophy Skin: warm and dry, no rash Neuro:  Strength and sensation are intact Psych: euthymic mood, full affect   EKG:   Not completed this office visit.   Recent Labs: 09/22/2018: ALT 17; BUN 15; Creatinine, Ser 0.99; Hemoglobin 13.9; Platelets 323; Potassium 4.9; Sodium 140; TSH 1.260    Lipid Panel    Component Value Date/Time   CHOL 214 (H) 05/03/2017 1229   TRIG 97 05/03/2017 1229   HDL 63 05/03/2017 1229   CHOLHDL 3.4 05/03/2017 1229   CHOLHDL 2.9 01/16/2017 0817   VLDL 31 (H) 01/16/2017 0817   LDLCALC 132 (H) 05/03/2017 1229      Wt Readings from Last 3 Encounters:  11/05/18 153 lb 9.6 oz (69.7 kg)  10/08/18 156 lb 6.4 oz (70.9 kg)  09/25/18 153 lb 6.4 oz (69.6 kg)      Other studies Reviewed: Echocardiogram 24-Oct-2018 1. The left  ventricle has normal systolic function with an ejection fraction of 60-65%. The cavity size was normal. Left ventricular diastolic Doppler parameters are consistent with impaired relaxation.  2. The right ventricle has normal systolic function. The cavity was normal. There is no increase in right ventricular wall thickness.  3. The mitral valve is normal in structure.  4. The tricuspid valve is normal in structure.  5. The aortic valve is normal in structure.  6. The pulmonic valve was grossly normal. Pulmonic valve  regurgitation is mild by color flow Doppler.  7. The aortic root and ascending aorta are normal in size and structure.  8. The interatrial septum was not assessed.  ASSESSMENT AND PLAN:  1.  Hypertension: She is not taking her antihypertensive medications as directed. I have explained again how she is to take her medications. She is to take amlodipine 10 mg in the am , and losartan 25 mg in the pm. She is to take one 5 mg dose today when she gets home today due to hypertension, and begin the 10 mg dose tomorrow morning of amlodipine. She is to take her BP daily and record.  We will check in with her in a week to see how she is. Due to COVID 19, we will not see her for at least 3 months in person. I have explained this to her and she verbalized understanding. She prefers not to have Web X visit if this is necessary, as she does not have a computer. She would rather have a phone visit. This is noted.    Current medicines are reviewed at length with the patient today.    Labs/ tests ordered today include: None   Phill Myron. West Pugh, ANP, AACC   11/05/2018 3:18 PM    [facility] [address] Office [phone] Fax [phone]

## 2018-11-06 ENCOUNTER — Ambulatory Visit: Payer: Medicare HMO | Admitting: Adult Health

## 2018-11-10 ENCOUNTER — Other Ambulatory Visit: Payer: Self-pay | Admitting: Physician Assistant

## 2018-11-10 DIAGNOSIS — G4733 Obstructive sleep apnea (adult) (pediatric): Secondary | ICD-10-CM | POA: Diagnosis not present

## 2018-11-13 ENCOUNTER — Telehealth: Payer: Self-pay | Admitting: Adult Health

## 2018-11-13 NOTE — Telephone Encounter (Signed)
She is having essentially normal BP fluctuations. She did have one elevated BP, but otherwise I am okay with them. Unless she is symptomatic, she can continue her current medication regimen. Follow up appointment for her in 6 weeks unless already scheduled.

## 2018-11-13 NOTE — Telephone Encounter (Signed)
New Message    Pt c/o BP issue: STAT if pt c/o blurred vision, one-sided weakness or slurred speech  1. What are your last 5 BP readings?  11/11/18 129/70 hr74   162/78  hr78 11/12/18  142/72 hr72  144/72  hr73 11/13/18  141/74 hr 80  2. Are you having any other symptoms (ex. Dizziness, headache, blurred vision, passed out)? no  3. What is your BP issue? Pt is calling to report her BP readings after her medication change

## 2018-11-13 NOTE — Telephone Encounter (Signed)
See other message-appt scheduled das requested by [facility]

## 2018-11-13 NOTE — Telephone Encounter (Signed)
Called pt informed of Jo Hendrix's message, she states that she is not have any sx. I scheduled a follow up appointment 5-18 she will continue to take BP to review and will CB if anything changes

## 2018-11-24 ENCOUNTER — Other Ambulatory Visit: Payer: Self-pay | Admitting: Adult Health

## 2018-11-28 ENCOUNTER — Other Ambulatory Visit: Payer: Self-pay | Admitting: Cardiovascular Disease

## 2018-11-28 NOTE — Telephone Encounter (Signed)
Rosuvastatin 5 mg refilled.

## 2018-12-03 ENCOUNTER — Telehealth: Payer: Self-pay | Admitting: Adult Health

## 2018-12-03 ENCOUNTER — Encounter: Payer: Self-pay | Admitting: Cardiovascular Disease

## 2018-12-03 ENCOUNTER — Ambulatory Visit (INDEPENDENT_AMBULATORY_CARE_PROVIDER_SITE_OTHER): Payer: Medicare HMO | Admitting: Cardiovascular Disease

## 2018-12-03 ENCOUNTER — Other Ambulatory Visit: Payer: Self-pay

## 2018-12-03 VITALS — BP 168/72 | HR 94 | Resp 12 | Ht 59.0 in | Wt 154.8 lb

## 2018-12-03 DIAGNOSIS — R002 Palpitations: Secondary | ICD-10-CM | POA: Diagnosis not present

## 2018-12-03 DIAGNOSIS — I2510 Atherosclerotic heart disease of native coronary artery without angina pectoris: Secondary | ICD-10-CM

## 2018-12-03 DIAGNOSIS — R5383 Other fatigue: Secondary | ICD-10-CM | POA: Diagnosis not present

## 2018-12-03 DIAGNOSIS — R42 Dizziness and giddiness: Secondary | ICD-10-CM | POA: Diagnosis not present

## 2018-12-03 DIAGNOSIS — E785 Hyperlipidemia, unspecified: Secondary | ICD-10-CM

## 2018-12-03 DIAGNOSIS — I10 Essential (primary) hypertension: Secondary | ICD-10-CM

## 2018-12-03 DIAGNOSIS — I5189 Other ill-defined heart diseases: Secondary | ICD-10-CM

## 2018-12-03 DIAGNOSIS — G4731 Primary central sleep apnea: Secondary | ICD-10-CM

## 2018-12-03 DIAGNOSIS — I519 Heart disease, unspecified: Secondary | ICD-10-CM

## 2018-12-03 MED ORDER — METOPROLOL SUCCINATE ER 25 MG PO TB24: 12.5000 mg | ORAL_TABLET | Freq: Every day | ORAL | 0 refills | Status: DC

## 2018-12-03 NOTE — Telephone Encounter (Signed)
Spoke with pt, Follow up scheduled with dr Claiborne Billings today.

## 2018-12-03 NOTE — Telephone Encounter (Signed)
Spoke with pt, her bp this morning is 136/74. She reports dizziness that has been there from the beginning of taking bp medications and is getting worse. She reports the dizziness occurs with just sitting in the chair and her head spins. Explained to patient it sounds like vertigo, she does not want to take any allergy medication. She reports she is always SOB with any exertion and reports this has been present since starting bp medications. She has swelling in her feet by the end of the day, none in the morning. There has been no weight gain. She also reports nausea to the point of not being able to eat. She feels like a lot of her symptoms are related to amlodipine. She has a follow up with kathryn lawrence dnp in may but does not want to wait that long. Will forward to Kersey to review and advise. She did not take her amlodipine this morning.

## 2018-12-03 NOTE — Telephone Encounter (Signed)
With these multiple complaints and intolerances, she should be seen by the DOD. I think it would be safer for her instead of treating all of this over the phone.   Thank you, Curt Bears

## 2018-12-03 NOTE — Telephone Encounter (Signed)
New message   Pt c/o medication issue:  1. Name of Medication: amlodipine, losartin  2. How are you currently taking this medication (dosage and times per day)? 1 time daily foe each medication  3. Are you having a reaction (difficulty breathing--STAT)? No  4. What is your medication issue? Patient states that she is dizzy, sob, nauseated, weak. Patient wants to know if these symptoms could be coming from her medication?

## 2018-12-03 NOTE — Progress Notes (Signed)
Patient ID: Jo Hendrix, female   DOB: 05-09-1927, 85 y.o.   MRN: [id number]    PCP: Dr. Jonathon Jordan  HPI: Jo Hendrix is a 85 y.o. female who presents to the office for a 5 month cardiology evaluation.  Jo Hendrix has a long-standing history of hypertension with documented grade 1 diastolic dysfunction.  She also has a history of complex sleep apnea with documented mild pulmonary hypertension and has been on BiPAP. In October 2008 she underwent cardiac catheterization after developing recurrent episodes of chest pain.  This showed mild coronary obstructive disease with 20% narrowing of the proximal LAD with mid LAD systolic bridging and diffuse luminal irregularities of 40-50% in the RCA.  She has had difficulty with shortness of breath and has been diagnosed with COPD/emphysema.  A cardiopulmonary met test  revealed normal functional status with peak oxygen consumption 105%, but she had an abnormal pulmonary response with no ventilation perfusion mismatch and with plus/minus dead space.  She has been intolerant to statin therapy as well as Zetia.  She continues to use BiPAP for her complex sleep apnea.  She had noticed progressive increasing shortness of breath with activity and had experienced some very mild chest tightness when she is short of breath.  She also admitted to some mild lower extremity edema particularly involving the left leg by the end of the day.    Aa nuclear stress test on 04/08/2015 was low risk.  Post-rest ejection fraction was 71%.  There was normal perfusion.  An echo Doppler study on 04/07/2015 showed an ejection fraction at 55-60%.  There was grade 1 diastolic dysfunction.  There was mild mitral annular calcification with trace MR and trace TR.  She stopped taking statin therapy.  She had blood work done by [employer] 1 Jerry 15 2018, which I reviewed.  TSH was normal at 0.98.  Vitamin D was 68.8.  Lipid studies were significantly increased with a total cholesterol  239, LDL 142, non-HDL 168, triglycerides 127, and HDL 71.  She continues to use her BiPAP for previously documented complex sleep apnea..   When I  saw her in September 2018 she denied any episodes of chest pain but was experiencing some shortness of breath with activity, which had not significantly changed.  She underwent a follow-up echo Doppler study on 05/17/2017.  This continued to show normal systolic function with an ejection fraction of 60-65%.  There was grade 1 diastolic dysfunction.  There was moderate aortic insufficiency without stenosis, trivial MR, mild TR, moderate pulmonary regurgitation, and mild elevation of PA peak pressure 33 mm.   She and surgery of her left foot with a neurectomy.  She continues to use BiPAP with 100% compliance and her DME company is [employer].  I was able to obtain a download from August 25 3 05/05/2017.  This showed 100% compliance.  AHI was 3.3.  However, her sleep duration was reduced at only 4 hours and 29 minutes per night.  She's had some issues with sleep maintenance.  She typically goes to bed between 11 and 11:30.  Oftentimes she does not put the mask on when she would return from the bathroom.    She presented to the [facility] ([place] emergency room in December 2018 with complaints of dizziness.  She also had had some issues with blood pressure lability.  Her symptoms had started after she had recovered from shingles episode.  She was seen in the office in follow-up by Jo Busing, NP in January  2019 and was feeling improved.  Due to complaints of prior palpitations associated with dizziness she wore a monitor for 1 week which did not reveal any significant abnormality.  She was in sinus rhythm throughout and had isolated unifocal PVCs.  There were no episodes of atrial fibrillation.    I last saw her in November 2019 at which time she denied any chest pain, PND orthopnea.  She still experience occasional episodes of shortness of breath and admitted to being tired and  at times having no energy.  She was continuing to use BiPAP therapy and has a full facemask with [employer] as her DME company.   She has been seen by [name], NP since my last evaluation.  She underwent a follow-up echo Doppler study on October 02, 2018 which showed normal systolic function with EF at 60 to 65%.  There was grade 1 diastolic dysfunction.  Atrial size was normal.  She had mild MR, mild TR.  Mild PR.  Gently, she continues to experience some weakness and some fatigue but also admits to occasional sensation of chest fluttering.  She denies any chest tightness.  She has continued to use BiPAP and admits to 100% compliance.  However, with further questioning she is sleeping for inadequate duration.  She typically goes to bed around 11 PM and oftentimes is up at 430.  She does not routinely sleep 8 hours.  Has been monitoring her blood pressure seems to run in the 130s to 140s but occasionally has increased to 160.  Her pulse typically is in the 70s.  She presents for evaluation.  Past Medical History:  Diagnosis Date   ACE-inhibitor cough    COPD (chronic obstructive pulmonary disease) (HCC)    Diastolic dysfunction    History of tobacco use    smoked 25 years   Hypertension    Mild CAD    Mild pulmonary hypertension (HCC)    OSA (obstructive sleep apnea)    Sleep Study 04/07/2007 - AHI during total sleep 21.30/hr, during REM 20.16/hr - BiPAP auto servo-ventilation unit    Past Surgical History:  Procedure Laterality Date   benign lesion removed on lung  2000   BLADDER REPAIR     CARDIAC CATHETERIZATION  05/20/2007   mild coronary obstructive disease with 20% narrowing in prox LAD, diffuse luminal irregularity of 40-50% in mid RCA (Dr. Corky Downs)   Cardiopulmonary Met Test  01/28/2012   with PFTs - FEV1 & FEV1/VC WNL, VC WNL, DLCO WNL; abnormal pulmonary response   Carotid Doppler  04/2011   normal patency   CARPAL TUNNEL RELEASE     x2   CATARACT  EXTRACTION, BILATERAL     GALLBLADDER SURGERY     NM MYOCAR PERF WALL MOTION  09/2011   lexiscan myoview - normal perfusion, EF 77%, low risk   Renal Doppler  05/2007   normal renal arteries    TONSILLECTOMY     TRANSTHORACIC ECHOCARDIOGRAM  2013   EF 50-55%; mild MR; mild TR; mild pulm valve regurg; aortic root sclerosis/calcification   VAGINAL HYSTERECTOMY     VARICOSE VEIN SURGERY      Allergies  Allergen Reactions   Acetaminophen Anaphylaxis and Other (See Comments)    Difficulty urinating Difficulty urinating    Zetia [Ezetimibe] Other (See Comments)    Chest pressure   Gabapentin     dizziness   Hydrochlorothiazide Nausea And Vomiting    Dizziness   Ace Inhibitors Cough   Codeine Other (See  Comments)    difficulty urinating   Erythromycin Other (See Comments)    DOESN'T REMEMBER    Etodolac Nausea Only   Ivp Dye [Iodinated Diagnostic Agents] Hives    hives    Current Outpatient Medications  Medication Sig Dispense Refill   amLODipine (NORVASC) 10 MG tablet Take 1 tablet (10 mg total) by mouth daily. 30 tablet 6   aspirin 81 MG tablet Take 81 mg by mouth daily.     Calcium 1500 MG tablet Take 1,500 mg by mouth daily.     cholecalciferol (VITAMIN D) 1000 UNITS tablet Take 1 tablet by mouth daily. Take 1 tab daily     losartan (COZAAR) 25 MG tablet Take 1 tablet by mouth once daily 90 tablet 2   NON FORMULARY at bedtime. BiPAP     omega-3 acid ethyl esters (LOVAZA) 1 g capsule Take by mouth.     Omega-3 Fatty Acids (FISH OIL) 1000 MG CAPS Take 1 capsule by mouth 3 (three) times daily.     RESTASIS 0.05 % ophthalmic emulsion Place 1 drop into both eyes 2 (two) times daily. Use 1 drop in each eye twice a day     rOPINIRole (REQUIP) 0.25 MG tablet Take 0.25 mg by mouth as needed.     rosuvastatin (CRESTOR) 5 MG tablet TAKE 1 TABLET EVERY OTHER DAY 45 tablet 1   amLODipine (NORVASC) 5 MG tablet TAKE 1 TABLET (5 MG TOTAL) BY MOUTH DAILY AS  NEEDED FOR SYSTOLIC BLOOD PRESSURE 540 OR ABOVE. (Patient not taking: Reported on 12/03/2018) 90 tablet 2   metoprolol succinate (TOPROL-XL) 25 MG 24 hr tablet Take 0.5 tablets (12.5 mg total) by mouth at bedtime. 90 tablet 0   No current facility-administered medications for this visit.    Facility-Administered Medications Ordered in Other Visits  Medication Dose Route Frequency Provider Last Rate Last Dose   aminophylline injection 75 mg  75 mg Intravenous TID PRN Larey Dresser, MD   75 mg at 04/07/15 1105    Social History   Socioeconomic History   Marital status: Widowed    Spouse name: Not on file   Number of children: 3   Years of education: Not on file   Highest education level: Not on file  Occupational History   Occupation: retired  Scientist, product/process development strain: Not on file   Food insecurity:    Worry: Not on file    Inability: Not on file   Transportation needs:    Medical: Not on file    Non-medical: Not on file  Tobacco Use   Smoking status: Former Smoker    Packs/day: 0.50    Years: 25.00    Pack years: 12.50    Types: Cigarettes    Last attempt to quit: 08/13/1984    Years since quitting: 34.3   Smokeless tobacco: Never Used  Substance and Sexual Activity   Alcohol use: No   Drug use: No   Sexual activity: Not on file  Lifestyle   Physical activity:    Days per week: Not on file    Minutes per session: Not on file   Stress: Not on file  Relationships   Social connections:    Talks on phone: Not on file    Gets together: Not on file    Attends religious service: Not on file    Active member of club or organization: Not on file    Attends meetings of clubs or organizations: Not on  file    Relationship status: Not on file   Intimate partner violence:    Fear of current or ex partner: Not on file    Emotionally abused: Not on file    Physically abused: Not on file    Forced sexual activity: Not on file  Other Topics  Concern   Not on file  Social History Narrative   Not on file    Socially, she is widowed, has 3 children, 8 grandchildren 5 great-grandchildren.  There is a remote tobacco history but she quit many years ago.  Family History  Problem Relation Age of Onset   Lung cancer Mother    Suicidality Father    Cancer Sister    Breast cancer Daughter    Stroke Sister    ROS General: Negative; No fevers, chills, or night sweats; positive fatigue HEENT: Negative; No changes in vision or hearing, sinus congestion, difficulty swallowing Pulmonary: Negative; No cough, wheezing, shortness of breath, hemoptysis Cardiovascular: See history of present illness Positive for mild shortness of breath with activity, unchanged.;  Admits to ankle edema GI: Negative; No nausea, vomiting, diarrhea, or abdominal pain GU: Negative; No dysuria, hematuria, or difficulty voiding Musculoskeletal: Negative; no myalgias, joint pain, or weakness Hematologic/Oncology: Negative; no easy bruising, bleeding Endocrine: Negative; no heat/cold intolerance; no diabetes Neuro: Negative; no changes in balance, headaches Skin: Negative; No rashes or skin lesions Psychiatric: Negative; No behavioral problems, depression Sleep: Positive for complex sleep apnea, on therapy; No snoring, daytime sleepiness, hypersomnolence, bruxism, restless legs, hypnogognic hallucinations, no cataplexy Other comprehensive 14 point system review is negative.   PE BP (!) 168/72    Pulse 94    Resp 12    Ht '4\' 11"'  (1.499 m)    Wt 154 lb 12.8 oz (70.2 kg)    SpO2 95%    BMI 31.27 kg/m    Pete blood pressure by me was 148/78 and her pulse was in the 90s.  Wt Readings from Last 3 Encounters:  12/03/18 154 lb 12.8 oz (70.2 kg)  11/05/18 153 lb 9.6 oz (69.7 kg)  10/08/18 156 lb 6.4 oz (70.9 kg)   General: Alert, oriented, no distress.  Skin: normal turgor, no rashes, warm and dry HEENT: Normocephalic, atraumatic. Pupils equal round and  reactive to light; sclera anicteric; extraocular muscles intact;  Nose without nasal septal hypertrophy Mouth/Parynx benign; Mallinpatti scale 3 Neck: No JVD, no carotid bruits; normal carotid upstroke Lungs: clear to ausculatation and percussion; no wheezing or rales Chest wall: without tenderness to palpitation Heart: PMI not displaced, RRR, no ectopy on auscultation; S1 s2 normal, 1/6 systolic murmur, no diastolic murmur, no rubs, gallops, thrills, or heaves Abdomen: soft, nontender; no hepatosplenomehaly, BS+; abdominal aorta nontender and not dilated by palpation. Back: no CVA tenderness Pulses 2+ Musculoskeletal: full range of motion, normal strength, no joint deformities Extremities: no clubbing cyanosis or edema, Homan's sign negative  Neurologic: grossly nonfocal; Cranial nerves grossly wnl Psychologic: Normal mood and affect   ECG (independently read by me): Sinus rhythm at 86 bpm.  Poor anterior R wave progression V1 through V3.  No ectopy.  Normal intervals.  November 2019 ECG (independently read by me): Sinus rhythm at 72 bpm.  Poor anterior R wave progression.  Normal intervals.  No ectopy.  November 2018 ECG (independently read by me): Sinus rhythm at 68 bpm with an isolated PVC, nonspecific T changes.  QTc interval 459 ms.  September 2018 ECG (independently read by me): Normal sinus rhythm at 76  bpm.  Nonspecific T-wave changes.  March 2018  ECG (independently read by me): Normal sinus rhythm at 68 bpm.  Normal intervals.  Poor R wave progression anteriorly.  October 2017 ECG (independently read by me): Normal sinus rhythm at 66 bpm.  Poor R wave progression.  Normal intervals.  October 2016 ECG (independently read by me): Normal sinus rhythm at 66 bpm.  Nonspecific T changes.  September 2015 ECG (independently read by me): Normal sinus rhythm at 62 beats per minute.  Poor progression anteriorly.  Nonspecific ST changes  12/03/2013 ECG (independently read by me):  Normal sinus rhythm at 62 beats per minute.  Nonspecific ST changes.  No ectopy  LABS:  BMP Latest Ref Rng & Units 09/22/2018 09/02/2017 07/26/2017  Glucose 65 - 99 mg/dL 87 96 114(H)  BUN 8 - 27 mg/dL 15 22 21(H)  Creatinine 0.57 - 1.00 mg/dL 0.99 1.40(H) 1.00  BUN/Creat Ratio 12 - '28 15 16 ' -  Sodium 134 - 144 mmol/L 140 138 137  Potassium 3.5 - 5.2 mmol/L 4.9 4.2 3.5  Chloride 96 - 106 mmol/L 102 96 97(L)  CO2 20 - 29 mmol/L 24 23 -  Calcium 8.7 - 10.3 mg/dL 9.4 9.2 -   Hepatic Function Latest Ref Rng & Units 09/22/2018 07/26/2017 05/03/2017  Total Protein 6.0 - 8.5 g/dL 6.8 7.5 7.1  Albumin 3.6 - 4.6 g/dL 3.9 3.5 4.1  AST 0 - 40 IU/L '14 19 17  ' ALT 0 - 32 IU/L '17 17 13  ' Alk Phosphatase 39 - 117 IU/L 83 68 75  Total Bilirubin 0.0 - 1.2 mg/dL 0.3 0.6 0.4   CBC Latest Ref Rng & Units 09/22/2018 07/26/2017 07/26/2017  WBC 3.4 - 10.8 x10E3/uL 8.8 - 9.6  Hemoglobin 11.1 - 15.9 g/dL 13.9 15.6(H) 14.8  Hematocrit 34.0 - 46.6 % 41.5 46.0 42.1  Platelets 150 - 450 x10E3/uL 323 - 302   Lab Results  Component Value Date   MCV 87 09/22/2018   MCV 88.1 07/26/2017   MCV 88 05/03/2017   Lab Results  Component Value Date   TSH 1.260 09/22/2018   No results found for: HGBA1C  Lipid Panel     Component Value Date/Time   CHOL 214 (H) 05/03/2017 1229   TRIG 97 05/03/2017 1229   HDL 63 05/03/2017 1229   CHOLHDL 3.4 05/03/2017 1229   CHOLHDL 2.9 01/16/2017 0817   VLDL 31 (H) 01/16/2017 0817   LDLCALC 132 (H) 05/03/2017 1229    RADIOLOGY: No results found.  IMPRESSION:  1. Palpitations   2. Essential hypertension   3. Mild CAD   4. Grade I diastolic dysfunction   5. Complex sleep apnea syndrome   6. Hyperlipidemia, unspecified hyperlipidemia type   7. Dizziness   8. Fatigue, unspecified type     ASSESSMENT AND PLAN: Ms. Seleen Walter is a very pleasant young appearing 85 year old female who has a long-standing history of hypertension and has complex sleep apnea on BiPAP therapy.   Her blood pressure today is elevated on amlodipine 10 mg.  Remotely she had been on ARB therapy but ultimately was switched due to valsartan impurity.  She had also been on HCTZ but is no longer on this.  Her edema has resolved from her last office visit.  She has a history of stage III chronic kidney disease but renal function has continuously improved.  She also has recently experienced some recurrent sensation of chest fluttering.  With her mild blood pressure lability as well as her  episodic palpitations, I have suggested the addition of very low-dose Toprol-XL 12.5 mg to take at bedtime.  Her last echo Doppler study showed normal systolic function with mild grade 1 diastolic dysfunction.  This may also be contributing somewhat to her mild shortness of breath.  She continues to be on BiPAP for complex sleep apnea and admits to 1% compliance.  However, she is not sleeping for adequate sleep duration and typically may only be sleeping 5 hours per night.  I discussed the importance of optimal sleep duration at 8 hours and I suspect this is also playing a major role in her fatigability.  He continues to tolerate rosuvastatin 5 mg every other day for hyperlipidemia as well as omega-3 fatty acid..  It does not appear that she  had a recent lipid profile done and this should be rechecked at her next lab draw.  She has a virtual office visit set up with [name] in 1 month.  I will see her in 6 months for reevaluation.   Time spent; 30 minutes  Troy Sine, MD, [facility]  12/03/2018 6:21 PM

## 2018-12-03 NOTE — Patient Instructions (Signed)
Medication Instructions:  Start Metoprolol XL 25 mg (take 0.5 tablet at bedtime) If you need a refill on your cardiac medications before your next appointment, please call your pharmacy.    Follow-Up: At [facility], you and your health needs are our priority.  As part of our continuing mission to provide you with exceptional heart care, we have created designated Provider Care Teams.  These Care Teams include your primary Cardiologist (physician) and Advanced Practice Providers (APPs -  Physician Assistants and Nurse Practitioners) who all work together to provide you with the care you need, when you need it. You will need a follow up appointment in 4 months.  Please call our office 2 months in advance to schedule this appointment.  You may see Shelva Majestic, MD or one of the following Advanced Practice Providers on your designated Care Team: [place], [place] . Fabian Sharp, PA-C

## 2018-12-04 ENCOUNTER — Telehealth: Payer: Self-pay | Admitting: Cardiovascular Disease

## 2018-12-04 NOTE — Telephone Encounter (Signed)
Spoke with pt, the medication list with her AVS from yesterdays visit has take 5 mg of amlodipine as needed for bp above 180. He did not mention that to her and it is not in his note, she wants to make sure she is supposed to do that. Will forward to julie, dr [facility] nurse to verify.

## 2018-12-04 NOTE — Telephone Encounter (Signed)
° ° °  Pt c/o medication issue:  1. Name of Medication: amLODipine (NORVASC)   2. How are you currently taking this medication (dosage and times per day)? n/a  3. Are you having a reaction (difficulty breathing--STAT)? no  4. What is your medication issue? Patient calling to clarify dosage

## 2018-12-04 NOTE — Telephone Encounter (Signed)
Called spoke with patient, the amlodipine 10 mg is be continued via visit with Dr.Kelly yesterday, along with the added Metoprolol. The 5 mg Amlodipine on the list was from NP and was advised to take AS NEEDED if BP were get up. I advised patient that the new medication along with the amlodipine would help the BP, and to call before taking the as needed if it ever came to that.  Patient verbalized understanding, and did remember this being said.

## 2018-12-26 ENCOUNTER — Telehealth: Payer: Self-pay | Admitting: Adult Health

## 2018-12-26 NOTE — Telephone Encounter (Signed)
Mychart pending, home phone only, consent (verbal), pre reg complete 12/26/18 AF

## 2018-12-27 NOTE — Progress Notes (Signed)
Virtual Visit via Telephone Note   This visit type was conducted due to national recommendations for restrictions regarding the COVID-19 Pandemic (e.g. social distancing) in an effort to limit this patient's exposure and mitigate transmission in our community.  Due to her co-morbid illnesses, this patient is at least at moderate risk for complications without adequate follow up.  This format is felt to be most appropriate for this patient at this time.  The patient did not have access to video technology/had technical difficulties with video requiring transitioning to audio format only (telephone).  All issues noted in this document were discussed and addressed.  No physical exam could be performed with this format.  Please refer to the patient's chart for her  consent to telehealth for [facility]. Patient has given verbal permission to conduct this visit via virtual appointment and to bill insurance 12/29/2018 9:45 AM     Date:  12/29/2018   ID:  Jo Hendrix, DOB 1926-09-04, MRN [id number]  Patient Location: Home Provider Location: Home  PCP:  Jonathon Jordan, MD  Cardiologist:  Shelva Majestic, MD  Electrophysiologist:  None   Evaluation Performed:  Follow-Up Visit  Chief Complaint: Follow-up  History of Present Illness:    Jo Hendrix is a 85 y.o. female for ongoing assessment and management of hypertension, grade 1 diastolic dysfunction, pulmonary hypertension with complex sleep apnea on BiPAP, mild coronary artery disease per cardiac catheterization in 2008, follow-up nuclear medicine stress test in 2016 which was low risk, echocardiogram revealing EF of 60-65% and February 2020.Marland Kitchen  She also has had history of palpitations and dizziness and did wear a monitor in 2018 for a week which did not reveal any significant abnormalities she remained in normal sinus rhythm with isolated unifocal PVCs.   She saw Dr. Claiborne Billings on 12/03/2018 at which time she was hypertensive.  She was started on  metoprolol XL 12.5 mg at at bedtime.  She was to continue amlodipine 5 mg daily and losartan 25 mg daily, she was to continue on BiPAP.  She was to continue rosuvastatin 10 mg every other day as well as omega-3 fatty acid.  She will need to have a follow-up lipid LFTs completed.  She was to be seen in 6 months  She states that she feels very tired and cannot get out of bed sometimes.She states that the metoprolol is doing well, and fatigue is not any worse since she started taking it. She is not very active.  She continues to be medically compliant. She does have some nausea when she takes metoprolol at night and wants to take it with her dinnertime meal in the evenings instead.   She is compliant with BiPAP but continues to have trouble sleeping at night with fatigue during the day. She has not had her BiPAP machine checked or had calibration or cleaning since she received it.   The patient does not have symptoms concerning for COVID-19 infection (fever, chills, cough, or new shortness of breath).    Past Medical History:  Diagnosis Date  . ACE-inhibitor cough   . COPD (chronic obstructive pulmonary disease) ([place])   . Diastolic dysfunction   . History of tobacco use    smoked 25 years  . Hypertension   . Mild CAD   . Mild pulmonary hypertension ([place])   . OSA (obstructive sleep apnea)    Sleep Study 04/07/2007 - AHI during total sleep 21.30/hr, during REM 20.16/hr - BiPAP auto servo-ventilation unit   Past Surgical  History:  Procedure Laterality Date  . benign lesion removed on lung  2000  . BLADDER REPAIR    . CARDIAC CATHETERIZATION  05/20/2007   mild coronary obstructive disease with 20% narrowing in prox LAD, diffuse luminal irregularity of 40-50% in mid RCA (Dr. Corky Downs)  . Cardiopulmonary Met Test  01/28/2012   with PFTs - FEV1 & FEV1/VC WNL, VC WNL, DLCO WNL; abnormal pulmonary response  . Carotid Doppler  04/2011   normal patency  . CARPAL TUNNEL RELEASE     x2  . CATARACT  EXTRACTION, BILATERAL    . GALLBLADDER SURGERY    . NM MYOCAR PERF WALL MOTION  09/2011   lexiscan myoview - normal perfusion, EF 77%, low risk  . Renal Doppler  05/2007   normal renal arteries   . TONSILLECTOMY    . TRANSTHORACIC ECHOCARDIOGRAM  2013   EF 50-55%; mild MR; mild TR; mild pulm valve regurg; aortic root sclerosis/calcification  . VAGINAL HYSTERECTOMY    . VARICOSE VEIN SURGERY       Current Meds  Medication Sig  . amLODipine (NORVASC) 10 MG tablet Take 1 tablet (10 mg total) by mouth daily.  Marland Kitchen aspirin 81 MG tablet Take 81 mg by mouth daily.  . Calcium 1500 MG tablet Take 1,500 mg by mouth daily.  . cholecalciferol (VITAMIN D) 1000 UNITS tablet Take 1 tablet by mouth daily. Take 1 tab daily  . losartan (COZAAR) 25 MG tablet Take 1 tablet by mouth once daily  . metoprolol succinate (TOPROL-XL) 25 MG 24 hr tablet Take 0.5 tablets (12.5 mg total) by mouth at bedtime.  . NON FORMULARY at bedtime. BiPAP  . Omega-3 Fatty Acids (FISH OIL) 1000 MG CAPS Take 1 capsule by mouth 3 (three) times daily.  . RESTASIS 0.05 % ophthalmic emulsion Place 1 drop into both eyes 2 (two) times daily. Use 1 drop in each eye twice a day  . rOPINIRole (REQUIP) 0.25 MG tablet Take 0.25 mg by mouth as needed.  . rosuvastatin (CRESTOR) 5 MG tablet TAKE 1 TABLET EVERY OTHER DAY     Allergies:   Acetaminophen; Zetia [ezetimibe]; Gabapentin; Hydrochlorothiazide; Ace inhibitors; Codeine; Erythromycin; Etodolac; and Ivp dye [iodinated diagnostic agents]   Social History   Tobacco Use  . Smoking status: Former Smoker    Packs/day: 0.50    Years: 25.00    Pack years: 12.50    Types: Cigarettes    Last attempt to quit: 08/13/1984    Years since quitting: 34.4  . Smokeless tobacco: Never Used  Substance Use Topics  . Alcohol use: No  . Drug use: No     Family Hx: The patient's family history includes Breast cancer in her daughter; Cancer in her sister; Lung cancer in her mother; Stroke in her  sister; Suicidality in her father.  ROS:   Please see the history of present illness.     All other systems reviewed and are negative.   Prior CV studies:   The following studies were reviewed today: Echocardiogram 10/02/2018 1. The left ventricle has normal systolic function with an ejection fraction of 60-65%. The cavity size was normal. Left ventricular diastolic Doppler parameters are consistent with impaired relaxation.  2. The right ventricle has normal systolic function. The cavity was normal. There is no increase in right ventricular wall thickness.  3. The mitral valve is normal in structure.  4. The tricuspid valve is normal in structure.  5. The aortic valve is normal in structure.  6. The pulmonic valve was grossly normal. Pulmonic valve regurgitation is mild by color flow Doppler.  7. The aortic root and ascending aorta are normal in size and structure.  8. The interatrial septum was not assessed.  Labs/Other Tests and Data Reviewed:    EKG:    Recent Labs: 09/22/2018: ALT 17; BUN 15; Creatinine, Ser 0.99; Hemoglobin 13.9; Platelets 323; Potassium 4.9; Sodium 140; TSH 1.260   Recent Lipid Panel Lab Results  Component Value Date/Time   CHOL 214 (H) 05/03/2017 12:29 PM   TRIG 97 05/03/2017 12:29 PM   HDL 63 05/03/2017 12:29 PM   CHOLHDL 3.4 05/03/2017 12:29 PM   CHOLHDL 2.9 01/16/2017 08:17 AM   LDLCALC 132 (H) 05/03/2017 12:29 PM    Wt Readings from Last 3 Encounters:  12/29/18 153 lb (69.4 kg)  12/03/18 154 lb 12.8 oz (70.2 kg)  11/05/18 153 lb 9.6 oz (69.7 kg)     Objective:    Vital Signs:  BP 129/64   Pulse 73   Ht '4\' 11"'  (1.499 m)   Wt 153 lb (69.4 kg)   BMI 30.90 kg/m    VITAL SIGNS:  reviewed Assessment limited due to telephone visit She did not appear dyspneic on conversatation She was awake, alert and oriented. No acute distress She was able to ask and answer questions appropriately.  ASSESSMENT & PLAN:    1. Hypertension: Blood pressure  is well controlled on metoprolol as well as on amlodipine. She is tolerating medications with the exception of mild nausea when she takes the metoprolol on empty stomach. She can take it at her evening meal if this helps her with the nausea. I do not think BB is has worsened her fatigue as she states she cannot tell the difference taking the metoprolol. She is not active. This may be related to deconditioning. Follow up with PCP for TSH and other testing  2. OSA: She has a BiPAP machine which she reports that she is compliant with, but still is very tired in the am. I have asked Mariann Laster, the OSA nurse with Dr. Claiborne Billings to call her about having the machine checked and cleaned by the distributor. She will follow up.   3. Hypercholesterolemia; Continue statin and Omega 3 Fish Oil. She is followed by PCP for labs.    COVID-19 Education: The signs and symptoms of COVID-19 were discussed with the patient and how to seek care for testing (follow up with PCP or arrange E-visit). The importance of social distancing was discussed today.  Time:   Today, I have spent 12 minutes with the patient with telehealth technology discussing the above problems.     Medication Adjustments/Labs and Tests Ordered: Current medicines are reviewed at length with the patient today.  Concerns regarding medicines are outlined above.   Tests Ordered: No orders of the defined types were placed in this encounter.   Medication Changes: No orders of the defined types were placed in this encounter.   Disposition:  Follow up 6 months with Dr. Claiborne Billings   Signed, Phill Myron. [place] Pugh, ANP, AACC  12/29/2018 9:45 AM    [facility]

## 2018-12-29 ENCOUNTER — Telehealth (INDEPENDENT_AMBULATORY_CARE_PROVIDER_SITE_OTHER): Payer: Medicare HMO | Admitting: Adult Health

## 2018-12-29 ENCOUNTER — Encounter: Payer: Self-pay | Admitting: Adult Health

## 2018-12-29 VITALS — BP 129/64 | HR 73 | Ht 59.0 in | Wt 153.0 lb

## 2018-12-29 DIAGNOSIS — I10 Essential (primary) hypertension: Secondary | ICD-10-CM

## 2018-12-29 DIAGNOSIS — I5032 Chronic diastolic (congestive) heart failure: Secondary | ICD-10-CM

## 2018-12-29 DIAGNOSIS — G4733 Obstructive sleep apnea (adult) (pediatric): Secondary | ICD-10-CM

## 2018-12-29 DIAGNOSIS — E785 Hyperlipidemia, unspecified: Secondary | ICD-10-CM

## 2018-12-29 NOTE — Patient Instructions (Signed)
Medication Instructions:  Your physician recommends that you continue on your current medications as directed. Please refer to the Current Medication list given to you today.  If you need a refill on your cardiac medications before your next appointment, please call your pharmacy.    Follow-Up: At [facility], you and your health needs are our priority.  As part of our continuing mission to provide you with exceptional heart care, we have created designated Provider Care Teams.  These Care Teams include your primary Cardiologist (physician) and Advanced Practice Providers (APPs -  Physician Assistants and Nurse Practitioners) who all work together to provide you with the care you need, when you need it. You will need a follow up appointment in 6 months.  Please call our office 2 months in advance to schedule this appointment.  You may see Shelva Majestic, MD or one of the following Advanced Practice Providers on your designated Care Team: [place], [place] . Fabian Sharp, PA-C  Any Other Special Instructions Will Be Listed Below (If Applicable). Mariann Laster will call you about your BiPAP machine. I have sent her a message.

## 2019-01-01 ENCOUNTER — Other Ambulatory Visit: Payer: Self-pay

## 2019-01-01 ENCOUNTER — Telehealth: Payer: Self-pay | Admitting: Cardiovascular Disease

## 2019-01-01 MED ORDER — LOSARTAN POTASSIUM 25 MG PO TABS: 25.0000 mg | ORAL_TABLET | Freq: Every day | ORAL | 2 refills | Status: DC

## 2019-01-01 MED ORDER — METOPROLOL SUCCINATE ER 25 MG PO TB24: 12.5000 mg | ORAL_TABLET | Freq: Every day | ORAL | 1 refills | Status: DC

## 2019-01-01 NOTE — Telephone Encounter (Signed)
New Message:    Please call, she needs to talk to you about her C-Pap machine.

## 2019-01-12 ENCOUNTER — Telehealth: Payer: Self-pay | Admitting: Cardiovascular Disease

## 2019-01-12 NOTE — Telephone Encounter (Signed)
° ° °  Pt c/o medication issue:  1. Name of Medication: metoprolol succinate (TOPROL-XL) 25 MG 24 hr tablet  2. How are you currently taking this medication (dosage and times per day)? As written  3. Are you having a reaction (difficulty breathing--STAT)? no  4. What is your medication issue? Nausea after taking

## 2019-01-12 NOTE — Telephone Encounter (Signed)
Spoke with pt, she has tried to take the metoprolol with food and it has not helped with her nausea. She reports her bp is good but the metoprolol is not comfortable for her to take. Will forward to dr Claiborne Billings to review and advise.

## 2019-01-12 NOTE — Telephone Encounter (Signed)
Try bystolic 2.5 mg or consider bisoprolol 2.5 mg to start in place of toprol

## 2019-01-13 MED ORDER — NEBIVOLOL HCL 2.5 MG PO TABS: 2.5000 mg | ORAL_TABLET | Freq: Every day | ORAL | 1 refills | Status: DC

## 2019-01-13 NOTE — Telephone Encounter (Signed)
Called patient, notified of change to Bystolic 2.5 mg- patient verified pharmacy, changed medication list- patient will call if any issues.

## 2019-01-26 ENCOUNTER — Telehealth: Payer: Self-pay | Admitting: Cardiovascular Disease

## 2019-01-26 NOTE — Telephone Encounter (Signed)
New Message    Pt c/o medication issue:  1. Name of Medication: Bystolic   2. How are you currently taking this medication (dosage and times per day)? 1x daily 2.5mg    3. Are you having a reaction (difficulty breathing--STAT)? Yes   4. What is your medication issue? Pt says she is very weak and tired. She says she feels like her whole body is trembling

## 2019-01-26 NOTE — Telephone Encounter (Signed)
Spoke with patient and she stated she has been feeling weak, nauseated, and tired since starting the Bystolic. She was having this with the Metoprolol as well. Blood pressure running around 112/60 HR 68. She does not check her blood pressure mid day to see what numbers are then, readings in am. Will forward to Dr Claiborne Billings for review

## 2019-01-26 NOTE — Telephone Encounter (Signed)
All signs are excellent.  Remotely she has had issues with blood pressure elevation.  Try cutting the 2.5 pill in half if at all possible to see if she notes some improvement

## 2019-01-26 NOTE — Telephone Encounter (Signed)
She takes Bystolic in the evening already. Will forward to Dr Claiborne Billings for review

## 2019-01-26 NOTE — Telephone Encounter (Signed)
VS are stable; try taking at bedtime

## 2019-01-27 NOTE — Telephone Encounter (Signed)
Called patient- advised of message from Dr.Kelly to cut Bystolic 2.5 mg in half- patient states she will try this to see if she feels better, also advised to monitor BP and to call if any issues with that. Patient verbalized understanding.

## 2019-01-30 ENCOUNTER — Telehealth: Payer: Self-pay | Admitting: Cardiovascular Disease

## 2019-01-30 NOTE — Telephone Encounter (Signed)
New Message   Pt c/o medication issue:  1. Name of Medication: nebivolol (BYSTOLIC) 2.5 MG tablet   2. How are you currently taking this medication (dosage and times per day)?   3. Are you having a reaction (difficulty breathing--STAT)?   4. What is your medication issue? Cough, dull ache and nauseated. Overrall just does not feel well.

## 2019-01-30 NOTE — Telephone Encounter (Signed)
Ok to [employer]

## 2019-01-30 NOTE — Telephone Encounter (Signed)
The patient stated that she has been taking a half tablet of the Bystolic 2.5 mg since the 15th. She currently takes this in the evening. She is still having nausea, coughs and an overall sick feeling.   Her blood pressure this morning was 107/56 and heart rate was 71. She stated that she does not want to try anymore beta blockers at this time.

## 2019-01-30 NOTE — Telephone Encounter (Signed)
The patient has been called and made aware to stop the bystolic. She will continue to monitor her blood pressure and heart rate and call back if anything further is needed.

## 2019-02-17 ENCOUNTER — Telehealth: Payer: Self-pay | Admitting: Cardiovascular Disease

## 2019-02-17 NOTE — Telephone Encounter (Signed)
° °  Pt c/o medication issue:  1. Name of Medication: amLODipine (NORVASC) 10 MG tablet  2. How are you currently taking this medication (dosage and times per day)? 10 mg   3. Are you having a reaction (difficulty breathing--STAT)? No   4. What is your medication issue?  Patient states the dosage on the recent bottle of medication she received is different than what she had been taking in the past. She has been taking 5 mg 2x daily, but the new dosage states that she should take 5mg  1x daily.  Please call to clarify exactly how much the patient should be taking

## 2019-02-17 NOTE — Telephone Encounter (Signed)
Pt medications were reviewed extensively, pt is on Norvasc 10 mg and losartan 25 mg. Pt is feeling well on medications but received a script refill via [facility] mail [facility] and it was stated differently than she is used to but verbalized understanding of her doses.

## 2019-03-27 ENCOUNTER — Telehealth: Payer: Self-pay | Admitting: Cardiovascular Disease

## 2019-03-27 ENCOUNTER — Other Ambulatory Visit: Payer: Self-pay | Admitting: Cardiovascular Disease

## 2019-03-27 MED ORDER — AMLODIPINE BESYLATE 10 MG PO TABS: 10.0000 mg | ORAL_TABLET | Freq: Every day | ORAL | 1 refills | Status: DC

## 2019-03-27 NOTE — Telephone Encounter (Signed)
Let patient know that I refilled her Amlodipine and sent a 90 day supply to [place].

## 2019-03-27 NOTE — Telephone Encounter (Signed)
°*  STAT* If patient is at the pharmacy, call can be transferred to refill team.   1. Which medications need to be refilled? (please list name of each medication and dose if known) Amlodipine 10 mg  2. Which pharmacy/location (including street and city if local pharmacy) is medication to be sent to? [place]   3. Do they need a 30 day or 90 day supply? [place]

## 2019-03-27 NOTE — Telephone Encounter (Signed)
New message:    Patient calling concering her CPAP patient states it need to be check.

## 2019-04-01 NOTE — Telephone Encounter (Signed)
Follow-up:  Patient was told she would receive a phone call from [place] about her CPAP machine, but she has not heard anything yet.  She just wants to know what to do to get her CPAP checked. Please advise

## 2019-04-03 NOTE — Telephone Encounter (Signed)
Pt notified that sleep is not in today-should be back Monday. Pt expressed thanks and is happy to wait till next week

## 2019-04-03 NOTE — Telephone Encounter (Signed)
Per pt call following up on an answer to her question.   Pt stated thanks.

## 2019-04-09 NOTE — Telephone Encounter (Signed)
Follow up    Patient is following up on her CPAP

## 2019-04-10 ENCOUNTER — Telehealth: Payer: Self-pay | Admitting: *Deleted

## 2019-04-10 NOTE — Telephone Encounter (Signed)
Invalid phone number.

## 2019-04-10 NOTE — Telephone Encounter (Signed)
Attempted to call the patient multiple times today at the number in the chart and it kept going to a business VM. Attempted to contact both emergency #'s and they were invalid as well. I will attempt to try again later just in case there's a abnormality in the phone lines.

## 2019-04-16 NOTE — Telephone Encounter (Signed)
Follow up   Patient states that the phone number on file is now working. Please call the patient about her cpap machine.

## 2019-04-21 ENCOUNTER — Encounter: Payer: Self-pay | Admitting: Cardiovascular Disease

## 2019-04-22 NOTE — Telephone Encounter (Signed)
Returned a call to patient. She states that she is having problems sleeping and wonders if her CPAP machine is working properly. I told her we will first need to get a download from [employer] in order to see if the current pressure settings are appropriate. I called [employer] to order a download.

## 2019-04-22 NOTE — Telephone Encounter (Signed)
Jo Hendrix with [employer] states she will call the patient today to arrange getting her CPAP chip to download. She will get a 30 and 90 day download.

## 2019-04-28 ENCOUNTER — Telehealth: Payer: Self-pay | Admitting: Cardiovascular Disease

## 2019-04-28 NOTE — Telephone Encounter (Signed)
Pt c/o medication issue:  1. Name of Medication: amLODipine (NORVASC) 10 MG tablet  2. How are you currently taking this medication (dosage and times per day)? Take 1 tablet (10 mg total) by mouth daily.  3. Are you having a reaction (difficulty breathing--STAT)? No   4. What is your medication issue? Patient is feeling very weak and tired, she can't sleep. SOB, but she has always have had that.

## 2019-04-28 NOTE — Telephone Encounter (Signed)
Spoke to pt about concerns with feeling tired, weak, fatigued and not sleeping. She states that she is chronically SOB. Asked the pt how long she has had these symptoms, she stated they have gotten worse over the past month. Asked the pt if she felt that it started when she started taking her amlodipine. The pt was advised to follow up with her PCP but requested that Dr. Evette Georges advice.

## 2019-05-03 NOTE — Telephone Encounter (Signed)
The patient has had chronic issues with fatigability.  I doubt this is due to her amlodipine.  She is on very low-dose beta-blocker therapy

## 2019-05-04 NOTE — Telephone Encounter (Signed)
Pt notified she states that she has an appt with PCP she will discuss with her

## 2019-05-04 NOTE — Telephone Encounter (Signed)
Follow up    Patient is returning call in reference to Dr. Evette Georges suggestion for her fatigue and shortness of breath. Please call.

## 2019-05-08 ENCOUNTER — Other Ambulatory Visit: Payer: Self-pay | Admitting: Cardiovascular Disease

## 2019-05-12 ENCOUNTER — Telehealth: Payer: Self-pay | Admitting: Cardiovascular Disease

## 2019-05-12 NOTE — Telephone Encounter (Signed)
Patient called and wanted to speak with Mariann Laster in the sleep study clinic. She had a question regarding her cpap machine.

## 2019-05-13 NOTE — Telephone Encounter (Signed)
Patient called again this morning asking to speak with Mariann Laster about her CPAP machine. She states that her PCP wants her to do another sleep study, and she would like to get the test set up as soon as possible. Please contact the patient

## 2019-05-13 NOTE — Telephone Encounter (Signed)
Returned a call to patient. She states that she is still having problems sleeping. Her PCP recommends her having another sleep study. I told her I will check to see if her recent download has been sent to Dr Claiborne Billings. If so, her machine pressures may need changed. If not.  We will proceed with recommendations. Patient agrees with this plan.

## 2019-05-14 ENCOUNTER — Telehealth: Payer: Self-pay | Admitting: *Deleted

## 2019-05-14 NOTE — Telephone Encounter (Signed)
Called patient and informed her that I requested for [employer] to resend her download to me. It has been given to Dr Claiborne Billings for review and recommendations. If her download is normal we will make appointment to come in and discuss her symptoms with him. Patient agrees with this plan.

## 2019-05-15 ENCOUNTER — Telehealth: Payer: Self-pay | Admitting: *Deleted

## 2019-05-15 NOTE — Telephone Encounter (Signed)
Informed patient per Dr Claiborne Billings her recent download has been reviewed and is normal. Her symptoms of waking up though out the night, does not seem to be related to her sleep apnea. Add on appointment scheduled for evaluation 07/03/19 @ 10:00 am. In the meantime the patient will consult with her PCP.

## 2019-05-21 ENCOUNTER — Encounter: Payer: Self-pay | Admitting: Pulmonary Disease

## 2019-05-21 ENCOUNTER — Other Ambulatory Visit: Payer: Self-pay

## 2019-05-21 ENCOUNTER — Ambulatory Visit: Payer: Medicare HMO | Admitting: Pulmonary Disease

## 2019-05-21 VITALS — BP 138/60 | HR 107 | Ht 59.0 in | Wt 150.4 lb

## 2019-05-21 DIAGNOSIS — F5104 Psychophysiologic insomnia: Secondary | ICD-10-CM

## 2019-05-21 DIAGNOSIS — G4733 Obstructive sleep apnea (adult) (pediatric): Secondary | ICD-10-CM

## 2019-05-21 DIAGNOSIS — Z9989 Dependence on other enabling machines and devices: Secondary | ICD-10-CM | POA: Diagnosis not present

## 2019-05-21 MED ORDER — ZOLPIDEM TARTRATE ER 6.25 MG PO TBCR: 6.2500 mg | EXTENDED_RELEASE_TABLET | Freq: Every evening | ORAL | 0 refills | Status: DC | PRN

## 2019-05-21 MED ORDER — ROPINIROLE HCL 0.5 MG PO TABS: 0.5000 mg | ORAL_TABLET | Freq: Every day | ORAL | 1 refills | Status: DC

## 2019-05-21 NOTE — Patient Instructions (Addendum)
Difficulty staying asleep  Some of this may be related to your restless legs  We will increase Requip to 0.5 nightly  If after being on Requip 0.5 for few nights and you are still noticing that you are not able to sleep well You may start the low-dose of Ambien -Do not wake up in the middle of the night and use Lunesta as it will stay in your system through the morning hours-you want to be a short of about 6 hours of sleep time if you are using any sleep aid  I will see you back in the office in about 3 to 4 weeks  Stop taking the Ambien or the increased dose of Requip if you are waking up groggy in the morning after taking any of the medications

## 2019-05-21 NOTE — Progress Notes (Signed)
Subjective:     Patient ID: Jo Hendrix, female   DOB: 10-Aug-1927, 85 y.o.   MRN: [id number]  Patient being seen for nonrestorative sleep  Multiple awakenings at night after about 1 hour to 1 on the off hours of sleep  Does have a history of restless legs-only using Requip as needed History of obstructive sleep apnea for which she is on auto titrating CPAP-compliant with treatment  Has no difficulty falling asleep, she wakes up after about an hour to an hour and a half, takes a while to fall back asleep She usually will get a little bit restless We did talk about possibility of a restless legs may be contributing to the awakenings  She wakes up tired, she feels her sleep is nonrestorative Claims she is not very sleepy during the day  She denies having any significant pain or discomfort that may be contributing to nightly awakenings  Past Medical History:  Diagnosis Date  . ACE-inhibitor cough   . COPD (chronic obstructive pulmonary disease) ([place])   . Diastolic dysfunction   . History of tobacco use    smoked 25 years  . Hypertension   . Mild CAD   . Mild pulmonary hypertension ([place])   . OSA (obstructive sleep apnea)    Sleep Study 04/07/2007 - AHI during total sleep 21.30/hr, during REM 20.16/hr - BiPAP auto servo-ventilation unit   Social History   Socioeconomic History  . Marital status: Widowed    Spouse name: Not on file  . Number of children: 3  . Years of education: Not on file  . Highest education level: Not on file  Occupational History  . Occupation: retired  Scientific laboratory technician  . Financial resource strain: Not on file  . Food insecurity    Worry: Not on file    Inability: Not on file  . Transportation needs    Medical: Not on file    Non-medical: Not on file  Tobacco Use  . Smoking status: Former Smoker    Packs/day: 0.50    Years: 25.00    Pack years: 12.50    Types: Cigarettes    Quit date: 08/13/1984    Years since quitting: 34.7  . Smokeless tobacco:  Never Used  Substance and Sexual Activity  . Alcohol use: No  . Drug use: No  . Sexual activity: Not on file  Lifestyle  . Physical activity    Days per week: Not on file    Minutes per session: Not on file  . Stress: Not on file  Relationships  . Social Herbalist on phone: Not on file    Gets together: Not on file    Attends religious service: Not on file    Active member of club or organization: Not on file    Attends meetings of clubs or organizations: Not on file    Relationship status: Not on file  . Intimate partner violence    Fear of current or ex partner: Not on file    Emotionally abused: Not on file    Physically abused: Not on file    Forced sexual activity: Not on file  Other Topics Concern  . Not on file  Social History Narrative  . Not on file   Family History  Problem Relation Age of Onset  . Lung cancer Mother   . Suicidality Father   . Cancer Sister   . Breast cancer Daughter   . Stroke Sister  Review of Systems  Constitutional: Negative for fever and unexpected weight change.  HENT: Negative for congestion, dental problem, ear pain, nosebleeds, postnasal drip, rhinorrhea, sinus pressure, sneezing, sore throat and trouble swallowing.   Eyes: Negative for redness and itching.  Respiratory: Positive for shortness of breath. Negative for cough, chest tightness and wheezing.   Cardiovascular: Negative for palpitations and leg swelling.  Gastrointestinal: Negative for nausea and vomiting.  Genitourinary: Negative for dysuria.  Musculoskeletal: Negative for joint swelling.  Skin: Negative for rash.  Allergic/Immunologic: Negative.  Negative for environmental allergies, food allergies and immunocompromised state.  Neurological: Negative for headaches.  Hematological: Does not bruise/bleed easily.  Psychiatric/Behavioral: Negative for dysphoric mood. The patient is not nervous/anxious.        Objective:   Physical  Exam Constitutional:      Appearance: Normal appearance.  HENT:     Head: Normocephalic and atraumatic.  Eyes:     General:        Right eye: No discharge.        Left eye: No discharge.     Pupils: Pupils are equal, round, and reactive to light.  Neck:     Musculoskeletal: Normal range of motion and neck supple. No neck rigidity.  Cardiovascular:     Rate and Rhythm: Normal rate and regular rhythm.     Pulses: Normal pulses.     Heart sounds: No murmur. No friction rub.  Pulmonary:     Effort: Pulmonary effort is normal. No respiratory distress.     Breath sounds: Normal breath sounds. No stridor. No wheezing or rhonchi.  Abdominal:     General: There is no distension.  Musculoskeletal: Normal range of motion.        General: No swelling.  Skin:    General: Skin is warm.     Coloration: Skin is not jaundiced or pale.  Neurological:     General: No focal deficit present.     Mental Status: She is alert.     Cranial Nerves: No cranial nerve deficit.  Psychiatric:        Mood and Affect: Mood normal.       Assessment:     .  Sleep maintenance insomnia  .  Obstructive sleep apnea -Compliant with CPAP use -Feels CPAP continues to work well  .  Restless legs -He is on Requip, has been using it as needed -We did discuss the possibility that restless leg may be contributing to early awakenings and inability to sleep through the night  Sleep is not as consolidated in the elderly -She does appear to have symptoms relating to nonrestorative sleep and fatigue in the mornings    Plan:     Increase Requip to 0.5 nightly  Prescription for Ambien 6.25, only to be used if increasing Requip does not appear to be helping her stay asleep  Encouraged to discontinue use of the medications if she were to be waking up groggy  She has to make sure she has enough hours of sleep  Not to use a sleep aid in the middle of the night  I will see her back in the office in about 3 to 4  weeks to follow-up

## 2019-06-01 ENCOUNTER — Telehealth: Payer: Self-pay | Admitting: Pulmonary Disease

## 2019-06-01 NOTE — Telephone Encounter (Signed)
Called and spoke to patient. Patient stated that her insurance does not cover the Ambien CR but she thinks the regular Ambien might be more affordable. Patient also stated she couldn't take the the Requip due to nausea and constipation.  Patient would like Dr. Judson Roch opinion.   Routing to Dr. Jenetta Downer.

## 2019-06-02 ENCOUNTER — Other Ambulatory Visit: Payer: Self-pay | Admitting: Pulmonary Disease

## 2019-06-02 MED ORDER — ZOLPIDEM TARTRATE 10 MG PO TABS: 5.0000 mg | ORAL_TABLET | Freq: Every evening | ORAL | 3 refills | Status: DC | PRN

## 2019-06-02 NOTE — Telephone Encounter (Signed)
Left message for patient to call back  

## 2019-06-02 NOTE — Telephone Encounter (Signed)
Regular Jo Hendrix will be prescribed  Okay to stop requip if she cant tolerate it

## 2019-06-04 NOTE — Telephone Encounter (Signed)
Pt returning call CB# [phone]

## 2019-06-04 NOTE — Telephone Encounter (Signed)
ATC X 1- someone answered but did not speak  Called back 3 more x and line was busy

## 2019-06-04 NOTE — Telephone Encounter (Signed)
Spoke with the pt  She states that she got a call from [facility] that she had a new rx and so she picked up  Zolpidem 10 mg 0.5 tablet qhs was sent by Dr Ander Slade on 06/02/19 She has tried this 2 nights in a row and said it did nothing to help her sleep  Should we advise her to try whole tab?  Please advise thanks

## 2019-06-04 NOTE — Telephone Encounter (Signed)
Jo Hendrix w/[facility] mail order pharmacy calling in regardings to Zolpidem ER 6.25 mg.  Patient requested new prescription.  Please advise.  [phone].

## 2019-06-04 NOTE — Telephone Encounter (Signed)
Attempted to call patient, no answer, left message to call back.  [place] and verified new Rx.

## 2019-06-08 ENCOUNTER — Emergency Department (HOSPITAL_COMMUNITY): Payer: Medicare HMO

## 2019-06-08 ENCOUNTER — Other Ambulatory Visit: Payer: Self-pay

## 2019-06-08 ENCOUNTER — Inpatient Hospital Stay (HOSPITAL_COMMUNITY)
Admission: EM | Admit: 2019-06-08 | Discharge: 2019-06-12 | DRG: 180 | Disposition: A | Discharge status: Home / Self Care | Payer: Medicare HMO | Attending: Internal Medicine | Admitting: Internal Medicine

## 2019-06-08 ENCOUNTER — Encounter (HOSPITAL_COMMUNITY): Payer: Self-pay | Admitting: Emergency Medicine

## 2019-06-08 DIAGNOSIS — C3411 Malignant neoplasm of upper lobe, right bronchus or lung: Principal | ICD-10-CM | POA: Diagnosis present

## 2019-06-08 DIAGNOSIS — G4731 Primary central sleep apnea: Secondary | ICD-10-CM

## 2019-06-08 DIAGNOSIS — R002 Palpitations: Secondary | ICD-10-CM | POA: Diagnosis not present

## 2019-06-08 DIAGNOSIS — G4733 Obstructive sleep apnea (adult) (pediatric): Secondary | ICD-10-CM | POA: Diagnosis present

## 2019-06-08 DIAGNOSIS — Z66 Do not resuscitate: Secondary | ICD-10-CM | POA: Diagnosis present

## 2019-06-08 DIAGNOSIS — I7389 Other specified peripheral vascular diseases: Secondary | ICD-10-CM | POA: Diagnosis present

## 2019-06-08 DIAGNOSIS — C3491 Malignant neoplasm of unspecified part of right bronchus or lung: Secondary | ICD-10-CM | POA: Diagnosis present

## 2019-06-08 DIAGNOSIS — E785 Hyperlipidemia, unspecified: Secondary | ICD-10-CM | POA: Diagnosis present

## 2019-06-08 DIAGNOSIS — Z886 Allergy status to analgesic agent status: Secondary | ICD-10-CM

## 2019-06-08 DIAGNOSIS — Z801 Family history of malignant neoplasm of trachea, bronchus and lung: Secondary | ICD-10-CM

## 2019-06-08 DIAGNOSIS — I272 Pulmonary hypertension, unspecified: Secondary | ICD-10-CM | POA: Diagnosis present

## 2019-06-08 DIAGNOSIS — I130 Hypertensive heart and chronic kidney disease with heart failure and stage 1 through stage 4 chronic kidney disease, or unspecified chronic kidney disease: Secondary | ICD-10-CM | POA: Diagnosis present

## 2019-06-08 DIAGNOSIS — Z90710 Acquired absence of both cervix and uterus: Secondary | ICD-10-CM

## 2019-06-08 DIAGNOSIS — I10 Essential (primary) hypertension: Secondary | ICD-10-CM | POA: Diagnosis present

## 2019-06-08 DIAGNOSIS — R918 Other nonspecific abnormal finding of lung field: Secondary | ICD-10-CM

## 2019-06-08 DIAGNOSIS — Z7982 Long term (current) use of aspirin: Secondary | ICD-10-CM

## 2019-06-08 DIAGNOSIS — R630 Anorexia: Secondary | ICD-10-CM | POA: Diagnosis present

## 2019-06-08 DIAGNOSIS — Z91041 Radiographic dye allergy status: Secondary | ICD-10-CM

## 2019-06-08 DIAGNOSIS — Z885 Allergy status to narcotic agent status: Secondary | ICD-10-CM

## 2019-06-08 DIAGNOSIS — Z20828 Contact with and (suspected) exposure to other viral communicable diseases: Secondary | ICD-10-CM | POA: Diagnosis present

## 2019-06-08 DIAGNOSIS — R079 Chest pain, unspecified: Secondary | ICD-10-CM | POA: Diagnosis not present

## 2019-06-08 DIAGNOSIS — Z803 Family history of malignant neoplasm of breast: Secondary | ICD-10-CM

## 2019-06-08 DIAGNOSIS — I2510 Atherosclerotic heart disease of native coronary artery without angina pectoris: Secondary | ICD-10-CM | POA: Diagnosis present

## 2019-06-08 DIAGNOSIS — Z87891 Personal history of nicotine dependence: Secondary | ICD-10-CM

## 2019-06-08 DIAGNOSIS — N183 Chronic kidney disease, stage 3 unspecified: Secondary | ICD-10-CM | POA: Diagnosis present

## 2019-06-08 DIAGNOSIS — C779 Secondary and unspecified malignant neoplasm of lymph node, unspecified: Secondary | ICD-10-CM | POA: Diagnosis present

## 2019-06-08 DIAGNOSIS — G4739 Other sleep apnea: Secondary | ICD-10-CM

## 2019-06-08 DIAGNOSIS — J439 Emphysema, unspecified: Secondary | ICD-10-CM | POA: Diagnosis present

## 2019-06-08 DIAGNOSIS — I5032 Chronic diastolic (congestive) heart failure: Secondary | ICD-10-CM | POA: Diagnosis present

## 2019-06-08 DIAGNOSIS — E871 Hypo-osmolality and hyponatremia: Secondary | ICD-10-CM | POA: Diagnosis present

## 2019-06-08 DIAGNOSIS — Z823 Family history of stroke: Secondary | ICD-10-CM

## 2019-06-08 DIAGNOSIS — Z888 Allergy status to other drugs, medicaments and biological substances status: Secondary | ICD-10-CM

## 2019-06-08 DIAGNOSIS — Z79899 Other long term (current) drug therapy: Secondary | ICD-10-CM

## 2019-06-08 DIAGNOSIS — R0602 Shortness of breath: Secondary | ICD-10-CM | POA: Diagnosis present

## 2019-06-08 DIAGNOSIS — C7802 Secondary malignant neoplasm of left lung: Secondary | ICD-10-CM | POA: Diagnosis present

## 2019-06-08 DIAGNOSIS — E041 Nontoxic single thyroid nodule: Secondary | ICD-10-CM

## 2019-06-08 DIAGNOSIS — J189 Pneumonia, unspecified organism: Secondary | ICD-10-CM | POA: Diagnosis present

## 2019-06-08 LAB — BASIC METABOLIC PANEL
Anion gap: 12 (ref 5–15)
BUN: 17 mg/dL (ref 8–23)
CO2: 22 mmol/L (ref 22–32)
Calcium: 9.5 mg/dL (ref 8.9–10.3)
Chloride: 100 mmol/L (ref 98–111)
Creatinine, Ser: 1.05 mg/dL — ABNORMAL HIGH (ref 0.44–1.00)
GFR calc Af Amer: 54 mL/min — ABNORMAL LOW (ref >60)
GFR calc non Af Amer: 47 mL/min — ABNORMAL LOW (ref >60)
Glucose, Bld: 119 mg/dL — ABNORMAL HIGH (ref 70–99)
Potassium: 3.8 mmol/L (ref 3.5–5.1)
Sodium: 134 mmol/L — ABNORMAL LOW (ref 135–145)

## 2019-06-08 LAB — TROPONIN I (HIGH SENSITIVITY)
Troponin I (High Sensitivity): 4 ng/L (ref <18)
Troponin I (High Sensitivity): 4 ng/L (ref <18)

## 2019-06-08 LAB — CBC
HCT: 42.7 % (ref 36.0–46.0)
Hemoglobin: 14.2 g/dL (ref 12.0–15.0)
MCH: 29.5 pg (ref 26.0–34.0)
MCHC: 33.3 g/dL (ref 30.0–36.0)
MCV: 88.8 fL (ref 80.0–100.0)
Platelets: 368 10*3/uL (ref 150–400)
RBC: 4.81 MIL/uL (ref 3.87–5.11)
RDW: 13.6 % (ref 11.5–15.5)
WBC: 13.5 10*3/uL — ABNORMAL HIGH (ref 4.0–10.5)
nRBC: 0 % (ref 0.0–0.2)

## 2019-06-08 LAB — D-DIMER, QUANTITATIVE: D-Dimer, Quant: 0.75 ug/mL-FEU — ABNORMAL HIGH (ref 0.00–0.50)

## 2019-06-08 LAB — MAGNESIUM: Magnesium: 2 mg/dL (ref 1.7–2.4)

## 2019-06-08 LAB — BRAIN NATRIURETIC PEPTIDE: B Natriuretic Peptide: 24.3 pg/mL (ref 0.0–100.0)

## 2019-06-08 MED ORDER — PREDNISONE 50 MG PO TABS
50.0000 mg | ORAL_TABLET | Freq: Once | ORAL | Status: AC
  Administered 2019-06-08: 50 mg via ORAL
  Filled 2019-06-08: qty 1

## 2019-06-08 MED ORDER — SODIUM CHLORIDE (PF) 0.9 % IJ SOLN
INTRAMUSCULAR | Status: AC
  Administered 2019-06-08: 21:00:00
  Filled 2019-06-08: qty 50

## 2019-06-08 MED ORDER — SODIUM CHLORIDE 0.9% FLUSH: 3.0000 mL | Freq: Once | INTRAVENOUS | Status: DC

## 2019-06-08 MED ORDER — DIPHENHYDRAMINE HCL 50 MG/ML IJ SOLN
25.0000 mg | Freq: Once | INTRAMUSCULAR | Status: AC
  Administered 2019-06-08: 19:00:00 25 mg via INTRAVENOUS
  Filled 2019-06-08: qty 1

## 2019-06-08 MED ORDER — IOHEXOL 300 MG/ML  SOLN
75.0000 mL | Freq: Once | INTRAMUSCULAR | Status: AC | PRN
  Administered 2019-06-08: 75 mL via INTRAVENOUS

## 2019-06-08 MED ORDER — DIPHENHYDRAMINE HCL 50 MG/ML IJ SOLN: 25.0000 mg | Freq: Once | INTRAMUSCULAR | Status: DC

## 2019-06-08 NOTE — ED Provider Notes (Signed)
[facility] DEPT Provider Note   CSN: [id number] Arrival date & time: 06/08/19  1214     History   Chief Complaint Chief Complaint  Patient presents with  . Chest Pain    HPI KYLANI WIRES is a 85 y.o. female.  Presents emerged department after episodes of shortness of breath, palpitations.  Patient states this morning she was in her normal state of health, not having any symptoms.  While she was at the grocery store walking around shopping she noted episode of feeling somewhat short of breath as well as having a fast heart rate.  Unsure what her heart rate was, no associated chest pain.  States that this resolved spontaneously, currently not having any symptoms.  Denies chest pain but has had some intermittent chest aching sensation, 1-2 out of 10 in severity, seems to come and go at random, not associated with exertion, not currently having this pain.  No fever, has had dry nonproductive cough for the past few weeks.  Denies prior history of heart disease, no DVT or PE history.  No prior history of A. fib.     HPI  Past Medical History:  Diagnosis Date  . ACE-inhibitor cough   . COPD (chronic obstructive pulmonary disease) ([place])   . Diastolic dysfunction   . History of tobacco use    smoked 25 years  . Hypertension   . Mild CAD   . Mild pulmonary hypertension ([place])   . OSA (obstructive sleep apnea)    Sleep Study 04/07/2007 - AHI during total sleep 21.30/hr, during REM 20.16/hr - BiPAP auto servo-ventilation unit    Patient Active Problem List   Diagnosis Date Noted  . Dizziness 05/19/2018  . Mild CAD 12/03/2013  . 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 Hyperlipidemia 12/03/2013  . Essential hypertension 12/03/2013  . Dyspnea 03/25/2012  . Pulmonary emphysema ([place]) 03/25/2012    Past Surgical History:  Procedure Laterality Date  . benign lesion removed on lung  2000  . BLADDER REPAIR    . CARDIAC CATHETERIZATION  05/20/2007    mild coronary obstructive disease with 20% narrowing in prox LAD, diffuse luminal irregularity of 40-50% in mid RCA (Dr. Corky Downs)  . Cardiopulmonary Met Test  01/28/2012   with PFTs - FEV1 & FEV1/VC WNL, VC WNL, DLCO WNL; abnormal pulmonary response  . Carotid Doppler  04/2011   normal patency  . CARPAL TUNNEL RELEASE     x2  . CATARACT EXTRACTION, BILATERAL    . GALLBLADDER SURGERY    . NM MYOCAR PERF WALL MOTION  09/2011   lexiscan myoview - normal perfusion, EF 77%, low risk  . Renal Doppler  05/2007   normal renal arteries   . TONSILLECTOMY    . TRANSTHORACIC ECHOCARDIOGRAM  2013   EF 50-55%; mild MR; mild TR; mild pulm valve regurg; aortic root sclerosis/calcification  . VAGINAL HYSTERECTOMY    . VARICOSE VEIN SURGERY       OB History   No obstetric history on file.      Home Medications    Prior to Admission medications   Medication Sig Start Date End Date Taking? Authorizing Provider  amLODipine (NORVASC) 10 MG tablet Take 1 tablet (10 mg total) by mouth daily. 03/27/19  Yes Lendon Colonel, NP  aspirin 81 MG tablet Take 81 mg by mouth daily.   Yes [provider]  Calcium 1500 MG tablet Take 1,500 mg by mouth daily.   Yes [provider]  cholecalciferol (VITAMIN D) 1000 UNITS tablet Take 1,000 Units by mouth daily. Take 1 tab daily 12/21/14  Yes [provider]  NON FORMULARY at bedtime. BiPAP   Yes [provider]  Omega-3 Fatty Acids (FISH OIL) 1000 MG CAPS Take 1 capsule by mouth daily.    Yes [provider]  RESTASIS 0.05 % ophthalmic emulsion Place 1 drop into both eyes 2 (two) times daily. Use 1 drop in each eye twice a day 03/15/15  Yes [provider]  zolpidem (AMBIEN) 10 MG tablet Take 0.5 tablets (5 mg total) by mouth at bedtime as needed for sleep. Patient taking differently: Take 10 mg by mouth at bedtime as needed for sleep.  06/02/19 07/02/19 Yes Olalere, Adewale A, MD  rOPINIRole (REQUIP) 0.5 MG  tablet Take 1 tablet (0.5 mg total) by mouth at bedtime. Patient not taking: Reported on 06/08/2019 05/21/19   Sherrilyn Rist A, MD  rosuvastatin (CRESTOR) 5 MG tablet TAKE 1 TABLET EVERY OTHER DAY Patient taking differently: Take 5 mg by mouth See admin instructions.  05/08/19   Troy Sine, MD    Family History Family History  Problem Relation Age of Onset  . Lung cancer Mother   . Suicidality Father   . Cancer Sister   . Breast cancer Daughter   . Stroke Sister     Social History Social History   Tobacco Use  . Smoking status: Former Smoker    Packs/day: 0.50    Years: 25.00    Pack years: 12.50    Types: Cigarettes    Quit date: 08/13/1984    Years since quitting: 34.8  . Smokeless tobacco: Never Used  Substance Use Topics  . Alcohol use: No  . Drug use: No     Allergies   Acetaminophen, Zetia [ezetimibe], Gabapentin, Hydrochlorothiazide, Ace inhibitors, Codeine, Erythromycin, Etodolac, and Ivp dye [iodinated diagnostic agents]   Review of Systems Review of Systems  Constitutional: Negative for chills and fever.  HENT: Negative for ear pain and sore throat.   Eyes: Negative for pain and visual disturbance.  Respiratory: Positive for cough and shortness of breath.   Cardiovascular: Positive for palpitations. Negative for chest pain.  Gastrointestinal: Negative for abdominal pain and vomiting.  Genitourinary: Negative for dysuria and hematuria.  Musculoskeletal: Negative for arthralgias and back pain.  Skin: Negative for color change and rash.  Neurological: Negative for seizures and syncope.  All other systems reviewed and are negative.    Physical Exam Updated Vital Signs BP (!) 149/128   Pulse 88   Temp 98.3 F (36.8 C) (Oral)   Resp (!) 22   SpO2 98%   Physical Exam Vitals signs and nursing note reviewed.  Constitutional:      General: She is not in acute distress.    Appearance: She is well-developed.  HENT:     Head: Normocephalic and  atraumatic.  Eyes:     Conjunctiva/sclera: Conjunctivae normal.  Neck:     Musculoskeletal: Neck supple.  Cardiovascular:     Rate and Rhythm: Normal rate and regular rhythm.     Heart sounds: No murmur.  Pulmonary:     Effort: Pulmonary effort is normal. No respiratory distress.     Breath sounds: Normal breath sounds.  Abdominal:     Palpations: Abdomen is soft.     Tenderness: There is no abdominal tenderness.  Musculoskeletal:     Right lower leg: No edema.     Left lower leg: No  edema.  Skin:    General: Skin is warm and dry.     Capillary Refill: Capillary refill takes less than 2 seconds.  Neurological:     General: No focal deficit present.     Mental Status: She is alert.      ED Treatments / Results  Labs (all labs ordered are listed, but only abnormal results are displayed) Labs Reviewed  BASIC METABOLIC PANEL - Abnormal; Notable for the following components:      Result Value   Sodium 134 (*)    Glucose, Bld 119 (*)    Creatinine, Ser 1.05 (*)    GFR calc non Af Amer 47 (*)    GFR calc Af Amer 54 (*)    All other components within normal limits  CBC - Abnormal; Notable for the following components:   WBC 13.5 (*)    All other components within normal limits  D-DIMER, QUANTITATIVE (NOT AT [facility]) - Abnormal; Notable for the following components:   D-Dimer, Quant 0.75 (*)    All other components within normal limits  BRAIN NATRIURETIC PEPTIDE  MAGNESIUM  TROPONIN I (HIGH SENSITIVITY)  TROPONIN I (HIGH SENSITIVITY)    EKG EKG Interpretation  Date/Time:  Monday June 08 2019 14:39:36 EDT Ventricular Rate:  85 PR Interval:    QRS Duration: 52 QT Interval:  353 QTC Calculation: 420 R Axis:   111 Text Interpretation: Sinus rhythm no qt prolongation No st segment or t wave changes no significant change when compared to prior ecg Confirmed by Madalyn Rob [phone]) on 06/08/2019 3:37:00 PM   Radiology Dg Chest 2 View  Result Date:  06/08/2019 CLINICAL DATA:  Chest pain with acute onset shortness of breath and tachycardia today. EXAM: CHEST - 2 VIEW COMPARISON:  03/30/2015 FINDINGS: Lungs are adequately inflated and demonstrate airspace consolidation over the right upper lobe with mild patchy density over the right midlung and left mid to lower lung suggesting multifocal infection. Possible subtle air-fluid level within the consolidation in the posterior right upper lobe. Cardiomediastinal silhouette and remainder of the exam is unchanged. No evidence of effusion. IMPRESSION: Multifocal airspace process worse over the right upper lobe with possible air-fluid level over the right upper lobe which may be due to cavitary process or abscess. Consider chest CT for further evaluation. Electronically Signed   By: Marin Olp M.D.   On: 06/08/2019 14:53    Procedures Procedures (including critical care time)  Medications Ordered in ED Medications  sodium chloride flush (NS) 0.9 % injection 3 mL ( Intravenous Canceled Entry 06/08/19 1243)  diphenhydrAMINE (BENADRYL) injection 25 mg (has no administration in time range)  predniSONE (DELTASONE) tablet 50 mg (50 mg Oral Given 06/08/19 1605)     Initial Impression / Assessment and Plan / ED Course  I have reviewed the triage vital signs and the nursing notes.  Pertinent labs & imaging results that were available during my care of the patient were reviewed by me and considered in my medical decision making (see chart for details).  Clinical Course as of Jun 08 1651  Mon Jun 08, 2019  1307 Completed initial assessment, well appearing and asymptomatic at this time   [RD]  1313 Age adjusted D dimer wnl  D-Dimer, Quant(!): 0.75 [RD]  1608 Rechcekd patient, updated on cxr result and plan for CT scan   [RD]  1647 Signed out to Dr. Stark Jock   [RD]    Clinical Course User Index [RD] Lucrezia Starch, MD  9-year-old lady presents to ER after episode of palpitations which  resolved prior to arrival here.  Here patient was asymptomatic, well-appearing with normal vital signs.  EKG showed sinus rhythm.  Troponin x2 within normal limits, doubt ACS.  Her age-adjusted D-dimer was within normal limits, no tachypnea, or hypoxia, doubt PE.  Screening chest x-ray showed right upper lobe mass versus abscess versus infiltrate.  Patient will need CT with contrast to further evaluate.  While awaiting CT scan, patient was signed out to Dr. Stark Jock, please refer to his note for final plan disposition.  Final Clinical Impressions(s) / ED Diagnoses   Final diagnoses:  Palpitations    ED Discharge Orders    None       Lucrezia Starch, MD 06/08/19 1654

## 2019-06-08 NOTE — ED Provider Notes (Signed)
Care assumed from Dr. Roslynn Amble at shift change.  Patient originally presented here with complaints of shortness of breath, palpitations, and near syncope which occurred while walking in the grocery store.  Patient's work-up shows an abnormal chest x-ray concerning for mass versus abscess.  Care was signed out to me awaiting results of a CT scan to further define the above findings.  CT scan was eventually performed after contrast allergy protocol.  Unfortunately, this revealed what appeared to be bilateral lung masses as well as multiple mediastinal lymph node involvement.  This finding was discussed with Dr. Marin Olp from oncology.  It was his recommendation that the patient be admitted for further staging and establishment of treatment plan.  I have spoken with Dr. Roel Cluck who agrees to admit.   Veryl Speak, MD 06/08/19 2240

## 2019-06-08 NOTE — ED Notes (Signed)
Per CT- give prednisone now, benadryl at 7pm. CT scan at 8pm

## 2019-06-08 NOTE — H&P (Addendum)
[name] NTI:[id number] DOB: April 30, 1927 DOA: 06/08/2019     PCP: Jonathon Jordan, MD   Outpatient Specialists:  CARDS:  Dr. Claiborne Billings, Joyice Faster, MD    Patient arrived to ER on 06/08/19 at 1214  Patient coming from: home Lives With family    Chief Complaint:  Chief Complaint  Patient presents with  . Chest Pain    HPI: Jo Hendrix is a 85 y.o. female with medical history significant of COPD, diastolic CHF, HTN, CAD, pulmonary hypertension, OSA    Presented with shortness of breath and rapid heart rate while in the grocery store today With.  She was feeling just fine this morning.  She did endorse later on a bit of a chest aching 1 out of 10 not associated with exertion currently resolved otherwise no fevers no chills she had a bit of a cough for past few weeks. She called her pulmonologist for severe insomnia who recommended trying to use Ambien that is the only new medication she has had recently   Infectious risk factors:  Reports shortness of breath  chest pain    In  ER RAPID COVID TEST   in house testing  Pending  No results found for: SARSCOV2NAA   Regarding pertinent Chronic problems:     Hyperlipidemia -  on statins Crestor   HTN on Norvasc   CHF diastolic - last echo 8/67/6195 ejection fraction of 60-65%.  no increase in left ventricular wall thickness      COPD -  followed by pulmonology not on baseline oxygen     OSA - compliant with CPAP    While in ER: Noted to be slightly elevated D-dimer 0.75/elevated white blood cell count Chest x-ray was worrisome for mass versus pneumonia Patient had to be premedicated for chest CT with contrast that showed right upper lobe lung mass with mediastinal extension postobstructive atelectasis and pneumonitis as well as necrosis second mass in the left lower lobe pneumonitis and necrosis metastatic lymphadenopathy  The following Work up has been ordered so far:  Orders Placed This Encounter  Procedures  . DG  Chest 2 View  . CT Chest W Contrast  . Basic metabolic panel  . CBC  . Brain natriuretic peptide  . Magnesium  . D-dimer, quantitative (not at [facility])  . Cardiac monitoring  . Saline Lock IV, Maintain IV access  . Consult to oncology  ALL PATIENTS BEING ADMITTED/HAVING PROCEDURES NEED COVID-19 SCREENING  . Consult to hospitalist  ALL PATIENTS BEING ADMITTED/HAVING PROCEDURES NEED COVID-19 SCREENING  . Consult to hospitalist  ALL PATIENTS BEING ADMITTED/HAVING PROCEDURES NEED COVID-19 SCREENING  . Pulse oximetry, continuous  . ED EKG  . EKG 12-Lead  . ED EKG  . EKG 12-Lead  . EKG 12-Lead    Following Medications were ordered in ER: Medications  sodium chloride flush (NS) 0.9 % injection 3 mL ( Intravenous Canceled Entry 06/08/19 1243)  predniSONE (DELTASONE) tablet 50 mg (50 mg Oral Given 06/08/19 1605)  diphenhydrAMINE (BENADRYL) injection 25 mg (25 mg Intravenous Given 06/08/19 1858)  sodium chloride (PF) 0.9 % injection (  Given by Other 06/08/19 2115)  iohexol (OMNIPAQUE) 300 MG/ML solution 75 mL (75 mLs Intravenous Contrast Given 06/08/19 2009)        Consult Orders  (From admission, onward)         Start     Ordered   06/08/19 2134  Consult to hospitalist  ALL PATIENTS BEING ADMITTED/HAVING PROCEDURES NEED COVID-19 SCREENING  Once  Comments: ALL PATIENTS BEING ADMITTED/HAVING PROCEDURES NEED COVID-19 SCREENING  Provider:  (Not yet assigned)  Question Answer Comment  Place call to: [facility]   Reason for Consult Admit      06/08/19 2133   06/08/19 2105  Consult to hospitalist  ALL PATIENTS BEING ADMITTED/HAVING PROCEDURES NEED COVID-19 SCREENING  Once    Comments: ALL PATIENTS BEING ADMITTED/HAVING PROCEDURES NEED COVID-19 SCREENING  Provider:  (Not yet assigned)  Question Answer Comment  Place call to: [facility]   Reason for Consult Admit      06/08/19 2104          ER Provider Called:  Oncology    Dr. Marin Olp They Recommend admit to  medicine  Will see in AM    Significant initial  Findings: Abnormal Labs Reviewed  BASIC METABOLIC PANEL - Abnormal; Notable for the following components:      Result Value   Sodium 134 (*)    Glucose, Bld 119 (*)    Creatinine, Ser 1.05 (*)    GFR calc non Af Amer 47 (*)    GFR calc Af Amer 54 (*)    All other components within normal limits  CBC - Abnormal; Notable for the following components:   WBC 13.5 (*)    All other components within normal limits  D-DIMER, QUANTITATIVE (NOT AT [facility]) - Abnormal; Notable for the following components:   D-Dimer, Quant 0.75 (*)    All other components within normal limits    Otherwise labs showing:    Recent Labs  Lab 06/08/19 1244  NA 134*  K 3.8  CO2 22  GLUCOSE 119*  BUN 17  CREATININE 1.05*  CALCIUM 9.5  MG 2.0    Cr  Up from baseline see below Lab Results  Component Value Date   CREATININE 1.05 (H) 06/08/2019   CREATININE 0.99 09/22/2018   CREATININE 1.40 (H) 09/02/2017    No results for input(s): AST, ALT, ALKPHOS, BILITOT, PROT, ALBUMIN in the last 168 hours. Lab Results  Component Value Date   CALCIUM 9.5 06/08/2019    WBC      Component Value Date/Time   WBC 13.5 (H) 06/08/2019 1244   ANC    Component Value Date/Time   NEUTROABS 6.1 09/22/2018 1102   ALC No components found for: LYMPHAB    Plt: Lab Results  Component Value Date   PLT 368 06/08/2019      COVID-19 Labs  Recent Labs    06/08/19 1244  DDIMER 0.75*    No results found for: SARSCOV2NAA    HG/HCT  Stable,     Component Value Date/Time   HGB 14.2 06/08/2019 1244   HGB 13.9 09/22/2018 1102   HCT 42.7 06/08/2019 1244   HCT 41.5 09/22/2018 1102      ECG: Ordered Personally reviewed by me showing: HR :85 Rhythm:  NSR,    no evidence of ischemic changes QTC 420   BNP (last 3 results) Recent Labs    06/08/19 1244  BNP 24.3     UA  not ordered   Ordered   CXR -right upper lobe mass versus abscess versus  infiltrate    CT  chest w contrast -  evidence of infiltrate and Lung mass with metastasis      ED Triage Vitals  Enc Vitals Group     BP 06/08/19 1223 (!) 144/90     Pulse Rate 06/08/19 1223 99     Resp 06/08/19 1223 16  Temp 06/08/19 1223 98.3 F (36.8 C)     Temp Source 06/08/19 1223 Oral     SpO2 06/08/19 1223 95 %     Weight --      Height --      Head Circumference --      Peak Flow --      Pain Score 06/08/19 1224 2     Pain Loc --      Pain Edu? --      Excl. in [place]? --   TMAX(24)@       Latest  Blood pressure (!) 133/57, pulse 89, temperature 98.3 F (36.8 C), temperature source Oral, resp. rate (!) 26, SpO2 92 %.      Hospitalist was called for admission for lung cancer   Review of Systems:    Pertinent positives include: fatigue, non-productive cough,  shortness of breath at rest dyspnea on exertion weight loss palpitations Constitutional:  No weight loss, night sweats, Fevers, chills,   HEENT:  No headaches, Difficulty swallowing,Tooth/dental problems,Sore throat,  No sneezing, itching, ear ache, nasal congestion, post nasal drip,  Cardio-vascular:  No chest pain, Orthopnea, PND, anasarca, dizziness, palpitations.no Bilateral lower extremity swelling  GI:  No heartburn, indigestion, abdominal pain, nausea, vomiting, diarrhea, change in bowel habits, loss of appetite, melena, blood in stool, hematemesis Resp:    No excess mucus, no productive cough, No No coughing up of blood.No change in color of mucus.No wheezing. Skin:  no rash or lesions. No jaundice GU:  no dysuria, change in color of urine, no urgency or frequency. No straining to urinate.  No flank pain.  Musculoskeletal:  No joint pain or no joint swelling. No decreased range of motion. No back pain.  Psych:  No change in mood or affect. No depression or anxiety. No memory loss.  Neuro: no localizing neurological complaints, no tingling, no weakness, no double vision, no gait abnormality,  no slurred speech, no confusion  All systems reviewed and apart from [place] all are negative  Past Medical History:   Past Medical History:  Diagnosis Date  . ACE-inhibitor cough   . COPD (chronic obstructive pulmonary disease) ([place])   . Diastolic dysfunction   . History of tobacco use    smoked 25 years  . Hypertension   . Mild CAD   . Mild pulmonary hypertension ([place])   . OSA (obstructive sleep apnea)    Sleep Study 04/07/2007 - AHI during total sleep 21.30/hr, during REM 20.16/hr - BiPAP auto servo-ventilation unit       Past Surgical History:  Procedure Laterality Date  . benign lesion removed on lung  2000  . BLADDER REPAIR    . CARDIAC CATHETERIZATION  05/20/2007   mild coronary obstructive disease with 20% narrowing in prox LAD, diffuse luminal irregularity of 40-50% in mid RCA (Dr. Corky Downs)  . Cardiopulmonary Met Test  01/28/2012   with PFTs - FEV1 & FEV1/VC WNL, VC WNL, DLCO WNL; abnormal pulmonary response  . Carotid Doppler  04/2011   normal patency  . CARPAL TUNNEL RELEASE     x2  . CATARACT EXTRACTION, BILATERAL    . GALLBLADDER SURGERY    . NM MYOCAR PERF WALL MOTION  09/2011   lexiscan myoview - normal perfusion, EF 77%, low risk  . Renal Doppler  05/2007   normal renal arteries   . TONSILLECTOMY    . TRANSTHORACIC ECHOCARDIOGRAM  2013   EF 50-55%; mild MR; mild TR; mild pulm valve regurg; aortic root  sclerosis/calcification  . VAGINAL HYSTERECTOMY    . VARICOSE VEIN SURGERY      Social History:  Ambulatory  Independently      reports that she quit smoking about 34 years ago. Her smoking use included cigarettes. She has a 12.50 pack-year smoking history. She has never used smokeless tobacco. She reports that she does not drink alcohol or use drugs.     Family History:   Family History  Problem Relation Age of Onset  . Lung cancer Mother   . Suicidality Father   . Cancer Sister   . Breast cancer Daughter   . Stroke Sister      Allergies: Allergies  Allergen Reactions  . Acetaminophen Anaphylaxis and Other (See Comments)    Difficulty urinating Difficulty urinating   . Zetia [Ezetimibe] Other (See Comments)    Chest pressure  . Gabapentin     dizziness  . Hydrochlorothiazide Nausea And Vomiting    Dizziness  . Ace Inhibitors Cough  . Codeine Other (See Comments)    difficulty urinating  . Erythromycin Other (See Comments)    DOESN'T REMEMBER   . Etodolac Nausea Only  . Ivp Dye [Iodinated Diagnostic Agents] Hives    hives     Prior to Admission medications   Medication Sig Start Date End Date Taking? Authorizing Provider  amLODipine (NORVASC) 10 MG tablet Take 1 tablet (10 mg total) by mouth daily. 03/27/19  Yes Lendon Colonel, NP  aspirin 81 MG tablet Take 81 mg by mouth daily.   Yes [provider]  Calcium 1500 MG tablet Take 1,500 mg by mouth daily.   Yes [provider]  cholecalciferol (VITAMIN D) 1000 UNITS tablet Take 1,000 Units by mouth daily. Take 1 tab daily 12/21/14  Yes [provider]  NON FORMULARY at bedtime. BiPAP   Yes [provider]  Omega-3 Fatty Acids (FISH OIL) 1000 MG CAPS Take 1 capsule by mouth daily.    Yes [provider]  RESTASIS 0.05 % ophthalmic emulsion Place 1 drop into both eyes 2 (two) times daily. Use 1 drop in each eye twice a day 03/15/15  Yes [provider]  zolpidem (AMBIEN) 10 MG tablet Take 0.5 tablets (5 mg total) by mouth at bedtime as needed for sleep. Patient taking differently: Take 10 mg by mouth at bedtime as needed for sleep.  06/02/19 07/02/19 Yes Olalere, Adewale A, MD  rOPINIRole (REQUIP) 0.5 MG tablet Take 1 tablet (0.5 mg total) by mouth at bedtime. Patient not taking: Reported on 06/08/2019 05/21/19   Sherrilyn Rist A, MD  rosuvastatin (CRESTOR) 5 MG tablet TAKE 1 TABLET EVERY OTHER DAY Patient taking differently: Take 5 mg by mouth See admin instructions.  05/08/19   Troy Sine,  MD   Physical Exam: Blood pressure (!) 133/57, pulse 89, temperature 98.3 F (36.8 C), temperature source Oral, resp. rate (!) 26, SpO2 92 %. 1. General:  in No  Acute distress   well  -appearing 2. Psychological: Alert and   Oriented 3. Head/ENT:    Dry Mucous Membranes                          Head Non traumatic, neck supple                           Poor Dentition 4. SKIN:   decreased Skin turgor,  Skin clean Dry and intact no  rash 5. Heart: Regular rate and rhythm no  Murmur, no Rub or gallop 6. Lungs:   no wheezes or crackles   7. Abdomen: Soft non-tender, Non distended bowel sounds present 8. Lower extremities: no clubbing, cyanosis, no  edema 9. Neurologically Grossly intact, moving all 4 extremities equally   10. MSK: Normal range of motion   All other LABS:     Recent Labs  Lab 06/08/19 1244  WBC 13.5*  HGB 14.2  HCT 42.7  MCV 88.8  PLT 368     Recent Labs  Lab 06/08/19 1244  NA 134*  K 3.8  CL 100  CO2 22  GLUCOSE 119*  BUN 17  CREATININE 1.05*  CALCIUM 9.5  MG 2.0     No results for input(s): AST, ALT, ALKPHOS, BILITOT, PROT, ALBUMIN in the last 168 hours.     Cultures: No results found for: SDES, [place], CULT, REPTSTATUS   Radiological Exams on Admission: Dg Chest 2 View  Result Date: 06/08/2019 CLINICAL DATA:  Chest pain with acute onset shortness of breath and tachycardia today. EXAM: CHEST - 2 VIEW COMPARISON:  03/30/2015 FINDINGS: Lungs are adequately inflated and demonstrate airspace consolidation over the right upper lobe with mild patchy density over the right midlung and left mid to lower lung suggesting multifocal infection. Possible subtle air-fluid level within the consolidation in the posterior right upper lobe. Cardiomediastinal silhouette and remainder of the exam is unchanged. No evidence of effusion. IMPRESSION: Multifocal airspace process worse over the right upper lobe with possible air-fluid level over the right upper lobe  which may be due to cavitary process or abscess. Consider chest CT for further evaluation. Electronically Signed   By: Marin Olp M.D.   On: 06/08/2019 14:53   Ct Chest W Contrast  Result Date: 06/08/2019 CLINICAL DATA:  85 year old with a possible cavitary lesion involving the RIGHT UPPER LOBE on chest x-ray earlier today. She presented with acute chest pain, palpitations and shortness of breath. She has also had a nonproductive cough for the past several weeks EXAM: CT CHEST WITH CONTRAST TECHNIQUE: Multidetector CT imaging of the chest was performed during intravenous contrast administration. CONTRAST:  80m OMNIPAQUE IOHEXOL 300 MG/ML IV. COMPARISON:  Chest x-ray earlier same day. CTA chest 05/02/2007. FINDINGS: Cardiovascular: Normal heart size. Moderate three-vessel coronary atherosclerosis. Mitral annular calcification. No pericardial effusion. Moderate to severe atherosclerosis involving the thoracic and proximal abdominal aorta without evidence of aneurysm. Mediastinum/Nodes: Mass which is likely centered in the [place] extension into the mediastinum, estimated measurements of approximately 4 x 4 x 5.5 cm (2/34 and 6/74). RIGHT hilar lymphadenopathy. No mediastinal lymphadenopathy elsewhere. Normal appearing esophagus. Approximate 1.2 cm RIGHT lobe thyroid nodule, not present on the 2008 CT. Lungs/Pleura: Central RIGHT UPPER LOBE lung mass with mediastinal extension as described above. This obstructs segmental RIGHT UPPER LOBE bronchi, causing post-obstructive atelectasis and post-obstructive pneumonitis with necrosis. A second mass is suspected in the SUPERIOR segment LEFT LOWER LOBE measuring approximately 4 x 3 x 4 cm (5/57 and 6/90). There is associated pneumonitis with necrosis in the SUPERIOR segment. Emphysematous changes in the LEFT UPPER LOBE. No parenchymal nodules or masses in the LEFT lung. Minimal linear scar or atelectasis in the LEFT LOWER LOBE. No pleural  effusions. Upper Abdomen: Diverticulosis involving the visualized proximal descending colon without evidence of acute diverticulitis. Benign cortical cysts involving the visualized UPPER poles of both kidneys. No acute abnormalities involving the visualized upper abdomen. Musculoskeletal: Degenerative changes throughout the  thoracic spine. No acute findings. IMPRESSION: 1. Central RIGHT UPPER LOBE lung mass with mediastinal extension as described above and associated post-obstructive atelectasis and pneumonitis in the RIGHT UPPER LOBE, associated with necrosis. 2. Second mass is suspected in the superior segment LEFT LOWER LOBE with associated pneumonitis and necrosis. 3. RIGHT hilar lymphadenopathy, likely metastatic. 4. No evidence of metastatic disease elsewhere. 5. Approximate 1.2 cm RIGHT lobe thyroid nodule, not present on the 2008 CT. Thyroid ultrasound may be confirmatory. Non-emergent PET-CT is recommended in further evaluation to confirm the above findings. Aortic Atherosclerosis (ICD10-I70.0) and Emphysema (ICD10-J43.9). Electronically Signed   By: Evangeline Dakin M.D.   On: 06/08/2019 20:35    Chart has been reviewed    Assessment/Plan  85 y.o. female with medical history significant of COPD, diastolic CHF, HTN, CAD, pulmonary hypertension, OSA    Admitted for lung mass  Present on Admission: . Lung mass -patient with known extensive history of tobacco abuse currently in remission some weight loss, intermittent chest pains and shortness of breath was found to have bilateral lung masses most consistent with primary metastatic spread.  Discussed with patient who states "my understanding I have cancer".  She is unsure what she wants to do about it but does want to talk to oncology first. Dr. Marin Olp has been consulted by ER provider She is allergic to CT contrast will hold off on further staging at this time patient just had contrasted study of her chest   . Palpitations/chest pain today  -D-dimer age-appropriate no significant EKG changes to suggest ischemia . monitor on telemetry currently resolved, troponin within normal limits Order echogram  . Pulmonary emphysema ([place]) -chronic patient with history of tobacco abuse currently in remission current appears to be stable  . Hyperlipidemia -chronic continue home medications  . Essential hypertension chronic currently stable  OSA continue CPAP/BiPAP if COVID test is negative  Other plan as per orders.  DVT prophylaxis:  SCD in case she may need a procedure  Code Status:  DNR/DNI   as per patient  I had personally discussed CODE STATUS with patient    Family Communication:   Family not at  Bedside    Disposition Plan:      To home once workup is complete and patient is stable                                          Consults called: Oncology aware  Admission status:  ED Disposition    None      Obs    Level of care  tele  For   24H    Precautions:  NONE  No active isolations  PPE: Used by the provider:   P100  eye Goggles,  Gloves    Jahmil Macleod 06/09/2019, 12:32 AM    [facility]     after 2 AM please page floor coverage PA If 7AM-7PM, please contact the day team taking care of the patient using [email or web address]

## 2019-06-08 NOTE — ED Triage Notes (Signed)
Pt complaint of acute onset of SOB/rapid heart rate while in grocery store just prior to arrival. Pt also verbalizes central chest aching for a few days.

## 2019-06-08 NOTE — Discharge Instructions (Signed)
Recommend schedule follow-up appointment both with your primary doctor as well as your cardiologist.  If you have recurrent symptoms, develop any difficulty breathing, chest pain, fever, please return to ER for reassessment.

## 2019-06-08 NOTE — Telephone Encounter (Signed)
LMOMTCB x 1 

## 2019-06-08 NOTE — Telephone Encounter (Signed)
Yes, can try a whole tab of zolpidem

## 2019-06-09 ENCOUNTER — Inpatient Hospital Stay (HOSPITAL_COMMUNITY): Payer: Medicare HMO

## 2019-06-09 ENCOUNTER — Encounter (HOSPITAL_COMMUNITY): Payer: Self-pay | Admitting: Oncology

## 2019-06-09 DIAGNOSIS — J189 Pneumonia, unspecified organism: Secondary | ICD-10-CM | POA: Diagnosis present

## 2019-06-09 DIAGNOSIS — Z90710 Acquired absence of both cervix and uterus: Secondary | ICD-10-CM | POA: Diagnosis not present

## 2019-06-09 DIAGNOSIS — C3491 Malignant neoplasm of unspecified part of right bronchus or lung: Secondary | ICD-10-CM | POA: Diagnosis present

## 2019-06-09 DIAGNOSIS — R630 Anorexia: Secondary | ICD-10-CM | POA: Diagnosis present

## 2019-06-09 DIAGNOSIS — Z20828 Contact with and (suspected) exposure to other viral communicable diseases: Secondary | ICD-10-CM | POA: Diagnosis present

## 2019-06-09 DIAGNOSIS — Z888 Allergy status to other drugs, medicaments and biological substances status: Secondary | ICD-10-CM | POA: Diagnosis not present

## 2019-06-09 DIAGNOSIS — C3411 Malignant neoplasm of upper lobe, right bronchus or lung: Secondary | ICD-10-CM | POA: Diagnosis present

## 2019-06-09 DIAGNOSIS — I130 Hypertensive heart and chronic kidney disease with heart failure and stage 1 through stage 4 chronic kidney disease, or unspecified chronic kidney disease: Secondary | ICD-10-CM | POA: Diagnosis present

## 2019-06-09 DIAGNOSIS — I371 Nonrheumatic pulmonary valve insufficiency: Secondary | ICD-10-CM | POA: Diagnosis not present

## 2019-06-09 DIAGNOSIS — I361 Nonrheumatic tricuspid (valve) insufficiency: Secondary | ICD-10-CM | POA: Diagnosis not present

## 2019-06-09 DIAGNOSIS — E041 Nontoxic single thyroid nodule: Secondary | ICD-10-CM | POA: Diagnosis present

## 2019-06-09 DIAGNOSIS — J439 Emphysema, unspecified: Secondary | ICD-10-CM | POA: Diagnosis present

## 2019-06-09 DIAGNOSIS — E871 Hypo-osmolality and hyponatremia: Secondary | ICD-10-CM | POA: Diagnosis present

## 2019-06-09 DIAGNOSIS — G4731 Primary central sleep apnea: Secondary | ICD-10-CM | POA: Diagnosis not present

## 2019-06-09 DIAGNOSIS — C779 Secondary and unspecified malignant neoplasm of lymph node, unspecified: Secondary | ICD-10-CM | POA: Diagnosis present

## 2019-06-09 DIAGNOSIS — C7802 Secondary malignant neoplasm of left lung: Secondary | ICD-10-CM | POA: Diagnosis present

## 2019-06-09 DIAGNOSIS — I5032 Chronic diastolic (congestive) heart failure: Secondary | ICD-10-CM | POA: Diagnosis present

## 2019-06-09 DIAGNOSIS — I2510 Atherosclerotic heart disease of native coronary artery without angina pectoris: Secondary | ICD-10-CM | POA: Diagnosis present

## 2019-06-09 DIAGNOSIS — R0602 Shortness of breath: Secondary | ICD-10-CM

## 2019-06-09 DIAGNOSIS — Z87891 Personal history of nicotine dependence: Secondary | ICD-10-CM | POA: Diagnosis not present

## 2019-06-09 DIAGNOSIS — R079 Chest pain, unspecified: Secondary | ICD-10-CM | POA: Diagnosis present

## 2019-06-09 DIAGNOSIS — I7389 Other specified peripheral vascular diseases: Secondary | ICD-10-CM | POA: Diagnosis present

## 2019-06-09 DIAGNOSIS — R002 Palpitations: Secondary | ICD-10-CM | POA: Diagnosis not present

## 2019-06-09 DIAGNOSIS — Z885 Allergy status to narcotic agent status: Secondary | ICD-10-CM | POA: Diagnosis not present

## 2019-06-09 DIAGNOSIS — I272 Pulmonary hypertension, unspecified: Secondary | ICD-10-CM | POA: Diagnosis present

## 2019-06-09 DIAGNOSIS — E785 Hyperlipidemia, unspecified: Secondary | ICD-10-CM | POA: Diagnosis present

## 2019-06-09 DIAGNOSIS — I10 Essential (primary) hypertension: Secondary | ICD-10-CM | POA: Diagnosis not present

## 2019-06-09 DIAGNOSIS — Z66 Do not resuscitate: Secondary | ICD-10-CM | POA: Diagnosis present

## 2019-06-09 DIAGNOSIS — N183 Chronic kidney disease, stage 3 unspecified: Secondary | ICD-10-CM | POA: Diagnosis present

## 2019-06-09 DIAGNOSIS — Z79899 Other long term (current) drug therapy: Secondary | ICD-10-CM | POA: Diagnosis not present

## 2019-06-09 DIAGNOSIS — Z886 Allergy status to analgesic agent status: Secondary | ICD-10-CM | POA: Diagnosis not present

## 2019-06-09 DIAGNOSIS — G4733 Obstructive sleep apnea (adult) (pediatric): Secondary | ICD-10-CM | POA: Diagnosis present

## 2019-06-09 DIAGNOSIS — R918 Other nonspecific abnormal finding of lung field: Secondary | ICD-10-CM | POA: Diagnosis not present

## 2019-06-09 LAB — CBC WITH DIFFERENTIAL/PLATELET
Abs Immature Granulocytes: 0.11 10*3/uL — ABNORMAL HIGH (ref 0.00–0.07)
Basophils Absolute: 0 10*3/uL (ref 0.0–0.1)
Basophils Relative: 0 %
Eosinophils Absolute: 0 10*3/uL (ref 0.0–0.5)
Eosinophils Relative: 0 %
HCT: 44.5 % (ref 36.0–46.0)
Hemoglobin: 14.7 g/dL (ref 12.0–15.0)
Immature Granulocytes: 1 %
Lymphocytes Relative: 17 %
Lymphs Abs: 2.3 10*3/uL (ref 0.7–4.0)
MCH: 29.4 pg (ref 26.0–34.0)
MCHC: 33 g/dL (ref 30.0–36.0)
MCV: 89 fL (ref 80.0–100.0)
Monocytes Absolute: 1 10*3/uL (ref 0.1–1.0)
Monocytes Relative: 7 %
Neutro Abs: 10.3 10*3/uL — ABNORMAL HIGH (ref 1.7–7.7)
Neutrophils Relative %: 75 %
Platelets: 375 10*3/uL (ref 150–400)
RBC: 5 MIL/uL (ref 3.87–5.11)
RDW: 13.5 % (ref 11.5–15.5)
WBC: 13.8 10*3/uL — ABNORMAL HIGH (ref 4.0–10.5)
nRBC: 0 % (ref 0.0–0.2)

## 2019-06-09 LAB — COMPREHENSIVE METABOLIC PANEL
ALT: 13 U/L (ref 0–44)
ALT: 12 U/L (ref 0–44)
AST: 17 U/L (ref 15–41)
AST: 14 U/L — ABNORMAL LOW (ref 15–41)
Albumin: 3.9 g/dL (ref 3.5–5.0)
Albumin: 3.5 g/dL (ref 3.5–5.0)
Alkaline Phosphatase: 71 U/L (ref 38–126)
Alkaline Phosphatase: 73 U/L (ref 38–126)
Anion gap: 12 (ref 5–15)
Anion gap: 11 (ref 5–15)
BUN: 18 mg/dL (ref 8–23)
BUN: 21 mg/dL (ref 8–23)
CO2: 23 mmol/L (ref 22–32)
CO2: 23 mmol/L (ref 22–32)
Calcium: 9.3 mg/dL (ref 8.9–10.3)
Calcium: 9 mg/dL (ref 8.9–10.3)
Chloride: 101 mmol/L (ref 98–111)
Chloride: 101 mmol/L (ref 98–111)
Creatinine, Ser: 1.12 mg/dL — ABNORMAL HIGH (ref 0.44–1.00)
Creatinine, Ser: 1.13 mg/dL — ABNORMAL HIGH (ref 0.44–1.00)
GFR calc Af Amer: 50 mL/min — ABNORMAL LOW (ref >60)
GFR calc Af Amer: 50 mL/min — ABNORMAL LOW (ref >60)
GFR calc non Af Amer: 43 mL/min — ABNORMAL LOW (ref >60)
GFR calc non Af Amer: 43 mL/min — ABNORMAL LOW (ref >60)
Glucose, Bld: 111 mg/dL — ABNORMAL HIGH (ref 70–99)
Glucose, Bld: 119 mg/dL — ABNORMAL HIGH (ref 70–99)
Potassium: 3.7 mmol/L (ref 3.5–5.1)
Potassium: 3.7 mmol/L (ref 3.5–5.1)
Sodium: 136 mmol/L (ref 135–145)
Sodium: 135 mmol/L (ref 135–145)
Total Bilirubin: 0.4 mg/dL (ref 0.3–1.2)
Total Bilirubin: 0.4 mg/dL (ref 0.3–1.2)
Total Protein: 8.5 g/dL — ABNORMAL HIGH (ref 6.5–8.1)
Total Protein: 7.5 g/dL (ref 6.5–8.1)

## 2019-06-09 LAB — CBC
HCT: 41.5 % (ref 36.0–46.0)
Hemoglobin: 13.7 g/dL (ref 12.0–15.0)
MCH: 29.7 pg (ref 26.0–34.0)
MCHC: 33 g/dL (ref 30.0–36.0)
MCV: 89.8 fL (ref 80.0–100.0)
Platelets: 337 10*3/uL (ref 150–400)
RBC: 4.62 MIL/uL (ref 3.87–5.11)
RDW: 13.6 % (ref 11.5–15.5)
WBC: 14.1 10*3/uL — ABNORMAL HIGH (ref 4.0–10.5)
nRBC: 0 % (ref 0.0–0.2)

## 2019-06-09 LAB — PHOSPHORUS
Phosphorus: 3.4 mg/dL (ref 2.5–4.6)
Phosphorus: 3.5 mg/dL (ref 2.5–4.6)

## 2019-06-09 LAB — MAGNESIUM
Magnesium: 2 mg/dL (ref 1.7–2.4)
Magnesium: 2 mg/dL (ref 1.7–2.4)

## 2019-06-09 LAB — SARS CORONAVIRUS 2 (TAT 6-24 HRS): SARS Coronavirus 2: NEGATIVE

## 2019-06-09 LAB — TSH: TSH: 0.46 u[IU]/mL (ref 0.350–4.500)

## 2019-06-09 MED ORDER — DIPHENHYDRAMINE HCL 50 MG PO CAPS: 50.0000 mg | ORAL_CAPSULE | Freq: Once | ORAL | Status: AC

## 2019-06-09 MED ORDER — IOHEXOL 300 MG/ML  SOLN
75.0000 mL | Freq: Once | INTRAMUSCULAR | Status: AC | PRN
  Administered 2019-06-09: 75 mL via INTRAVENOUS

## 2019-06-09 MED ORDER — PREDNISONE 50 MG PO TABS
50.0000 mg | ORAL_TABLET | Freq: Four times a day (QID) | ORAL | Status: AC
  Administered 2019-06-09 – 2019-06-10 (×3): 50 mg via ORAL
  Filled 2019-06-09 (×3): qty 1

## 2019-06-09 MED ORDER — LORAZEPAM 2 MG/ML IJ SOLN
0.5000 mg | Freq: Once | INTRAMUSCULAR | Status: AC | PRN
  Administered 2019-06-09: 0.5 mg via INTRAVENOUS
  Filled 2019-06-09: qty 1

## 2019-06-09 MED ORDER — SODIUM CHLORIDE 0.9 % IV SOLN
INTRAVENOUS | Status: AC
  Administered 2019-06-09 – 2019-06-10 (×2): via INTRAVENOUS

## 2019-06-09 MED ORDER — ONDANSETRON HCL 4 MG/2ML IJ SOLN: 4.0000 mg | Freq: Four times a day (QID) | INTRAMUSCULAR | Status: DC | PRN

## 2019-06-09 MED ORDER — CYCLOSPORINE 0.05 % OP EMUL
1.0000 [drp] | Freq: Two times a day (BID) | OPHTHALMIC | Status: DC
  Administered 2019-06-09 – 2019-06-12 (×6): 1 [drp] via OPHTHALMIC
  Filled 2019-06-09 (×7): qty 30

## 2019-06-09 MED ORDER — GADOBUTROL 1 MMOL/ML IV SOLN
7.5000 mL | Freq: Once | INTRAVENOUS | Status: AC | PRN
  Administered 2019-06-09: 7.5 mL via INTRAVENOUS

## 2019-06-09 MED ORDER — ZOLPIDEM TARTRATE 5 MG PO TABS
5.0000 mg | ORAL_TABLET | Freq: Every evening | ORAL | Status: DC | PRN
  Administered 2019-06-10 (×2): 5 mg via ORAL
  Filled 2019-06-09 (×2): qty 1

## 2019-06-09 MED ORDER — ALBUTEROL SULFATE HFA 108 (90 BASE) MCG/ACT IN AERS: 2.0000 | INHALATION_SPRAY | RESPIRATORY_TRACT | Status: DC | PRN

## 2019-06-09 MED ORDER — DIPHENHYDRAMINE HCL 50 MG/ML IJ SOLN
50.0000 mg | Freq: Once | INTRAMUSCULAR | Status: AC
  Administered 2019-06-09: 50 mg via INTRAVENOUS
  Filled 2019-06-09: qty 1

## 2019-06-09 MED ORDER — SODIUM CHLORIDE (PF) 0.9 % IJ SOLN
INTRAMUSCULAR | Status: AC
  Filled 2019-06-09: qty 50

## 2019-06-09 MED ORDER — ALBUTEROL SULFATE (2.5 MG/3ML) 0.083% IN NEBU: 2.5000 mg | INHALATION_SOLUTION | RESPIRATORY_TRACT | Status: DC | PRN

## 2019-06-09 MED ORDER — AMLODIPINE BESYLATE 10 MG PO TABS
10.0000 mg | ORAL_TABLET | Freq: Every day | ORAL | Status: DC
  Administered 2019-06-09 – 2019-06-12 (×4): 10 mg via ORAL
  Filled 2019-06-09 (×4): qty 1

## 2019-06-09 MED ORDER — ROSUVASTATIN CALCIUM 5 MG PO TABS
5.0000 mg | ORAL_TABLET | ORAL | Status: DC
  Administered 2019-06-09 – 2019-06-11 (×2): 5 mg via ORAL
  Filled 2019-06-09 (×2): qty 1

## 2019-06-09 MED ORDER — ONDANSETRON HCL 4 MG PO TABS: 4.0000 mg | ORAL_TABLET | Freq: Four times a day (QID) | ORAL | Status: DC | PRN

## 2019-06-09 MED ORDER — ROPINIROLE HCL 1 MG PO TABS
0.5000 mg | ORAL_TABLET | Freq: Every evening | ORAL | Status: DC | PRN
  Filled 2019-06-09: qty 1

## 2019-06-09 NOTE — Consult Note (Addendum)
[facility]  [phone] [phone]   MEDICAL ONCOLOGY - INITIAL CONSULTATION  Referral MD: Dr. Kerney Elbe  Reason for Referral: Right upper lobe lung mass, suspected left lower lobe mass, right hilar lymphaednopathy  HPI: Ms. Fiorenza is a 85 year old female with a past medical history significant for COPD, diastolic CHF, HTN, CAD, pulmonary hypertension, and OSA.  The patient presented to the emergency room with shortness of breath and feeling like her heart was racing after being at the grocery store.  She also had some mild chest discomfort which she rated 1/10.  In the emergency room, her D-dimer was mildly elevated at 0.75 and her white blood cell count was mildly elevated at 13.5.  She has mild hyponatremia with a sodium of 134 and a mildly elevated creatinine of 1.05 although this appears to be consistent with her baseline.  She initially had a chest x-ray which showed multifocal airspace process worse over the right upper lobe with possible air-fluid level over the right upper lobe which may be due to a cavitary process or abscess.  A CT was recommended for further evaluation.  A CT of the chest with contrast showed a central right upper lobe lung mass with mediastinal extension and associated postobstructive atelectasis and pneumonitis in the right upper lobe, associated with necrosis, second mass suspected in the superior segment of the left lower lobe with associated pneumonitis and necrosis, right hilar lymphadenopathy, likely metastatic, 1.2 cm right lobe thyroid nodule.  When seen today, her son is at the bedside.  She appears much younger than her reported age.  Prior to admission, the patient reports that her appetite has been poor.  She thinks she has lost about 5 to 6 pounds over the past few months.  She has not had any fevers or chills.  Denies headaches and dizziness.  She reports the mild chest discomfort that was present upon presentation to  the ER that has now resolved.  Her shortness of breath has improved.  She reports having a nonproductive cough for the past few months.  Denies hemoptysis.  The patient denies abdominal pain, nausea, vomiting, constipation, diarrhea.  Reports intermittent lower extremity edema which she attributes to her amlodipine.  The patient is widowed.  She lives with her son.  She reports that she has 3 sons that live in [place].  Denies alcohol use.  Has a history of smoking half a pack of cigarettes per day for approximate 25 years.  She quit in 1986.  The patient is independent with ADLs and IADLs.  She is able to drive herself.  Family history is significant for a mother with breast cancer, sister with cancer (she does not know the primary site), daughter who died from breast cancer, and a son with thyroid cancer.  Medical oncology was asked see the patient to make recommendations regarding her lung mass.   Past Medical History:  Diagnosis Date  . ACE-inhibitor cough   . COPD (chronic obstructive pulmonary disease) ([place])   . Diastolic dysfunction   . History of tobacco use    smoked 25 years  . Hypertension   . Mild CAD   . Mild pulmonary hypertension ([place])   . OSA (obstructive sleep apnea)    Sleep Study 04/07/2007 - AHI during total sleep 21.30/hr, during REM 20.16/hr - BiPAP auto servo-ventilation unit  :  Past Surgical History:  Procedure Laterality Date  . benign lesion removed on lung  2000  .  BLADDER REPAIR    . CARDIAC CATHETERIZATION  05/20/2007   mild coronary obstructive disease with 20% narrowing in prox LAD, diffuse luminal irregularity of 40-50% in mid RCA (Dr. Corky Downs)  . Cardiopulmonary Met Test  01/28/2012   with PFTs - FEV1 & FEV1/VC WNL, VC WNL, DLCO WNL; abnormal pulmonary response  . Carotid Doppler  04/2011   normal patency  . CARPAL TUNNEL RELEASE     x2  . CATARACT EXTRACTION, BILATERAL    . GALLBLADDER SURGERY    . NM MYOCAR PERF WALL MOTION  09/2011   lexiscan  myoview - normal perfusion, EF 77%, low risk  . Renal Doppler  05/2007   normal renal arteries   . TONSILLECTOMY    . TRANSTHORACIC ECHOCARDIOGRAM  2013   EF 50-55%; mild MR; mild TR; mild pulm valve regurg; aortic root sclerosis/calcification  . VAGINAL HYSTERECTOMY    . VARICOSE VEIN SURGERY    :  Current Facility-Administered Medications  Medication Dose Route Frequency Provider Last Rate Last Dose  . sodium chloride flush (NS) 0.9 % injection 3 mL  3 mL Intravenous Once Lucrezia Starch, MD       Current Outpatient Medications  Medication Sig Dispense Refill  . amLODipine (NORVASC) 10 MG tablet Take 1 tablet (10 mg total) by mouth daily. 90 tablet 1  . aspirin 81 MG tablet Take 81 mg by mouth daily.    . Calcium 1500 MG tablet Take 1,500 mg by mouth daily.    . cholecalciferol (VITAMIN D) 1000 UNITS tablet Take 1,000 Units by mouth daily. Take 1 tab daily    . NON FORMULARY at bedtime. BiPAP    . Omega-3 Fatty Acids (FISH OIL) 1000 MG CAPS Take 1 capsule by mouth daily.     . RESTASIS 0.05 % ophthalmic emulsion Place 1 drop into both eyes 2 (two) times daily. Use 1 drop in each eye twice a day    . zolpidem (AMBIEN) 10 MG tablet Take 0.5 tablets (5 mg total) by mouth at bedtime as needed for sleep. (Patient taking differently: Take 10 mg by mouth at bedtime as needed for sleep. ) 30 tablet 3  . rOPINIRole (REQUIP) 0.5 MG tablet Take 1 tablet (0.5 mg total) by mouth at bedtime. (Patient not taking: Reported on 06/08/2019) 30 tablet 1  . rosuvastatin (CRESTOR) 5 MG tablet TAKE 1 TABLET EVERY OTHER DAY (Patient taking differently: Take 5 mg by mouth See admin instructions. ) 45 tablet 1   Facility-Administered Medications Ordered in Other Encounters  Medication Dose Route Frequency Provider Last Rate Last Dose  . aminophylline injection 75 mg  75 mg Intravenous TID PRN Larey Dresser, MD   75 mg at 04/07/15 1105     Allergies  Allergen Reactions  . Acetaminophen Anaphylaxis  and Other (See Comments)    Difficulty urinating Difficulty urinating   . Zetia [Ezetimibe] Other (See Comments)    Chest pressure  . Gabapentin     dizziness  . Hydrochlorothiazide Nausea And Vomiting    Dizziness  . Ace Inhibitors Cough  . Codeine Other (See Comments)    difficulty urinating  . Erythromycin Other (See Comments)    DOESN'T REMEMBER   . Etodolac Nausea Only  . Ivp Dye [Iodinated Diagnostic Agents] Hives    hives  :  Family History  Problem Relation Age of Onset  . Lung cancer Mother   . Suicidality Father   . Cancer Sister   . Breast cancer Daughter   .  Stroke Sister   :  Social History   Socioeconomic History  . Marital status: Widowed    Spouse name: Not on file  . Number of children: 3  . Years of education: Not on file  . Highest education level: Not on file  Occupational History  . Occupation: retired  Scientific laboratory technician  . Financial resource strain: Not on file  . Food insecurity    Worry: Not on file    Inability: Not on file  . Transportation needs    Medical: Not on file    Non-medical: Not on file  Tobacco Use  . Smoking status: Former Smoker    Packs/day: 0.50    Years: 25.00    Pack years: 12.50    Types: Cigarettes    Quit date: 08/13/1984    Years since quitting: 34.8  . Smokeless tobacco: Never Used  Substance and Sexual Activity  . Alcohol use: No  . Drug use: No  . Sexual activity: Not on file  Lifestyle  . Physical activity    Days per week: Not on file    Minutes per session: Not on file  . Stress: Not on file  Relationships  . Social Herbalist on phone: Not on file    Gets together: Not on file    Attends religious service: Not on file    Active member of club or organization: Not on file    Attends meetings of clubs or organizations: Not on file    Relationship status: Not on file  . Intimate partner violence    Fear of current or ex partner: Not on file    Emotionally abused: Not on file     Physically abused: Not on file    Forced sexual activity: Not on file  Other Topics Concern  . Not on file  Social History Narrative  . Not on file  :  Review of Systems: A comprehensive 14 point review of systems was negative except as noted in the HPI.  Exam: Patient Vitals for the past 24 hrs:  BP Temp Temp src Pulse Resp SpO2  06/09/19 1100 127/77 - - 79 20 96 %  06/09/19 1030 126/76 - - 80 (!) 21 95 %  06/09/19 0930 125/67 - - 87 (!) 24 95 %  06/09/19 0800 127/67 - - 86 20 90 %  06/09/19 0700 126/64 - - 78 18 92 %  06/09/19 0630 (!) 127/56 - - 72 (!) 21 96 %  06/09/19 0530 - - - 79 18 98 %  06/09/19 0500 - - - 76 15 94 %  06/09/19 0430 - - - 79 19 92 %  06/09/19 0400 - - - 76 (!) 22 95 %  06/09/19 0330 - - - 82 17 96 %  06/09/19 0300 - - - 90 19 97 %  06/09/19 0200 (!) 153/81 - - 87 (!) 21 92 %  06/09/19 0100 (!) 115/54 - - 79 (!) 22 (!) 89 %  06/09/19 0030 (!) 126/51 - - 76 (!) 23 94 %  06/09/19 0000 133/62 - - 86 (!) 23 94 %  06/08/19 2330 (!) 142/85 - - (!) 102 (!) 24 92 %  06/08/19 2300 - - - 83 (!) 24 94 %  06/08/19 2230 139/72 - - 86 (!) 21 93 %  06/08/19 2200 (!) 141/75 - - 84 (!) 26 93 %  06/08/19 2130 (!) 133/57 - - 89 (!) 26 92 %  06/08/19 2100 Marland Kitchen)  154/91 - - 98 - 91 %  06/08/19 2030 (!) 139/95 - - 83 (!) 22 95 %  06/08/19 2000 129/74 - - 87 20 94 %  06/08/19 1945 - - - - - 96 %  06/08/19 1930 114/77 - - 80 (!) 33 -  06/08/19 1902 (!) 147/84 - - 87 (!) 25 95 %  06/08/19 1901 (!) 147/84 - - - 18 -  06/08/19 1830 112/90 - - 79 (!) 22 93 %  06/08/19 1800 (!) 134/119 - - 99 (!) 36 96 %  06/08/19 1730 130/67 - - 83 (!) 27 92 %  06/08/19 1700 124/77 - - 83 (!) 23 97 %  06/08/19 1630 (!) 149/128 - - 88 (!) 22 98 %  06/08/19 1600 (!) 155/77 - - 89 (!) 24 95 %  06/08/19 1530 136/71 - - 84 (!) 22 94 %  06/08/19 1516 128/77 - - 85 19 95 %  06/08/19 1430 (!) 167/61 - - 84 (!) 22 97 %  06/08/19 1401 (!) 125/111 - - 76 (!) 24 94 %  06/08/19 1330 129/61 - - 83 20 95  %  06/08/19 1300 (!) 146/75 - - 84 (!) 23 93 %  06/08/19 1223 (!) 144/90 98.3 F (36.8 C) Oral 99 16 95 %    General:  well-nourished in no acute distress.  Appears much younger than stated age. Eyes:  no scleral icterus.   ENT:  There were no oropharyngeal lesions.   Neck was without thyromegaly.   Lymphatics:  Negative cervical, supraclavicular or axillary adenopathy.   Respiratory: lungs were clear bilaterally without wheezing or crackles.   Cardiovascular:  Regular rate and rhythm, S1/S2, without murmur, rub or gallop.  There was no pedal edema.   GI:  abdomen was soft, flat, nontender, nondistended, without organomegaly.   Musculoskeletal:  no spinal tenderness of palpation of vertebral spine.   Skin exam was without echymosis, petichae.   Neuro exam was nonfocal. Patient was alert and oriented.  Attention was good.   Language was appropriate.  Mood was normal without depression.  Speech was not pressured.  Thought content was not tangential.     Lab Results  Component Value Date   WBC 13.8 (H) 06/09/2019   HGB 14.7 06/09/2019   HCT 44.5 06/09/2019   PLT 375 06/09/2019   GLUCOSE 111 (H) 06/09/2019   CHOL 214 (H) 05/03/2017   TRIG 97 05/03/2017   HDL 63 05/03/2017   LDLCALC 132 (H) 05/03/2017   ALT 13 06/09/2019   AST 17 06/09/2019   NA 136 06/09/2019   K 3.7 06/09/2019   CL 101 06/09/2019   CREATININE 1.12 (H) 06/09/2019   BUN 18 06/09/2019   CO2 23 06/09/2019    Dg Chest 2 View  Result Date: 06/08/2019 CLINICAL DATA:  Chest pain with acute onset shortness of breath and tachycardia today. EXAM: CHEST - 2 VIEW COMPARISON:  03/30/2015 FINDINGS: Lungs are adequately inflated and demonstrate airspace consolidation over the right upper lobe with mild patchy density over the right midlung and left mid to lower lung suggesting multifocal infection. Possible subtle air-fluid level within the consolidation in the posterior right upper lobe. Cardiomediastinal silhouette and  remainder of the exam is unchanged. No evidence of effusion. IMPRESSION: Multifocal airspace process worse over the right upper lobe with possible air-fluid level over the right upper lobe which may be due to cavitary process or abscess. Consider chest CT for further evaluation. Electronically Signed   By: Quillian Quince  Derrel Nip M.D.   On: 06/08/2019 14:53   Ct Chest W Contrast  Result Date: 06/08/2019 CLINICAL DATA:  85 year old with a possible cavitary lesion involving the RIGHT UPPER LOBE on chest x-ray earlier today. She presented with acute chest pain, palpitations and shortness of breath. She has also had a nonproductive cough for the past several weeks EXAM: CT CHEST WITH CONTRAST TECHNIQUE: Multidetector CT imaging of the chest was performed during intravenous contrast administration. CONTRAST:  45m OMNIPAQUE IOHEXOL 300 MG/ML IV. COMPARISON:  Chest x-ray earlier same day. CTA chest 05/02/2007. FINDINGS: Cardiovascular: Normal heart size. Moderate three-vessel coronary atherosclerosis. Mitral annular calcification. No pericardial effusion. Moderate to severe atherosclerosis involving the thoracic and proximal abdominal aorta without evidence of aneurysm. Mediastinum/Nodes: Mass which is likely centered in the [place] into the mediastinum, estimated measurements of approximately 4 x 4 x 5.5 cm (2/34 and 6/74). RIGHT hilar lymphadenopathy. No mediastinal lymphadenopathy elsewhere. Normal appearing esophagus. Approximate 1.2 cm RIGHT lobe thyroid nodule, not present on the 2008 CT. Lungs/Pleura: Central RIGHT UPPER LOBE lung mass with mediastinal extension as described above. This obstructs segmental RIGHT UPPER LOBE bronchi, causing post-obstructive atelectasis and post-obstructive pneumonitis with necrosis. A second mass is suspected in the SUPERIOR segment LEFT LOWER LOBE measuring approximately 4 x 3 x 4 cm (5/57 and 6/90). There is associated pneumonitis with necrosis in the  SUPERIOR segment. Emphysematous changes in the LEFT UPPER LOBE. No parenchymal nodules or masses in the LEFT lung. Minimal linear scar or atelectasis in the LEFT LOWER LOBE. No pleural effusions. Upper Abdomen: Diverticulosis involving the visualized proximal descending colon without evidence of acute diverticulitis. Benign cortical cysts involving the visualized UPPER poles of both kidneys. No acute abnormalities involving the visualized upper abdomen. Musculoskeletal: Degenerative changes throughout the thoracic spine. No acute findings. IMPRESSION: 1. Central RIGHT UPPER LOBE lung mass with mediastinal extension as described above and associated post-obstructive atelectasis and pneumonitis in the RIGHT UPPER LOBE, associated with necrosis. 2. Second mass is suspected in the superior segment LEFT LOWER LOBE with associated pneumonitis and necrosis. 3. RIGHT hilar lymphadenopathy, likely metastatic. 4. No evidence of metastatic disease elsewhere. 5. Approximate 1.2 cm RIGHT lobe thyroid nodule, not present on the 2008 CT. Thyroid ultrasound may be confirmatory. Non-emergent PET-CT is recommended in further evaluation to confirm the above findings. Aortic Atherosclerosis (ICD10-I70.0) and Emphysema (ICD10-J43.9). Electronically Signed   By: TEvangeline DakinM.D.   On: 06/08/2019 20:35     Dg Chest 2 View  Result Date: 06/08/2019 CLINICAL DATA:  Chest pain with acute onset shortness of breath and tachycardia today. EXAM: CHEST - 2 VIEW COMPARISON:  03/30/2015 FINDINGS: Lungs are adequately inflated and demonstrate airspace consolidation over the right upper lobe with mild patchy density over the right midlung and left mid to lower lung suggesting multifocal infection. Possible subtle air-fluid level within the consolidation in the posterior right upper lobe. Cardiomediastinal silhouette and remainder of the exam is unchanged. No evidence of effusion. IMPRESSION: Multifocal airspace process worse over the right  upper lobe with possible air-fluid level over the right upper lobe which may be due to cavitary process or abscess. Consider chest CT for further evaluation. Electronically Signed   By: DMarin OlpM.D.   On: 06/08/2019 14:53   Ct Chest W Contrast  Result Date: 06/08/2019 CLINICAL DATA:  85year old with a possible cavitary lesion involving the RIGHT UPPER LOBE on chest x-ray earlier today. She presented with acute chest pain, palpitations and shortness of  breath. She has also had a nonproductive cough for the past several weeks EXAM: CT CHEST WITH CONTRAST TECHNIQUE: Multidetector CT imaging of the chest was performed during intravenous contrast administration. CONTRAST:  89m OMNIPAQUE IOHEXOL 300 MG/ML IV. COMPARISON:  Chest x-ray earlier same day. CTA chest 05/02/2007. FINDINGS: Cardiovascular: Normal heart size. Moderate three-vessel coronary atherosclerosis. Mitral annular calcification. No pericardial effusion. Moderate to severe atherosclerosis involving the thoracic and proximal abdominal aorta without evidence of aneurysm. Mediastinum/Nodes: Mass which is likely centered in the [place] extension into the mediastinum, estimated measurements of approximately 4 x 4 x 5.5 cm (2/34 and 6/74). RIGHT hilar lymphadenopathy. No mediastinal lymphadenopathy elsewhere. Normal appearing esophagus. Approximate 1.2 cm RIGHT lobe thyroid nodule, not present on the 2008 CT. Lungs/Pleura: Central RIGHT UPPER LOBE lung mass with mediastinal extension as described above. This obstructs segmental RIGHT UPPER LOBE bronchi, causing post-obstructive atelectasis and post-obstructive pneumonitis with necrosis. A second mass is suspected in the SUPERIOR segment LEFT LOWER LOBE measuring approximately 4 x 3 x 4 cm (5/57 and 6/90). There is associated pneumonitis with necrosis in the SUPERIOR segment. Emphysematous changes in the LEFT UPPER LOBE. No parenchymal nodules or masses in the LEFT lung. Minimal  linear scar or atelectasis in the LEFT LOWER LOBE. No pleural effusions. Upper Abdomen: Diverticulosis involving the visualized proximal descending colon without evidence of acute diverticulitis. Benign cortical cysts involving the visualized UPPER poles of both kidneys. No acute abnormalities involving the visualized upper abdomen. Musculoskeletal: Degenerative changes throughout the thoracic spine. No acute findings. IMPRESSION: 1. Central RIGHT UPPER LOBE lung mass with mediastinal extension as described above and associated post-obstructive atelectasis and pneumonitis in the RIGHT UPPER LOBE, associated with necrosis. 2. Second mass is suspected in the superior segment LEFT LOWER LOBE with associated pneumonitis and necrosis. 3. RIGHT hilar lymphadenopathy, likely metastatic. 4. No evidence of metastatic disease elsewhere. 5. Approximate 1.2 cm RIGHT lobe thyroid nodule, not present on the 2008 CT. Thyroid ultrasound may be confirmatory. Non-emergent PET-CT is recommended in further evaluation to confirm the above findings. Aortic Atherosclerosis (ICD10-I70.0) and Emphysema (ICD10-J43.9). Electronically Signed   By: TEvangeline DakinM.D.   On: 06/08/2019 20:35   Assessment and Plan:  1. Right upper lobe lung mass, ?left lower lobe mass, right hilar lymphadenopathy 2. COPD 3. Hypertension 4. OSA 5. Right thyroid nodule   -I have discussed the CT scan findings with the patient and her son.  We have discussed that her scan is highly suspicious for lung cancer.  Recommend complete staging work-up including a CT of the abdomen pelvis with contrast (the patient will be need to be premedicated per radiology protocol due to her contrast allergy) and MRI of the brain with and without contrast. -The patient will need a biopsy to confirm the diagnosis.  Recommend pulmonology consult for consideration of bronchoscopy. -Recommend thyroid ultrasound further evaluation of her right lobe thyroid nodule. -Pending the  results of the above testing, will have a further discussion regarding the diagnosis, prognosis, and treatment options.  Thank you for this referral.   KMikey Bussing DNP, AGPCNP-BC, AOCNP   ADDENDUM: I saw and examined Ms. Santoro.  She is incredibly delightful.  She certainly does not look or act 85years old.  I have to believe that this is going to be a primary lung cancer.  She clearly has bilateral lung disease.  This would not be considered operable.  Her CT scan of the abdomen and pelvis did not show any  obvious metastatic disease or a primary site.  The MRI of the brain was suboptimal.  There were 2 small lesions that were felt to be vascular but of course, the radiologist cannot totally discount the possibility of metastasis.  I think that she definitely needs to have pulmonary see her.  She needs to have a bronchoscopy with biopsies.  I would think by the CT scan that there would be tissue that would be amenable to transbronchial biopsies.  The treatment recommendations will clearly be based upon the pathology.  I would think once she has the bronch or some kind of biopsy, she will be able to go home.  She is doing well.  She is not symptomatic.  She did have a PET scan done as an outpatient.  Again, I would think that radiation therapy probably is can be part of the treatment recommendations.  If this is truly lung cancer, I would think that this would be nonsmall cell lung cancer and not small cell lung cancer.  She has not smoked for about 35 years.  Once we get tissue, we can then send it off for molecular markers.  Of note, she is has her mother died of lung cancer and never was a smoker.  We will follow along as she is in the hospital.  I cannot think of any other test that needs to be done right now.  We really have to get a biopsy on her.  I appreciate everybody's help with Ms. Lanum.  I know that she will get fantastic care from all the staff on [address].  Lattie Haw, MD  [place] 15:2

## 2019-06-09 NOTE — Progress Notes (Signed)
Patient given IV benadryl given this afternoon for dye allergy to allow CT to be done.  Patient states it has made her feel "a little woozy" and restless.  States she does not feel that she can lay still for MRI. Floor coverage paged and made aware -  Oncoming RN also aware

## 2019-06-09 NOTE — ED Notes (Signed)
ED TO INPATIENT HANDOFF REPORT  ED Nurse Name and Phone #: [phone]  S Name/Age/Gender Jo Hendrix 85 y.o. female Room/Bed: WA11/WA11  Code Status   Code Status: Not on file  Home/SNF/Other Home Patient oriented to: self, place, time and situation Is this baseline? Yes   Triage Complete: Triage complete  Chief Complaint sob,fast heart beat  Triage Note Pt complaint of acute onset of SOB/rapid heart rate while in grocery store just prior to arrival. Pt also verbalizes central chest aching for a few days.   Allergies Allergies  Allergen Reactions  . Acetaminophen Anaphylaxis and Other (See Comments)    Difficulty urinating Difficulty urinating   . Zetia [Ezetimibe] Other (See Comments)    Chest pressure  . Gabapentin     dizziness  . Hydrochlorothiazide Nausea And Vomiting    Dizziness  . Ace Inhibitors Cough  . Codeine Other (See Comments)    difficulty urinating  . Erythromycin Other (See Comments)    DOESN'T REMEMBER   . Etodolac Nausea Only  . Ivp Dye [Iodinated Diagnostic Agents] Hives    hives    Level of Care/Admitting Diagnosis ED Disposition    ED Disposition Condition Comment   Admit  Hospital Area: [facility] [[id number]]  Level of Care: Telemetry [5]  Admit to tele based on following criteria: Monitor QTC interval  Covid Evaluation: Confirmed COVID Negative  Diagnosis: SOB (shortness of breath) [[id number]]  Admitting Physician: [place], [place] [[id number]]  Attending Physician: Raiford Noble LATIF [[id number]]  Estimated length of stay: past midnight tomorrow  Certification:: I certify this patient will need inpatient services for at least 2 midnights  PT Class (Do Not Modify): Inpatient [101]  PT Acc Code (Do Not Modify): Private [1]       B Medical/Surgery History Past Medical History:  Diagnosis Date  . ACE-inhibitor cough   . COPD (chronic obstructive pulmonary disease) ([place])   . Diastolic dysfunction   . History of  tobacco use    smoked 25 years  . Hypertension   . Mild CAD   . Mild pulmonary hypertension ([place])   . OSA (obstructive sleep apnea)    Sleep Study 04/07/2007 - AHI during total sleep 21.30/hr, during REM 20.16/hr - BiPAP auto servo-ventilation unit   Past Surgical History:  Procedure Laterality Date  . benign lesion removed on lung  2000  . BLADDER REPAIR    . CARDIAC CATHETERIZATION  05/20/2007   mild coronary obstructive disease with 20% narrowing in prox LAD, diffuse luminal irregularity of 40-50% in mid RCA (Dr. Corky Downs)  . Cardiopulmonary Met Test  01/28/2012   with PFTs - FEV1 & FEV1/VC WNL, VC WNL, DLCO WNL; abnormal pulmonary response  . Carotid Doppler  04/2011   normal patency  . CARPAL TUNNEL RELEASE     x2  . CATARACT EXTRACTION, BILATERAL    . GALLBLADDER SURGERY    . NM MYOCAR PERF WALL MOTION  09/2011   lexiscan myoview - normal perfusion, EF 77%, low risk  . Renal Doppler  05/2007   normal renal arteries   . TONSILLECTOMY    . TRANSTHORACIC ECHOCARDIOGRAM  2013   EF 50-55%; mild MR; mild TR; mild pulm valve regurg; aortic root sclerosis/calcification  . VAGINAL HYSTERECTOMY    . VARICOSE VEIN SURGERY       A IV Location/Drains/Wounds Patient Lines/Drains/Airways Status   Active Line/Drains/Airways    Name:   Placement date:   Placement time:   Site:  Days:   Peripheral IV 06/08/19 Right Forearm   06/08/19    1242    Forearm   1          Intake/Output Last 24 hours No intake or output data in the 24 hours ending 06/09/19 1548  Labs/Imaging Results for orders placed or performed during the hospital encounter of 06/08/19 (from the past 48 hour(s))  Basic metabolic panel     Status: Abnormal   Collection Time: 06/08/19 12:44 PM  Result Value Ref Range   Sodium 134 (L) 135 - 145 mmol/L   Potassium 3.8 3.5 - 5.1 mmol/L   Chloride 100 98 - 111 mmol/L   CO2 22 22 - 32 mmol/L   Glucose, Bld 119 (H) 70 - 99 mg/dL   BUN 17 8 - 23 mg/dL   Creatinine, Ser  1.05 (H) 0.44 - 1.00 mg/dL   Calcium 9.5 8.9 - 10.3 mg/dL   GFR calc non Af Amer 47 (L) >60 mL/min   GFR calc Af Amer 54 (L) >60 mL/min   Anion gap 12 5 - 15    Comment: Performed at [facility], [place] [address] [place], [place] [address]  CBC     Status: Abnormal   Collection Time: 06/08/19 12:44 PM  Result Value Ref Range   WBC 13.5 (H) 4.0 - 10.5 K/uL   RBC 4.81 3.87 - 5.11 MIL/uL   Hemoglobin 14.2 12.0 - 15.0 g/dL   HCT 42.7 36.0 - 46.0 %   MCV 88.8 80.0 - 100.0 fL   MCH 29.5 26.0 - 34.0 pg   MCHC 33.3 30.0 - 36.0 g/dL   RDW 13.6 11.5 - 15.5 %   Platelets 368 150 - 400 K/uL   nRBC 0.0 0.0 - 0.2 %    Comment: Performed at [facility], [place] [address], [place], [place] [address]  Troponin I (High Sensitivity)     Status: None   Collection Time: 06/08/19 12:44 PM  Result Value Ref Range   Troponin I (High Sensitivity) 4 <18 ng/L    Comment: (NOTE) Elevated high sensitivity troponin I (hsTnI) values and significant  changes across serial measurements may suggest ACS but many other  chronic and acute conditions are known to elevate hsTnI results.  Refer to the "Links" section for chest pain algorithms and additional  guidance. Performed at [facility], [place] [address], [place], [place] [address]   Brain natriuretic peptide     Status: None   Collection Time: 06/08/19 12:44 PM  Result Value Ref Range   B Natriuretic Peptide 24.3 0.0 - 100.0 pg/mL    Comment: Performed at [facility], [place] [address], [place], [place] [address]  Magnesium     Status: None   Collection Time: 06/08/19 12:44 PM  Result Value Ref Range   Magnesium 2.0 1.7 - 2.4 mg/dL    Comment: Performed at [facility], [place] [address] [place],  [address]  D-dimer, quantitative (not at [facility])     Status: Abnormal   Collection Time: 06/08/19 12:44 PM  Result Value Ref Range   D-Dimer, Quant 0.75 (H) 0.00 -  0.50 ug/mL-FEU    Comment: (NOTE) At the manufacturer cut-off of 0.50 ug/mL FEU, this assay has been documented to exclude PE with a sensitivity and negative predictive value of 97 to 99%.  At this time, this assay has not been approved by the FDA to exclude DVT/VTE. Results should be correlated with  clinical presentation. Performed at [facility], [place] [address] [place], [place] [address]   Troponin I (High Sensitivity)     Status: None   Collection Time: 06/08/19  2:42 PM  Result Value Ref Range   Troponin I (High Sensitivity) 4 <18 ng/L    Comment: (NOTE) Elevated high sensitivity troponin I (hsTnI) values and significant  changes across serial measurements may suggest ACS but many other  chronic and acute conditions are known to elevate hsTnI results.  Refer to the "Links" section for chest pain algorithms and additional  guidance. Performed at [facility], [place] [address] [place], [place] [address]   SARS CORONAVIRUS 2 (TAT 6-24 HRS) Nasopharyngeal Nasopharyngeal Swab     Status: None   Collection Time: 06/08/19 10:05 PM   Specimen: Nasopharyngeal Swab  Result Value Ref Range   SARS Coronavirus 2 NEGATIVE NEGATIVE    Comment: (NOTE) SARS-CoV-2 target nucleic acids are NOT DETECTED. The SARS-CoV-2 RNA is generally detectable in upper and lower respiratory specimens during the acute phase of infection. Negative results do not preclude SARS-CoV-2 infection, do not rule out co-infections with other pathogens, and should not be used as the sole basis for treatment or other patient management decisions. Negative results must be combined with clinical observations, patient history, and epidemiological information. The expected result is Negative. Fact Sheet for Patients: SugarRoll.be Fact Sheet for Healthcare Providers: https://www.woods-mathews.com/ This test is not yet approved or cleared by the  [place] FDA and  has been authorized for detection and/or diagnosis of SARS-CoV-2 by FDA under an Emergency Use Authorization (EUA). This EUA will remain  in effect (meaning this test can be used) for the duration of the COVID-19 declaration under Section 56 4(b)(1) of the Act, 21 U.S.C. section 360bbb-3(b)(1), unless the authorization is terminated or revoked sooner. Performed at [facility], [place] [address], [place], [place] [address]   CBC with Differential/Platelet     Status: Abnormal   Collection Time: 06/09/19 10:18 AM  Result Value Ref Range   WBC 13.8 (H) 4.0 - 10.5 K/uL   RBC 5.00 3.87 - 5.11 MIL/uL   Hemoglobin 14.7 12.0 - 15.0 g/dL   HCT 44.5 36.0 - 46.0 %   MCV 89.0 80.0 - 100.0 fL   MCH 29.4 26.0 - 34.0 pg   MCHC 33.0 30.0 - 36.0 g/dL   RDW 13.5 11.5 - 15.5 %   Platelets 375 150 - 400 K/uL   nRBC 0.0 0.0 - 0.2 %   Neutrophils Relative % 75 %   Neutro Abs 10.3 (H) 1.7 - 7.7 K/uL   Lymphocytes Relative 17 %   Lymphs Abs 2.3 0.7 - 4.0 K/uL   Monocytes Relative 7 %   Monocytes Absolute 1.0 0.1 - 1.0 K/uL   Eosinophils Relative 0 %   Eosinophils Absolute 0.0 0.0 - 0.5 K/uL   Basophils Relative 0 %   Basophils Absolute 0.0 0.0 - 0.1 K/uL   Immature Granulocytes 1 %   Abs Immature Granulocytes 0.11 (H) 0.00 - 0.07 K/uL    Comment: Performed at [facility], [place] [address] [place], [place] [address]  Comprehensive metabolic panel     Status: Abnormal   Collection Time: 06/09/19 10:18 AM  Result Value Ref Range   Sodium 136 135 - 145 mmol/L   Potassium 3.7 3.5 - 5.1 mmol/L   Chloride 101 98 - 111 mmol/L   CO2 23 22 - 32 mmol/L   Glucose,  Bld 111 (H) 70 - 99 mg/dL   BUN 18 8 - 23 mg/dL   Creatinine, Ser 1.12 (H) 0.44 - 1.00 mg/dL   Calcium 9.3 8.9 - 10.3 mg/dL   Total Protein 8.5 (H) 6.5 - 8.1 g/dL   Albumin 3.9 3.5 - 5.0 g/dL   AST 17 15 - 41 U/L   ALT 13 0 - 44 U/L   Alkaline Phosphatase 71 38 - 126 U/L   Total Bilirubin 0.4  0.3 - 1.2 mg/dL   GFR calc non Af Amer 43 (L) >60 mL/min   GFR calc Af Amer 50 (L) >60 mL/min   Anion gap 12 5 - 15    Comment: Performed at [facility], [place] [address], [place], [place] [address]  Magnesium     Status: None   Collection Time: 06/09/19 10:18 AM  Result Value Ref Range   Magnesium 2.0 1.7 - 2.4 mg/dL    Comment: Performed at [facility], [place] [address], [place], [place] [address]  Phosphorus     Status: None   Collection Time: 06/09/19 10:18 AM  Result Value Ref Range   Phosphorus 3.4 2.5 - 4.6 mg/dL    Comment: Performed at [facility], [place] [address], [place], [place] [address]   Dg Chest 2 View  Result Date: 06/08/2019 CLINICAL DATA:  Chest pain with acute onset shortness of breath and tachycardia today. EXAM: CHEST - 2 VIEW COMPARISON:  03/30/2015 FINDINGS: Lungs are adequately inflated and demonstrate airspace consolidation over the right upper lobe with mild patchy density over the right midlung and left mid to lower lung suggesting multifocal infection. Possible subtle air-fluid level within the consolidation in the posterior right upper lobe. Cardiomediastinal silhouette and remainder of the exam is unchanged. No evidence of effusion. IMPRESSION: Multifocal airspace process worse over the right upper lobe with possible air-fluid level over the right upper lobe which may be due to cavitary process or abscess. Consider chest CT for further evaluation. Electronically Signed   By: Marin Olp M.D.   On: 06/08/2019 14:53   Ct Chest W Contrast  Result Date: 06/08/2019 CLINICAL DATA:  85 year old with a possible cavitary lesion involving the RIGHT UPPER LOBE on chest x-ray earlier today. She presented with acute chest pain, palpitations and shortness of breath. She has also had a nonproductive cough for the past several weeks EXAM: CT CHEST WITH CONTRAST TECHNIQUE: Multidetector CT imaging of the chest was  performed during intravenous contrast administration. CONTRAST:  45m OMNIPAQUE IOHEXOL 300 MG/ML IV. COMPARISON:  Chest x-ray earlier same day. CTA chest 05/02/2007. FINDINGS: Cardiovascular: Normal heart size. Moderate three-vessel coronary atherosclerosis. Mitral annular calcification. No pericardial effusion. Moderate to severe atherosclerosis involving the thoracic and proximal abdominal aorta without evidence of aneurysm. Mediastinum/Nodes: Mass which is likely centered in the [place]with extension into the mediastinum, estimated measurements of approximately 4 x 4 x 5.5 cm (2/34 and 6/74). RIGHT hilar lymphadenopathy. No mediastinal lymphadenopathy elsewhere. Normal appearing esophagus. Approximate 1.2 cm RIGHT lobe thyroid nodule, not present on the 2008 CT. Lungs/Pleura: Central RIGHT UPPER LOBE lung mass with mediastinal extension as described above. This obstructs segmental RIGHT UPPER LOBE bronchi, causing post-obstructive atelectasis and post-obstructive pneumonitis with necrosis. A second mass is suspected in the SUPERIOR segment LEFT LOWER LOBE measuring approximately 4 x 3 x 4 cm (5/57 and 6/90). There is associated pneumonitis with necrosis in the SUPERIOR segment. Emphysematous changes in the LEFT UPPER LOBE. No  parenchymal nodules or masses in the LEFT lung. Minimal linear scar or atelectasis in the LEFT LOWER LOBE. No pleural effusions. Upper Abdomen: Diverticulosis involving the visualized proximal descending colon without evidence of acute diverticulitis. Benign cortical cysts involving the visualized UPPER poles of both kidneys. No acute abnormalities involving the visualized upper abdomen. Musculoskeletal: Degenerative changes throughout the thoracic spine. No acute findings. IMPRESSION: 1. Central RIGHT UPPER LOBE lung mass with mediastinal extension as described above and associated post-obstructive atelectasis and pneumonitis in the RIGHT UPPER LOBE, associated with  necrosis. 2. Second mass is suspected in the superior segment LEFT LOWER LOBE with associated pneumonitis and necrosis. 3. RIGHT hilar lymphadenopathy, likely metastatic. 4. No evidence of metastatic disease elsewhere. 5. Approximate 1.2 cm RIGHT lobe thyroid nodule, not present on the 2008 CT. Thyroid ultrasound may be confirmatory. Non-emergent PET-CT is recommended in further evaluation to confirm the above findings. Aortic Atherosclerosis (ICD10-I70.0) and Emphysema (ICD10-J43.9). Electronically Signed   By: Evangeline Dakin M.D.   On: 06/08/2019 20:35    Pending Labs [employer] (From admission, onward)    Start     Ordered   Signed and Held  Magnesium  Tomorrow morning,   R    Comments: Call MD if <1.5    Signed and Held   Signed and Held  Phosphorus  Tomorrow morning,   R     Signed and Held   Signed and Held  TSH  Once,   R    Comments: Cancel if already done within 1 month and notify MD    Signed and Held   Signed and Held  Comprehensive metabolic panel  Once,   R    Comments: Cal MD for K<3.5 or >5.0    Signed and Held   Signed and Held  CBC  Once,   R    Comments: Call for hg <8.0    Signed and Held          Vitals/Pain Today's Vitals   06/09/19 1100 06/09/19 1209 06/09/19 1407 06/09/19 1501  BP: 127/77 129/73 127/67 122/60  Pulse: 79 88 87 87  Resp: '20 18 16 16  ' Temp:      TempSrc:      SpO2: 96% 96% 94% 95%  PainSc:        Isolation Precautions No active isolations  Medications Medications  sodium chloride flush (NS) 0.9 % injection 3 mL ( Intravenous Canceled Entry 06/08/19 1243)  predniSONE (DELTASONE) tablet 50 mg (50 mg Oral Given 06/09/19 1448)  diphenhydrAMINE (BENADRYL) capsule 50 mg (has no administration in time range)    Or  diphenhydrAMINE (BENADRYL) injection 50 mg (has no administration in time range)  predniSONE (DELTASONE) tablet 50 mg (50 mg Oral Given 06/08/19 1605)  diphenhydrAMINE (BENADRYL) injection 25 mg (25 mg Intravenous  Given 06/08/19 1858)  sodium chloride (PF) 0.9 % injection (  Given by Other 06/08/19 2115)  iohexol (OMNIPAQUE) 300 MG/ML solution 75 mL (75 mLs Intravenous Contrast Given 06/08/19 2009)    Mobility walks Low fall risk   Focused Assessments Pulmonary Assessment Handoff:  Lung sounds:   O2 Device: Room Air        R Recommendations: See Admitting Provider Note  Report given to:   Additional Notes:

## 2019-06-09 NOTE — Progress Notes (Signed)
PROGRESS NOTE    Jo Hendrix  DJM:[id number] DOB: [birth year] DOA: 06/08/2019 PCP: Jonathon Jordan, MD   Brief Narrative:  HPI per Dr. Toy Baker on 06/08/2019 Jo Hendrix is a 85 y.o. female with medical history significant of COPD, diastolic CHF, HTN, CAD, pulmonary hypertension, OSA    Presented with shortness of breath and rapid heart rate while in the grocery store today With.  She was feeling just fine this morning.  She did endorse later on a bit of a chest aching 1 out of 10 not associated with exertion currently resolved otherwise no fevers no chills she had a bit of a cough for past few weeks. She called her pulmonologist who recommended trying to use Ambien  Infectious risk factors:  Reports shortness of breath  chest pain    In  ER RAPID COVID TEST   in house testing  Pending  Recent Labs  No results found for: SARSCOV2NAA     Regarding pertinent Chronic problems:     Hyperlipidemia -  on statins Crestor   HTN on Norvasc   CHF diastolic - last echo 10/04/9796 ejection fraction of 60-65%. no increase in left ventricular wall thickness      COPD -  followed by pulmonology not on baseline oxygen     OSA - compliant with CPAP    While in ER: Noted to be slightly elevated D-dimer 0.75/elevated white blood cell count Chest x-ray was worrisome for mass versus pneumonia Patient had to be premedicated for chest CT with contrast that showed right upper lobe lung mass with mediastinal extension postobstructive atelectasis and pneumonitis as well as necrosis second mass in the left lower lobe pneumonitis and necrosis metastatic lymphadenopathy  **Interim History Oncology recommending further work-up and this has been ordered.  Will need to touch base with pulmonary in the a.m. Currently feeling a little bit better.   Assessment & Plan:   Active Problems:   Pulmonary emphysema (HCC)   Complex sleep apnea syndrome   Hyperlipidemia   Essential  hypertension   Palpitations   Lung mass   SOB (shortness of breath)  Lung mass  -patient with known extensive history of tobacco abuse currently in remission some weight loss, intermittent chest pains and shortness of breath was found to have bilateral lung masses most consistent with primary metastatic spread.   -Discussed with patient who states "my understanding I have cancer". -Oncology consulted for further evaluation and Recc's -Oncology recommending complete CT of the abdomen and pelvis with contrast and MRI of the brain with and without contrast -Biopsy review done and recommending pulmonary consideration for bronchoscopy and will need to notify them in the a.m. -We will also obtain a thyroid ultrasound to further evaluate her right thyroid nodule -Further work-up per oncology and appreciate their assistance and help -WBC went from 13.8 -> 14.1  Palpitations/Chest Pain today  -D-dimer age-appropriate no significant EKG changes to suggest ischemia .  -monitor on telemetry currently resolved, troponin within normal limits -CT Chest was done -D-Dimer was 0.75 -Continue to Monitor and May need repeat ECHOCardiogram Order echogram  Pulmonary emphysema ([place])  -chronic patient with history of tobacco abuse currently in remission current  -appears to be stable  Hyperlipidemia  -chronic  -continue home medications with Rosuvastatin   Essential Hypertension  -chronic currently stable and continue Amlodipine  OSA  -Continue CPAP/BiPAP since COVID is negative   Chronic Diastolic CHF -Currently does not look decompensated  -Continue to Monitor Strict I's/O's and Daily  Weights   CKD stage III -Unclear baseline but appears to have a relatively stable BUN/creatinine X-continue to monitor and avoid nephrotoxic medications, contrast dyes as well as hypotension -Repeat CMP in a.m.  DVT prophylaxis: SCDs Code Status: FULL CODE  Family Communication: Discussed with Husband  bedside  Disposition Plan:   Consultants:   Medical Oncology    Procedures:  None  Antimicrobials:  Anti-infectives (From admission, onward)   None     Subjective: Seen and Examined at bedside and states that she is feeling little bit better today than she was yesterday.  No nausea or vomiting.  States that she did not have this same episode that she had when she came in.  Awaiting for oncology to evaluate her.  No other concerns or complaints at this time.  Objective: Vitals:   06/09/19 1209 06/09/19 1407 06/09/19 1501 06/09/19 1620  BP: 129/73 127/67 122/60 (!) 147/70  Pulse: 88 87 87 81  Resp: 18 16 16 18   Temp:    98.1 F (36.7 C)  TempSrc:      SpO2: 96% 94% 95% 96%  Weight:    65 kg  Height:    4\' 11"  (1.499 m)    Intake/Output Summary (Last 24 hours) at 06/09/2019 1844 Last data filed at 06/09/2019 1749 Gross per 24 hour  Intake 11.81 ml  Output 300 ml  Net -288.19 ml   Filed Weights   06/09/19 1620  Weight: 65 kg   Examination: Physical Exam:  Constitutional: WN/WD overweight elderly Caucasian female in NAD and appears calm  Eyes: Lids and conjunctivae normal, sclerae anicteric  ENMT: External Ears, Nose appear normal. Grossly normal hearing.  Neck: Appears normal, supple, no cervical masses, normal ROM, no appreciable thyromegaly; no JVD Respiratory: Diminished to auscultation bilaterally, no wheezing, rales, rhonchi or crackles. Normal respiratory effort and patient is not tachypenic. No accessory muscle use.  Cardiovascular: RRR, no murmurs / rubs / gallops. S1 and S2 auscultated. No extremity edema.  Abdomen: Soft, non-tender, Distended. Bowel sounds positive.  GU: Deferred. Musculoskeletal: No clubbing / cyanosis of digits/nails. No joint deformity upper and lower extremities. .  Skin: No rashes, lesions, ulcers on a limited skin evaluation. No induration; Warm and dry.  Neurologic: CN 2-12 grossly intact with no focal deficits.  Romberg sign and  cerebellar reflexes not assessed.  Psychiatric: Normal judgment and insight. Alert and oriented x 3. Pleasant mood and appropriate affect.   Data Reviewed: I have personally reviewed following labs and imaging studies  CBC: Recent Labs  Lab 06/08/19 1244 06/09/19 1018 06/09/19 1651  WBC 13.5* 13.8* 14.1*  NEUTROABS  --  10.3*  --   HGB 14.2 14.7 13.7  HCT 42.7 44.5 41.5  MCV 88.8 89.0 89.8  PLT 368 375 496   Basic Metabolic Panel: Recent Labs  Lab 06/08/19 1244 06/09/19 1018 06/09/19 1651  NA 134* 136 135  K 3.8 3.7 3.7  CL 100 101 101  CO2 22 23 23   GLUCOSE 119* 111* 119*  BUN 17 18 21   CREATININE 1.05* 1.12* 1.13*  CALCIUM 9.5 9.3 9.0  MG 2.0 2.0 2.0  PHOS  --  3.4 3.5   GFR: Estimated Creatinine Clearance: 27.1 mL/min (A) (by C-G formula based on SCr of 1.13 mg/dL (H)). Liver Function Tests: Recent Labs  Lab 06/09/19 1018 06/09/19 1651  AST 17 14*  ALT 13 12  ALKPHOS 71 73  BILITOT 0.4 0.4  PROT 8.5* 7.5  ALBUMIN 3.9 3.5   No  results for input(s): LIPASE, AMYLASE in the last 168 hours. No results for input(s): AMMONIA in the last 168 hours. Coagulation Profile: No results for input(s): INR, PROTIME in the last 168 hours. Cardiac Enzymes: No results for input(s): CKTOTAL, CKMB, CKMBINDEX, TROPONINI in the last 168 hours. BNP (last 3 results) No results for input(s): PROBNP in the last 8760 hours. HbA1C: No results for input(s): HGBA1C in the last 72 hours. CBG: No results for input(s): GLUCAP in the last 168 hours. Lipid Profile: No results for input(s): CHOL, HDL, LDLCALC, TRIG, CHOLHDL, LDLDIRECT in the last 72 hours. Thyroid Function Tests: Recent Labs    06/09/19 1651  TSH 0.460   Anemia Panel: No results for input(s): VITAMINB12, FOLATE, FERRITIN, TIBC, IRON, RETICCTPCT in the last 72 hours. Sepsis Labs: No results for input(s): PROCALCITON, LATICACIDVEN in the last 168 hours.  Recent Results (from the past 240 hour(s))  SARS  CORONAVIRUS 2 (TAT 6-24 HRS) Nasopharyngeal Nasopharyngeal Swab     Status: None   Collection Time: 06/08/19 10:05 PM   Specimen: Nasopharyngeal Swab  Result Value Ref Range Status   SARS Coronavirus 2 NEGATIVE NEGATIVE Final    Comment: (NOTE) SARS-CoV-2 target nucleic acids are NOT DETECTED. The SARS-CoV-2 RNA is generally detectable in upper and lower respiratory specimens during the acute phase of infection. Negative results do not preclude SARS-CoV-2 infection, do not rule out co-infections with other pathogens, and should not be used as the sole basis for treatment or other patient management decisions. Negative results must be combined with clinical observations, patient history, and epidemiological information. The expected result is Negative. Fact Sheet for Patients: SugarRoll.be Fact Sheet for Healthcare Providers: https://www.woods-mathews.com/ This test is not yet approved or cleared by the [place] FDA and  has been authorized for detection and/or diagnosis of SARS-CoV-2 by FDA under an Emergency Use Authorization (EUA). This EUA will remain  in effect (meaning this test can be used) for the duration of the COVID-19 declaration under Section 56 4(b)(1) of the Act, 21 U.S.C. section 360bbb-3(b)(1), unless the authorization is terminated or revoked sooner. Performed at [facility], [place] [address] [place], [place] [address]     Radiology Studies: Dg Chest 2 View  Result Date: 06/08/2019 CLINICAL DATA:  Chest pain with acute onset shortness of breath and tachycardia today. EXAM: CHEST - 2 VIEW COMPARISON:  03/30/2015 FINDINGS: Lungs are adequately inflated and demonstrate airspace consolidation over the right upper lobe with mild patchy density over the right midlung and left mid to lower lung suggesting multifocal infection. Possible subtle air-fluid level within the consolidation in the posterior right upper lobe.  Cardiomediastinal silhouette and remainder of the exam is unchanged. No evidence of effusion. IMPRESSION: Multifocal airspace process worse over the right upper lobe with possible air-fluid level over the right upper lobe which may be due to cavitary process or abscess. Consider chest CT for further evaluation. Electronically Signed   By: Marin Olp M.D.   On: 06/08/2019 14:53   Ct Chest W Contrast  Result Date: 06/08/2019 CLINICAL DATA:  85 year old with a possible cavitary lesion involving the RIGHT UPPER LOBE on chest x-ray earlier today. She presented with acute chest pain, palpitations and shortness of breath. She has also had a nonproductive cough for the past several weeks EXAM: CT CHEST WITH CONTRAST TECHNIQUE: Multidetector CT imaging of the chest was performed during intravenous contrast administration. CONTRAST:  8mL OMNIPAQUE IOHEXOL 300 MG/ML IV. COMPARISON:  Chest x-ray earlier same day. CTA chest  05/02/2007. FINDINGS: Cardiovascular: Normal heart size. Moderate three-vessel coronary atherosclerosis. Mitral annular calcification. No pericardial effusion. Moderate to severe atherosclerosis involving the thoracic and proximal abdominal aorta without evidence of aneurysm. Mediastinum/Nodes: Mass which is likely centered in the [place] with extension into the mediastinum, estimated measurements of approximately 4 x 4 x 5.5 cm (2/34 and 6/74). RIGHT hilar lymphadenopathy. No mediastinal lymphadenopathy elsewhere. Normal appearing esophagus. Approximate 1.2 cm RIGHT lobe thyroid nodule, not present on the 2008 CT. Lungs/Pleura: Central RIGHT UPPER LOBE lung mass with mediastinal extension as described above. This obstructs segmental RIGHT UPPER LOBE bronchi, causing post-obstructive atelectasis and post-obstructive pneumonitis with necrosis. A second mass is suspected in the SUPERIOR segment LEFT LOWER LOBE measuring approximately 4 x 3 x 4 cm (5/57 and 6/90). There is associated  pneumonitis with necrosis in the SUPERIOR segment. Emphysematous changes in the LEFT UPPER LOBE. No parenchymal nodules or masses in the LEFT lung. Minimal linear scar or atelectasis in the LEFT LOWER LOBE. No pleural effusions. Upper Abdomen: Diverticulosis involving the visualized proximal descending colon without evidence of acute diverticulitis. Benign cortical cysts involving the visualized UPPER poles of both kidneys. No acute abnormalities involving the visualized upper abdomen. Musculoskeletal: Degenerative changes throughout the thoracic spine. No acute findings. IMPRESSION: 1. Central RIGHT UPPER LOBE lung mass with mediastinal extension as described above and associated post-obstructive atelectasis and pneumonitis in the RIGHT UPPER LOBE, associated with necrosis. 2. Second mass is suspected in the superior segment LEFT LOWER LOBE with associated pneumonitis and necrosis. 3. RIGHT hilar lymphadenopathy, likely metastatic. 4. No evidence of metastatic disease elsewhere. 5. Approximate 1.2 cm RIGHT lobe thyroid nodule, not present on the 2008 CT. Thyroid ultrasound may be confirmatory. Non-emergent PET-CT is recommended in further evaluation to confirm the above findings. Aortic Atherosclerosis (ICD10-I70.0) and Emphysema (ICD10-J43.9). Electronically Signed   By: Evangeline Dakin M.D.   On: 06/08/2019 20:35   Scheduled Meds: . amLODipine  10 mg Oral Daily  . cycloSPORINE  1 drop Both Eyes BID  . predniSONE  50 mg Oral Q6H  . rosuvastatin  5 mg Oral Q48H  . sodium chloride flush  3 mL Intravenous Once   Continuous Infusions: . sodium chloride 75 mL/hr at 06/09/19 1739    LOS: 0 days   Kerney Elbe, DO [facility] PAGER is on AMION  If 7PM-7AM, please contact night-coverage [email or web address] Password [facility] 06/09/2019, 6:44 PM

## 2019-06-10 ENCOUNTER — Inpatient Hospital Stay (HOSPITAL_COMMUNITY): Payer: Medicare HMO

## 2019-06-10 ENCOUNTER — Telehealth: Payer: Self-pay | Admitting: Pulmonary Disease

## 2019-06-10 DIAGNOSIS — I361 Nonrheumatic tricuspid (valve) insufficiency: Secondary | ICD-10-CM

## 2019-06-10 DIAGNOSIS — R918 Other nonspecific abnormal finding of lung field: Secondary | ICD-10-CM | POA: Diagnosis not present

## 2019-06-10 DIAGNOSIS — I371 Nonrheumatic pulmonary valve insufficiency: Secondary | ICD-10-CM

## 2019-06-10 DIAGNOSIS — E041 Nontoxic single thyroid nodule: Secondary | ICD-10-CM

## 2019-06-10 LAB — COMPREHENSIVE METABOLIC PANEL
ALT: 13 U/L (ref 0–44)
AST: 14 U/L — ABNORMAL LOW (ref 15–41)
Albumin: 3.3 g/dL — ABNORMAL LOW (ref 3.5–5.0)
Alkaline Phosphatase: 65 U/L (ref 38–126)
Anion gap: 11 (ref 5–15)
BUN: 18 mg/dL (ref 8–23)
CO2: 21 mmol/L — ABNORMAL LOW (ref 22–32)
Calcium: 8.8 mg/dL — ABNORMAL LOW (ref 8.9–10.3)
Chloride: 104 mmol/L (ref 98–111)
Creatinine, Ser: 0.91 mg/dL (ref 0.44–1.00)
GFR calc Af Amer: >60 mL/min (ref >60)
GFR calc non Af Amer: 56 mL/min — ABNORMAL LOW (ref >60)
Glucose, Bld: 145 mg/dL — ABNORMAL HIGH (ref 70–99)
Potassium: 4.2 mmol/L (ref 3.5–5.1)
Sodium: 136 mmol/L (ref 135–145)
Total Bilirubin: 0.3 mg/dL (ref 0.3–1.2)
Total Protein: 7.2 g/dL (ref 6.5–8.1)

## 2019-06-10 LAB — CBC WITH DIFFERENTIAL/PLATELET
Abs Immature Granulocytes: 0.09 10*3/uL — ABNORMAL HIGH (ref 0.00–0.07)
Basophils Absolute: 0 10*3/uL (ref 0.0–0.1)
Basophils Relative: 0 %
Eosinophils Absolute: 0 10*3/uL (ref 0.0–0.5)
Eosinophils Relative: 0 %
HCT: 40.4 % (ref 36.0–46.0)
Hemoglobin: 13.3 g/dL (ref 12.0–15.0)
Immature Granulocytes: 1 %
Lymphocytes Relative: 8 %
Lymphs Abs: 0.9 10*3/uL (ref 0.7–4.0)
MCH: 29.2 pg (ref 26.0–34.0)
MCHC: 32.9 g/dL (ref 30.0–36.0)
MCV: 88.6 fL (ref 80.0–100.0)
Monocytes Absolute: 0.1 10*3/uL (ref 0.1–1.0)
Monocytes Relative: 1 %
Neutro Abs: 10.2 10*3/uL — ABNORMAL HIGH (ref 1.7–7.7)
Neutrophils Relative %: 90 %
Platelets: 339 10*3/uL (ref 150–400)
RBC: 4.56 MIL/uL (ref 3.87–5.11)
RDW: 13.5 % (ref 11.5–15.5)
WBC: 11.4 10*3/uL — ABNORMAL HIGH (ref 4.0–10.5)
nRBC: 0 % (ref 0.0–0.2)

## 2019-06-10 LAB — ECHOCARDIOGRAM COMPLETE
Height: 59 in
Weight: 2292.8 oz

## 2019-06-10 LAB — MAGNESIUM: Magnesium: 2 mg/dL (ref 1.7–2.4)

## 2019-06-10 LAB — PHOSPHORUS: Phosphorus: 3.6 mg/dL (ref 2.5–4.6)

## 2019-06-10 NOTE — TOC Initial Note (Signed)
Transition of Care [facility]) - Initial/Assessment Note    Patient Details  Name: Jo Hendrix MRN: [id number] Date of Birth: [birth year]  Transition of [facility]) CM/SW Contact:    Dessa Phi, RN Phone Number: 06/10/2019, 10:44 AM  Clinical Narrative: Patient recc for tub/shower seat-patient pleasantly declines-she has hand rails & feels she does well with a seat.No further CM needs.                  Expected Discharge Plan: Home/Self Care Barriers to Discharge: Continued Medical Work up   Patient Goals and CMS Choice Patient states their goals for this hospitalization and ongoing recovery are:: go home      Expected Discharge Plan and Services Expected Discharge Plan: Home/Self Care   Discharge Planning Services: CM Consult   Living arrangements for the past 2 months: Single Family Home                                      Prior Living Arrangements/Services Living arrangements for the past 2 months: Single Family Home Lives with:: Adult Children Patient language and need for interpreter reviewed:: Yes Do you feel safe going back to the place where you live?: Yes      Need for Family Participation in Patient Care: No (Comment) Care giver support system in place?: Yes (comment) Current home services: DME(hand rails in bathroom) Criminal Activity/Legal Involvement Pertinent to Current Situation/Hospitalization: No - Comment as needed  Activities of Daily Living Home Assistive Devices/Equipment: Eyeglasses, Hearing aid, BIPAP(right hearing aid) ADL Screening (condition at time of admission) Patient's cognitive ability adequate to safely complete daily activities?: Yes Is the patient deaf or have difficulty hearing?: Yes(wears hearing aid on right) Does the patient have difficulty seeing, even when wearing glasses/contacts?: No Does the patient have difficulty concentrating, remembering, or making decisions?: No Patient able to express need for assistance with  ADLs?: Yes Does the patient have difficulty dressing or bathing?: No Independently performs ADLs?: Yes (appropriate for developmental age) Does the patient have difficulty walking or climbing stairs?: No Weakness of Legs: None Weakness of Arms/Hands: None  Permission Sought/Granted Permission sought to share information with : Case Manager Permission granted to share information with : Yes, Verbal Permission Granted  Share Information with NAMEJuliann Pulse [facility] [phone]           Emotional Assessment Appearance:: Appears stated age Attitude/Demeanor/Rapport: Gracious Affect (typically observed): Accepting Orientation: : Oriented to Self, Oriented to Place, Oriented to  Time, Oriented to Situation Alcohol / Substance Use: Not Applicable Psych Involvement: No (comment)  Admission diagnosis:  Palpitations [R00.2] Lung mass [R91.8] SOB (shortness of breath) [R06.02] Patient Active Problem List   Diagnosis Date Noted  . Lung mass 06/09/2019  . SOB (shortness of breath) 06/09/2019  . Palpitations 06/08/2019  . Dizziness 05/19/2018  . Mild CAD 12/03/2013  . 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 Hyperlipidemia 12/03/2013  . Essential hypertension 12/03/2013  . Dyspnea 03/25/2012  . Pulmonary emphysema ([place]) 03/25/2012   PCP:  Jonathon Jordan, MD Pharmacy:   [place], [place] - [address] [place] [address] Phone: [phone] Fax: [facility] - [place], [place] [address] Phone: [phone] Fax: [phone]     Social Determinants of Health (SDOH) Interventions    Readmission Risk Interventions  No flowsheet data found.

## 2019-06-10 NOTE — Progress Notes (Signed)
  Echocardiogram 2D Echocardiogram has been performed.  Randa Lynn Donie Lemelin 06/10/2019, 9:53 AM

## 2019-06-10 NOTE — Evaluation (Signed)
Occupational Therapy Evaluation Patient Details Name: Jo Hendrix MRN: [id number] DOB: 1926/12/17 Today's Date: 06/10/2019    History of Present Illness Pt is a 85 y.o.femalewith medical history significant ofCOPD, diastolic CHF, HTN, CAD, pulmonary hypertension, OSA who presented with SOB and rapid heart rate. Pt found to have bilateral lung masses most consistent with primary metastatic spread.    Clinical Impression   This 85 y/o female presents with the above. PTA pt reports independence with ADL, iADL and functional mobility. Pt currently performing room level functional mobility, UB and LB ADL with overall minguard assist. Pt on RA with SpO2 >92% post room level activity. Pt intermittently seeking out single UE support with mobility/functional tasks but with no overt LOB noted. She reports her son lives with her and able to assist PRN after discharge (but reports she performs much of the household duties). Pt will benefit from continued acute OT services to further maximize her safety and independence with ADL and mobility prior to discharge. Do not anticipate pt will require follow up OT services. Will follow acutely.    Follow Up Recommendations  No OT follow up;Supervision - Intermittent    Equipment Recommendations  Tub/shower seat    Recommendations for Other Services       Precautions / Restrictions Precautions Precautions: None Restrictions Weight Bearing Restrictions: No      Mobility Bed Mobility               General bed mobility comments: pt received OOB in bathroom with NT  Transfers Overall transfer level: Modified independent Equipment used: None                  Balance Overall balance assessment: Needs assistance Sitting-balance support: Feet supported Sitting balance-Leahy Scale: Good     Standing balance support: Single extremity supported;No upper extremity supported;During functional activity Standing balance-Leahy Scale:  Fair Standing balance comment: pt intermittently reaching out for single UE support                           ADL either performed or assessed with clinical judgement   ADL Overall ADL's : Needs assistance/impaired Eating/Feeding: Independent;Sitting   Grooming: Oral care;Supervision/safety;Standing Grooming Details (indicate cue type and reason): standing at sink in bathroom Upper Body Bathing: Supervision/ safety;Sitting   Lower Body Bathing: Min guard;Sit to/from stand   Upper Body Dressing : Set up;Sitting   Lower Body Dressing: Min guard;Sit to/from stand Lower Body Dressing Details (indicate cue type and reason): pt donning L sock and underwear without assist (already had R sock donned upon entry with NT assist)  Toilet Transfer: Supervision/safety;Min guard;Ambulation Toilet Transfer Details (indicate cue type and reason): simulated via transfer to recliner, room level mobility Toileting- Clothing Manipulation and Hygiene: Min guard;Sit to/from stand       Functional mobility during ADLs: Min guard;Supervision/safety                           Pertinent Vitals/Pain Pain Assessment: No/denies pain     Hand Dominance     Extremity/Trunk Assessment Upper Extremity Assessment Upper Extremity Assessment: Generalized weakness   Lower Extremity Assessment Lower Extremity Assessment: Defer to PT evaluation       Communication Communication Communication: No difficulties   Cognition Arousal/Alertness: Awake/alert Behavior During Therapy: WFL for tasks assessed/performed Overall Cognitive Status: Within Functional Limits for tasks assessed  General Comments       Exercises     Shoulder Instructions      Home Living Family/patient expects to be discharged to:: Private residence Living Arrangements: Children(son) Available Help at Discharge: Family;Available 24 hours/day Type of Home:  House Home Access: Stairs to enter [employer] of Steps: 2 Entrance Stairs-Rails: None Home Layout: One level     Bathroom Shower/Tub: Teacher, early years/pre: Standard     Home Equipment: None          Prior Functioning/Environment Level of Independence: Independent                 OT Problem List: Decreased strength;Decreased activity tolerance;Cardiopulmonary status limiting activity      OT Treatment/Interventions: Self-care/ADL training;Therapeutic exercise;Energy conservation;DME and/or AE instruction;Therapeutic activities;Patient/family education;Balance training    OT Goals(Current goals can be found in the care plan section) Acute Rehab OT Goals Patient Stated Goal: none stated today, pt happy to be OOB in recliner OT Goal Formulation: With patient Time For Goal Achievement: 06/24/19 Potential to Achieve Goals: Good  OT Frequency: Min 2X/week   Barriers to D/C:            Co-evaluation              AM-PAC OT "6 Clicks" Daily Activity     Outcome Measure Help from another person eating meals?: None Help from another person taking care of personal grooming?: None Help from another person toileting, which includes using toliet, bedpan, or urinal?: None Help from another person bathing (including washing, rinsing, drying)?: A Little Help from another person to put on and taking off regular upper body clothing?: None Help from another person to put on and taking off regular lower body clothing?: A Little 6 Click Score: 22   End of Session Nurse Communication: Mobility status  Activity Tolerance: Patient tolerated treatment well Patient left: in chair;with call bell/phone within reach  OT Visit Diagnosis: Muscle weakness (generalized) (M62.81)                Time: 9643-8381 OT Time Calculation (min): 16 min Charges:  OT General Charges $OT Visit: 1 Visit OT Evaluation $OT Eval Moderate Complexity: 1 Mod  Lou Cal, OT [employer] Pager [phone] Office [phone]   Raymondo Band 06/10/2019, 10:27 AM

## 2019-06-10 NOTE — Consult Note (Signed)
NAME:  Jo Hendrix, MRN:  [id number], DOB:  11-13-1926, LOS: 1 ADMISSION DATE:  06/08/2019, CONSULTATION DATE:  06/10/19 REFERRING MD:  Marin Olp, CHIEF COMPLAINT:  SOB   Brief History   85 year old woman with hx of COPD p/w SOB, tachycardia found to have multiple lung masses.  History of present illness   85 year old woman with hx of COPD p/w SOB, tachycardia found to have multiple lung masses.  In retrospect, the patient states she has felt ill over the past year with increasing fatigue and vague feelings of unwellness.  Over past month she has noticed steadily increasing shortness of breath, anorexia, and weight loss.  She is followed in our pulmonary clinic for her obstructive sleep apnea by Dr. Ander Slade.  Patient has a distant history of ~10 pack years of smoking.  She has a parent who was a nonsmoker and died of lung cancer.  Baseline excellent performance status, able to perform all activities of daily living.  She is evaluated with her son at bedside.  Pulmonology was consulted for potential tissue biopsy.  Feb 2020 Echo impaired relaxation Plts okay No blood thinners  Past Medical History  OSA COPD HTN 25 pack year smoking  Significant Hospital Events   10/26 admitted  Consults:  [facility], PCCM  Procedures:  None  Significant Diagnostic Tests:  CTA chest: RLL and RUL mass, adenopathy  Micro Data:  10/26 COVID neg  Antimicrobials:  None   Interim history/subjective:  Consulted  Objective   Blood pressure (!) 143/67, pulse 97, temperature 98.3 F (36.8 C), temperature source Oral, resp. rate 16, height _0  (1.499 m), weight 65 kg, SpO2 96 %.        Intake/Output Summary (Last 24 hours) at 06/10/2019 1144 Last data filed at 06/10/2019 1013 Gross per 24 hour  Intake 1241.81 ml  Output 1200 ml  Net 41.81 ml   Filed Weights   06/09/19 1620  Weight: 65 kg   Examination: GEN: Elderly woman appears younger than stated age 44: Mucous membranes  moist, no thrush CV: Heart sounds are regular, extremities warm PULM: Surprisingly clear, no accessory muscle use GI: Soft, positive bowel sounds EXT: Trace edema, arthritic changes of hands NEURO: Moves all 4 extremities to command PSYCH: Alert and orient x3, excellent insight SKIN: Age-related changes  Resolved Hospital Problem list   NA  Assessment & Plan:  # Likely advanced lung cancer # Abnormal brain MRI- CVA vs. Mets, unclear due to suboptimal study  Long discussion with patient and son regarding utility of biopsy given patient age.  Patient and son are very reasonable regarding this.  She is not interested in traditional chemo or radiation therapy.  She is interested in potentially in targeted therapy if eligible.  If not eligible for targeted therapy, she is interested in hospice.  With this in mind, it is reasonable to proceed with EBUS-guided biopsy of lung mass and mediastinal nodes.  Have scheduled for Friday AM 8:30AM, she should be okay to go home after with f/u in [facility] the following week.  Erskine Emery MD  Labs   CBC: Recent Labs  Lab 06/08/19 1244 06/09/19 1018 06/09/19 1651 06/10/19 0502  WBC 13.5* 13.8* 14.1* 11.4*  NEUTROABS  --  10.3*  --  10.2*  HGB 14.2 14.7 13.7 13.3  HCT 42.7 44.5 41.5 40.4  MCV 88.8 89.0 89.8 88.6  PLT 368 375 337 355    Basic Metabolic Panel: Recent Labs  Lab 06/08/19 1244 06/09/19  1018 06/09/19 1651 06/10/19 0502  NA 134* 136 135 136  K 3.8 3.7 3.7 4.2  CL 100 101 101 104  CO2 _0 21*  GLUCOSE 119* 111* 119* 145*  BUN _1 CREATININE 1.05* 1.12* 1.13* 0.91  CALCIUM 9.5 9.3 9.0 8.8*  MG 2.0 2.0 2.0 2.0  PHOS  --  3.4 3.5 3.6   GFR: Estimated Creatinine Clearance: 33.7 mL/min (by C-G formula based on SCr of 0.91 mg/dL). Recent Labs  Lab 06/08/19 1244 06/09/19 1018 06/09/19 1651 06/10/19 0502  WBC 13.5* 13.8* 14.1* 11.4*    Liver Function Tests: Recent Labs  Lab 06/09/19 1018 06/09/19 1651  06/10/19 0502  AST 17 14* 14*  ALT _2 ALKPHOS 71 73 65  BILITOT 0.4 0.4 0.3  PROT 8.5* 7.5 7.2  ALBUMIN 3.9 3.5 3.3*   No results for input(s): LIPASE, AMYLASE in the last 168 hours. No results for input(s): AMMONIA in the last 168 hours.  ABG    Component Value Date/Time   TCO2 28 07/26/2017 1343     Coagulation Profile: No results for input(s): INR, PROTIME in the last 168 hours.  Cardiac Enzymes: No results for input(s): CKTOTAL, CKMB, CKMBINDEX, TROPONINI in the last 168 hours.  HbA1C: No results found for: HGBA1C  CBG: No results for input(s): GLUCAP in the last 168 hours.  Review of Systems:    Positive Symptoms in bold:  Constitutional fevers, chills, weight loss, fatigue, anorexia, malaise  Eyes decreased vision, double vision, eye irritation  Ears, Nose, Mouth, Throat sore throat, trouble swallowing, sinus congestion  Cardiovascular chest pain, paroxysmal nocturnal dyspnea, lower ext edema, palpitations   Respiratory SOB, cough, DOE, hemoptysis, wheezing  Gastrointestinal nausea, vomiting, diarrhea  Genitourinary burning with urination, trouble urinating  Musculoskeletal joint aches, joint swelling, back pain  Integumentary  rashes, skin lesions  Neurological focal weakness, focal numbness, trouble speaking, headaches  Psychiatric depression, anxiety, confusion  Endocrine polyuria, polydipsia, cold intolerance, heat intolerance  Hematologic abnormal bruising, abnormal bleeding, unexplained nose bleeds  Allergic/Immunologic recurrent infections, hives, swollen lymph nodes    Past Medical History  She,  has a past medical history of ACE-inhibitor cough, COPD (chronic obstructive pulmonary disease) (HCC), Diastolic dysfunction, History of tobacco use, Hypertension, Mild CAD, Mild pulmonary hypertension ([place]), and OSA (obstructive sleep apnea).   Surgical History    Past Surgical History:  Procedure Laterality Date  . benign lesion removed on lung   2000  . BLADDER REPAIR    . CARDIAC CATHETERIZATION  05/20/2007   mild coronary obstructive disease with 20% narrowing in prox LAD, diffuse luminal irregularity of 40-50% in mid RCA (Dr. Corky Downs)  . Cardiopulmonary Met Test  01/28/2012   with PFTs - FEV1 & FEV1/VC WNL, VC WNL, DLCO WNL; abnormal pulmonary response  . Carotid Doppler  04/2011   normal patency  . CARPAL TUNNEL RELEASE     x2  . CATARACT EXTRACTION, BILATERAL    . GALLBLADDER SURGERY    . NM MYOCAR PERF WALL MOTION  09/2011   lexiscan myoview - normal perfusion, EF 77%, low risk  . Renal Doppler  05/2007   normal renal arteries   . TONSILLECTOMY    . TRANSTHORACIC ECHOCARDIOGRAM  2013   EF 50-55%; mild MR; mild TR; mild pulm valve regurg; aortic root sclerosis/calcification  . VAGINAL HYSTERECTOMY    . VARICOSE VEIN SURGERY       Social History   reports that she quit  smoking about 34 years ago. Her smoking use included cigarettes. She has a 12.50 pack-year smoking history. She has never used smokeless tobacco. She reports that she does not drink alcohol or use drugs.   Family History   Her family history includes Breast cancer in her daughter; Cancer in her sister; Lung cancer in her mother; Stroke in her sister; Suicidality in her father.   Allergies Allergies  Allergen Reactions  . Acetaminophen Anaphylaxis and Other (See Comments)    Difficulty urinating Difficulty urinating   . Zetia [Ezetimibe] Other (See Comments)    Chest pressure  . Gabapentin     dizziness  . Hydrochlorothiazide Nausea And Vomiting    Dizziness  . Ace Inhibitors Cough  . Codeine Other (See Comments)    difficulty urinating  . Erythromycin Other (See Comments)    DOESN'T REMEMBER   . Etodolac Nausea Only  . Ivp Dye [Iodinated Diagnostic Agents] Hives    hives     Home Medications  Prior to Admission medications   Medication Sig Start Date End Date Taking? Authorizing Provider  amLODipine (NORVASC) 10 MG tablet Take 1 tablet  (10 mg total) by mouth daily. 03/27/19  Yes Lendon Colonel, NP  aspirin 81 MG tablet Take 81 mg by mouth daily.   Yes [provider]  Calcium 1500 MG tablet Take 1,500 mg by mouth daily.   Yes [provider]  cholecalciferol (VITAMIN D) 1000 UNITS tablet Take 1,000 Units by mouth daily. Take 1 tab daily 12/21/14  Yes [provider]  NON FORMULARY at bedtime. BiPAP   Yes [provider]  Omega-3 Fatty Acids (FISH OIL) 1000 MG CAPS Take 1 capsule by mouth daily.    Yes [provider]  RESTASIS 0.05 % ophthalmic emulsion Place 1 drop into both eyes 2 (two) times daily. Use 1 drop in each eye twice a day 03/15/15  Yes [provider]  zolpidem (AMBIEN) 10 MG tablet Take 0.5 tablets (5 mg total) by mouth at bedtime as needed for sleep. Patient taking differently: Take 10 mg by mouth at bedtime as needed for sleep.  06/02/19 07/02/19 Yes Olalere, Adewale A, MD  rOPINIRole (REQUIP) 0.5 MG tablet Take 1 tablet (0.5 mg total) by mouth at bedtime. Patient not taking: Reported on 06/08/2019 05/21/19   Sherrilyn Rist A, MD  rosuvastatin (CRESTOR) 5 MG tablet TAKE 1 TABLET EVERY OTHER DAY Patient taking differently: Take 5 mg by mouth See admin instructions.  05/08/19   Troy Sine, MD

## 2019-06-10 NOTE — Evaluation (Signed)
Physical Therapy Evaluation Patient Details Name: Jo Hendrix MRN: [id number] DOB: November 12, 1926 Today's Date: 06/10/2019   History of Present Illness  Pt is a 85 y.o. female with medical history significant of COPD, diastolic CHF, HTN, CAD, pulmonary hypertension, OSA who presented with SOB and rapid heart rate. Pt found to have bilateral lung masses most consistent with primary metastatic spread.  Clinical Impression   Pt presents with increased time and effort to perform mobility tasks, mild dynamic standing balance disturbance with higher level balance tasks, and decreased activity tolerance with tachypnea. Pt to benefit from acute PT to address deficits. Pt ambulated hallway distance without AD, mainly limited by tachypnea and mild fatigue. Pt will need no post-acute PT follow-up, PT to continue to follow acutely to maximize pt independence upon d/c home and to progress activity tolerance. PT will continue to follow acutely.      Follow Up Recommendations No PT follow up;Supervision - Intermittent    Equipment Recommendations  None recommended by PT    Recommendations for Other Services       Precautions / Restrictions Precautions Precautions: None Restrictions Weight Bearing Restrictions: No      Mobility  Bed Mobility               General bed mobility comments: pt up in chair upon PT arrival, requesting back to chair after PT session  Transfers Overall transfer level: Modified independent Equipment used: None             General transfer comment: Mod I for increased time to rise, pt with self-steadying upon standing.  Ambulation/Gait Ambulation/Gait assistance: Supervision Gait Distance (Feet): 260 Feet Assistive device: None Gait Pattern/deviations: Step-through pattern;Decreased stride length Gait velocity: decr   General Gait Details: Supervision for safety, increased time for ambulation but pt very steady even with challenges to gait (see balance  section). SpO2 98% upon completing ambulation, with RR up to 40 breaths/min.  Stairs            Wheelchair Mobility    Modified Rankin (Stroke Patients Only)       Balance Overall balance assessment: Needs assistance Sitting-balance support: Feet supported Sitting balance-Leahy Scale: Good     Standing balance support: No upper extremity supported;During functional activity Standing balance-Leahy Scale: Good               High level balance activites: Head turns;Turns;Other (comment) High Level Balance Comments: stepping over objects, horizontal head turning pt with mild gait disturbance but no LOB. 180* turn, vertical head turning, stepping around obstacles WNL.             Pertinent Vitals/Pain Pain Assessment: No/denies pain    Home Living Family/patient expects to be discharged to:: Private residence Living Arrangements: Children(son) Available Help at Discharge: Family;Available 24 hours/day Type of Home: House Home Access: Stairs to enter Entrance Stairs-Rails: None Entrance Stairs-Number of Steps: 2 Home Layout: One level Home Equipment: None      Prior Function Level of Independence: Independent         Comments: pt lives with son, but pt is completely independent with ADLs     Hand Dominance   Dominant Hand: Right    Extremity/Trunk Assessment   Upper Extremity Assessment Upper Extremity Assessment: Defer to OT evaluation    Lower Extremity Assessment Lower Extremity Assessment: Overall WFL for tasks assessed    Cervical / Trunk Assessment Cervical / Trunk Assessment: Normal  Communication   Communication: No difficulties  Cognition Arousal/Alertness:  Awake/alert Behavior During Therapy: WFL for tasks assessed/performed Overall Cognitive Status: Within Functional Limits for tasks assessed                                 General Comments: pt very pleasant      General Comments      Exercises      Assessment/Plan    PT Assessment Patient needs continued PT services  PT Problem List Decreased mobility;Decreased activity tolerance;Decreased balance       PT Treatment Interventions DME instruction;Therapeutic activities;Gait training;Therapeutic exercise;Patient/family education;Balance training;Stair training;Functional mobility training    PT Goals (Current goals can be found in the Care Plan section)  Acute Rehab PT Goals Patient Stated Goal: return to independence at home PT Goal Formulation: With patient Time For Goal Achievement: 06/24/19 Potential to Achieve Goals: Good    Frequency Min 3X/week   Barriers to discharge        Co-evaluation               AM-PAC PT "6 Clicks" Mobility  Outcome Measure Help needed turning from your back to your side while in a flat bed without using bedrails?: None Help needed moving from lying on your back to sitting on the side of a flat bed without using bedrails?: None Help needed moving to and from a bed to a chair (including a wheelchair)?: None Help needed standing up from a chair using your arms (e.g., wheelchair or bedside chair)?: None Help needed to walk in hospital room?: A Little Help needed climbing 3-5 steps with a railing? : A Little 6 Click Score: 22    End of Session Equipment Utilized During Treatment: Gait belt Activity Tolerance: Patient tolerated treatment well Patient left: in chair;with call bell/phone within reach;with family/visitor present Nurse Communication: Mobility status PT Visit Diagnosis: Other abnormalities of gait and mobility (R26.89)    Time: 3888-2800 PT Time Calculation (min) (ACUTE ONLY): 11 min   Charges:   PT Evaluation $PT Eval Low Complexity: 1 Low         Julien Girt, PT Acute Rehabilitation Services Pager [phone]  Office [phone]  Nilam Quakenbush D Binta Statzer 06/10/2019, 3:11 PM

## 2019-06-10 NOTE — Telephone Encounter (Signed)
I last saw her in 2016.  More recently being seen by Dr. Ander Slade.  Will route to Dr. Ander Slade.

## 2019-06-10 NOTE — Progress Notes (Signed)
Patient updated that her bronchoscopy is to be Friday morning at 830am.  Family member at bedside at this time.  Both family member and patient expressed understanding that procedure is Friday morning at 830 AM.  No new orders at this time.  Will continue to monitor.

## 2019-06-10 NOTE — Telephone Encounter (Signed)
FYI Dr. Halford Chessman

## 2019-06-10 NOTE — Progress Notes (Signed)
PROGRESS NOTE    Jo Hendrix  TJQ:[id number] DOB: [birth year] DOA: 06/08/2019 PCP: Jonathon Jordan, MD    Brief Narrative:  HPI per Dr. Toy Baker on 06/08/2019 Jo Buckle Rhudyis a 85 y.o.femalewith medical history significant ofCOPD, diastolic CHF, HTN, CAD, pulmonary hypertension, OSA   Presented withshortness of breath and rapid heart rate while in the grocery store today With. She was feeling just fine this morning. She did endorse later on a bit of a chest aching 1 out of 10 not associated with exertion currently resolved otherwise no fevers no chills she had a bit of a cough for past few weeks. She called her pulmonologist who recommended trying to use Ambien  Infectious risk factors: Reports shortness of breath chest pain  In ER RAPID COVID TEST in house testing Pending  Recent Labs  No results found for: SARSCOV2NAA     Regarding pertinent Chronic problems:   Hyperlipidemia - on statinsCrestor  HTN onNorvasc  CHFdiastolic - last echo 7/62/2633 ejection fraction of 60-65%.no increase in left ventricular wall thickness  COPD- followed by pulmonology noton baseline oxygen   OSA - compliant with CPAP   While in ER: Noted to be slightly elevated D-dimer 0.75/elevated white blood cell count Chest x-ray was worrisome for mass versus pneumonia Patient had to be premedicated for chest CT with contrast that showed right upper lobe lung mass with mediastinal extension postobstructive atelectasis and pneumonitis as well as necrosis second mass in the left lower lobe pneumonitis and necrosis metastatic lymphadenopathy    Assessment & Plan:   Principal Problem:   Lung mass Active Problems:   Pulmonary emphysema (HCC)   Complex sleep apnea syndrome   Hyperlipidemia   Essential hypertension   Palpitations   SOB (shortness of breath)   Thyroid nodule  1 lung mass Patient then presented with some intermittent  chest pain and shortness of breath.  Patient noted to have an extensive history of tobacco abuse currently in remission noted to have some weight loss.  Work-up done including chest CT and chest x-ray found bilateral lung masses consistent with primary metastatic spread.  Oncology consulted and recommended CT abdomen and pelvis as well as MRI of the brain with and without contrast.  CT abdomen and pelvis which were done were negative for metastatic disease involving abdomen/pelvis or bony structures.  MRI brain done was suboptimal with severely motion degraded study which showed a punctuate focus of abnormal diffusion restriction within anterior right parietal lobe may indicate a small acute area of ischemia versus small mets.  2 small foci of contrast enhancement within the left frontal lobe and parietal lobe which may be vascular though small metastases could have this appearance.  Patient with no neurological deficits at this time.  Patient seen by oncology who recommended evaluation by pulmonary for probable bronchoscopy.  Will consult with pulmonary to evaluate for potential bronchoscopy.  Oncology following and appreciate input and recommendations.  2.  Chest pain/palpitations Patient denies any chest pains or palpitations.  Could be secondary to problem #1.  EKG done with no ischemic changes noted.  Cardiac enzymes drawn were within normal limits.  CT chest done worrisome for lung mass.  2D echo pending.  Supportive care.  Follow.  3.  Pulmonary emphysema/COPD Patient with a chronic history of tobacco abuse currently in remission.  Stable.  4.  Hyperlipidemia Continue statin.  5.  Hypertension Continue Norvasc.  6.  OSA CPAP nightly.  7.  Chronic diastolic CHF Euvolemic on examination.  Strict I's and O's.  Daily weights.  8.  Chronic kidney disease stage III Currently stable.  Baseline unclear.  Avoid nephrotoxic medications, contrast dyes as well as significant hypotension.   Follow.  9.  Thyroid nodule Thyroid ultrasound done showed normal-sized thyroid gland with a single distinct thyroid nodule in the right hemithyroid that does not meet criteria for follow-up or FNA.  Outpatient follow-up with PCP.   DVT prophylaxis: SCDs Code Status: DNR Family Communication: Updated patient and son at bedside. Disposition Plan: Likely home once work-up is completed and patient undergone bronchoscopy and when okay with oncology and pulmonary.   Consultants:   Critical care medicine pending  Oncology: Dr. Marin Olp 06/09/2019  Procedures:   CT chest 06/08/2019  CT abdomen and pelvis 06/09/2019  Chest x-ray 06/08/2019  MRI brain 06/09/2019  The echo 06/10/2019  Thyroid ultrasound 06/09/2019  Antimicrobials:  None   Subjective: Sitting up in chair.  Son in the room.  Patient denies any chest pain no shortness of breath.  Patient states she is feeling better than on admission.  Objective: Vitals:   06/09/19 1620 06/10/19 0546 06/10/19 1013 06/10/19 1350  BP: (!) 147/70 134/75 (!) 143/67 134/60  Pulse: 81 87 97 96  Resp: 18 16 16 16   Temp: 98.1 F (36.7 C) 98.3 F (36.8 C) 98.3 F (36.8 C) 98 F (36.7 C)  TempSrc:  Oral Oral Oral  SpO2: 96% 96% 96% 100%  Weight: 65 kg     Height: 4\' 11"  (1.499 m)       Intake/Output Summary (Last 24 hours) at 06/10/2019 1521 Last data filed at 06/10/2019 1013 Gross per 24 hour  Intake 1241.81 ml  Output 1200 ml  Net 41.81 ml   Filed Weights   06/09/19 1620  Weight: 65 kg    Examination:  General exam: Appears calm and comfortable  Respiratory system: Clear to auscultation. Respiratory effort normal. Cardiovascular system: S1 & S2 heard, RRR. No JVD, murmurs, rubs, gallops or clicks. No pedal edema. Gastrointestinal system: Abdomen is nondistended, soft and nontender. No organomegaly or masses felt. Normal bowel sounds heard. Central nervous system: Alert and oriented. No focal neurological  deficits. Extremities: Symmetric 5 x 5 power. Skin: No rashes, lesions or ulcers Psychiatry: Judgement and insight appear normal. Mood & affect appropriate.     Data Reviewed: I have personally reviewed following labs and imaging studies  CBC: Recent Labs  Lab 06/08/19 1244 06/09/19 1018 06/09/19 1651 06/10/19 0502  WBC 13.5* 13.8* 14.1* 11.4*  NEUTROABS  --  10.3*  --  10.2*  HGB 14.2 14.7 13.7 13.3  HCT 42.7 44.5 41.5 40.4  MCV 88.8 89.0 89.8 88.6  PLT 368 375 337 073   Basic Metabolic Panel: Recent Labs  Lab 06/08/19 1244 06/09/19 1018 06/09/19 1651 06/10/19 0502  NA 134* 136 135 136  K 3.8 3.7 3.7 4.2  CL 100 101 101 104  CO2 22 23 23  21*  GLUCOSE 119* 111* 119* 145*  BUN 17 18 21 18   CREATININE 1.05* 1.12* 1.13* 0.91  CALCIUM 9.5 9.3 9.0 8.8*  MG 2.0 2.0 2.0 2.0  PHOS  --  3.4 3.5 3.6   GFR: Estimated Creatinine Clearance: 33.7 mL/min (by C-G formula based on SCr of 0.91 mg/dL). Liver Function Tests: Recent Labs  Lab 06/09/19 1018 06/09/19 1651 06/10/19 0502  AST 17 14* 14*  ALT 13 12 13   ALKPHOS 71 73 65  BILITOT 0.4 0.4 0.3  PROT 8.5* 7.5 7.2  ALBUMIN  3.9 3.5 3.3*   No results for input(s): LIPASE, AMYLASE in the last 168 hours. No results for input(s): AMMONIA in the last 168 hours. Coagulation Profile: No results for input(s): INR, PROTIME in the last 168 hours. Cardiac Enzymes: No results for input(s): CKTOTAL, CKMB, CKMBINDEX, TROPONINI in the last 168 hours. BNP (last 3 results) No results for input(s): PROBNP in the last 8760 hours. HbA1C: No results for input(s): HGBA1C in the last 72 hours. CBG: No results for input(s): GLUCAP in the last 168 hours. Lipid Profile: No results for input(s): CHOL, HDL, LDLCALC, TRIG, CHOLHDL, LDLDIRECT in the last 72 hours. Thyroid Function Tests: Recent Labs    06/09/19 1651  TSH 0.460   Anemia Panel: No results for input(s): VITAMINB12, FOLATE, FERRITIN, TIBC, IRON, RETICCTPCT in the last 72  hours. Sepsis Labs: No results for input(s): PROCALCITON, LATICACIDVEN in the last 168 hours.  Recent Results (from the past 240 hour(s))  SARS CORONAVIRUS 2 (TAT 6-24 HRS) Nasopharyngeal Nasopharyngeal Swab     Status: None   Collection Time: 06/08/19 10:05 PM   Specimen: Nasopharyngeal Swab  Result Value Ref Range Status   SARS Coronavirus 2 NEGATIVE NEGATIVE Final    Comment: (NOTE) SARS-CoV-2 target nucleic acids are NOT DETECTED. The SARS-CoV-2 RNA is generally detectable in upper and lower respiratory specimens during the acute phase of infection. Negative results do not preclude SARS-CoV-2 infection, do not rule out co-infections with other pathogens, and should not be used as the sole basis for treatment or other patient management decisions. Negative results must be combined with clinical observations, patient history, and epidemiological information. The expected result is Negative. Fact Sheet for Patients: SugarRoll.be Fact Sheet for Healthcare Providers: https://www.woods-mathews.com/ This test is not yet approved or cleared by the [place] FDA and  has been authorized for detection and/or diagnosis of SARS-CoV-2 by FDA under an Emergency Use Authorization (EUA). This EUA will remain  in effect (meaning this test can be used) for the duration of the COVID-19 declaration under Section 56 4(b)(1) of the Act, 21 U.S.C. section 360bbb-3(b)(1), unless the authorization is terminated or revoked sooner. Performed at [facility], [place] [address] [employer] [place], [place] [address]          Radiology Studies: Ct Chest W Contrast  Result Date: 06/08/2019 CLINICAL DATA:  85 year old with a possible cavitary lesion involving the RIGHT UPPER LOBE on chest x-ray earlier today. She presented with acute chest pain, palpitations and shortness of breath. She has also had a nonproductive cough for the past several weeks EXAM: CT CHEST  WITH CONTRAST TECHNIQUE: Multidetector CT imaging of the chest was performed during intravenous contrast administration. CONTRAST:  36mL OMNIPAQUE IOHEXOL 300 MG/ML IV. COMPARISON:  Chest x-ray earlier same day. CTA chest 05/02/2007. FINDINGS: Cardiovascular: Normal heart size. Moderate three-vessel coronary atherosclerosis. Mitral annular calcification. No pericardial effusion. Moderate to severe atherosclerosis involving the thoracic and proximal abdominal aorta without evidence of aneurysm. Mediastinum/Nodes: Mass which is likely centered in the [place] with extension into the mediastinum, estimated measurements of approximately 4 x 4 x 5.5 cm (2/34 and 6/74). RIGHT hilar lymphadenopathy. No mediastinal lymphadenopathy elsewhere. Normal appearing esophagus. Approximate 1.2 cm RIGHT lobe thyroid nodule, not present on the 2008 CT. Lungs/Pleura: Central RIGHT UPPER LOBE lung mass with mediastinal extension as described above. This obstructs segmental RIGHT UPPER LOBE bronchi, causing post-obstructive atelectasis and post-obstructive pneumonitis with necrosis. A second mass is suspected in the SUPERIOR segment LEFT LOWER LOBE measuring approximately  4 x 3 x 4 cm (5/57 and 6/90). There is associated pneumonitis with necrosis in the SUPERIOR segment. Emphysematous changes in the LEFT UPPER LOBE. No parenchymal nodules or masses in the LEFT lung. Minimal linear scar or atelectasis in the LEFT LOWER LOBE. No pleural effusions. Upper Abdomen: Diverticulosis involving the visualized proximal descending colon without evidence of acute diverticulitis. Benign cortical cysts involving the visualized UPPER poles of both kidneys. No acute abnormalities involving the visualized upper abdomen. Musculoskeletal: Degenerative changes throughout the thoracic spine. No acute findings. IMPRESSION: 1. Central RIGHT UPPER LOBE lung mass with mediastinal extension as described above and associated post-obstructive  atelectasis and pneumonitis in the RIGHT UPPER LOBE, associated with necrosis. 2. Second mass is suspected in the superior segment LEFT LOWER LOBE with associated pneumonitis and necrosis. 3. RIGHT hilar lymphadenopathy, likely metastatic. 4. No evidence of metastatic disease elsewhere. 5. Approximate 1.2 cm RIGHT lobe thyroid nodule, not present on the 2008 CT. Thyroid ultrasound may be confirmatory. Non-emergent PET-CT is recommended in further evaluation to confirm the above findings. Aortic Atherosclerosis (ICD10-I70.0) and Emphysema (ICD10-J43.9). Electronically Signed   By: Evangeline Dakin M.D.   On: 06/08/2019 20:35   Mr Jeri Cos KV Contrast  Result Date: 06/09/2019 CLINICAL DATA:  Lung cancer initial staging. EXAM: MRI HEAD WITHOUT AND WITH CONTRAST TECHNIQUE: Multiplanar, multiecho pulse sequences of the brain and surrounding structures were obtained without and with intravenous contrast. CONTRAST:  7.19mL GADAVIST GADOBUTROL 1 MMOL/ML IV SOLN COMPARISON:  07/26/2017 brain MRI FINDINGS: The examination is severely degraded by motion. BRAIN: There is a punctate focus of abnormal diffusion restriction within the anterior right parietal lobe (series 3, image 34). Early confluent hyperintense T2-weighted signal of the periventricular and deep white matter, most commonly due to chronic ischemic microangiopathy. The cerebral and cerebellar volume are age-appropriate. There is no hydrocephalus. The midline structures are normal. There are 2 small foci of abnormal contrast enhancement. One is in the left frontal lobe (series 11, image 120) measuring approximately 3 mm. The other is in the paramedian right parietal lobe (series 11, image 108) also measuring 3-4 mm. Assessment for metastatic disease otherwise limited by the degree of motion. VASCULAR: The major intracranial arterial and venous sinus flow voids are normal. Susceptibility-sensitive sequences show no chronic microhemorrhage or superficial siderosis.  SKULL AND UPPER CERVICAL SPINE: Calvarial bone marrow signal is normal. There is no skull base mass. The visualized upper cervical spine and soft tissues are normal. SINUSES/ORBITS: There are no fluid levels or advanced mucosal thickening. The mastoid air cells and middle ear cavities are free of fluid. The orbits are normal. IMPRESSION: 1. Severely motion degraded study, which limits the detection of metastatic lesions. The below findings are nonspecific due to the degree of artifact and repeating the examination should be considered when the patient can be made more comfortable and able to tolerate the length of the study. 2. Punctate focus of abnormal diffusion restriction within the anterior right parietal lobe may indicate a small area of acute ischemia. Small metastases can also cause diffusion restriction, but there is no corresponding contrast enhancement visible. 3. 2 small foci of contrast enhancement within the left frontal lobe and right parietal lobe. These may be vascular, though small metastases could have this appearance. Electronically Signed   By: Ulyses Jarred M.D.   On: 06/09/2019 22:37   Ct Abdomen Pelvis W Contrast  Result Date: 06/10/2019 CLINICAL DATA:  Lung mass seen on recent chest CT. Evaluate for metastatic disease. EXAM: CT  ABDOMEN AND PELVIS WITH CONTRAST TECHNIQUE: Multidetector CT imaging of the abdomen and pelvis was performed using the standard protocol following bolus administration of intravenous contrast. CONTRAST:  9mL OMNIPAQUE IOHEXOL 300 MG/ML  SOLN COMPARISON:  None. FINDINGS: Lower chest: The lung bases are clear of an acute process. Underlying emphysematous changes are noted. Left basilar scarring. The heart is normal in size. No pericardial effusion. Coronary artery and aortic calcifications are noted. Hepatobiliary: No focal hepatic lesions to suggest metastatic disease. The gallbladder is surgically absent and there is associated mild intra and extrahepatic  biliary dilatation. Pancreas: No mass, mass, inflammation or ductal dilatation. Spleen: Normal size.  No focal lesions. Adrenals/Urinary Tract: The adrenal glands are normal. No worrisome lesions. The kidneys demonstrate small simple cysts along with left-sided parapelvic cysts. No worrisome renal lesions or hydronephrosis. The bladder is unremarkable. Stomach/Bowel: The stomach, duodenum, small bowel and colon are grossly normal without oral contrast. No acute inflammatory changes, mass lesions or obstructive findings. The terminal ileum is normal. Scattered colonic diverticulosis most notable in the sigmoid colon area but no findings for acute diverticulitis. Vascular/Lymphatic: Advanced atherosclerotic calcifications involving the aorta and iliac arteries but no aneurysm. The branch vessels are patent. The major venous structures are patent. No mesenteric or retroperitoneal mass or adenopathy. Reproductive: The uterus is surgically absent. Other: No pelvic mass or pelvic adenopathy. No free pelvic fluid collections. No inguinal mass or adenopathy. Musculoskeletal: No significant bony findings. Age related degenerative changes involving the spine and hips with chondrocalcinosis noted. No worrisome bone lesions. IMPRESSION: 1. No CT findings for metastatic disease involving the abdomen/pelvis or bony structures. 2. Status post cholecystectomy with associated mild biliary dilatation. 3. Advanced atherosclerotic calcifications involving the aorta and iliac arteries but no aneurysm or dissection. Electronically Signed   By: Marijo Sanes M.D.   On: 06/10/2019 05:03   US Thyroid  Result Date: 06/10/2019 CLINICAL DATA:  Thyroid nodule EXAM: THYROID ULTRASOUND TECHNIQUE: Ultrasound examination of the thyroid gland and adjacent soft tissues was performed. COMPARISON:  None. FINDINGS: Parenchymal Echotexture: Normal Isthmus: 0.3 cm Right lobe: 3.3 x 1.9 x 1.9 cm Left lobe: 3.6 x 1.3 x 1.2 cm  _________________________________________________________ Estimated total number of nodules >/= 1 cm: 1 Number of spongiform nodules >/=  2 cm not described below (TR1): 0 Number of mixed cystic and solid nodules >/= 1.5 cm not described below (TR2): 0 _________________________________________________________ Nodule # 1: Location: Right; Mid Maximum size: 1.9 cm; Other 2 dimensions: 1.4 x 1.5 cm Composition: mixed cystic and solid (1) Echogenicity: isoechoic (1) Shape: not taller-than-wide (0) Margins: smooth (0) Echogenic foci: none (0) ACR TI-RADS total points: 2. ACR TI-RADS risk category: TR2 (2 points). ACR TI-RADS recommendations: This nodule does NOT meet TI-RADS criteria for biopsy or dedicated follow-up. _________________________________________________________ IMPRESSION: Normal-sized thyroid gland with a single distinct thyroid nodule in the right hemi thyroid that does not meet criteria for follow-up or FNA. The above is in keeping with the ACR TI-RADS recommendations - J Am Coll Radiol 2017;14:587-595. Electronically Signed   By: Constance Holster M.D.   On: 06/10/2019 01:47        Scheduled Meds: . amLODipine  10 mg Oral Daily  . cycloSPORINE  1 drop Both Eyes BID  . rosuvastatin  5 mg Oral Q48H  . sodium chloride flush  3 mL Intravenous Once   Continuous Infusions:   LOS: 1 day    Time spent: 35 minutes    Irine Seal, MD [facility]  If 7PM-7AM,  please contact night-coverage [email or web address] 06/10/2019, 3:21 PM

## 2019-06-11 ENCOUNTER — Telehealth: Payer: Self-pay | Admitting: Hematology

## 2019-06-11 DIAGNOSIS — R918 Other nonspecific abnormal finding of lung field: Secondary | ICD-10-CM | POA: Diagnosis not present

## 2019-06-11 LAB — BASIC METABOLIC PANEL
Anion gap: 7 (ref 5–15)
BUN: 23 mg/dL (ref 8–23)
CO2: 24 mmol/L (ref 22–32)
Calcium: 8.9 mg/dL (ref 8.9–10.3)
Chloride: 103 mmol/L (ref 98–111)
Creatinine, Ser: 0.99 mg/dL (ref 0.44–1.00)
GFR calc Af Amer: 58 mL/min — ABNORMAL LOW (ref >60)
GFR calc non Af Amer: 50 mL/min — ABNORMAL LOW (ref >60)
Glucose, Bld: 87 mg/dL (ref 70–99)
Potassium: 3.9 mmol/L (ref 3.5–5.1)
Sodium: 134 mmol/L — ABNORMAL LOW (ref 135–145)

## 2019-06-11 LAB — CBC
HCT: 40.4 % (ref 36.0–46.0)
Hemoglobin: 13.2 g/dL (ref 12.0–15.0)
MCH: 29.3 pg (ref 26.0–34.0)
MCHC: 32.7 g/dL (ref 30.0–36.0)
MCV: 89.6 fL (ref 80.0–100.0)
Platelets: 358 10*3/uL (ref 150–400)
RBC: 4.51 MIL/uL (ref 3.87–5.11)
RDW: 13.5 % (ref 11.5–15.5)
WBC: 17.1 10*3/uL — ABNORMAL HIGH (ref 4.0–10.5)
nRBC: 0 % (ref 0.0–0.2)

## 2019-06-11 LAB — URINALYSIS, ROUTINE W REFLEX MICROSCOPIC
Bacteria, UA: NONE SEEN
Bilirubin Urine: NEGATIVE
Glucose, UA: NEGATIVE mg/dL
Hgb urine dipstick: NEGATIVE
Ketones, ur: NEGATIVE mg/dL
Nitrite: NEGATIVE
Protein, ur: 30 mg/dL — AB
Specific Gravity, Urine: 1.021 (ref 1.005–1.030)
pH: 5 (ref 5.0–8.0)

## 2019-06-11 MED ORDER — TEMAZEPAM 7.5 MG PO CAPS
7.5000 mg | ORAL_CAPSULE | Freq: Every evening | ORAL | Status: DC | PRN
  Administered 2019-06-11: 7.5 mg via ORAL
  Filled 2019-06-11 (×2): qty 1

## 2019-06-11 NOTE — Plan of Care (Signed)
  Problem: Health Behavior/Discharge Planning: Goal: Ability to manage health-related needs will improve Outcome: Progressing   Problem: Clinical Measurements: Goal: Ability to maintain clinical measurements within normal limits will improve Outcome: Progressing Goal: Diagnostic test results will improve Outcome: Progressing Goal: Respiratory complications will improve Outcome: Progressing Goal: Cardiovascular complication will be avoided Outcome: Progressing   Problem: Coping: Goal: Level of anxiety will decrease Outcome: Progressing   Problem: Elimination: Goal: Will not experience complications related to bowel motility Outcome: Progressing Goal: Will not experience complications related to urinary retention Outcome: Progressing   Problem: Pain Managment: Goal: General experience of comfort will improve Outcome: Progressing   Problem: Safety: Goal: Ability to remain free from injury will improve Outcome: Progressing

## 2019-06-11 NOTE — Progress Notes (Signed)
Ms. Geoffery Spruce is doing pretty well.  Her main complaint has been lack of sleep.  She is on Ambien but she says this does not work.  Maybe we can try her on Restoril.  She has been seen by pulmonary medicine.  She says she is going to have a bronchoscopy tomorrow.  I would think that when she has bronchoscopy, she will be able to go home.  All the test that we can do as an inpatient have been done.  We can get her set up with a PET scan as an outpatient.  She is eating well.  She is having no cough.  There is no shortness of breath.  There is no hemoptysis.  Given that she is quite elderly, but still great shape, I do think that she would do better if she was seen at the [facility].  That is only maybe 5 minutes from her house.  I will make sure that Dr. Maylon Peppers sees her.  I think he actually lives a couple streets away from her.  She had an echocardiogram done on 06/10/2019.  This showed a good ejection fraction of 55-60%.  She had borderline left ventricular hypertrophy.  Hopefully, the pulmonary specialist will be able to get enough tissue for [place] to be able to send off for molecular markers.  This is so critical for her as respect to treatment recommendations.  If she has an actionable mutation, it would be somewhat easier just to give her a pill that instead of having to give her chemotherapy and radiation therapy.  I do find it interesting that her mom had lung cancer and was never a smoker.  Lattie Haw, MD  Psalm 118:14

## 2019-06-11 NOTE — Telephone Encounter (Signed)
Scheduled appt per 10/29 sch message - unable to reach pt. Left message with appt date and time

## 2019-06-11 NOTE — Progress Notes (Signed)
Brief Note  No events, reviewed bronch, consent signed. Would not worry too much about white count, if signs of postobstructive PNA on bronch can send home with 5 days of augmentin.  Call if any questions/concerns.

## 2019-06-11 NOTE — Progress Notes (Signed)
PROGRESS NOTE    Jo Hendrix  VVO:[id number] DOB: 1926/09/08 DOA: 06/08/2019 PCP: Jonathon Jordan, MD    Brief Narrative:  HPI per Dr. Toy Baker on 06/08/2019 Jo Walla Rhudyis a 85 y.o.femalewith medical history significant ofCOPD, diastolic CHF, HTN, CAD, pulmonary hypertension, OSA   Presented withshortness of breath and rapid heart rate while in the grocery store today With. She was feeling just fine this morning. She did endorse later on a bit of a chest aching 1 out of 10 not associated with exertion currently resolved otherwise no fevers no chills she had a bit of a cough for past few weeks. She called her pulmonologist who recommended trying to use Ambien  Infectious risk factors: Reports shortness of breath chest pain  In ER RAPID COVID TEST in house testing Pending  Recent Labs  No results found for: SARSCOV2NAA     Regarding pertinent Chronic problems:   Hyperlipidemia - on statinsCrestor  HTN onNorvasc  CHFdiastolic - last echo 2/69/4854 ejection fraction of 60-65%.no increase in left ventricular wall thickness  COPD- followed by pulmonology noton baseline oxygen   OSA - compliant with CPAP   While in ER: Noted to be slightly elevated D-dimer 0.75/elevated white blood cell count Chest x-ray was worrisome for mass versus pneumonia Patient had to be premedicated for chest CT with contrast that showed right upper lobe lung mass with mediastinal extension postobstructive atelectasis and pneumonitis as well as necrosis second mass in the left lower lobe pneumonitis and necrosis metastatic lymphadenopathy    Assessment & Plan:   Principal Problem:   Lung mass Active Problems:   Pulmonary emphysema (HCC)   Complex sleep apnea syndrome   Hyperlipidemia   Essential hypertension   Palpitations   SOB (shortness of breath)   Thyroid nodule  1 lung mass Patient then presented with some intermittent  chest pain and shortness of breath.  Patient noted to have an extensive history of tobacco abuse currently in remission noted to have some weight loss.  Work-up done including chest CT and chest x-ray found bilateral lung masses consistent with primary metastatic spread.  Oncology consulted and recommended CT abdomen and pelvis as well as MRI of the brain with and without contrast.  CT abdomen and pelvis which were done were negative for metastatic disease involving abdomen/pelvis or bony structures.  MRI brain done was suboptimal with severely motion degraded study which showed a punctuate focus of abnormal diffusion restriction within anterior right parietal lobe may indicate a small acute area of ischemia versus small mets.  2 small foci of contrast enhancement within the left frontal lobe and parietal lobe which may be vascular though small metastases could have this appearance.  Patient with no neurological deficits at this time.  Patient seen by oncology who recommended evaluation by pulmonary for probable bronchoscopy.  Patient seen by pulmonary and patient scheduled for bronchoscopy tomorrow for further evaluation and management.  Oncology and pulmonary following and I appreciate the input and recommendations.  2.  Chest pain/palpitations Patient denies any chest pains or palpitations.  Could be secondary to problem #1.  EKG done with no ischemic changes noted.  Cardiac enzymes drawn were within normal limits.  CT chest done worrisome for lung mass.  2D echo with a EF of 55 to 60%, no wall motion abnormalities.  No further work-up needed at this time.  Continue supportive care.   3.  Pulmonary emphysema/COPD Patient with a chronic history of tobacco abuse currently in remission.  Stable.  4.  Hyperlipidemia Continue statin.  5.  Hypertension Continue Norvasc.  6.  OSA Patient stated unable to tolerate CPAP overnight.  CPAP nightly as needed if patient willing.  7.  Chronic diastolic  CHF Euvolemic on examination.  Strict I's and O's.  Daily weights.  8.  Chronic kidney disease stage III Currently stable.  Baseline unclear.  Avoid nephrotoxic medications, contrast dyes as well as significant hypotension.  Follow.  9.  Thyroid nodule Thyroid ultrasound done showed normal-sized thyroid gland with a single distinct thyroid nodule in the right hemithyroid that does not meet criteria for follow-up or FNA.  Outpatient follow-up with PCP.    10 leukocytosis Questionable etiology.  Likely reactive.  Patient denies any shortness of breath, no chest pain, no dysuria.  Patient afebrile.  Will repeat UA with cultures and sensitivities.  Check blood cultures x2.  We will hold off on antibiotics at this time.  Follow.  Patient for bronchoscopy tomorrow.  DVT prophylaxis: SCDs Code Status: DNR Family Communication: Updated patient and son at bedside. Disposition Plan: Likely home once work-up is completed and patient undergone bronchoscopy and when okay with oncology and pulmonary hopefully tomorrow.   Consultants:   Critical care medicine pending  Oncology: Dr. Marin Olp 06/09/2019  Procedures:   CT chest 06/08/2019  CT abdomen and pelvis 06/09/2019  Chest x-ray 06/08/2019  MRI brain 06/09/2019  The echo 06/10/2019  Thyroid ultrasound 06/09/2019  Antimicrobials:  None   Subjective: Patient sitting up in chair.  Denies any shortness of breath.  No cough.  No chest pain.  No dysuria.  Feeling better than on admission.  Patient stated unable to tolerate CPAP overnight.  Objective: Vitals:   06/10/19 1013 06/10/19 1350 06/10/19 2012 06/11/19 0615  BP: (!) 143/67 134/60 135/75 (!) 118/93  Pulse: 97 96 78 84  Resp: 16 16 16 16   Temp: 98.3 F (36.8 C) 98 F (36.7 C) 98.2 F (36.8 C) 98.3 F (36.8 C)  TempSrc: Oral Oral Oral Oral  SpO2: 96% 100% 92% 96%  Weight:      Height:        Intake/Output Summary (Last 24 hours) at 06/11/2019 1121 Last data filed at  06/11/2019 0619 Gross per 24 hour  Intake 120 ml  Output 700 ml  Net -580 ml   Filed Weights   06/09/19 1620  Weight: 65 kg    Examination:  General exam: NAD. Respiratory system: Clear to auscultation bilaterally.  No wheezes, no crackles, no rhonchi.  Normal respiratory effort. Cardiovascular system: Regular rate rhythm no murmurs rubs or gallops.  No JVD.  No lower extremity edema.  Gastrointestinal system: Abdomen is soft, nontender, nondistended, positive bowel sounds.  No rebound.  No guarding.  Central nervous system: Alert and oriented. No focal neurological deficits. Extremities: Symmetric 5 x 5 power. Skin: No rashes, lesions or ulcers Psychiatry: Judgement and insight appear normal. Mood & affect appropriate.     Data Reviewed: I have personally reviewed following labs and imaging studies  CBC: Recent Labs  Lab 06/08/19 1244 06/09/19 1018 06/09/19 1651 06/10/19 0502 06/11/19 0458  WBC 13.5* 13.8* 14.1* 11.4* 17.1*  NEUTROABS  --  10.3*  --  10.2*  --   HGB 14.2 14.7 13.7 13.3 13.2  HCT 42.7 44.5 41.5 40.4 40.4  MCV 88.8 89.0 89.8 88.6 89.6  PLT 368 375 337 339 094   Basic Metabolic Panel: Recent Labs  Lab 06/08/19 1244 06/09/19 1018 06/09/19 1651 06/10/19 0502 06/11/19 0458  NA  134* 136 135 136 134*  K 3.8 3.7 3.7 4.2 3.9  CL 100 101 101 104 103  CO2 22 23 23  21* 24  GLUCOSE 119* 111* 119* 145* 87  BUN 17 18 21 18 23   CREATININE 1.05* 1.12* 1.13* 0.91 0.99  CALCIUM 9.5 9.3 9.0 8.8* 8.9  MG 2.0 2.0 2.0 2.0  --   PHOS  --  3.4 3.5 3.6  --    GFR: Estimated Creatinine Clearance: 30.9 mL/min (by C-G formula based on SCr of 0.99 mg/dL). Liver Function Tests: Recent Labs  Lab 06/09/19 1018 06/09/19 1651 06/10/19 0502  AST 17 14* 14*  ALT 13 12 13   ALKPHOS 71 73 65  BILITOT 0.4 0.4 0.3  PROT 8.5* 7.5 7.2  ALBUMIN 3.9 3.5 3.3*   No results for input(s): LIPASE, AMYLASE in the last 168 hours. No results for input(s): AMMONIA in the last  168 hours. Coagulation Profile: No results for input(s): INR, PROTIME in the last 168 hours. Cardiac Enzymes: No results for input(s): CKTOTAL, CKMB, CKMBINDEX, TROPONINI in the last 168 hours. BNP (last 3 results) No results for input(s): PROBNP in the last 8760 hours. HbA1C: No results for input(s): HGBA1C in the last 72 hours. CBG: No results for input(s): GLUCAP in the last 168 hours. Lipid Profile: No results for input(s): CHOL, HDL, LDLCALC, TRIG, CHOLHDL, LDLDIRECT in the last 72 hours. Thyroid Function Tests: Recent Labs    06/09/19 1651  TSH 0.460   Anemia Panel: No results for input(s): VITAMINB12, FOLATE, FERRITIN, TIBC, IRON, RETICCTPCT in the last 72 hours. Sepsis Labs: No results for input(s): PROCALCITON, LATICACIDVEN in the last 168 hours.  Recent Results (from the past 240 hour(s))  SARS CORONAVIRUS 2 (TAT 6-24 HRS) Nasopharyngeal Nasopharyngeal Swab     Status: None   Collection Time: 06/08/19 10:05 PM   Specimen: Nasopharyngeal Swab  Result Value Ref Range Status   SARS Coronavirus 2 NEGATIVE NEGATIVE Final    Comment: (NOTE) SARS-CoV-2 target nucleic acids are NOT DETECTED. The SARS-CoV-2 RNA is generally detectable in upper and lower respiratory specimens during the acute phase of infection. Negative results do not preclude SARS-CoV-2 infection, do not rule out co-infections with other pathogens, and should not be used as the sole basis for treatment or other patient management decisions. Negative results must be combined with clinical observations, patient history, and epidemiological information. The expected result is Negative. Fact Sheet for Patients: SugarRoll.be Fact Sheet for Healthcare Providers: https://www.woods-mathews.com/ This test is not yet approved or cleared by the [place] FDA and  has been authorized for detection and/or diagnosis of SARS-CoV-2 by FDA under an Emergency Use Authorization  (EUA). This EUA will remain  in effect (meaning this test can be used) for the duration of the COVID-19 declaration under Section 56 4(b)(1) of the Act, 21 U.S.C. section 360bbb-3(b)(1), unless the authorization is terminated or revoked sooner. Performed at [facility], [place] [address] [place], [place] [address]          Radiology Studies: Mr Jeri Cos NI Contrast  Result Date: 06/09/2019 CLINICAL DATA:  Lung cancer initial staging. EXAM: MRI HEAD WITHOUT AND WITH CONTRAST TECHNIQUE: Multiplanar, multiecho pulse sequences of the brain and surrounding structures were obtained without and with intravenous contrast. CONTRAST:  7.79mL GADAVIST GADOBUTROL 1 MMOL/ML IV SOLN COMPARISON:  07/26/2017 brain MRI FINDINGS: The examination is severely degraded by motion. BRAIN: There is a punctate focus of abnormal diffusion restriction within the anterior right parietal lobe (series  3, image 34). Early confluent hyperintense T2-weighted signal of the periventricular and deep white matter, most commonly due to chronic ischemic microangiopathy. The cerebral and cerebellar volume are age-appropriate. There is no hydrocephalus. The midline structures are normal. There are 2 small foci of abnormal contrast enhancement. One is in the left frontal lobe (series 11, image 120) measuring approximately 3 mm. The other is in the paramedian right parietal lobe (series 11, image 108) also measuring 3-4 mm. Assessment for metastatic disease otherwise limited by the degree of motion. VASCULAR: The major intracranial arterial and venous sinus flow voids are normal. Susceptibility-sensitive sequences show no chronic microhemorrhage or superficial siderosis. SKULL AND UPPER CERVICAL SPINE: Calvarial bone marrow signal is normal. There is no skull base mass. The visualized upper cervical spine and soft tissues are normal. SINUSES/ORBITS: There are no fluid levels or advanced mucosal thickening. The mastoid air cells and  middle ear cavities are free of fluid. The orbits are normal. IMPRESSION: 1. Severely motion degraded study, which limits the detection of metastatic lesions. The below findings are nonspecific due to the degree of artifact and repeating the examination should be considered when the patient can be made more comfortable and able to tolerate the length of the study. 2. Punctate focus of abnormal diffusion restriction within the anterior right parietal lobe may indicate a small area of acute ischemia. Small metastases can also cause diffusion restriction, but there is no corresponding contrast enhancement visible. 3. 2 small foci of contrast enhancement within the left frontal lobe and right parietal lobe. These may be vascular, though small metastases could have this appearance. Electronically Signed   By: Ulyses Jarred M.D.   On: 06/09/2019 22:37   Ct Abdomen Pelvis W Contrast  Result Date: 06/10/2019 CLINICAL DATA:  Lung mass seen on recent chest CT. Evaluate for metastatic disease. EXAM: CT ABDOMEN AND PELVIS WITH CONTRAST TECHNIQUE: Multidetector CT imaging of the abdomen and pelvis was performed using the standard protocol following bolus administration of intravenous contrast. CONTRAST:  35mL OMNIPAQUE IOHEXOL 300 MG/ML  SOLN COMPARISON:  None. FINDINGS: Lower chest: The lung bases are clear of an acute process. Underlying emphysematous changes are noted. Left basilar scarring. The heart is normal in size. No pericardial effusion. Coronary artery and aortic calcifications are noted. Hepatobiliary: No focal hepatic lesions to suggest metastatic disease. The gallbladder is surgically absent and there is associated mild intra and extrahepatic biliary dilatation. Pancreas: No mass, mass, inflammation or ductal dilatation. Spleen: Normal size.  No focal lesions. Adrenals/Urinary Tract: The adrenal glands are normal. No worrisome lesions. The kidneys demonstrate small simple cysts along with left-sided parapelvic  cysts. No worrisome renal lesions or hydronephrosis. The bladder is unremarkable. Stomach/Bowel: The stomach, duodenum, small bowel and colon are grossly normal without oral contrast. No acute inflammatory changes, mass lesions or obstructive findings. The terminal ileum is normal. Scattered colonic diverticulosis most notable in the sigmoid colon area but no findings for acute diverticulitis. Vascular/Lymphatic: Advanced atherosclerotic calcifications involving the aorta and iliac arteries but no aneurysm. The branch vessels are patent. The major venous structures are patent. No mesenteric or retroperitoneal mass or adenopathy. Reproductive: The uterus is surgically absent. Other: No pelvic mass or pelvic adenopathy. No free pelvic fluid collections. No inguinal mass or adenopathy. Musculoskeletal: No significant bony findings. Age related degenerative changes involving the spine and hips with chondrocalcinosis noted. No worrisome bone lesions. IMPRESSION: 1. No CT findings for metastatic disease involving the abdomen/pelvis or bony structures. 2. Status post cholecystectomy with  associated mild biliary dilatation. 3. Advanced atherosclerotic calcifications involving the aorta and iliac arteries but no aneurysm or dissection. Electronically Signed   By: Marijo Sanes M.D.   On: 06/10/2019 05:03   US Thyroid  Result Date: 06/10/2019 CLINICAL DATA:  Thyroid nodule EXAM: THYROID ULTRASOUND TECHNIQUE: Ultrasound examination of the thyroid gland and adjacent soft tissues was performed. COMPARISON:  None. FINDINGS: Parenchymal Echotexture: Normal Isthmus: 0.3 cm Right lobe: 3.3 x 1.9 x 1.9 cm Left lobe: 3.6 x 1.3 x 1.2 cm _________________________________________________________ Estimated total number of nodules >/= 1 cm: 1 Number of spongiform nodules >/=  2 cm not described below (TR1): 0 Number of mixed cystic and solid nodules >/= 1.5 cm not described below (TR2): 0  _________________________________________________________ Nodule # 1: Location: Right; Mid Maximum size: 1.9 cm; Other 2 dimensions: 1.4 x 1.5 cm Composition: mixed cystic and solid (1) Echogenicity: isoechoic (1) Shape: not taller-than-wide (0) Margins: smooth (0) Echogenic foci: none (0) ACR TI-RADS total points: 2. ACR TI-RADS risk category: TR2 (2 points). ACR TI-RADS recommendations: This nodule does NOT meet TI-RADS criteria for biopsy or dedicated follow-up. _________________________________________________________ IMPRESSION: Normal-sized thyroid gland with a single distinct thyroid nodule in the right hemi thyroid that does not meet criteria for follow-up or FNA. The above is in keeping with the ACR TI-RADS recommendations - J Am Coll Radiol 2017;14:587-595. Electronically Signed   By: Constance Holster M.D.   On: 06/10/2019 01:47        Scheduled Meds: . amLODipine  10 mg Oral Daily  . cycloSPORINE  1 drop Both Eyes BID  . rosuvastatin  5 mg Oral Q48H  . sodium chloride flush  3 mL Intravenous Once   Continuous Infusions:   LOS: 2 days    Time spent: 35 minutes    Irine Seal, MD [facility]  If 7PM-7AM, please contact night-coverage [email or web address] 06/11/2019, 11:21 AM

## 2019-06-12 ENCOUNTER — Encounter (HOSPITAL_COMMUNITY): Payer: Self-pay

## 2019-06-12 ENCOUNTER — Inpatient Hospital Stay (HOSPITAL_COMMUNITY): Payer: Medicare HMO | Admitting: Anesthesiology

## 2019-06-12 ENCOUNTER — Encounter (HOSPITAL_COMMUNITY)
Admission: EM | Disposition: A | Discharge status: Home / Self Care | Payer: Self-pay | Source: Home / Self Care | Attending: Internal Medicine

## 2019-06-12 DIAGNOSIS — R918 Other nonspecific abnormal finding of lung field: Secondary | ICD-10-CM | POA: Diagnosis not present

## 2019-06-12 HISTORY — PX: VIDEO BRONCHOSCOPY: SHX5072

## 2019-06-12 HISTORY — PX: FINE NEEDLE ASPIRATION: SHX6590

## 2019-06-12 HISTORY — PX: ENDOBRONCHIAL ULTRASOUND: SHX5096

## 2019-06-12 LAB — BASIC METABOLIC PANEL
Anion gap: 8 (ref 5–15)
BUN: 26 mg/dL — ABNORMAL HIGH (ref 8–23)
CO2: 24 mmol/L (ref 22–32)
Calcium: 8.5 mg/dL — ABNORMAL LOW (ref 8.9–10.3)
Chloride: 103 mmol/L (ref 98–111)
Creatinine, Ser: 1.27 mg/dL — ABNORMAL HIGH (ref 0.44–1.00)
GFR calc Af Amer: 43 mL/min — ABNORMAL LOW (ref >60)
GFR calc non Af Amer: 37 mL/min — ABNORMAL LOW (ref >60)
Glucose, Bld: 88 mg/dL (ref 70–99)
Potassium: 4 mmol/L (ref 3.5–5.1)
Sodium: 135 mmol/L (ref 135–145)

## 2019-06-12 LAB — URINE CULTURE: Culture: <10000 — AB

## 2019-06-12 LAB — CBC WITH DIFFERENTIAL/PLATELET
Abs Immature Granulocytes: 0.11 10*3/uL — ABNORMAL HIGH (ref 0.00–0.07)
Basophils Absolute: 0.1 10*3/uL (ref 0.0–0.1)
Basophils Relative: 0 %
Eosinophils Absolute: 0.2 10*3/uL (ref 0.0–0.5)
Eosinophils Relative: 2 %
HCT: 40.8 % (ref 36.0–46.0)
Hemoglobin: 13.3 g/dL (ref 12.0–15.0)
Immature Granulocytes: 1 %
Lymphocytes Relative: 20 %
Lymphs Abs: 2.6 10*3/uL (ref 0.7–4.0)
MCH: 29.4 pg (ref 26.0–34.0)
MCHC: 32.6 g/dL (ref 30.0–36.0)
MCV: 90.3 fL (ref 80.0–100.0)
Monocytes Absolute: 1.3 10*3/uL — ABNORMAL HIGH (ref 0.1–1.0)
Monocytes Relative: 10 %
Neutro Abs: 8.9 10*3/uL — ABNORMAL HIGH (ref 1.7–7.7)
Neutrophils Relative %: 67 %
Platelets: 349 10*3/uL (ref 150–400)
RBC: 4.52 MIL/uL (ref 3.87–5.11)
RDW: 13.6 % (ref 11.5–15.5)
WBC: 13.2 10*3/uL — ABNORMAL HIGH (ref 4.0–10.5)
nRBC: 0 % (ref 0.0–0.2)

## 2019-06-12 SURGERY — ENDOBRONCHIAL ULTRASOUND (EBUS)
Anesthesia: General

## 2019-06-12 MED ORDER — ROCURONIUM BROMIDE 100 MG/10ML IV SOLN
INTRAVENOUS | Status: DC | PRN
  Administered 2019-06-12: 30 mg via INTRAVENOUS

## 2019-06-12 MED ORDER — FENTANYL CITRATE (PF) 100 MCG/2ML IJ SOLN
INTRAMUSCULAR | Status: DC | PRN
  Administered 2019-06-12: 25 ug via INTRAVENOUS
  Administered 2019-06-12: 100 ug via INTRAVENOUS

## 2019-06-12 MED ORDER — LACTATED RINGERS IV SOLN
INTRAVENOUS | Status: DC
  Administered 2019-06-12: 1000 mL via INTRAVENOUS

## 2019-06-12 MED ORDER — LIDOCAINE HCL (CARDIAC) PF 100 MG/5ML IV SOSY
PREFILLED_SYRINGE | INTRAVENOUS | Status: DC | PRN
  Administered 2019-06-12: 60 mg via INTRAVENOUS

## 2019-06-12 MED ORDER — PROPOFOL 10 MG/ML IV BOLUS
INTRAVENOUS | Status: AC
  Filled 2019-06-12: qty 20

## 2019-06-12 MED ORDER — DEXAMETHASONE SODIUM PHOSPHATE 10 MG/ML IJ SOLN
INTRAMUSCULAR | Status: DC | PRN
  Administered 2019-06-12: 4 mg via INTRAVENOUS

## 2019-06-12 MED ORDER — LIDOCAINE HCL (PF) 2 % IJ SOLN
2.0000 mL | Freq: Once | INTRAMUSCULAR | Status: AC
  Administered 2019-06-12: 2 mL
  Filled 2019-06-12: qty 2

## 2019-06-12 MED ORDER — FENTANYL CITRATE (PF) 100 MCG/2ML IJ SOLN
INTRAMUSCULAR | Status: AC
  Filled 2019-06-12: qty 2

## 2019-06-12 MED ORDER — TEMAZEPAM 7.5 MG PO CAPS: 7.5000 mg | ORAL_CAPSULE | Freq: Every evening | ORAL | 0 refills | Status: DC | PRN

## 2019-06-12 MED ORDER — SUGAMMADEX SODIUM 200 MG/2ML IV SOLN
INTRAVENOUS | Status: DC | PRN
  Administered 2019-06-12: 150 mg via INTRAVENOUS

## 2019-06-12 MED ORDER — HYDROCOD POLST-CPM POLST ER 10-8 MG/5ML PO SUER
5.0000 mL | Freq: Two times a day (BID) | ORAL | Status: DC | PRN
  Administered 2019-06-12: 5 mL via ORAL
  Filled 2019-06-12: qty 5

## 2019-06-12 MED ORDER — PHENYLEPHRINE HCL (PRESSORS) 10 MG/ML IV SOLN
INTRAVENOUS | Status: DC | PRN
  Administered 2019-06-12 (×2): 80 ug via INTRAVENOUS

## 2019-06-12 MED ORDER — ONDANSETRON HCL 4 MG/2ML IJ SOLN
INTRAMUSCULAR | Status: DC | PRN
  Administered 2019-06-12: 4 mg via INTRAVENOUS

## 2019-06-12 MED ORDER — HYDROCOD POLST-CPM POLST ER 10-8 MG/5ML PO SUER: 5.0000 mL | Freq: Two times a day (BID) | ORAL | 0 refills | Status: DC | PRN

## 2019-06-12 NOTE — Telephone Encounter (Signed)
Aware Looked her up Had bronch this Am, tolerated well

## 2019-06-12 NOTE — Discharge Summary (Signed)
Physician Discharge Summary  Jo Hendrix DXI:[id number] DOB: January 21, 1927 DOA: 06/08/2019  PCP: Jonathon Jordan, MD  Admit date: 06/08/2019 Discharge date: 06/12/2019  Time spent: 60 minutes  Recommendations for Outpatient Follow-up:  1. Follow-up with Dr. Maylon Peppers, medical oncology on November 4 at 12:30 PM. 2. Will follow-up with Dr. Gala Murdoch, pulmonary 06/22/2019 at 2:15 PM   Discharge Diagnoses:  Principal Problem:   Lung mass Active Problems:   Pulmonary emphysema (HCC)   Complex sleep apnea syndrome   Hyperlipidemia   Essential hypertension   Palpitations   SOB (shortness of breath)   Thyroid nodule   Discharge Condition: Stable and improved  Diet recommendation: Regular  Filed Weights   06/09/19 1620  Weight: 65 kg    History of present illness:  HPI per Dr. Ellin Saba Jo Hendrix is a 85 y.o. female with medical history significant of COPD, diastolic CHF, HTN, CAD, pulmonary hypertension, OSA Presented with shortness of breath and rapid heart rate while in the grocery store today With.  She was feeling just fine this morning.  She did endorse later on a bit of a chest aching 1 out of 10 not associated with exertion currently resolved otherwise no fevers no chills she had a bit of a cough for past few weeks. She called her pulmonologist for severe insomnia who recommended trying to use Ambien that is the only new medication she has had recently   Infectious risk factors:  Reports shortness of breath  chest pain    In  ER RAPID COVID TEST   in house testing  Pending  Recent Labs  No results found for: SARSCOV2NAA     Regarding pertinent Chronic problems:     Hyperlipidemia -  on statins Crestor   HTN on Norvasc   CHF diastolic - last echo 7/67/3419 ejection fraction of 60-65%. no increase in left ventricular wall thickness      COPD -  followed by pulmonology not on baseline oxygen     OSA - compliant with CPAP    While in ER: Noted to be  slightly elevated D-dimer 0.75/elevated white blood cell count Chest x-ray was worrisome for mass versus pneumonia Patient had to be premedicated for chest CT with contrast that showed right upper lobe lung mass with mediastinal extension postobstructive atelectasis and pneumonitis as well as necrosis second mass in the left lower lobe pneumonitis and necrosis metastatic lymphadenopathy  The following Work up has been ordered so far:   Hospital Course:  1 lung mass/preliminary consistent with non-small cell lung cancer Patient presented with some intermittent chest pain and shortness of breath.  Patient noted to have an extensive history of tobacco abuse currently in remission noted to have some weight loss.  Work-up done including chest CT and chest x-ray found bilateral lung masses consistent with primary metastatic spread.  Oncology consulted and recommended CT abdomen and pelvis as well as MRI of the brain with and without contrast.  CT abdomen and pelvis which were done were negative for metastatic disease involving abdomen/pelvis or bony structures.  MRI brain done was suboptimal with severely motion degraded study which showed a punctuate focus of abnormal diffusion restriction within anterior right parietal lobe may indicate a small acute area of ischemia versus small mets.  2 small foci of contrast enhancement within the left frontal lobe and parietal lobe which may be vascular though small metastases could have this appearance.  Patient with no neurological deficits at this time.  Patient seen by oncology  who recommended evaluation by pulmonary for probable bronchoscopy.  Patient seen by pulmonary and patient scheduled for bronchoscopy which was done on 06/12/2019 which per pulmonary showed findings with preliminary results consistent with non-small cell lung cancer.  Patient scheduled to follow-up in the outpatient setting with pulmonary and oncology.  Patient tolerated procedure well.  Patient  be discharged home in stable condition.  2.  Chest pain/palpitations Patient denied any chest pains or palpitations during the hospitalization.  Could be secondary to problem #1.  EKG done with no ischemic changes noted.  Cardiac enzymes drawn were within normal limits.  CT chest done worrisome for lung mass.  2D echo with a EF of 55 to 60%, no wall motion abnormalities.  No further work-up needed at this time.  See #1.  3.  Pulmonary emphysema/COPD Patient with a chronic history of tobacco abuse currently in remission.  Stable.  4.  Hyperlipidemia Patient maintained on statins.   5.  Hypertension Patient maintained on home regimen of Norvasc.   6.  OSA Patient stated unable to tolerate CPAP during the hospitalization.  Outpatient follow-up.    7.  Chronic diastolic CHF Euvolemic on examination.  Strict I's and O's.  Daily weights.  8.  Chronic kidney disease stage III Remained stable.  Outpatient follow-up.    9.  Thyroid nodule Thyroid ultrasound done showed normal-sized thyroid gland with a single distinct thyroid nodule in the right hemithyroid that does not meet criteria for follow-up or FNA.  Outpatient follow-up with PCP.    10 leukocytosis Questionable etiology.  Likely reactive.  Patient denies any shortness of breath, no chest pain, no dysuria.  Patient afebrile.  Will repeat UA with cultures and sensitivities.  Check blood cultures x2.  We will hold off on antibiotics at this time.  Follow.  Patient for bronchoscopy tomorrow.  Procedures:  CT chest 06/08/2019  CT abdomen and pelvis 06/09/2019  Chest x-ray 06/08/2019  MRI brain 06/09/2019  The echo 06/10/2019  Thyroid ultrasound 06/09/2019  Bronchoscopy: Dr. Tamala Julian, PCCM 06/12/2019  Consultations:  Critical care medicine : Dr. Ina Homes 06/10/2019  Oncology: Dr. Marin Olp 06/09/2019   Discharge Exam: Vitals:   06/12/19 1304 06/12/19 1315  BP: (!) 120/59   Pulse: 79   Resp:    Temp:     SpO2: 92% 93%    General: NAD Cardiovascular: RRR Respiratory: CTAB  Discharge Instructions   Discharge Instructions    Diet general   Complete by: As directed    Increase activity slowly   Complete by: As directed      Allergies as of 06/12/2019      Reactions   Acetaminophen Anaphylaxis, Other (See Comments)   Difficulty urinating Difficulty urinating   Zetia [ezetimibe] Other (See Comments)   Chest pressure   Gabapentin    dizziness   Hydrochlorothiazide Nausea And Vomiting   Dizziness   Ace Inhibitors Cough   Codeine Other (See Comments)   difficulty urinating   Erythromycin Other (See Comments)   DOESN'T REMEMBER    Etodolac Nausea Only   Ivp Dye [iodinated Diagnostic Agents] Hives   hives      Medication List    STOP taking these medications   zolpidem 10 MG tablet Commonly known as: AMBIEN     TAKE these medications   amLODipine 10 MG tablet Commonly known as: NORVASC Take 1 tablet (10 mg total) by mouth daily.   aspirin 81 MG tablet Take 81 mg by mouth daily.   Calcium 1500  MG tablet Take 1,500 mg by mouth daily.   chlorpheniramine-HYDROcodone 10-8 MG/5ML Suer Commonly known as: TUSSIONEX Take 5 mLs by mouth every 12 (twelve) hours as needed for cough (1st dose now).   cholecalciferol 1000 units tablet Commonly known as: VITAMIN D Take 1,000 Units by mouth daily. Take 1 tab daily   Fish Oil 1000 MG Caps Take 1 capsule by mouth daily.   NON FORMULARY at bedtime. BiPAP   Restasis 0.05 % ophthalmic emulsion Generic drug: cycloSPORINE Place 1 drop into both eyes 2 (two) times daily. Use 1 drop in each eye twice a day   rOPINIRole 0.5 MG tablet Commonly known as: Requip Take 1 tablet (0.5 mg total) by mouth at bedtime.   rosuvastatin 5 MG tablet Commonly known as: CRESTOR TAKE 1 TABLET EVERY OTHER DAY What changed: when to take this   temazepam 7.5 MG capsule Commonly known as: RESTORIL Take 1 capsule (7.5 mg total) by mouth at  bedtime as needed for sleep.      Allergies  Allergen Reactions  . Acetaminophen Anaphylaxis and Other (See Comments)    Difficulty urinating Difficulty urinating   . Zetia [Ezetimibe] Other (See Comments)    Chest pressure  . Gabapentin     dizziness  . Hydrochlorothiazide Nausea And Vomiting    Dizziness  . Ace Inhibitors Cough  . Codeine Other (See Comments)    difficulty urinating  . Erythromycin Other (See Comments)    DOESN'T REMEMBER   . Etodolac Nausea Only  . Ivp Dye [Iodinated Diagnostic Agents] Hives    hives   Follow-up Information    Schedule an appointment as soon as possible for a visit  with Jonathon Jordan, MD.   Specialty: Family Medicine Contact information: [address] [place] [address] [phone]        Schedule an appointment as soon as possible for a visit  with Troy Sine, MD.   Specialty: Cardiology Contact information: [address] [place] [address] [phone]        Tish Men, MD Follow up on 06/17/2019.   Specialties: Hematology, Oncology Why: f/u at 1230pm Contact information: [place] [address] [phone]        Laurin Coder, MD Follow up on 06/22/2019.   Specialty: Pulmonary Disease Why: f/u at 215pm Contact information: [place] [address] [phone]            The results of significant diagnostics from this hospitalization (including imaging, microbiology, ancillary and laboratory) are listed below for reference.    Significant Diagnostic Studies: Dg Chest 2 View  Result Date: 06/08/2019 CLINICAL DATA:  Chest pain with acute onset shortness of breath and tachycardia today. EXAM: CHEST - 2 VIEW COMPARISON:  03/30/2015 FINDINGS: Lungs are adequately inflated and demonstrate airspace consolidation over the right upper lobe with mild patchy density over the right midlung and left mid to lower lung suggesting  multifocal infection. Possible subtle air-fluid level within the consolidation in the posterior right upper lobe. Cardiomediastinal silhouette and remainder of the exam is unchanged. No evidence of effusion. IMPRESSION: Multifocal airspace process worse over the right upper lobe with possible air-fluid level over the right upper lobe which may be due to cavitary process or abscess. Consider chest CT for further evaluation. Electronically Signed   By: Marin Olp M.D.   On: 06/08/2019 14:53   Ct Chest W Contrast  Result Date: 06/08/2019 CLINICAL  DATA:  85 year old with a possible cavitary lesion involving the RIGHT UPPER LOBE on chest x-ray earlier today. She presented with acute chest pain, palpitations and shortness of breath. She has also had a nonproductive cough for the past several weeks EXAM: CT CHEST WITH CONTRAST TECHNIQUE: Multidetector CT imaging of the chest was performed during intravenous contrast administration. CONTRAST:  84mL OMNIPAQUE IOHEXOL 300 MG/ML IV. COMPARISON:  Chest x-ray earlier same day. CTA chest 05/02/2007. FINDINGS: Cardiovascular: Normal heart size. Moderate three-vessel coronary atherosclerosis. Mitral annular calcification. No pericardial effusion. Moderate to severe atherosclerosis involving the thoracic and proximal abdominal aorta without evidence of aneurysm. Mediastinum/Nodes: Mass which is likely centered in the [place] with extension into the mediastinum, estimated measurements of approximately 4 x 4 x 5.5 cm (2/34 and 6/74). RIGHT hilar lymphadenopathy. No mediastinal lymphadenopathy elsewhere. Normal appearing esophagus. Approximate 1.2 cm RIGHT lobe thyroid nodule, not present on the 2008 CT. Lungs/Pleura: Central RIGHT UPPER LOBE lung mass with mediastinal extension as described above. This obstructs segmental RIGHT UPPER LOBE bronchi, causing post-obstructive atelectasis and post-obstructive pneumonitis with necrosis. A second mass is suspected in  the SUPERIOR segment LEFT LOWER LOBE measuring approximately 4 x 3 x 4 cm (5/57 and 6/90). There is associated pneumonitis with necrosis in the SUPERIOR segment. Emphysematous changes in the LEFT UPPER LOBE. No parenchymal nodules or masses in the LEFT lung. Minimal linear scar or atelectasis in the LEFT LOWER LOBE. No pleural effusions. Upper Abdomen: Diverticulosis involving the visualized proximal descending colon without evidence of acute diverticulitis. Benign cortical cysts involving the visualized UPPER poles of both kidneys. No acute abnormalities involving the visualized upper abdomen. Musculoskeletal: Degenerative changes throughout the thoracic spine. No acute findings. IMPRESSION: 1. Central RIGHT UPPER LOBE lung mass with mediastinal extension as described above and associated post-obstructive atelectasis and pneumonitis in the RIGHT UPPER LOBE, associated with necrosis. 2. Second mass is suspected in the superior segment LEFT LOWER LOBE with associated pneumonitis and necrosis. 3. RIGHT hilar lymphadenopathy, likely metastatic. 4. No evidence of metastatic disease elsewhere. 5. Approximate 1.2 cm RIGHT lobe thyroid nodule, not present on the 2008 CT. Thyroid ultrasound may be confirmatory. Non-emergent PET-CT is recommended in further evaluation to confirm the above findings. Aortic Atherosclerosis (ICD10-I70.0) and Emphysema (ICD10-J43.9). Electronically Signed   By: Evangeline Dakin M.D.   On: 06/08/2019 20:35   Mr Jeri Cos NU Contrast  Result Date: 06/09/2019 CLINICAL DATA:  Lung cancer initial staging. EXAM: MRI HEAD WITHOUT AND WITH CONTRAST TECHNIQUE: Multiplanar, multiecho pulse sequences of the brain and surrounding structures were obtained without and with intravenous contrast. CONTRAST:  7.13mL GADAVIST GADOBUTROL 1 MMOL/ML IV SOLN COMPARISON:  07/26/2017 brain MRI FINDINGS: The examination is severely degraded by motion. BRAIN: There is a punctate focus of abnormal diffusion restriction  within the anterior right parietal lobe (series 3, image 34). Early confluent hyperintense T2-weighted signal of the periventricular and deep white matter, most commonly due to chronic ischemic microangiopathy. The cerebral and cerebellar volume are age-appropriate. There is no hydrocephalus. The midline structures are normal. There are 2 small foci of abnormal contrast enhancement. One is in the left frontal lobe (series 11, image 120) measuring approximately 3 mm. The other is in the paramedian right parietal lobe (series 11, image 108) also measuring 3-4 mm. Assessment for metastatic disease otherwise limited by the degree of motion. VASCULAR: The major intracranial arterial and venous sinus flow voids are normal. Susceptibility-sensitive sequences show no chronic microhemorrhage or superficial siderosis. SKULL AND UPPER  CERVICAL SPINE: Calvarial bone marrow signal is normal. There is no skull base mass. The visualized upper cervical spine and soft tissues are normal. SINUSES/ORBITS: There are no fluid levels or advanced mucosal thickening. The mastoid air cells and middle ear cavities are free of fluid. The orbits are normal. IMPRESSION: 1. Severely motion degraded study, which limits the detection of metastatic lesions. The below findings are nonspecific due to the degree of artifact and repeating the examination should be considered when the patient can be made more comfortable and able to tolerate the length of the study. 2. Punctate focus of abnormal diffusion restriction within the anterior right parietal lobe may indicate a small area of acute ischemia. Small metastases can also cause diffusion restriction, but there is no corresponding contrast enhancement visible. 3. 2 small foci of contrast enhancement within the left frontal lobe and right parietal lobe. These may be vascular, though small metastases could have this appearance. Electronically Signed   By: Ulyses Jarred M.D.   On: 06/09/2019 22:37    Ct Abdomen Pelvis W Contrast  Result Date: 06/10/2019 CLINICAL DATA:  Lung mass seen on recent chest CT. Evaluate for metastatic disease. EXAM: CT ABDOMEN AND PELVIS WITH CONTRAST TECHNIQUE: Multidetector CT imaging of the abdomen and pelvis was performed using the standard protocol following bolus administration of intravenous contrast. CONTRAST:  27mL OMNIPAQUE IOHEXOL 300 MG/ML  SOLN COMPARISON:  None. FINDINGS: Lower chest: The lung bases are clear of an acute process. Underlying emphysematous changes are noted. Left basilar scarring. The heart is normal in size. No pericardial effusion. Coronary artery and aortic calcifications are noted. Hepatobiliary: No focal hepatic lesions to suggest metastatic disease. The gallbladder is surgically absent and there is associated mild intra and extrahepatic biliary dilatation. Pancreas: No mass, mass, inflammation or ductal dilatation. Spleen: Normal size.  No focal lesions. Adrenals/Urinary Tract: The adrenal glands are normal. No worrisome lesions. The kidneys demonstrate small simple cysts along with left-sided parapelvic cysts. No worrisome renal lesions or hydronephrosis. The bladder is unremarkable. Stomach/Bowel: The stomach, duodenum, small bowel and colon are grossly normal without oral contrast. No acute inflammatory changes, mass lesions or obstructive findings. The terminal ileum is normal. Scattered colonic diverticulosis most notable in the sigmoid colon area but no findings for acute diverticulitis. Vascular/Lymphatic: Advanced atherosclerotic calcifications involving the aorta and iliac arteries but no aneurysm. The branch vessels are patent. The major venous structures are patent. No mesenteric or retroperitoneal mass or adenopathy. Reproductive: The uterus is surgically absent. Other: No pelvic mass or pelvic adenopathy. No free pelvic fluid collections. No inguinal mass or adenopathy. Musculoskeletal: No significant bony findings. Age related  degenerative changes involving the spine and hips with chondrocalcinosis noted. No worrisome bone lesions. IMPRESSION: 1. No CT findings for metastatic disease involving the abdomen/pelvis or bony structures. 2. Status post cholecystectomy with associated mild biliary dilatation. 3. Advanced atherosclerotic calcifications involving the aorta and iliac arteries but no aneurysm or dissection. Electronically Signed   By: Marijo Sanes M.D.   On: 06/10/2019 05:03   US Thyroid  Result Date: 06/10/2019 CLINICAL DATA:  Thyroid nodule EXAM: THYROID ULTRASOUND TECHNIQUE: Ultrasound examination of the thyroid gland and adjacent soft tissues was performed. COMPARISON:  None. FINDINGS: Parenchymal Echotexture: Normal Isthmus: 0.3 cm Right lobe: 3.3 x 1.9 x 1.9 cm Left lobe: 3.6 x 1.3 x 1.2 cm _________________________________________________________ Estimated total number of nodules >/= 1 cm: 1 Number of spongiform nodules >/=  2 cm not described below (TR1): 0 Number of mixed  cystic and solid nodules >/= 1.5 cm not described below (Bertie): 0 _________________________________________________________ Nodule # 1: Location: Right; Mid Maximum size: 1.9 cm; Other 2 dimensions: 1.4 x 1.5 cm Composition: mixed cystic and solid (1) Echogenicity: isoechoic (1) Shape: not taller-than-wide (0) Margins: smooth (0) Echogenic foci: none (0) ACR TI-RADS total points: 2. ACR TI-RADS risk category: TR2 (2 points). ACR TI-RADS recommendations: This nodule does NOT meet TI-RADS criteria for biopsy or dedicated follow-up. _________________________________________________________ IMPRESSION: Normal-sized thyroid gland with a single distinct thyroid nodule in the right hemi thyroid that does not meet criteria for follow-up or FNA. The above is in keeping with the ACR TI-RADS recommendations - J Am Coll Radiol 2017;14:587-595. Electronically Signed   By: Constance Holster M.D.   On: 06/10/2019 01:47    Microbiology: Recent Results (from the  past 240 hour(s))  SARS CORONAVIRUS 2 (TAT 6-24 HRS) Nasopharyngeal Nasopharyngeal Swab     Status: None   Collection Time: 06/08/19 10:05 PM   Specimen: Nasopharyngeal Swab  Result Value Ref Range Status   SARS Coronavirus 2 NEGATIVE NEGATIVE Final    Comment: (NOTE) SARS-CoV-2 target nucleic acids are NOT DETECTED. The SARS-CoV-2 RNA is generally detectable in upper and lower respiratory specimens during the acute phase of infection. Negative results do not preclude SARS-CoV-2 infection, do not rule out co-infections with other pathogens, and should not be used as the sole basis for treatment or other patient management decisions. Negative results must be combined with clinical observations, patient history, and epidemiological information. The expected result is Negative. Fact Sheet for Patients: SugarRoll.be Fact Sheet for Healthcare Providers: https://www.woods-mathews.com/ This test is not yet approved or cleared by the [place] FDA and  has been authorized for detection and/or diagnosis of SARS-CoV-2 by FDA under an Emergency Use Authorization (EUA). This EUA will remain  in effect (meaning this test can be used) for the duration of the COVID-19 declaration under Section 56 4(b)(1) of the Act, 21 U.S.C. section 360bbb-3(b)(1), unless the authorization is terminated or revoked sooner. Performed at [facility], [place] [address], [place], [place] [address]   Culture, blood (Routine X 2) w Reflex to ID Panel     Status: None (Preliminary result)   Collection Time: 06/11/19  8:56 AM   Specimen: BLOOD  Result Value Ref Range Status   Specimen Description   Final    BLOOD RIGHT ARM Performed at [facility] [address], [place], [place] [address]    Special Requests   Final    BOTTLES DRAWN AEROBIC AND ANAEROBIC Blood Culture adequate volume Performed at [facility] [address] [place], [place] [address]    Culture   Final    NO GROWTH 1 DAY Performed at [facility], [place] [address] [place], [place] [address]    Report Status PENDING  Incomplete  Culture, blood (Routine X 2) w Reflex to ID Panel     Status: None (Preliminary result)   Collection Time: 06/11/19  9:08 AM   Specimen: BLOOD LEFT HAND  Result Value Ref Range Status   Specimen Description   Final    BLOOD LEFT HAND Performed at [facility] [address], [place], [place] [address]    Special Requests   Final    BOTTLES DRAWN AEROBIC ONLY Blood Culture adequate volume Performed at [facility] [address], [place], [place] [address]    Culture   Final  NO GROWTH 1 DAY Performed at [facility], [place] [address], [place], [place] [address]    Report Status PENDING  Incomplete  Culture, Urine     Status: Abnormal   Collection Time: 06/11/19  1:44 PM   Specimen: Urine, Random  Result Value Ref Range Status   Specimen Description   Final    URINE, RANDOM Performed at [facility] [address], [place], [place] [address]    Special Requests   Final    NONE Performed at [facility], [place] [address], [place], [place] [address]    Culture (A)  Final    <10,000 COLONIES/mL INSIGNIFICANT GROWTH Performed at [facility] [address] [place], [place] [address]    Report Status 06/12/2019 FINAL  Final     Labs: Basic Metabolic Panel: Recent Labs  Lab 06/08/19 1244 06/09/19 1018 06/09/19 1651 06/10/19 0502 06/11/19 0458 06/12/19 0448  NA 134* 136 135 136 134* 135  K 3.8 3.7 3.7 4.2 3.9 4.0  CL 100 101 101 104 103 103  CO2 22 23 23  21* 24 24  GLUCOSE 119* 111* 119* 145* 87 88  BUN 17 18 21 18 23  26*  CREATININE 1.05* 1.12* 1.13* 0.91 0.99 1.27*  CALCIUM 9.5 9.3 9.0 8.8* 8.9 8.5*  MG 2.0 2.0 2.0 2.0  --   --   PHOS  --  3.4 3.5 3.6  --   --    Liver Function Tests: Recent  Labs  Lab 06/09/19 1018 06/09/19 1651 06/10/19 0502  AST 17 14* 14*  ALT 13 12 13   ALKPHOS 71 73 65  BILITOT 0.4 0.4 0.3  PROT 8.5* 7.5 7.2  ALBUMIN 3.9 3.5 3.3*   No results for input(s): LIPASE, AMYLASE in the last 168 hours. No results for input(s): AMMONIA in the last 168 hours. CBC: Recent Labs  Lab 06/09/19 1018 06/09/19 1651 06/10/19 0502 06/11/19 0458 06/12/19 0448  WBC 13.8* 14.1* 11.4* 17.1* 13.2*  NEUTROABS 10.3*  --  10.2*  --  8.9*  HGB 14.7 13.7 13.3 13.2 13.3  HCT 44.5 41.5 40.4 40.4 40.8  MCV 89.0 89.8 88.6 89.6 90.3  PLT 375 337 339 358 349   Cardiac Enzymes: No results for input(s): CKTOTAL, CKMB, CKMBINDEX, TROPONINI in the last 168 hours. BNP: BNP (last 3 results) Recent Labs    06/08/19 1244  BNP 24.3    ProBNP (last 3 results) No results for input(s): PROBNP in the last 8760 hours.  CBG: No results for input(s): GLUCAP in the last 168 hours.     Signed:  Irine Seal MD.  [facility] 06/12/2019, 3:23 PM

## 2019-06-12 NOTE — Anesthesia Procedure Notes (Signed)
Procedure Name: Intubation Date/Time: 06/12/2019 9:44 AM Performed by: Glory Buff, CRNA Pre-anesthesia Checklist: Patient identified, Emergency Drugs available, Suction available and Patient being monitored Patient Re-evaluated:Patient Re-evaluated prior to induction Oxygen Delivery Method: Circle system utilized Preoxygenation: Pre-oxygenation with 100% oxygen Induction Type: IV induction Ventilation: Mask ventilation without difficulty Grade View: Grade II Tube type: Oral Tube size: 8.0 mm Number of attempts: 1 Airway Equipment and Method: Stylet and Oral airway Placement Confirmation: ETT inserted through vocal cords under direct vision,  positive ETCO2 and breath sounds checked- equal and bilateral Secured at: 22 cm Tube secured with: Tape Dental Injury: Teeth and Oropharynx as per pre-operative assessment

## 2019-06-12 NOTE — Progress Notes (Signed)
OT Cancellation Note  Patient Details Name: Jo Hendrix MRN: [id number] DOB: 1927-04-20   Cancelled Treatment:    Reason Eval/Treat Not Completed: Patient at procedure or test/ unavailable  Jacob Cicero 06/12/2019, 9:49 AM  Lesle Chris, OTR/L Acute Rehabilitation Services [phone] [facility] pager [phone] office 06/12/2019

## 2019-06-12 NOTE — Telephone Encounter (Signed)
LMTCB

## 2019-06-12 NOTE — Procedures (Signed)
Video Bronchoscopy with Endobronchial Ultrasound Procedure Note  Date of Operation: 06/12/2019  Pre-op Diagnosis: Lung mass  Post-op Diagnosis: Lung mass  Surgeon: Erskine Emery MD, Noe Gens NP  Anesthesia: General endotracheal anesthesia  Operation: Flexible video fiberoptic bronchoscopy with endobronchial ultrasound and biopsies.  Estimated Blood Loss: less than 50   Complications: None immediate  Indications and History: Jo Hendrix is a 85 y.o. female with lung mass.  The risks, benefits, complications, treatment options and expected outcomes were discussed with the patient.  The possibilities of pneumothorax, pneumonia, reaction to medication, pulmonary aspiration, perforation of a viscus, bleeding, failure to diagnose a condition and creating a complication requiring transfusion or operation were discussed with the patient who freely signed the consent.    Description of Procedure: The patient was examined in the preoperative area and history and data from the preprocedure consultation were reviewed. It was deemed appropriate to proceed.  The patient was taken to endo, identified as Jo Hendrix and the procedure verified as Flexible Video Fiberoptic Bronchoscopy.  A Time Out was held and the above information confirmed. After being taken to the operating room general anesthesia was initiated and the patient  was orally intubated. EBUS scope advanced through ETT into airway with following findings: - Abnormal tissue along R mainstem and extending into RUL - Extrinsic compression of RUL all subsegments with some evidence of tumor encroachment into endobronchial tissue - Large mass confirmed on EBUS  23g wang needle then used to biopsy right upper lobe lung mass under direct ultrasound visualization. Prelim path NSCLC.  6 passes performed, 2 for slide, 6 for cell block.  Small amount of bleeding post biopsy suctioned with no active oozing noted.  Will update patient/family this  afternoon.  Erskine Emery MD

## 2019-06-12 NOTE — Transfer of Care (Signed)
Immediate Anesthesia Transfer of Care Note  Patient: Jo Hendrix  Procedure(s) Performed: ENDOBRONCHIAL ULTRASOUND (N/A )  Patient Location: PACU  Anesthesia Type:General  Level of Consciousness: drowsy, patient cooperative and responds to stimulation  Airway & Oxygen Therapy: Patient Spontanous Breathing and Patient connected to face mask oxygen  Post-op Assessment: Report given to RN and Post -op Vital signs reviewed and stable  Post vital signs: Reviewed and stable  Last Vitals:  Vitals Value Taken Time  BP    Temp    Pulse 80 06/12/19 1041  Resp 14 06/12/19 1041  SpO2 100 % 06/12/19 1041  Vitals shown include unvalidated device data.  Last Pain:  Vitals:   06/12/19 0804  TempSrc: Oral  PainSc: 0-No pain         Complications: No apparent anesthesia complications

## 2019-06-12 NOTE — Progress Notes (Signed)
Jo Hendrix is doing well.  She is ready for her bronchoscopy this morning.  I think that she can probably go home after the bronchoscopy.  I do not see any other tests that need to be done with her in the hospital.  The only test that needs to be done is a PET scan and that can be done as an outpatient.  She will see Dr.Zhao in the office on Wednesday, November 4.  Hopefully, biopsies will be back.  Hopefully, there will be enough tissue to send off for molecular markers.  She has had no cough.  She has had no shortness of breath.  She has had little bit of reflux.  All vital signs are stable.  She has temperature 98.  Pulse 82.  Blood pressure 119/64.  Her lungs sound clear bilaterally.  Cardiac exam regular rate and rhythm.  Abdomen is soft.  Again, I suspect that Jo Hendrix has a primary lung cancer.  I know it has been about 35 years since she stopped smoking.  The biopsy will be critical.  It was a pleasure to have followed her in the hospital.  I know that she received outstanding care from all the staff on 4 E.  I thank them for their compassion and professionalism.  Lattie Haw, MD  Proverbs 14:7

## 2019-06-12 NOTE — Anesthesia Postprocedure Evaluation (Signed)
Anesthesia Post Note  Patient: Jo Hendrix  Procedure(s) Performed: ENDOBRONCHIAL ULTRASOUND (N/A ) FINE NEEDLE ASPIRATION     Patient location during evaluation: PACU Anesthesia Type: General Level of consciousness: awake and alert Pain management: pain level controlled Vital Signs Assessment: post-procedure vital signs reviewed and stable Respiratory status: spontaneous breathing, nonlabored ventilation, respiratory function stable and patient connected to nasal cannula oxygen Cardiovascular status: blood pressure returned to baseline and stable Postop Assessment: no apparent nausea or vomiting Anesthetic complications: no    Last Vitals:  Vitals:   06/12/19 1304 06/12/19 1315  BP: (!) 120/59   Pulse: 79   Resp:    Temp:    SpO2: 92% 93%    Last Pain:  Vitals:   06/12/19 1302  TempSrc: Oral  PainSc:                  Tiajuana Amass

## 2019-06-12 NOTE — Progress Notes (Signed)
PT Note: Pt off the unit for procedure. Will check back as schedule permits.  Santiago Glad L. Tamala Julian, [place] Pager [phone] 06/12/2019

## 2019-06-12 NOTE — Progress Notes (Signed)
Seen in f/u for lung mass.  S: No events, ready to go home.  O: vital signs benign.  Exam showing continued poor air movement on R.  Heart sounds regular, ext warm.  A: Lung mass, on imaging actually the two masses connect in the back and have same imaging characteristics, this is all the same process.  P: -Bronch done today -Prelim c/w NSCLC, have sent messages to primary and medonc -Will come by and tell patient, family once she has emerged from [place] -Should be okay for home this afternoon with f/u with medonc to discuss molecular marker testing results and available treatment plans. -Call if any questions or concerns.   Erskine Emery MD Pulm

## 2019-06-12 NOTE — Anesthesia Preprocedure Evaluation (Addendum)
Anesthesia Evaluation  Patient identified by MRN, date of birth, ID band Patient awake    Reviewed: Allergy & Precautions, NPO status , Patient's Chart, lab work & pertinent test results  Airway Mallampati: II  TM Distance: >3 FB Neck ROM: Full    Dental  (+) Dental Advisory Given   Pulmonary shortness of breath, sleep apnea , COPD, former smoker,    breath sounds clear to auscultation       Cardiovascular hypertension, Pt. on medications + CAD   Rhythm:Regular Rate:Normal     Neuro/Psych negative neurological ROS     GI/Hepatic negative GI ROS, Neg liver ROS,   Endo/Other  negative endocrine ROS  Renal/GU negative Renal ROS     Musculoskeletal   Abdominal   Peds  Hematology negative hematology ROS (+)   Anesthesia Other Findings   Reproductive/Obstetrics                             Lab Results  Component Value Date   WBC 13.2 (H) 06/12/2019   HGB 13.3 06/12/2019   HCT 40.8 06/12/2019   MCV 90.3 06/12/2019   PLT 349 06/12/2019   Lab Results  Component Value Date   CREATININE 1.27 (H) 06/12/2019   BUN 26 (H) 06/12/2019   NA 135 06/12/2019   K 4.0 06/12/2019   CL 103 06/12/2019   CO2 24 06/12/2019    Anesthesia Physical Anesthesia Plan  ASA: III  Anesthesia Plan: General   Post-op Pain Management:    Induction: Intravenous  PONV Risk Score and Plan: 3 and Dexamethasone, Ondansetron and Treatment may vary due to age or medical condition  Airway Management Planned: Oral ETT  Additional Equipment:   Intra-op Plan:   Post-operative Plan: Extubation in OR  Informed Consent: I have reviewed the patients History and Physical, chart, labs and discussed the procedure including the risks, benefits and alternatives for the proposed anesthesia with the patient or authorized representative who has indicated his/her understanding and acceptance.     Dental advisory  given  Plan Discussed with: CRNA  Anesthesia Plan Comments:         Anesthesia Quick Evaluation

## 2019-06-15 ENCOUNTER — Telehealth: Payer: Self-pay | Admitting: Pulmonary Disease

## 2019-06-15 ENCOUNTER — Encounter (HOSPITAL_COMMUNITY): Payer: Self-pay | Admitting: Internal Medicine

## 2019-06-15 ENCOUNTER — Encounter: Payer: Self-pay | Admitting: *Deleted

## 2019-06-15 LAB — CYTOLOGY - NON PAP

## 2019-06-15 NOTE — Progress Notes (Signed)
Virtual Visit via Telephone Note   This visit type was conducted due to national recommendations for restrictions regarding the COVID-19 Pandemic (e.g. social distancing) in an effort to limit this patient's exposure and mitigate transmission in our community.  Due to her co-morbid illnesses, this patient is at least at moderate risk for complications without adequate follow up.  This format is felt to be most appropriate for this patient at this time.  The patient did not have access to video technology/had technical difficulties with video requiring transitioning to audio format only (telephone).  All issues noted in this document were discussed and addressed.  No physical exam could be performed with this format.  Please refer to the patient's chart for her  consent to telehealth for [facility].   Date:  06/15/2019   ID:  Jo Hendrix, DOB 09-10-1926, MRN [id number]  Patient Location: Home Provider Location: Home  PCP:  Jonathon Jordan, MD  Cardiologist:  Shelva Majestic, MD  Electrophysiologist:  None   Evaluation Performed:  Follow-Up Visit  Chief Complaint:  [facility] Follow Up  History of Present Illness:    Jo Hendrix is a 85 y.o. female with  for ongoing assessment and management of hypertension, grade 1 diastolic dysfunction, pulmonary hypertension with complex sleep apnea on BiPAP, mild coronary artery disease per cardiac catheterization in 2008, follow-up nuclear medicine stress test in 2016 which was low risk, echocardiogram revealing EF of 60-65% and February 2020.Marland Kitchen  She also has had history of palpitations and dizziness and did wear a monitor in 2018 for a week which did not reveal any significant abnormalities she remained in normal sinus rhythm with isolated unifocal PVCs.  She also has a history of tobacco abuse currently in remission   She was seen last on 12/29/2018 and was without complaints with the exception of fatigue. She was compliant with BiPAP.    Unfortunately,Jo Hendrix was admitted to the hospital in late October with worsening shortness of breath and intermittent chest pain.  CT scan of the chest and chest x-ray revealed bilateral lung masses consistent with primary metastatic spread.  She was seen by oncology and recommended for CT of the abdomen pelvis as well as MRI of the brain with and without contrast.  CT scan of the abdomen and pelvis was negative for metastatic disease.  MRI of the brain was suboptimal was severely motion degraded study but did show punctuate focus of abnormal diffusion restriction within the anterior right parietal lobe and may indicate a small acute area of ischemia versus small mets.  She did have a bronchoscopy on 06/12/2019 per pulmonology which showed findings of preliminary results consistent with non-small cell lung cancer.  The patient is to follow-up with oncology and will be seeing Dr.Zhao at the cancer center at [facility].  Chest pain was felt to be related to lung cancer.  EKG was done with no ischemic changes, cardiac enzymes were negative, and she was ruled out for ACS.  A 2D echocardiogram with LVEF of 55% to 60% was noted with no wall motion abnormalities.  No further cardiac work-up was recommended.  She denies any chest pain, her breathing status has worsened some but she is not requiring any additional oxygen during the day.  She is unable to tolerate CPAP at night.  She has been compliant with her medications which include amlodipine 10 mg.  She does have some mild lower extremity edema in the ankles and feet by the end of the  day.  The patient does not have symptoms concerning for COVID-19 infection (fever, chills, cough, or new shortness of breath).    Past Medical History:  Diagnosis Date  . ACE-inhibitor cough   . COPD (chronic obstructive pulmonary disease) ([place])   . Diastolic dysfunction   . History of tobacco use    smoked 25 years  . Hypertension   . Mild CAD   . Mild pulmonary  hypertension ([place])   . OSA (obstructive sleep apnea)    Sleep Study 04/07/2007 - AHI during total sleep 21.30/hr, during REM 20.16/hr - BiPAP auto servo-ventilation unit   Past Surgical History:  Procedure Laterality Date  . benign lesion removed on lung  2000  . BLADDER REPAIR    . CARDIAC CATHETERIZATION  05/20/2007   mild coronary obstructive disease with 20% narrowing in prox LAD, diffuse luminal irregularity of 40-50% in mid RCA (Dr. Corky Downs)  . Cardiopulmonary Met Test  01/28/2012   with PFTs - FEV1 & FEV1/VC WNL, VC WNL, DLCO WNL; abnormal pulmonary response  . Carotid Doppler  04/2011   normal patency  . CARPAL TUNNEL RELEASE     x2  . CATARACT EXTRACTION, BILATERAL    . GALLBLADDER SURGERY    . NM MYOCAR PERF WALL MOTION  09/2011   lexiscan myoview - normal perfusion, EF 77%, low risk  . Renal Doppler  05/2007   normal renal arteries   . TONSILLECTOMY    . TRANSTHORACIC ECHOCARDIOGRAM  2013   EF 50-55%; mild MR; mild TR; mild pulm valve regurg; aortic root sclerosis/calcification  . VAGINAL HYSTERECTOMY    . VARICOSE VEIN SURGERY       No outpatient medications have been marked as taking for the 06/16/19 encounter (Appointment) with Lendon Colonel, NP.     Allergies:   Acetaminophen, Zetia [ezetimibe], Gabapentin, Hydrochlorothiazide, Ace inhibitors, Codeine, Erythromycin, Etodolac, and Ivp dye [iodinated diagnostic agents]   Social History   Tobacco Use  . Smoking status: Former Smoker    Packs/day: 0.50    Years: 25.00    Pack years: 12.50    Types: Cigarettes    Quit date: 08/13/1984    Years since quitting: 34.8  . Smokeless tobacco: Never Used  Substance Use Topics  . Alcohol use: No  . Drug use: No     Family Hx: The patient's family history includes Breast cancer in her daughter; Cancer in her sister; Lung cancer in her mother; Stroke in her sister; Suicidality in her father.  ROS:   Please see the history of present illness.    All other systems  reviewed and are negative.   Prior CV studies:   The following studies were reviewed today: Echocardiogram 06/10/2019  1. Left ventricular ejection fraction, by visual estimation, is 55 to 60%. The left ventricle has normal function. Normal left ventricular size. Left ventricular septal wall thickness was mildly increased. Normal left ventricular posterior wall  thickness. There is borderline left ventricular hypertrophy.  2. Left ventricular diastolic Doppler parameters are consistent with impaired relaxation pattern of LV diastolic filling.  3. Global right ventricle has normal systolic function.The right ventricular size is normal. No increase in right ventricular wall thickness.  4. Left atrial size was normal.  5. Right atrial size was normal.  6. Trivial pericardial effusion is present.  7. The mitral valve is normal in structure. Trace mitral valve regurgitation. No evidence of mitral stenosis.  8. The tricuspid valve is normal in structure. Tricuspid valve regurgitation is  mild.  9. The aortic valve is normal in structure. Aortic valve regurgitation was not visualized by color flow Doppler. Mild aortic valve sclerosis without stenosis. 10. There is Mild calcification of the aortic valve. 11. There is Mild thickening of the aortic valve. 12. The pulmonic valve was normal in structure. Pulmonic valve regurgitation is mild by color flow Doppler. 13. Mildly elevated pulmonary artery systolic pressure. 14. The inferior vena cava is normal in size with greater than 50% respiratory variability, suggesting right atrial pressure of 3 mmHg.  Labs/Other Tests and Data Reviewed:    EKG:  No ECG reviewed.  Recent Labs: 06/08/2019: B Natriuretic Peptide 24.3 06/09/2019: TSH 0.460 06/10/2019: ALT 13; Magnesium 2.0 06/12/2019: BUN 26; Creatinine, Ser 1.27; Hemoglobin 13.3; Platelets 349; Potassium 4.0; Sodium 135   Recent Lipid Panel Lab Results  Component Value Date/Time   CHOL 214 (H)  05/03/2017 12:29 PM   TRIG 97 05/03/2017 12:29 PM   HDL 63 05/03/2017 12:29 PM   CHOLHDL 3.4 05/03/2017 12:29 PM   CHOLHDL 2.9 01/16/2017 08:17 AM   LDLCALC 132 (H) 05/03/2017 12:29 PM    Wt Readings from Last 3 Encounters:  06/09/19 143 lb 4.8 oz (65 kg)  05/21/19 150 lb 6.4 oz (68.2 kg)  12/29/18 153 lb (69.4 kg)     Objective:    Vital Signs:  There were no vitals taken for this visit.   VITAL SIGNS:  reviewed RESPIRATORY:  Frequent coughing, no dyspnea while talking. NEURO:  alert and oriented x 3, no obvious focal deficit PSYCH:  normal affect  ASSESSMENT & PLAN:    1.Hypertension: BP is well controlled currently. She is medically compliant. No changes in her medications at this time. Echo on 06/10/2019 demonstrated normal EF of 55%-60%, with mildly elevated pulmonary pressures.   2. Newly diagnosed non-small cell lung cancer: Abnormal CT of the chest with bilateral lung masses, and bronchoscopy per pulmonary with preliminary results of non-small cell lung CA. She is to follow up with [place] at [facility], with Dr. Maylon Peppers this week.   3. OSA: Intolerant of CPAP at this time.   COVID-19 Education: The signs and symptoms of COVID-19 were discussed with the patient and how to seek care for testing (follow up with PCP or arrange E-visit).  The importance of social distancing was discussed today.  Time:   Today, I have spent 15  minutes with the patient with telehealth technology discussing the above problems.     Medication Adjustments/Labs and Tests Ordered: Current medicines are reviewed at length with the patient today.  Concerns regarding medicines are outlined above.   Tests Ordered: No orders of the defined types were placed in this encounter.   Medication Changes: No orders of the defined types were placed in this encounter.   Disposition:  Follow up 4 months with Dr. Claiborne Billings (per patient request).   Signed, Phill Myron. [place] Pugh, ANP, AACC   06/15/2019 1:09 PM    [facility]

## 2019-06-15 NOTE — Progress Notes (Signed)
Per Dr Maylon Peppers, [facility] molecular testing sent for:  Specimen [id number] Date of collection 06/12/2019

## 2019-06-15 NOTE — Telephone Encounter (Signed)
Called returned to patient, she states she got a call from [place] this morning. I made her aware that was about her Ambien. She states that has since been resolved she states since then she has been hospitalized and diagnosed with Lung Cancer. I made her aware I was sorry to hear that. Confirmed f/u appt. Voiced understanding. Nothing further needed at this time.

## 2019-06-15 NOTE — Progress Notes (Signed)
[place] OFFICE PROGRESS NOTE  Patient Care Team: Patient, No Pcp Per as PCP - General (General Practice) Troy Sine, MD as PCP - Cardiology (Cardiology)  HEME/ONC OVERVIEW: 1. Adenocarcinoma of the R lung, at least cT4N1 -Late 05/2019:   A large R lung mass extending from the RUL to RLL with mediastinal invasion, R hilar denopathy; no mets in abdomen  Questionable small foci in L frontal and R parietal lobes on MRI brain, limited due to motion degradation   Bronch w/ R lung bx, c/w adenocarcinoma   ASSESSMENT & PLAN:   Adenocarcinoma of the R lung, at least cT4N1 -I reviewed the patient's records in detail, including recent hospitalization records, lab studies, and imaging results -I also independently reviewed the radiologic images of recent CT chest, abdomen/pelvis, and MRI brain, and agree with findings as documented -In summary, patient presented to the ER in late 05/2019 for new onset chest pain, palpitations, and shortness of breath, and CT chest showed a large right lung mass extending from the upper to the lower lobes with mediastinal invasion and right hilar adenopathy.  CT abdomen/pelvis was negative for metastatic disease.  MRI brain was of limited value due to motion degradation, but showed two small questionable foci in the right parietal and left frontal lobes.  Patient underwent bronchoscopy with biopsy of the right lung mass, which showed adenocarcinoma.  Patient was referred to oncology for further evaluation. -I reviewed the imaging results in detail with the patient -In light of the locally advanced lung adenocarcinoma, I have ordered PET scan to assess for any occult metastases -In addition, due to the questionable foci on MRI brain, it is prudent to repeat MRI brain with contrast to rule out metastases, as the presence of intracranial metastases would significantly change the treatment regimen and prognosis -As the previous MRI brain was limited  due to motion degradation, I have prescribe Ativan 0.27m x 1 prior to the MRI brain -Finally, I have requested PD-L1 and molecular studies ([place]) on the lung biopsy to determine any potential targeted therapy options  -Pending the imaging results, we will determine the treatment regimen  Leukocytosis  -Likely reactive in the setting of malignancy -WBC 11.6k today, improving since recent hospitalization -Patient denies any symptoms of infection -We will monitor it for now  Cough -Likely secondary to underlying lung malignancy with mediastinal invasion -Currently on Tussionex with some sedation -I recommended the patient to try OTC Robitussin, which may be less sedating  -I also counseled the patient on some of the concerning symptoms, such as fever, worsening cough/dyspnea, or sputum production, for which she should contact the clinic for further evaluation   Orders Placed This Encounter  Procedures  . MR BRAIN W CONTRAST    Patient will be given a prescription for Ativan prior to MRI brain    Standing Status:   Future    Standing Expiration Date:   06/16/2020    Order Specific Question:   ** REASON FOR EXAM (FREE TEXT)    Answer:   Questionable two foci in the brain on MRI, limited by artifact    Order Specific Question:   If indicated for the ordered procedure, I authorize the administration of contrast media per Radiology protocol    Answer:   Yes    Order Specific Question:   What is the patient's sedation requirement?    Answer:   No Sedation    Order Specific Question:   Does the patient have a  pacemaker or implanted devices?    Answer:   No    Order Specific Question:   Use SRS Protocol?    Answer:   No    Order Specific Question:   Radiology Contrast Protocol - do NOT remove file path    Answer:   \\[email or web address]    Order Specific Question:   Preferred imaging location?    Answer:   [facility] (table limit-350 lbs)  . NM PET Image  Initial (PI) Skull Base To Thigh    Standing Status:   Future    Standing Expiration Date:   06/16/2020    Order Specific Question:   ** REASON FOR EXAM (FREE TEXT)    Answer:   Extensive R lung cancer, rule out mets    Order Specific Question:   If indicated for the ordered procedure, I authorize the administration of a radiopharmaceutical per Radiology protocol    Answer:   Yes    Order Specific Question:   Preferred imaging location?    Answer:   [facility]    Order Specific Question:   Radiology Contrast Protocol - do NOT remove file path    Answer:   \\[email or web address]    All questions were answered. The patient knows to call the clinic with any problems, questions or concerns. No barriers to learning was detected.  A total of more than 40 minutes were spent face-to-face with the patient during this encounter and over half of that time was spent on counseling and coordination of care as outlined above.   Return in 2 weeks for imaging results and clinic appt.  Tish Men, MD 06/17/2019 1:42 PM  CHIEF COMPLAINT: "I still have no energy"  INTERVAL HISTORY: Jo Hendrix presents to the oncology clinic for follow-up after recently recent hospitalization.  She presented to the ER in late 05/2019 for new onset chest pain, palpitations, and shortness of breath, and CT chest showed a large right lung mass extending from the upper to the lower lobes with mediastinal invasion and right hilar adenopathy.  CT abdomen/pelvis was negative for metastatic disease.  MRI brain was of limited value due to motion degradation, but showed two small questionable foci in the right parietal and left frontal lobes.  Patient underwent bronchoscopy with biopsy of the right lung mass, which showed adenocarcinoma.   Patient reports that for the past 6 months, she has had persistent fatigue, but she is still able to do chores around the house and go out with her friends.  She has mild  to moderate stable exertional dyspnea, usually after walking 30-40 feet.  She also reports an aching sensation in the upper chest and back, but denies any radiation of the pain, palpitation or diaphoresis.  She has intermittent dry cough since recent bronchoscopy, but denies any fever, worsening dyspnea, or sputum production.  She denies any other complaint today.   REVIEW OF SYSTEMS:   Constitutional: ( - ) fevers, ( - )  chills , ( - ) night sweats Eyes: ( - ) blurriness of vision, ( - ) double vision, ( - ) watery eyes Ears, nose, mouth, throat, and face: ( - ) mucositis, ( - ) sore throat Respiratory: ( + ) cough, ( + ) dyspnea, ( - ) wheezes Cardiovascular: ( - ) palpitation, ( - ) chest discomfort, ( - ) lower extremity swelling Gastrointestinal:  ( - ) nausea, ( - ) heartburn, ( - ) change in bowel habits Skin: ( - )  abnormal skin rashes Lymphatics: ( - ) new lymphadenopathy, ( - ) easy bruising Neurological: ( - ) numbness, ( - ) tingling, ( - ) new weaknesses Behavioral/Psych: ( - ) mood change, ( - ) new changes  All other systems were reviewed with the patient and are negative.  SUMMARY OF ONCOLOGIC HISTORY: Oncology History  Lung cancer ([place])  06/08/2019 Imaging   CT chest: IMPRESSION: 1. Central RIGHT UPPER LOBE lung mass with mediastinal extension as described above and associated post-obstructive atelectasis and pneumonitis in the RIGHT UPPER LOBE, associated with necrosis. 2. Second mass is suspected in the superior segment LEFT LOWER LOBE with associated pneumonitis and necrosis. 3. RIGHT hilar lymphadenopathy, likely metastatic. 4. No evidence of metastatic disease elsewhere. 5. Approximate 1.2 cm RIGHT lobe thyroid nodule, not present on the 2008 CT. Thyroid ultrasound may be confirmatory.   06/09/2019 Initial Diagnosis   Lung cancer ([place])   06/09/2019 Imaging   MRI brain: IMPRESSION: 1. Severely motion degraded study, which limits the detection of metastatic  lesions. The below findings are nonspecific due to the degree of artifact and repeating the examination should be considered when the patient can be made more comfortable and able to tolerate the length of the study. 2. Punctate focus of abnormal diffusion restriction within the anterior right parietal lobe may indicate a small area of acute ischemia. Small metastases can also cause diffusion restriction, but there is no corresponding contrast enhancement visible. 3. 2 small foci of contrast enhancement within the left frontal lobe and right parietal lobe. These may be vascular, though small metastases could have this appearance.IMPRESSION: 1. Severely motion degraded study, which limits the detection of metastatic lesions. The below findings are nonspecific due to the degree of artifact and repeating the examination should be considered when the patient can be made more comfortable and able to tolerate the length of the study. 2. Punctate focus of abnormal diffusion restriction within the anterior right parietal lobe may indicate a small area of acute ischemia. Small metastases can also cause diffusion restriction, but there is no corresponding contrast enhancement visible. 3. 2 small foci of contrast enhancement within the left frontal lobe and right parietal lobe. These may be vascular, though small metastases could have this appearance.   06/09/2019 Imaging   CT abdomen/pelvis: IMPRESSION: 1. No CT findings for metastatic disease involving the abdomen/pelvis or bony structures. 2. Status post cholecystectomy with associated mild biliary dilatation. 3. Advanced atherosclerotic calcifications involving the aorta and iliac arteries but no aneurysm or dissection.     I have reviewed the past medical history, past surgical history, social history and family history with the patient and they are unchanged from previous note.  ALLERGIES:  is allergic to acetaminophen; zetia  [ezetimibe]; gabapentin; hydrochlorothiazide; ace inhibitors; codeine; erythromycin; etodolac; and ivp dye [iodinated diagnostic agents].  MEDICATIONS:  Current Outpatient Medications  Medication Sig Dispense Refill  . amLODipine (NORVASC) 10 MG tablet Take 1 tablet (10 mg total) by mouth daily. 90 tablet 1  . aspirin 81 MG tablet Take 81 mg by mouth daily.    . Calcium 1500 MG tablet Take 1,500 mg by mouth daily.    . chlorpheniramine-HYDROcodone (TUSSIONEX) 10-8 MG/5ML SUER Take 5 mLs by mouth every 12 (twelve) hours as needed for cough (1st dose now). 140 mL 0  . cholecalciferol (VITAMIN D) 1000 UNITS tablet Take 1,000 Units by mouth daily. Take 1 tab daily    . NON FORMULARY at bedtime. BiPAP    . Omega-3  Fatty Acids (FISH OIL) 1000 MG CAPS Take 1 capsule by mouth daily.     . RESTASIS 0.05 % ophthalmic emulsion Place 1 drop into both eyes 2 (two) times daily. Use 1 drop in each eye twice a day    . rosuvastatin (CRESTOR) 5 MG tablet TAKE 1 TABLET EVERY OTHER DAY (Patient taking differently: Take 5 mg by mouth See admin instructions. ) 45 tablet 1  . LORazepam (ATIVAN) 0.5 MG tablet Take 1 tablet (0.5 mg total) by mouth once for 1 dose. Take 1 hour prior to MRI brain 1 tablet 0  . temazepam (RESTORIL) 7.5 MG capsule Take 1 capsule (7.5 mg total) by mouth at bedtime as needed for sleep. (Patient not taking: Reported on 06/17/2019) 20 capsule 0   No current facility-administered medications for this visit.    Facility-Administered Medications Ordered in Other Visits  Medication Dose Route Frequency Provider Last Rate Last Dose  . aminophylline injection 75 mg  75 mg Intravenous TID PRN Larey Dresser, MD   75 mg at 04/07/15 1105    PHYSICAL EXAMINATION: ECOG PERFORMANCE STATUS: 2 - Symptomatic, <50% confined to bed  Today's Vitals   06/17/19 1248 06/17/19 1251  BP: (!) 166/77   Pulse: (!) 110   Resp: 18   Temp: 98.5 F (36.9 C)   TempSrc: Temporal   SpO2: 95%   Weight: 145 lb  3.2 oz (65.9 kg)   Height: _0  (1.499 m)   PainSc:  0-No pain   Body mass index is 29.33 kg/m.  Filed Weights   06/17/19 1248  Weight: 145 lb 3.2 oz (65.9 kg)    GENERAL: alert, no distress and comfortable, younger than stated age  SKIN: skin color, texture, turgor are normal, no rashes or significant lesions EYES: conjunctiva are pink and non-injected, sclera clear OROPHARYNX: no exudate, no erythema; lips, buccal mucosa, and tongue normal  NECK: supple, non-tender LYMPH:  no palpable lymphadenopathy in the cervical LUNGS: clear to auscultation with normal breathing effort HEART: regular rate & rhythm and no murmurs and no lower extremity edema ABDOMEN: soft, non-tender, non-distended, normal bowel sounds Musculoskeletal: no cyanosis of digits and no clubbing  PSYCH: alert & oriented x 3, fluent speech  LABORATORY DATA:  I have reviewed the data as listed    Component Value Date/Time   NA 136 06/17/2019 1237   NA 140 09/22/2018 1102   K 3.9 06/17/2019 1237   CL 99 06/17/2019 1237   CO2 25 06/17/2019 1237   GLUCOSE 101 (H) 06/17/2019 1237   BUN 10 06/17/2019 1237   BUN 15 09/22/2018 1102   CREATININE 0.94 06/17/2019 1237   CREATININE 1.35 (H) 01/16/2017 0817   CALCIUM 9.0 06/17/2019 1237   PROT 7.1 06/17/2019 1237   PROT 6.8 09/22/2018 1102   ALBUMIN 3.2 (L) 06/17/2019 1237   ALBUMIN 3.9 09/22/2018 1102   AST 12 (L) 06/17/2019 1237   ALT 12 06/17/2019 1237   ALKPHOS 72 06/17/2019 1237   BILITOT 0.4 06/17/2019 1237   GFRNONAA 53 (L) 06/17/2019 1237   GFRAA >60 06/17/2019 1237    No results found for: SPEP, UPEP  Lab Results  Component Value Date   WBC 11.6 (H) 06/17/2019   NEUTROABS 8.3 (H) 06/17/2019   HGB 13.0 06/17/2019   HCT 38.4 06/17/2019   MCV 88.5 06/17/2019   PLT 319 06/17/2019      Chemistry      Component Value Date/Time   NA 136 06/17/2019 1237  NA 140 09/22/2018 1102   K 3.9 06/17/2019 1237   CL 99 06/17/2019 1237   CO2 25  06/17/2019 1237   BUN 10 06/17/2019 1237   BUN 15 09/22/2018 1102   CREATININE 0.94 06/17/2019 1237   CREATININE 1.35 (H) 01/16/2017 0817      Component Value Date/Time   CALCIUM 9.0 06/17/2019 1237   ALKPHOS 72 06/17/2019 1237   AST 12 (L) 06/17/2019 1237   ALT 12 06/17/2019 1237   BILITOT 0.4 06/17/2019 1237       RADIOGRAPHIC STUDIES: I have personally reviewed the radiological images as listed below and agreed with the findings in the report. Dg Chest 2 View  Result Date: 06/08/2019 CLINICAL DATA:  Chest pain with acute onset shortness of breath and tachycardia today. EXAM: CHEST - 2 VIEW COMPARISON:  03/30/2015 FINDINGS: Lungs are adequately inflated and demonstrate airspace consolidation over the right upper lobe with mild patchy density over the right midlung and left mid to lower lung suggesting multifocal infection. Possible subtle air-fluid level within the consolidation in the posterior right upper lobe. Cardiomediastinal silhouette and remainder of the exam is unchanged. No evidence of effusion. IMPRESSION: Multifocal airspace process worse over the right upper lobe with possible air-fluid level over the right upper lobe which may be due to cavitary process or abscess. Consider chest CT for further evaluation. Electronically Signed   By: Marin Olp M.D.   On: 06/08/2019 14:53   Ct Chest W Contrast  Result Date: 06/08/2019 CLINICAL DATA:  85 year old with a possible cavitary lesion involving the RIGHT UPPER LOBE on chest x-ray earlier today. She presented with acute chest pain, palpitations and shortness of breath. She has also had a nonproductive cough for the past several weeks EXAM: CT CHEST WITH CONTRAST TECHNIQUE: Multidetector CT imaging of the chest was performed during intravenous contrast administration. CONTRAST:  10m OMNIPAQUE IOHEXOL 300 MG/ML IV. COMPARISON:  Chest x-ray earlier same day. CTA chest 05/02/2007. FINDINGS: Cardiovascular: Normal heart size. Moderate  three-vessel coronary atherosclerosis. Mitral annular calcification. No pericardial effusion. Moderate to severe atherosclerosis involving the thoracic and proximal abdominal aorta without evidence of aneurysm. Mediastinum/Nodes: Mass which is likely centered in the MMarydelwith extension into the mediastinum, estimated measurements of approximately 4 x 4 x 5.5 cm (2/34 and 6/74). RIGHT hilar lymphadenopathy. No mediastinal lymphadenopathy elsewhere. Normal appearing esophagus. Approximate 1.2 cm RIGHT lobe thyroid nodule, not present on the 2008 CT. Lungs/Pleura: Central RIGHT UPPER LOBE lung mass with mediastinal extension as described above. This obstructs segmental RIGHT UPPER LOBE bronchi, causing post-obstructive atelectasis and post-obstructive pneumonitis with necrosis. A second mass is suspected in the SUPERIOR segment LEFT LOWER LOBE measuring approximately 4 x 3 x 4 cm (5/57 and 6/90). There is associated pneumonitis with necrosis in the SUPERIOR segment. Emphysematous changes in the LEFT UPPER LOBE. No parenchymal nodules or masses in the LEFT lung. Minimal linear scar or atelectasis in the LEFT LOWER LOBE. No pleural effusions. Upper Abdomen: Diverticulosis involving the visualized proximal descending colon without evidence of acute diverticulitis. Benign cortical cysts involving the visualized UPPER poles of both kidneys. No acute abnormalities involving the visualized upper abdomen. Musculoskeletal: Degenerative changes throughout the thoracic spine. No acute findings. IMPRESSION: 1. Central RIGHT UPPER LOBE lung mass with mediastinal extension as described above and associated post-obstructive atelectasis and pneumonitis in the RIGHT UPPER LOBE, associated with necrosis. 2. Second mass is suspected in the superior segment LEFT LOWER LOBE with associated pneumonitis and necrosis. 3. RIGHT  hilar lymphadenopathy, likely metastatic. 4. No evidence of metastatic disease elsewhere. 5.  Approximate 1.2 cm RIGHT lobe thyroid nodule, not present on the 2008 CT. Thyroid ultrasound may be confirmatory. Non-emergent PET-CT is recommended in further evaluation to confirm the above findings. Aortic Atherosclerosis (ICD10-I70.0) and Emphysema (ICD10-J43.9). Electronically Signed   By: Evangeline Dakin M.D.   On: 06/08/2019 20:35   Mr Jeri Cos ZS Contrast  Result Date: 06/09/2019 CLINICAL DATA:  Lung cancer initial staging. EXAM: MRI HEAD WITHOUT AND WITH CONTRAST TECHNIQUE: Multiplanar, multiecho pulse sequences of the brain and surrounding structures were obtained without and with intravenous contrast. CONTRAST:  7.16m GADAVIST GADOBUTROL 1 MMOL/ML IV SOLN COMPARISON:  07/26/2017 brain MRI FINDINGS: The examination is severely degraded by motion. BRAIN: There is a punctate focus of abnormal diffusion restriction within the anterior right parietal lobe (series 3, image 34). Early confluent hyperintense T2-weighted signal of the periventricular and deep white matter, most commonly due to chronic ischemic microangiopathy. The cerebral and cerebellar volume are age-appropriate. There is no hydrocephalus. The midline structures are normal. There are 2 small foci of abnormal contrast enhancement. One is in the left frontal lobe (series 11, image 120) measuring approximately 3 mm. The other is in the paramedian right parietal lobe (series 11, image 108) also measuring 3-4 mm. Assessment for metastatic disease otherwise limited by the degree of motion. VASCULAR: The major intracranial arterial and venous sinus flow voids are normal. Susceptibility-sensitive sequences show no chronic microhemorrhage or superficial siderosis. SKULL AND UPPER CERVICAL SPINE: Calvarial bone marrow signal is normal. There is no skull base mass. The visualized upper cervical spine and soft tissues are normal. SINUSES/ORBITS: There are no fluid levels or advanced mucosal thickening. The mastoid air cells and middle ear cavities are  free of fluid. The orbits are normal. IMPRESSION: 1. Severely motion degraded study, which limits the detection of metastatic lesions. The below findings are nonspecific due to the degree of artifact and repeating the examination should be considered when the patient can be made more comfortable and able to tolerate the length of the study. 2. Punctate focus of abnormal diffusion restriction within the anterior right parietal lobe may indicate a small area of acute ischemia. Small metastases can also cause diffusion restriction, but there is no corresponding contrast enhancement visible. 3. 2 small foci of contrast enhancement within the left frontal lobe and right parietal lobe. These may be vascular, though small metastases could have this appearance. Electronically Signed   By: KUlyses JarredM.D.   On: 06/09/2019 22:37   Ct Abdomen Pelvis W Contrast  Result Date: 06/10/2019 CLINICAL DATA:  Lung mass seen on recent chest CT. Evaluate for metastatic disease. EXAM: CT ABDOMEN AND PELVIS WITH CONTRAST TECHNIQUE: Multidetector CT imaging of the abdomen and pelvis was performed using the standard protocol following bolus administration of intravenous contrast. CONTRAST:  741mOMNIPAQUE IOHEXOL 300 MG/ML  SOLN COMPARISON:  None. FINDINGS: Lower chest: The lung bases are clear of an acute process. Underlying emphysematous changes are noted. Left basilar scarring. The heart is normal in size. No pericardial effusion. Coronary artery and aortic calcifications are noted. Hepatobiliary: No focal hepatic lesions to suggest metastatic disease. The gallbladder is surgically absent and there is associated mild intra and extrahepatic biliary dilatation. Pancreas: No mass, mass, inflammation or ductal dilatation. Spleen: Normal size.  No focal lesions. Adrenals/Urinary Tract: The adrenal glands are normal. No worrisome lesions. The kidneys demonstrate small simple cysts along with left-sided parapelvic cysts. No worrisome  renal  lesions or hydronephrosis. The bladder is unremarkable. Stomach/Bowel: The stomach, duodenum, small bowel and colon are grossly normal without oral contrast. No acute inflammatory changes, mass lesions or obstructive findings. The terminal ileum is normal. Scattered colonic diverticulosis most notable in the sigmoid colon area but no findings for acute diverticulitis. Vascular/Lymphatic: Advanced atherosclerotic calcifications involving the aorta and iliac arteries but no aneurysm. The branch vessels are patent. The major venous structures are patent. No mesenteric or retroperitoneal mass or adenopathy. Reproductive: The uterus is surgically absent. Other: No pelvic mass or pelvic adenopathy. No free pelvic fluid collections. No inguinal mass or adenopathy. Musculoskeletal: No significant bony findings. Age related degenerative changes involving the spine and hips with chondrocalcinosis noted. No worrisome bone lesions. IMPRESSION: 1. No CT findings for metastatic disease involving the abdomen/pelvis or bony structures. 2. Status post cholecystectomy with associated mild biliary dilatation. 3. Advanced atherosclerotic calcifications involving the aorta and iliac arteries but no aneurysm or dissection. Electronically Signed   By: Marijo Sanes M.D.   On: 06/10/2019 05:03   US Thyroid  Result Date: 06/10/2019 CLINICAL DATA:  Thyroid nodule EXAM: THYROID ULTRASOUND TECHNIQUE: Ultrasound examination of the thyroid gland and adjacent soft tissues was performed. COMPARISON:  None. FINDINGS: Parenchymal Echotexture: Normal Isthmus: 0.3 cm Right lobe: 3.3 x 1.9 x 1.9 cm Left lobe: 3.6 x 1.3 x 1.2 cm _________________________________________________________ Estimated total number of nodules >/= 1 cm: 1 Number of spongiform nodules >/=  2 cm not described below (TR1): 0 Number of mixed cystic and solid nodules >/= 1.5 cm not described below (TR2): 0 _________________________________________________________ Nodule #  1: Location: Right; Mid Maximum size: 1.9 cm; Other 2 dimensions: 1.4 x 1.5 cm Composition: mixed cystic and solid (1) Echogenicity: isoechoic (1) Shape: not taller-than-wide (0) Margins: smooth (0) Echogenic foci: none (0) ACR TI-RADS total points: 2. ACR TI-RADS risk category: TR2 (2 points). ACR TI-RADS recommendations: This nodule does NOT meet TI-RADS criteria for biopsy or dedicated follow-up. _________________________________________________________ IMPRESSION: Normal-sized thyroid gland with a single distinct thyroid nodule in the right hemi thyroid that does not meet criteria for follow-up or FNA. The above is in keeping with the ACR TI-RADS recommendations - J Am Coll Radiol 2017;14:587-595. Electronically Signed   By: Constance Holster M.D.   On: 06/10/2019 01:47

## 2019-06-15 NOTE — Telephone Encounter (Signed)
LMTCB x3 for pt. We have attempted to contact pt several times with no success or call back from pt. Per triage protocol, message will be closed.   

## 2019-06-16 ENCOUNTER — Encounter: Payer: Self-pay | Admitting: Adult Health

## 2019-06-16 ENCOUNTER — Telehealth (INDEPENDENT_AMBULATORY_CARE_PROVIDER_SITE_OTHER): Payer: Medicare HMO | Admitting: Adult Health

## 2019-06-16 ENCOUNTER — Other Ambulatory Visit: Payer: Self-pay | Admitting: *Deleted

## 2019-06-16 VITALS — BP 138/64 | HR 99 | Ht 59.0 in | Wt 146.0 lb

## 2019-06-16 DIAGNOSIS — C3490 Malignant neoplasm of unspecified part of unspecified bronchus or lung: Secondary | ICD-10-CM | POA: Diagnosis not present

## 2019-06-16 DIAGNOSIS — I10 Essential (primary) hypertension: Secondary | ICD-10-CM

## 2019-06-16 DIAGNOSIS — G4733 Obstructive sleep apnea (adult) (pediatric): Secondary | ICD-10-CM | POA: Diagnosis not present

## 2019-06-16 DIAGNOSIS — C3400 Malignant neoplasm of unspecified main bronchus: Secondary | ICD-10-CM

## 2019-06-16 LAB — CULTURE, BLOOD (ROUTINE X 2)
Culture: NO GROWTH
Culture: NO GROWTH
Special Requests: ADEQUATE
Special Requests: ADEQUATE

## 2019-06-16 NOTE — Patient Instructions (Signed)
Medication Instructions:  Continue current medications  *If you need a refill on your cardiac medications before your next appointment, please call your pharmacy*  Lab Work: None Ordered  Testing/Procedures: None Ordered  Follow-Up: At [facility], you and your health needs are our priority.  As part of our continuing mission to provide you with exceptional heart care, we have created designated Provider Care Teams.  These Care Teams include your primary Cardiologist (physician) and Advanced Practice Providers (APPs -  Physician Assistants and Nurse Practitioners) who all work together to provide you with the care you need, when you need it.  Your next appointment:   3 months  The format for your next appointment:   In Person  Provider:   Shelva Majestic, MD

## 2019-06-17 ENCOUNTER — Inpatient Hospital Stay: Payer: Medicare HMO

## 2019-06-17 ENCOUNTER — Other Ambulatory Visit: Payer: Self-pay

## 2019-06-17 ENCOUNTER — Inpatient Hospital Stay: Payer: Medicare HMO | Attending: Hematology | Admitting: Hematology

## 2019-06-17 ENCOUNTER — Telehealth: Payer: Self-pay | Admitting: Hematology

## 2019-06-17 ENCOUNTER — Encounter: Payer: Self-pay | Admitting: Hematology

## 2019-06-17 VITALS — BP 166/77 | HR 110 | Temp 98.5°F | Resp 18 | Ht 59.0 in | Wt 145.2 lb

## 2019-06-17 DIAGNOSIS — D72829 Elevated white blood cell count, unspecified: Secondary | ICD-10-CM

## 2019-06-17 DIAGNOSIS — R05 Cough: Secondary | ICD-10-CM | POA: Diagnosis not present

## 2019-06-17 DIAGNOSIS — Z9049 Acquired absence of other specified parts of digestive tract: Secondary | ICD-10-CM | POA: Insufficient documentation

## 2019-06-17 DIAGNOSIS — K5730 Diverticulosis of large intestine without perforation or abscess without bleeding: Secondary | ICD-10-CM | POA: Diagnosis not present

## 2019-06-17 DIAGNOSIS — C3481 Malignant neoplasm of overlapping sites of right bronchus and lung: Secondary | ICD-10-CM | POA: Diagnosis present

## 2019-06-17 DIAGNOSIS — G4700 Insomnia, unspecified: Secondary | ICD-10-CM | POA: Diagnosis not present

## 2019-06-17 DIAGNOSIS — Z79899 Other long term (current) drug therapy: Secondary | ICD-10-CM | POA: Diagnosis not present

## 2019-06-17 DIAGNOSIS — C3490 Malignant neoplasm of unspecified part of unspecified bronchus or lung: Secondary | ICD-10-CM | POA: Diagnosis not present

## 2019-06-17 DIAGNOSIS — I700 Atherosclerosis of aorta: Secondary | ICD-10-CM | POA: Diagnosis not present

## 2019-06-17 DIAGNOSIS — R059 Cough, unspecified: Secondary | ICD-10-CM

## 2019-06-17 DIAGNOSIS — C3400 Malignant neoplasm of unspecified main bronchus: Secondary | ICD-10-CM

## 2019-06-17 DIAGNOSIS — Z7982 Long term (current) use of aspirin: Secondary | ICD-10-CM | POA: Insufficient documentation

## 2019-06-17 DIAGNOSIS — J439 Emphysema, unspecified: Secondary | ICD-10-CM | POA: Insufficient documentation

## 2019-06-17 LAB — CMP (CANCER CENTER ONLY)
ALT: 12 U/L (ref 0–44)
AST: 12 U/L — ABNORMAL LOW (ref 15–41)
Albumin: 3.2 g/dL — ABNORMAL LOW (ref 3.5–5.0)
Alkaline Phosphatase: 72 U/L (ref 38–126)
Anion gap: 12 (ref 5–15)
BUN: 10 mg/dL (ref 8–23)
CO2: 25 mmol/L (ref 22–32)
Calcium: 9 mg/dL (ref 8.9–10.3)
Chloride: 99 mmol/L (ref 98–111)
Creatinine: 0.94 mg/dL (ref 0.44–1.00)
GFR, Est AFR Am: >60 mL/min (ref >60)
GFR, Estimated: 53 mL/min — ABNORMAL LOW (ref >60)
Glucose, Bld: 101 mg/dL — ABNORMAL HIGH (ref 70–99)
Potassium: 3.9 mmol/L (ref 3.5–5.1)
Sodium: 136 mmol/L (ref 135–145)
Total Bilirubin: 0.4 mg/dL (ref 0.3–1.2)
Total Protein: 7.1 g/dL (ref 6.5–8.1)

## 2019-06-17 LAB — CBC WITH DIFFERENTIAL (CANCER CENTER ONLY)
Abs Immature Granulocytes: 0.16 10*3/uL — ABNORMAL HIGH (ref 0.00–0.07)
Basophils Absolute: 0 10*3/uL (ref 0.0–0.1)
Basophils Relative: 0 %
Eosinophils Absolute: 0.3 10*3/uL (ref 0.0–0.5)
Eosinophils Relative: 3 %
HCT: 38.4 % (ref 36.0–46.0)
Hemoglobin: 13 g/dL (ref 12.0–15.0)
Immature Granulocytes: 1 %
Lymphocytes Relative: 17 %
Lymphs Abs: 2 10*3/uL (ref 0.7–4.0)
MCH: 30 pg (ref 26.0–34.0)
MCHC: 33.9 g/dL (ref 30.0–36.0)
MCV: 88.5 fL (ref 80.0–100.0)
Monocytes Absolute: 0.8 10*3/uL (ref 0.1–1.0)
Monocytes Relative: 7 %
Neutro Abs: 8.3 10*3/uL — ABNORMAL HIGH (ref 1.7–7.7)
Neutrophils Relative %: 72 %
Platelet Count: 319 10*3/uL (ref 150–400)
RBC: 4.34 MIL/uL (ref 3.87–5.11)
RDW: 13.6 % (ref 11.5–15.5)
WBC Count: 11.6 10*3/uL — ABNORMAL HIGH (ref 4.0–10.5)
nRBC: 0 % (ref 0.0–0.2)

## 2019-06-17 MED ORDER — LORAZEPAM 0.5 MG PO TABS: 0.5000 mg | ORAL_TABLET | Freq: Once | ORAL | 0 refills | Status: AC

## 2019-06-17 NOTE — Telephone Encounter (Signed)
Scheduled per 11/04 los, patient received updated calender.

## 2019-06-19 ENCOUNTER — Other Ambulatory Visit: Payer: Self-pay | Admitting: *Deleted

## 2019-06-19 NOTE — Progress Notes (Signed)
The proposed treatment plan discussed at cancer conference on 06/18/2019 is for discussion purpose only and is not a binding recommendation.  The patient was not physically examined nor present for their treatment options.  Therefore, final treatment plans cannot be decided.

## 2019-06-22 ENCOUNTER — Ambulatory Visit: Payer: Medicare HMO | Admitting: Pulmonary Disease

## 2019-06-26 ENCOUNTER — Other Ambulatory Visit: Payer: Self-pay

## 2019-06-26 ENCOUNTER — Other Ambulatory Visit (HOSPITAL_COMMUNITY): Payer: Medicare HMO

## 2019-06-26 ENCOUNTER — Encounter (HOSPITAL_COMMUNITY)
Admission: RE | Admit: 2019-06-26 | Discharge: 2019-06-26 | Disposition: A | Discharge status: Home / Self Care | Payer: Medicare HMO | Source: Ambulatory Visit | Attending: Hematology | Admitting: Hematology

## 2019-06-26 ENCOUNTER — Ambulatory Visit (HOSPITAL_COMMUNITY)
Admission: RE | Admit: 2019-06-26 | Discharge: 2019-06-26 | Disposition: A | Discharge status: Home / Self Care | Payer: Medicare HMO | Source: Ambulatory Visit | Attending: Hematology | Admitting: Hematology

## 2019-06-26 DIAGNOSIS — C3490 Malignant neoplasm of unspecified part of unspecified bronchus or lung: Secondary | ICD-10-CM | POA: Diagnosis present

## 2019-06-26 DIAGNOSIS — I2510 Atherosclerotic heart disease of native coronary artery without angina pectoris: Secondary | ICD-10-CM | POA: Insufficient documentation

## 2019-06-26 DIAGNOSIS — I700 Atherosclerosis of aorta: Secondary | ICD-10-CM | POA: Diagnosis not present

## 2019-06-26 DIAGNOSIS — J439 Emphysema, unspecified: Secondary | ICD-10-CM | POA: Diagnosis not present

## 2019-06-26 LAB — GLUCOSE, CAPILLARY: Glucose-Capillary: 95 mg/dL (ref 70–99)

## 2019-06-26 MED ORDER — FLUDEOXYGLUCOSE F - 18 (FDG) INJECTION
7.6100 | Freq: Once | INTRAVENOUS | Status: AC
  Administered 2019-06-26: 7.61 via INTRAVENOUS

## 2019-06-26 MED ORDER — GADOBUTROL 1 MMOL/ML IV SOLN
6.5000 mL | Freq: Once | INTRAVENOUS | Status: AC | PRN
  Administered 2019-06-26: 6.5 mL via INTRAVENOUS

## 2019-06-29 NOTE — Progress Notes (Signed)
[place] OFFICE PROGRESS NOTE  Patient Care Team: Buzzy Han, MD as PCP - General (Family Medicine) Troy Sine, MD as PCP - Cardiology (Cardiology)  HEME/ONC OVERVIEW: 1. Stage IIIB [phone]) adenocarcinoma of the R lung, unresectable, MET exon 14 splice variant deletion+ -Late 05/2019:   A large R lung mass extending from the RUL to RLL with mediastinal invasion, R hilar denopathy on CT; no mets in abdomen  Questionable small foci in L frontal and R parietal lobes on MRI brain, limited due to motion degradation; repeat MRI still limited by   Bronch w/ R lung bx, c/w adenocarcinoma; MET exon 14 splice variant deletion+  Large FDG-avid R lung mass with extension to the R hilum and paratracheal LN involvement; no distant mets   TREATMENT SUMMARY:  Capmatinib, date TBD   ASSESSMENT & PLAN:   Stage IIIB ([id number]) adenocarcinoma of the R lung, unresectable, MET exon 14 splice variant deletion+ -I independently reviewed the radiologic images of recent PET and MRI brain, and agree with findings documented -In summary, PET showed a large FDG-avid right lung mass, consisting of consolidation and cavitation and involving the right upper and lower lobes (collectively measuring ~9cm), with extension into the right hilum and paratracheal adenopathy.  There was no definite metastatic disease.  Unfortunately, despite Ativan prior to the MRI brain, the patient was unable to complete the contrast portion of the MRI, and therefore limited repeat MRI brain did not show any definite metastatic disease. -I reviewed imaging results in detail with the patient -I also reviewed the NCCN guideline in detail with the patient  -Based on the most recent data from [place] mono-1 trial (published in [place] in September 2020), patients with Stage IIIB NSCLC w/ MET deletion were included in the trial, and in previously untreated patients, the response rate to capmatinib was 68% with  median duration of response ~12.6 months -I discussed with the patient some of the benefits and potential toxicities of the MET receptor inhibitor capmatinib -Goal of treatment is strictly palliative. -Some of the short term side-effects included, though not limited to, risk of fatigue, pancytopenia, life-threatening infections, allergic reactions, nausea, vomiting, sores in the mouth, changes in bowel habits especially diarrhea, admission to hospital for various reasons, and risks of death.  -The patient is aware that the response rates discussed earlier is not guaranteed.   -After a long discussion, patient made an informed decision to proceed.  -Once capmatinib is been approved, I will reach out to the patient to set up the appointment for toxicity monitoring  -I have also prescribed PRN Zofran for nausea   Chest discomfort -Likely related to underlying lung cancer with mediastinal invasion -No associated dyspnea, palpitation or diaphoresis -Continue PRN Tylenol; if pain worsens, we can consider adding opioid medication as needed   Insomnia -No improvement with OTC melatonin -I have prescribed trazodone PRN for insomnia   Orders Placed This Encounter  Procedures  . CBC w/ diff    Standing Status:   Standing    Number of Occurrences:   20    Standing Expiration Date:   06/30/2020  . CMP    Standing Status:   Standing    Number of Occurrences:   20    Standing Expiration Date:   06/30/2020   All questions were answered. The patient knows to call the clinic with any problems, questions or concerns. No barriers to learning was detected.  A total of more than 40 minutes were spent face-to-face  with the patient during this encounter and over half of that time was spent on counseling and coordination of care as outlined above.   Return in ~3 weeks for labs and clinic follow-up.   Tish Men, MD 07/01/2019 12:33 PM  CHIEF COMPLAINT: "I am doing okay"  INTERVAL HISTORY: Jo. Hendrix  returns to clinic for follow-up of adenocarcinoma of the right lung.  Patient reports that since last visit, she continues to have pressure/achy sensation in the chest and upper back, intermittent, mostly mild with occasional moderate intensity, for which she takes Tylenol as needed with resolution of the pain.  She denies any associated dyspnea, palpitation, diaphoresis.  She also reports mild exertional shortness of breath, which is unchanged.  She still has difficulty with sleep, and has tried melatonin in the past without any improvement.  She was prescribed Restoril after her recent hospitalization, but she has not taken any.  She denies any other complaint today.  REVIEW OF SYSTEMS:   Constitutional: ( - ) fevers, ( - )  chills , ( - ) night sweats Eyes: ( - ) blurriness of vision, ( - ) double vision, ( - ) watery eyes Ears, nose, mouth, throat, and face: ( - ) mucositis, ( - ) sore throat Respiratory: ( - ) cough, ( + ) exertional dyspnea, ( - ) wheezes Cardiovascular: ( - ) palpitation, ( + ) chest discomfort, ( - ) lower extremity swelling Gastrointestinal:  ( - ) nausea, ( - ) heartburn, ( - ) change in bowel habits Skin: ( - ) abnormal skin rashes Lymphatics: ( - ) new lymphadenopathy, ( - ) easy bruising Neurological: ( - ) numbness, ( - ) tingling, ( - ) new weaknesses Behavioral/Psych: ( - ) mood change, ( - ) new changes  All other systems were reviewed with the patient and are negative.  SUMMARY OF ONCOLOGIC HISTORY: Oncology History  Adenocarcinoma of right lung ([place])  06/08/2019 Imaging   CT chest: IMPRESSION: 1. Central RIGHT UPPER LOBE lung mass with mediastinal extension as described above and associated post-obstructive atelectasis and pneumonitis in the RIGHT UPPER LOBE, associated with necrosis. 2. Second mass is suspected in the superior segment LEFT LOWER LOBE with associated pneumonitis and necrosis. 3. RIGHT hilar lymphadenopathy, likely metastatic. 4. No  evidence of metastatic disease elsewhere. 5. Approximate 1.2 cm RIGHT lobe thyroid nodule, not present on the 2008 CT. Thyroid ultrasound may be confirmatory.   06/09/2019 Initial Diagnosis   Lung cancer ([place])   06/09/2019 Imaging   MRI brain: IMPRESSION: 1. Severely motion degraded study, which limits the detection of metastatic lesions. The below findings are nonspecific due to the degree of artifact and repeating the examination should be considered when the patient can be made more comfortable and able to tolerate the length of the study. 2. Punctate focus of abnormal diffusion restriction within the anterior right parietal lobe may indicate a small area of acute ischemia. Small metastases can also cause diffusion restriction, but there is no corresponding contrast enhancement visible. 3. 2 small foci of contrast enhancement within the left frontal lobe and right parietal lobe. These may be vascular, though small metastases could have this appearance.IMPRESSION: 1. Severely motion degraded study, which limits the detection of metastatic lesions. The below findings are nonspecific due to the degree of artifact and repeating the examination should be considered when the patient can be made more comfortable and able to tolerate the length of the study. 2. Punctate focus of abnormal diffusion restriction  within the anterior right parietal lobe may indicate a small area of acute ischemia. Small metastases can also cause diffusion restriction, but there is no corresponding contrast enhancement visible. 3. 2 small foci of contrast enhancement within the left frontal lobe and right parietal lobe. These may be vascular, though small metastases could have this appearance.   06/09/2019 Imaging   CT abdomen/pelvis: IMPRESSION: 1. No CT findings for metastatic disease involving the abdomen/pelvis or bony structures. 2. Status post cholecystectomy with associated mild  biliary dilatation. 3. Advanced atherosclerotic calcifications involving the aorta and iliac arteries but no aneurysm or dissection.   06/26/2019 Imaging   PET:  IMPRESSION: 1. Hypermetabolic right upper/right lower lobe thick-walled cavitary mass with a hypermetabolic ipsilateral low right paratracheal lymph node, findings most consistent with primary bronchogenic carcinoma (T4 N2 M0 or stage IIIB disease). 2. Aortic atherosclerosis (ICD10-170.0). Coronary artery calcification. 3.  Emphysema (ICD10-J43.9).   06/26/2019 Imaging   MRI brain w/o contrast: IMPRESSION: The patient was able to hold still for the unenhanced images however was moving during the postcontrast on the images, limiting evaluation for metastatic disease   No definite metastatic deposits identified   Atrophy and chronic microvascular ischemic change in the white matter.     I have reviewed the past medical history, past surgical history, social history and family history with the patient and they are unchanged from previous note.  ALLERGIES:  is allergic to acetaminophen; benadryl [diphenhydramine]; zetia [ezetimibe]; gabapentin; hydrochlorothiazide; ace inhibitors; codeine; erythromycin; etodolac; and ivp dye [iodinated diagnostic agents].  MEDICATIONS:  Current Outpatient Medications  Medication Sig Dispense Refill  . amLODipine (NORVASC) 10 MG tablet Take 1 tablet (10 mg total) by mouth daily. 90 tablet 1  . aspirin 81 MG tablet Take 81 mg by mouth daily.    . Calcium 1500 MG tablet Take 1,500 mg by mouth daily.    . capmatinib (TABRECTA) 200 MG tablet Take 2 tablets (400 mg total) by mouth 2 (two) times daily. 120 tablet 11  . chlorpheniramine-HYDROcodone (TUSSIONEX) 10-8 MG/5ML SUER Take 5 mLs by mouth every 12 (twelve) hours as needed for cough (1st dose now). 140 mL 0  . cholecalciferol (VITAMIN D) 1000 UNITS tablet Take 1,000 Units by mouth daily. Take 1 tab daily    . NON FORMULARY at bedtime.  BiPAP    . Omega-3 Fatty Acids (FISH OIL) 1000 MG CAPS Take 1 capsule by mouth daily.     . ondansetron (ZOFRAN) 8 MG tablet Take 1 tablet (8 mg total) by mouth every 8 (eight) hours as needed for nausea or vomiting. 40 tablet 3  . RESTASIS 0.05 % ophthalmic emulsion Place 1 drop into both eyes 2 (two) times daily. Use 1 drop in each eye twice a day    . rosuvastatin (CRESTOR) 5 MG tablet TAKE 1 TABLET EVERY OTHER DAY (Patient taking differently: Take 5 mg by mouth See admin instructions. ) 45 tablet 1  . traZODone (DESYREL) 50 MG tablet Take 1 tablet (50 mg total) by mouth at bedtime as needed for sleep. 30 tablet 5   No current facility-administered medications for this visit.     PHYSICAL EXAMINATION: ECOG PERFORMANCE STATUS: 2 - Symptomatic, <50% confined to bed  Today's Vitals   07/01/19 1129 07/01/19 1134  BP: (!) 155/76   Pulse: 100   Resp: 18   Temp: 98.2 F (36.8 C)   TempSrc: Temporal   SpO2: 95%   Weight: 142 lb (64.4 kg)   Height: '4\' 11"'  (1.499  m)   PainSc:  0-No pain   Body mass index is 28.68 kg/m.  Filed Weights   07/01/19 1129  Weight: 142 lb (64.4 kg)    GENERAL: alert, no distress and comfortable SKIN: skin color, texture, turgor are normal, no rashes or significant lesions EYES: conjunctiva are pink and non-injected, sclera clear OROPHARYNX: no exudate, no erythema; lips, buccal mucosa, and tongue normal  NECK: supple, non-tender LUNGS: clear to auscultation with normal breathing effort HEART: regular rate & rhythm and no murmurs and no lower extremity edema ABDOMEN: soft, non-tender, non-distended, normal bowel sounds Musculoskeletal: no cyanosis of digits and no clubbing  PSYCH: alert & oriented x 3, fluent speech  LABORATORY DATA:  I have reviewed the data as listed    Component Value Date/Time   NA 136 06/17/2019 1237   NA 140 09/22/2018 1102   K 3.9 06/17/2019 1237   CL 99 06/17/2019 1237   CO2 25 06/17/2019 1237   GLUCOSE 101 (H)  06/17/2019 1237   BUN 10 06/17/2019 1237   BUN 15 09/22/2018 1102   CREATININE 0.94 06/17/2019 1237   CREATININE 1.35 (H) 01/16/2017 0817   CALCIUM 9.0 06/17/2019 1237   PROT 7.1 06/17/2019 1237   PROT 6.8 09/22/2018 1102   ALBUMIN 3.2 (L) 06/17/2019 1237   ALBUMIN 3.9 09/22/2018 1102   AST 12 (L) 06/17/2019 1237   ALT 12 06/17/2019 1237   ALKPHOS 72 06/17/2019 1237   BILITOT 0.4 06/17/2019 1237   GFRNONAA 53 (L) 06/17/2019 1237   GFRAA >60 06/17/2019 1237    No results found for: SPEP, UPEP  Lab Results  Component Value Date   WBC 11.6 (H) 06/17/2019   NEUTROABS 8.3 (H) 06/17/2019   HGB 13.0 06/17/2019   HCT 38.4 06/17/2019   MCV 88.5 06/17/2019   PLT 319 06/17/2019      Chemistry      Component Value Date/Time   NA 136 06/17/2019 1237   NA 140 09/22/2018 1102   K 3.9 06/17/2019 1237   CL 99 06/17/2019 1237   CO2 25 06/17/2019 1237   BUN 10 06/17/2019 1237   BUN 15 09/22/2018 1102   CREATININE 0.94 06/17/2019 1237   CREATININE 1.35 (H) 01/16/2017 0817      Component Value Date/Time   CALCIUM 9.0 06/17/2019 1237   ALKPHOS 72 06/17/2019 1237   AST 12 (L) 06/17/2019 1237   ALT 12 06/17/2019 1237   BILITOT 0.4 06/17/2019 1237       RADIOGRAPHIC STUDIES: I have personally reviewed the radiological images as listed below and agreed with the findings in the report. Dg Chest 2 View  Result Date: 06/08/2019 CLINICAL DATA:  Chest pain with acute onset shortness of breath and tachycardia today. EXAM: CHEST - 2 VIEW COMPARISON:  03/30/2015 FINDINGS: Lungs are adequately inflated and demonstrate airspace consolidation over the right upper lobe with mild patchy density over the right midlung and left mid to lower lung suggesting multifocal infection. Possible subtle air-fluid level within the consolidation in the posterior right upper lobe. Cardiomediastinal silhouette and remainder of the exam is unchanged. No evidence of effusion. IMPRESSION: Multifocal airspace process  worse over the right upper lobe with possible air-fluid level over the right upper lobe which may be due to cavitary process or abscess. Consider chest CT for further evaluation. Electronically Signed   By: Marin Olp M.D.   On: 06/08/2019 14:53   Ct Chest W Contrast  Addendum Date: 06/23/2019   ADDENDUM REPORT: 06/23/2019 07:56  ADDENDUM: In the Findings section of the report, the second sentence in the Lungs/Pleura section should read: A second mass is present in the SUPERIOR segment RIGHT LOWER LOBE. Electronically Signed   By: Evangeline Dakin M.D.   On: 06/23/2019 07:56   Result Date: 06/23/2019 CLINICAL DATA:  85 year old with a possible cavitary lesion involving the RIGHT UPPER LOBE on chest x-ray earlier today. She presented with acute chest pain, palpitations and shortness of breath. She has also had a nonproductive cough for the past several weeks EXAM: CT CHEST WITH CONTRAST TECHNIQUE: Multidetector CT imaging of the chest was performed during intravenous contrast administration. CONTRAST:  28m OMNIPAQUE IOHEXOL 300 MG/ML IV. COMPARISON:  Chest x-ray earlier same day. CTA chest 05/02/2007. FINDINGS: Cardiovascular: Normal heart size. Moderate three-vessel coronary atherosclerosis. Mitral annular calcification. No pericardial effusion. Moderate to severe atherosclerosis involving the thoracic and proximal abdominal aorta without evidence of aneurysm. Mediastinum/Nodes: Mass which is likely centered in the MPoplarwith extension into the mediastinum, estimated measurements of approximately 4 x 4 x 5.5 cm (2/34 and 6/74). RIGHT hilar lymphadenopathy. No mediastinal lymphadenopathy elsewhere. Normal appearing esophagus. Approximate 1.2 cm RIGHT lobe thyroid nodule, not present on the 2008 CT. Lungs/Pleura: Central RIGHT UPPER LOBE lung mass with mediastinal extension as described above. This obstructs segmental RIGHT UPPER LOBE bronchi, causing post-obstructive atelectasis and  post-obstructive pneumonitis with necrosis. A second mass is suspected in the SUPERIOR segment LEFT LOWER LOBE measuring approximately 4 x 3 x 4 cm (5/57 and 6/90). There is associated pneumonitis with necrosis in the SUPERIOR segment. Emphysematous changes in the LEFT UPPER LOBE. No parenchymal nodules or masses in the LEFT lung. Minimal linear scar or atelectasis in the LEFT LOWER LOBE. No pleural effusions. Upper Abdomen: Diverticulosis involving the visualized proximal descending colon without evidence of acute diverticulitis. Benign cortical cysts involving the visualized UPPER poles of both kidneys. No acute abnormalities involving the visualized upper abdomen. Musculoskeletal: Degenerative changes throughout the thoracic spine. No acute findings. IMPRESSION: 1. Central RIGHT UPPER LOBE lung mass with mediastinal extension as described above and associated post-obstructive atelectasis and pneumonitis in the RIGHT UPPER LOBE, associated with necrosis. 2. Second mass is suspected in the superior segment LEFT LOWER LOBE with associated pneumonitis and necrosis. 3. RIGHT hilar lymphadenopathy, likely metastatic. 4. No evidence of metastatic disease elsewhere. 5. Approximate 1.2 cm RIGHT lobe thyroid nodule, not present on the 2008 CT. Thyroid ultrasound may be confirmatory. Non-emergent PET-CT is recommended in further evaluation to confirm the above findings. Aortic Atherosclerosis (ICD10-I70.0) and Emphysema (ICD10-J43.9). Electronically Signed: By: TEvangeline DakinM.D. On: 06/08/2019 20:35   Jo BJeri CosWRJContrast  Result Date: 06/26/2019 CLINICAL DATA:  Non-small-cell lung cancer staging. Possible metastatic deposits on prior MRI degraded by motion EXAM: MRI HEAD WITHOUT AND WITH CONTRAST TECHNIQUE: Multiplanar, multiecho pulse sequences of the brain and surrounding structures were obtained without and with intravenous contrast. CONTRAST:  6.514mGADAVIST GADOBUTROL 1 MMOL/ML IV SOLN COMPARISON:  MRI head  06/09/2019 FINDINGS: Brain: Unenhanced images are of good quality without significant motion however the patient had difficulty holding still for the postcontrast images. Area of restricted diffusion in the right parietal white matter no longer visualized Previously study noted areas of enhancement in the left frontal lobe and right parietal lobe. These appear to be related to vascular enhancement. No mass lesion identified. No definite metastatic deposits.  Evaluation limited by motion. Ventricle size normal. Negative for acute infarct. Chronic microvascular ischemic changes in the white  matter. Generalized atrophy. Negative for hemorrhage. Vascular: Normal arterial flow voids Skull and upper cervical spine: No focal skeletal lesion. Sinuses/Orbits: Paranasal sinuses clear.  Bilateral cataract surgery Other: None IMPRESSION: The patient was able to hold still for the unenhanced images however was moving during the postcontrast on the images, limiting evaluation for metastatic disease No definite metastatic deposits identified Atrophy and chronic microvascular ischemic change in the white matter. Electronically Signed   By: Franchot Gallo M.D.   On: 06/26/2019 13:38   Jo Jeri Hendrix KP Contrast  Result Date: 06/09/2019 CLINICAL DATA:  Lung cancer initial staging. EXAM: MRI HEAD WITHOUT AND WITH CONTRAST TECHNIQUE: Multiplanar, multiecho pulse sequences of the brain and surrounding structures were obtained without and with intravenous contrast. CONTRAST:  7.40m GADAVIST GADOBUTROL 1 MMOL/ML IV SOLN COMPARISON:  07/26/2017 brain MRI FINDINGS: The examination is severely degraded by motion. BRAIN: There is a punctate focus of abnormal diffusion restriction within the anterior right parietal lobe (series 3, image 34). Early confluent hyperintense T2-weighted signal of the periventricular and deep white matter, most commonly due to chronic ischemic microangiopathy. The cerebral and cerebellar volume are age-appropriate.  There is no hydrocephalus. The midline structures are normal. There are 2 small foci of abnormal contrast enhancement. One is in the left frontal lobe (series 11, image 120) measuring approximately 3 mm. The other is in the paramedian right parietal lobe (series 11, image 108) also measuring 3-4 mm. Assessment for metastatic disease otherwise limited by the degree of motion. VASCULAR: The major intracranial arterial and venous sinus flow voids are normal. Susceptibility-sensitive sequences show no chronic microhemorrhage or superficial siderosis. SKULL AND UPPER CERVICAL SPINE: Calvarial bone marrow signal is normal. There is no skull base mass. The visualized upper cervical spine and soft tissues are normal. SINUSES/ORBITS: There are no fluid levels or advanced mucosal thickening. The mastoid air cells and middle ear cavities are free of fluid. The orbits are normal. IMPRESSION: 1. Severely motion degraded study, which limits the detection of metastatic lesions. The below findings are nonspecific due to the degree of artifact and repeating the examination should be considered when the patient can be made more comfortable and able to tolerate the length of the study. 2. Punctate focus of abnormal diffusion restriction within the anterior right parietal lobe may indicate a small area of acute ischemia. Small metastases can also cause diffusion restriction, but there is no corresponding contrast enhancement visible. 3. 2 small foci of contrast enhancement within the left frontal lobe and right parietal lobe. These may be vascular, though small metastases could have this appearance. Electronically Signed   By: KUlyses JarredM.D.   On: 06/09/2019 22:37   Ct Abdomen Pelvis W Contrast  Result Date: 06/10/2019 CLINICAL DATA:  Lung mass seen on recent chest CT. Evaluate for metastatic disease. EXAM: CT ABDOMEN AND PELVIS WITH CONTRAST TECHNIQUE: Multidetector CT imaging of the abdomen and pelvis was performed using the  standard protocol following bolus administration of intravenous contrast. CONTRAST:  740mOMNIPAQUE IOHEXOL 300 MG/ML  SOLN COMPARISON:  None. FINDINGS: Lower chest: The lung bases are clear of an acute process. Underlying emphysematous changes are noted. Left basilar scarring. The heart is normal in size. No pericardial effusion. Coronary artery and aortic calcifications are noted. Hepatobiliary: No focal hepatic lesions to suggest metastatic disease. The gallbladder is surgically absent and there is associated mild intra and extrahepatic biliary dilatation. Pancreas: No mass, mass, inflammation or ductal dilatation. Spleen: Normal size.  No focal lesions. Adrenals/Urinary Tract:  The adrenal glands are normal. No worrisome lesions. The kidneys demonstrate small simple cysts along with left-sided parapelvic cysts. No worrisome renal lesions or hydronephrosis. The bladder is unremarkable. Stomach/Bowel: The stomach, duodenum, small bowel and colon are grossly normal without oral contrast. No acute inflammatory changes, mass lesions or obstructive findings. The terminal ileum is normal. Scattered colonic diverticulosis most notable in the sigmoid colon area but no findings for acute diverticulitis. Vascular/Lymphatic: Advanced atherosclerotic calcifications involving the aorta and iliac arteries but no aneurysm. The branch vessels are patent. The major venous structures are patent. No mesenteric or retroperitoneal mass or adenopathy. Reproductive: The uterus is surgically absent. Other: No pelvic mass or pelvic adenopathy. No free pelvic fluid collections. No inguinal mass or adenopathy. Musculoskeletal: No significant bony findings. Age related degenerative changes involving the spine and hips with chondrocalcinosis noted. No worrisome bone lesions. IMPRESSION: 1. No CT findings for metastatic disease involving the abdomen/pelvis or bony structures. 2. Status post cholecystectomy with associated mild biliary  dilatation. 3. Advanced atherosclerotic calcifications involving the aorta and iliac arteries but no aneurysm or dissection. Electronically Signed   By: Marijo Sanes M.D.   On: 06/10/2019 05:03   Nm Pet Image Initial (pi) Skull Base To Thigh  Result Date: 06/26/2019 CLINICAL DATA:  Initial treatment strategy for lung cancer. EXAM: NUCLEAR MEDICINE PET SKULL BASE TO THIGH TECHNIQUE: 7.6 mCi F-18 FDG was injected intravenously. Full-ring PET imaging was performed from the skull base to thigh after the radiotracer. CT data was obtained and used for attenuation correction and anatomic localization. Fasting blood glucose: 95 mg/dl COMPARISON:  CT abdomen pelvis 06/09/2019 and CT chest 06/08/2019. FINDINGS: Mediastinal blood pool activity: SUV max 2.9 Liver activity: SUV max NA NECK: No abnormal hypermetabolism. Incidental CT findings: None. CHEST: Masslike consolidation and cavitation in the right upper and right lower lobes collectively measures at least 9.0 x 9.4 cm (4/50) with an SUV max 17.7. There is extension to, and involvement of, the right hilum. Hypermetabolic low right paratracheal lymph node measures 11 mm with an SUV max of 10.3. No hypermetabolic axillary or left hilar adenopathy. Incidental CT findings: Atherosclerotic calcification of the aorta and coronary arteries. Heart size within normal limits. No pericardial or pleural effusion. Centrilobular emphysema. ABDOMEN/PELVIS: No abnormal hypermetabolism in the liver, adrenal glands, spleen or pancreas. No hypermetabolic lymph nodes. Incidental CT findings: Liver is unremarkable. Cholecystectomy. Adrenal glands are unremarkable. Residual contrast in the renal collecting systems and bladder. Low-attenuation lesions in the kidneys measure up to 1.7 cm off lower pole left kidney and are likely cysts. 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 Spleen, pancreas, stomach and bowel are unremarkable.  Atherosclerotic calcification of the aorta without aneurysm. SKELETON: No abnormal osseous hypermetabolism. Incidental CT findings: Degenerative changes in the spine. IMPRESSION: 1. Hypermetabolic right upper/right lower lobe thick-walled cavitary mass with a hypermetabolic ipsilateral low right paratracheal lymph node, findings most consistent with primary bronchogenic carcinoma (T4 N2 M0 or stage IIIB disease). 2. Aortic atherosclerosis (ICD10-170.0). Coronary artery calcification. 3.  Emphysema (ICD10-J43.9). Electronically Signed   By: Lorin Picket M.D.   On: 06/26/2019 14:46   US Thyroid  Result Date: 06/10/2019 CLINICAL DATA:  Thyroid nodule EXAM: THYROID ULTRASOUND TECHNIQUE: Ultrasound examination of the thyroid gland and adjacent soft tissues was performed. COMPARISON:  None. FINDINGS: Parenchymal Echotexture: Normal Isthmus: 0.3 cm Right lobe: 3.3 x 1.9 x 1.9 cm Left lobe: 3.6 x 1.3 x 1.2 cm _________________________________________________________ Estimated total number  of nodules >/= 1 cm: 1 Number of spongiform nodules >/=  2 cm not described below (TR1): 0 Number of mixed cystic and solid nodules >/= 1.5 cm not described below ([place]): 0 _________________________________________________________ Nodule # 1: Location: Right; Mid Maximum size: 1.9 cm; Other 2 dimensions: 1.4 x 1.5 cm Composition: mixed cystic and solid (1) Echogenicity: isoechoic (1) Shape: not taller-than-wide (0) Margins: smooth (0) Echogenic foci: none (0) ACR TI-RADS total points: 2. ACR TI-RADS risk category: TR2 (2 points). ACR TI-RADS recommendations: This nodule does NOT meet TI-RADS criteria for biopsy or dedicated follow-up. _________________________________________________________ IMPRESSION: Normal-sized thyroid gland with a single distinct thyroid nodule in the right hemi thyroid that does not meet criteria for follow-up or FNA. The above is in keeping with the ACR TI-RADS recommendations - J Am Coll Radiol  2017;14:587-595. Electronically Signed   By: Constance Holster M.D.   On: 06/10/2019 01:47

## 2019-07-01 ENCOUNTER — Encounter: Payer: Self-pay | Admitting: Hematology

## 2019-07-01 ENCOUNTER — Telehealth: Payer: Self-pay | Admitting: Pharmacist

## 2019-07-01 ENCOUNTER — Other Ambulatory Visit: Payer: Self-pay | Admitting: Hematology

## 2019-07-01 ENCOUNTER — Other Ambulatory Visit: Payer: Self-pay

## 2019-07-01 ENCOUNTER — Inpatient Hospital Stay (HOSPITAL_BASED_OUTPATIENT_CLINIC_OR_DEPARTMENT_OTHER): Payer: Medicare HMO | Admitting: Hematology

## 2019-07-01 ENCOUNTER — Telehealth: Payer: Self-pay | Admitting: Hematology

## 2019-07-01 VITALS — BP 155/76 | HR 100 | Temp 98.2°F | Resp 18 | Ht 59.0 in | Wt 142.0 lb

## 2019-07-01 DIAGNOSIS — C3491 Malignant neoplasm of unspecified part of right bronchus or lung: Secondary | ICD-10-CM

## 2019-07-01 DIAGNOSIS — C3481 Malignant neoplasm of overlapping sites of right bronchus and lung: Secondary | ICD-10-CM | POA: Diagnosis not present

## 2019-07-01 DIAGNOSIS — R110 Nausea: Secondary | ICD-10-CM

## 2019-07-01 DIAGNOSIS — R0789 Other chest pain: Secondary | ICD-10-CM | POA: Diagnosis not present

## 2019-07-01 DIAGNOSIS — G4700 Insomnia, unspecified: Secondary | ICD-10-CM | POA: Diagnosis not present

## 2019-07-01 MED ORDER — CAPMATINIB HCL 200 MG PO TABS: 400.0000 mg | ORAL_TABLET | Freq: Two times a day (BID) | ORAL | 11 refills | Status: DC

## 2019-07-01 MED ORDER — ONDANSETRON HCL 8 MG PO TABS: 8.0000 mg | ORAL_TABLET | Freq: Three times a day (TID) | ORAL | 3 refills | Status: DC | PRN

## 2019-07-01 MED ORDER — TRAZODONE HCL 50 MG PO TABS: 50.0000 mg | ORAL_TABLET | Freq: Every evening | ORAL | 5 refills | Status: DC | PRN

## 2019-07-01 NOTE — Progress Notes (Unsigned)
cap 

## 2019-07-01 NOTE — Telephone Encounter (Signed)
Oral Oncology Pharmacist Encounter  Insurance authorization for Tabrecta (capmatinib) tablets submitted to [employer] Part D insurance on Cover My Meds  Key: [place] Molecular report attached to insurance authorization request Status: pending  This encounter will continue to be updated until final determination.  Johny Drilling, PharmD, BCPS, BCOP  07/01/2019   4:15 PM Oral Oncology Clinic [phone]

## 2019-07-01 NOTE — Telephone Encounter (Signed)
I left a message regarding schedule  

## 2019-07-01 NOTE — Telephone Encounter (Signed)
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  Capmatinib is planned to be initiated at 400 mg by mouth 2 times daily.  Labs from 06/17/19 assessed, OK for treatment initiation.  Current medication list in Epic reviewed, moderate DDI with capmatinib and rosuvastatin identified:  Category C interaction: capmatinib is an inhibitor of BRCP and ay increase rosuvastatin exposure by 2-fold. Rosuvastatin dose very low, no change to current therapy indicated at this time, patient will be counseled to monitor for increased symptoms associated with rosuvastatin use.  Prescription has been e-scribed to the [facility] for benefits analysis and approval by MD. I have resent prescription for quantity #112 as it is supplied in #56 count bottles.  I am awaiting a copy of the molecular pathology report. Once received, insurance authorization will be submitted.  Oral Oncology Clinic will continue to follow for insurance authorization, copayment issues, initial counseling and start date.  Johny Drilling, PharmD, BCPS, BCOP  07/01/2019 1:43 PM Oral Oncology Clinic [phone]

## 2019-07-02 NOTE — Telephone Encounter (Signed)
Oral Chemotherapy Pharmacist Encounter   Attempted to reach patient on home phone number and son's cell phone number to provide update and offer for initial counseling on oral medication: Tabrecta.  No answer.  Left voicemail forthem to call back to discuss details of medication acquisition and initial counseling session.  Insurance authorization for Suzette Battiest has been approved. Test claim at the pharmacy revealed copayment $3022.34. There are no foundation copayment grants currently available for patient's diagnosis. I will discuss applying for manufacturer compassionate use program when I am able to speak with patient or family.  Johny Drilling, PharmD, BCPS, BCOP  07/02/2019   10:00 AM Oral Oncology Clinic [phone]

## 2019-07-02 NOTE — Telephone Encounter (Signed)
Oral Oncology Pharmacist Encounter  Insurance authorization for Tabrecta (capmatinib) tablets submitted to [employer] Part D insurance on Cover My Meds  I received an unfavorable determination fax from [facility] that stated the medication was denied, but did meet criteria for medical necessity and was approved for 6 months in 30-day supplies at a time..  I called [employer] clinical review department at [phone] for further explanation of determination.  Tabrecta (capmatinib) tablets are approved Effective dates: 07/01/2019-12/29/2019 Auth# [id number]  PA representative did run a test claim for quantity #112 tablets per a 28 day supply, and the claim went through without issue.  Johny Drilling, PharmD, BCPS, BCOP  07/02/2019   8:15 AM Oral Oncology Clinic [phone]

## 2019-07-03 ENCOUNTER — Ambulatory Visit: Payer: Medicare HMO | Admitting: Cardiovascular Disease

## 2019-07-06 NOTE — Telephone Encounter (Signed)
Oral Chemotherapy Pharmacist Encounter   Patient's son returned my call and had to leave a voicemail. I attempted to reach him on his cell phone. Left voicemail for call back to discuss details of medication acquisition and initial counseling session.  Insurance authorization for Suzette Battiest has been approved. Test claim at the pharmacy revealed copayment $3022.34. There are no foundation copayment grants currently available for patient's diagnosis. I will discuss applying for manufacturer compassionate use program when I am able to speak with patient or family  Johny Drilling, PharmD, BCPS, BCOP  07/06/2019   10:28 AM Oral Oncology Clinic [phone]

## 2019-07-07 ENCOUNTER — Telehealth: Payer: Self-pay | Admitting: Pharmacist

## 2019-07-07 MED ORDER — PROCHLORPERAZINE MALEATE 10 MG PO TABS: 10.0000 mg | ORAL_TABLET | Freq: Four times a day (QID) | ORAL | 0 refills | Status: DC | PRN

## 2019-07-07 NOTE — Telephone Encounter (Signed)
Thank you for your hard work. Pls keep me posted regarding the start date when the patient receives the medication.  Dr. Maylon Peppers

## 2019-07-07 NOTE — Telephone Encounter (Signed)
Oral Oncology Pharmacist Encounter  I submitted an application for [employer] patient assistance application online in an effort to obtain Tabrecta at $0 out of pocket cost.  Confirmation # [id number]  [employer] phone number for follow-up: [phone]  This encounter will continue to be updated until final determination.  Johny Drilling, PharmD, BCPS, BCOP  07/07/2019 12:18 PM Oral Oncology Clinic [phone]

## 2019-07-07 NOTE — Telephone Encounter (Signed)
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   Counseled patient on administration, dosing, side effects, monitoring, drug-food interactions, safe handling, storage, and disposal.  Patient will take Tabrecta 200 mg tablets, 2 tablets (400 mg) by mouth 2 times daily, taken with or without food.  Patient informed to avoid grapefruit and grapefruit juice.  Tabrecta start date: TBD, pending medication acquisition  Side effects include but not limited to: peripheral edema, fatigue, non-cardiac chest pain, dyspnea, cough, GI toxicity (nausea, vomiting, decreased appetite, constipation, diarrhea), electrolyte abnormalities, decreased white blood cells, decreased hemoglobin, changes in liver function tests, increased amylase or lipase, fever, and increased sun sensitivity.   Discussed with patient rare bust serious adverse event of severe, life-threatening, and/or fatal interstitial lung disease (ILD) and/or pneumonitis. Patient instructed to contact office with any changes in breathing.   Patient has a prescription for ondansetron. I also sent a prescription for prochlorperazine to local [place] as patient stated the ondansetron works only some of the time to control nausea. We discussed constipation management using Miralax.  Reviewed with patient importance of keeping a medication schedule and plan for any missed doses.  Medication reconciliation performed and medication/allergy list updated.  Insurance authorization for Suzette Battiest has been obtained. Test claim at the pharmacy revealed copayment $3022.34 for 1st fill of Tabrecta. This is not affordable to patient. There are no foundation copayment grants currently available for  patient's diagnosis. I will assist patient in completing an application for manufacturer assistance through the [employer] patient assistance program. Application will be documented in a separate encounter.  All questions answered.  Mrs. Durfee voiced understanding and appreciation.   Patient knows to call the office with questions or concerns.  Johny Drilling, PharmD, BCPS, BCOP  07/07/2019  11:41 AM Oral Oncology Clinic [phone]

## 2019-07-07 NOTE — Telephone Encounter (Signed)
Oral Chemotherapy Pharmacist Encounter   Attempted to reach patient and son to provide update and offer for initial counseling on oral medication: Tabrecta.  No answer.  Left voicemail for call back to discuss details of medication acquisition and initial counseling session.  Insurance authorization for Suzette Battiest has been approved. Test claim at the pharmacy revealed copayment $3022.34. There are no foundation copayment grants currently available for patient's diagnosis. I will discuss applying for manufacturer compassionate use program when I am able to speak with patient or family  Johny Drilling, PharmD, BCPS, BCOP  07/07/2019   11:30 AM Oral Oncology Clinic [phone]

## 2019-07-08 ENCOUNTER — Ambulatory Visit: Payer: Medicare HMO | Admitting: Cardiovascular Disease

## 2019-07-08 NOTE — Telephone Encounter (Signed)
Oral Oncology Pharmacist Encounter  Received notification from the [employer] patient assistance program that patient will receive a free 14-day trial supply of Tabrecta while the full application remains in process. The notice states that they pharmacy will reach out to patient to coordinate shipment of this trial supply.  Case #: [id number]  [employer] phone number for follow-up: [phone]  This encounter will continue to be updated until final determination for enrollment application.  Johny Drilling, PharmD, BCPS, BCOP  07/08/2019 8:57 AM  Oral Oncology Clinic [phone]

## 2019-07-08 NOTE — Telephone Encounter (Signed)
Thank you for keeping me posted.  Dr. Maylon Peppers

## 2019-07-08 NOTE — Telephone Encounter (Signed)
Oral Oncology Pharmacist Encounter  Discussed free trial with MD.  He does not want patient starting on this free trial until we have determination for continued medication acquisition.  I LVM for patient and son Merry Proud to be aware of 14-day free trial. On message I instructed patient to not start her medication until we find out about final determination about full enrollment into the program. Phone number to oral oncology clinic left on voicemail as well. I will alert patient and son to final determination from [employer] as soon as it is received.  This encounter will continue to be updated until final determination for enrollment application.  Johny Drilling, PharmD, BCPS, BCOP  07/08/2019 2:07 PM Oral Oncology Clinic [phone]

## 2019-07-14 ENCOUNTER — Telehealth: Payer: Self-pay

## 2019-07-14 ENCOUNTER — Other Ambulatory Visit: Payer: Self-pay | Admitting: Hematology

## 2019-07-14 DIAGNOSIS — C3491 Malignant neoplasm of unspecified part of right bronchus or lung: Secondary | ICD-10-CM

## 2019-07-14 MED ORDER — CAPMATINIB HCL 200 MG PO TABS: 400.0000 mg | ORAL_TABLET | Freq: Two times a day (BID) | ORAL | 11 refills | Status: DC

## 2019-07-14 NOTE — Telephone Encounter (Signed)
Oral Oncology Pharmacist Encounter  I called [employer] patient assistance program at [phone] to follow up on status of application for Tabrecta.  Application is through benefits investigation phase. Program informed patient is not able to afford her out of pocket for the [place].  They will now refer patient for enrollment into their free drug program. It may take 2-3 business days prior to final determination.  They also require a printed, signed, and faxed prescription for the Tabrecta to be faxed to their program at [phone]. Fax cover sheet should contain Patient's name and DOB. Referral for free drug will not take place until prescription is received at the program.  MD and collaborative practice RN, please complete this prescription step.  This encounter will continue to be updated until final determination for enrollment application.  Johny Drilling, PharmD, BCPS, BCOP  07/14/2019 11:52 AM Oral Oncology Clinic [phone]

## 2019-07-14 NOTE — Telephone Encounter (Signed)
Thank you for the update. I have signed the printed prescription and will ask [facility] to fax it.  Dr. Maylon Peppers

## 2019-07-14 NOTE — Telephone Encounter (Signed)
Nutrition Assessment   Reason for Assessment: Patient identified on Malnutrition Screening report for weight loss and poor appetite   ASSESSMENT:  85 year old female with new diagnosis of lung cancer followed by Dr. Maylon Peppers.  Planning to start capmatinib once approved.  Past medical history of HTN, CAD, HLD.  Spoke with patient via phone to introduce self and service at [facility].  Patient reports that she has no appetite and just thinking about food and eating is hard.  Has some nausea but not on regular basis.  Reports that she usually eats 1/2 banana and frozen waffle for breakfast. Later on in the day will eat egg drop soup and drink 1 boost high protein daily.      Nutrition Focused Physical Exam: deferred   Medications: zofran, compazine   Labs: reviewed   Anthropometrics:   Height: 4'11" Weight: 142 lb Noted weight on 10/8 at 150 lb and on 12/19/2018 153 lb BMI: 28  5% weight loss in the last 1 1/2 months  Estimated Energy Needs  Kcals: 1600-1920 Protein: 80-96 g Fluid: > 1.6 L   NUTRITION DIAGNOSIS: Inadequate oral intake related to cancer as evidenced by 5% weight loss in the last 1 1/2 months   INTERVENTION:  Discussed strategies to increase calories and protein. Will mail handout Encouraged trying higher calorie oral nutrition supplement.  Will mail coupons Discussed ways to mix shakes to add more calories and protein Encouraged setting schedule and eating q 2 hours Appetite stimulant may be and option if appetite continues to be decreased and weight continues to decline.  Contact information provided.  Offered to call patient in few weeks but will contact RD if has further questions or concerns   MONITORING, EVALUATION, GOAL: Patient will consume adequate calories and protein to prevent further weight loss   Next Visit: patient to contact RD  Delainee Tramel B. Zenia Resides, [place], [place] Registered Dietitian [phone] (pager)

## 2019-07-16 NOTE — Telephone Encounter (Signed)
Oral Oncology Pharmacist Encounter  I called [employer]O at [phone] to ensure Tabrecta prescription that was faxed on 07/14/19 was received.  They have the prescription and patient was referred with the [employer] Patient [place] ([facility]PA[facility]) on 07/15/2019.  This is the last step before final determination about enrollment into their free drug program. [employer] phone number for follow-up is [phone].  I left voicemail for patient alerting her that application is in final steps and determination should be received in the next 2-3 business days.  This encounter will continue to be updated until final determination.  Johny Drilling, PharmD, BCPS, BCOP  07/16/2019 9:48 AM Oral Oncology Clinic [phone]

## 2019-07-17 NOTE — Telephone Encounter (Signed)
Oral Oncology Pharmacist Encounter  Received notification from the [employer] Patient [place] (NPAF) that patient has been fully enrolled to receive Tabrecta at $0 out of pocket cost for the remainder of the year.  Patient ID: [id number] NPAF phone number: [phone]  Due to short enrollment period, I will submit an application for enrollment for 2021 today. The enrollment application will be documented in a separate encounter.  I called and reported above information to patient. She has received her 14-day trial supply. Patient instructed to wait until she hears about shipment of her full month;'s supply, and then she can start on the 2 week supply of Tabrecta.  Patient expressed understanding and appreciation.  Johny Drilling, PharmD, BCPS, BCOP  07/17/2019 10:24 AM Oral Oncology Clinic [phone]

## 2019-07-20 NOTE — Telephone Encounter (Signed)
That's great news.   Can you please let the patient know to call us ASAP when she receives her medication, so that we can set up a time for follow-up?  Thanks.  Dr. Maylon Peppers

## 2019-07-22 ENCOUNTER — Inpatient Hospital Stay: Payer: Medicare HMO | Attending: Hematology | Admitting: Hematology

## 2019-07-22 ENCOUNTER — Inpatient Hospital Stay: Payer: Medicare HMO

## 2019-07-22 ENCOUNTER — Telehealth: Payer: Self-pay | Admitting: Hematology

## 2019-07-22 ENCOUNTER — Other Ambulatory Visit: Payer: Self-pay

## 2019-07-22 ENCOUNTER — Encounter: Payer: Self-pay | Admitting: Hematology

## 2019-07-22 VITALS — BP 154/75 | HR 91 | Temp 98.0°F | Resp 16 | Ht 59.0 in | Wt 139.2 lb

## 2019-07-22 DIAGNOSIS — Z79899 Other long term (current) drug therapy: Secondary | ICD-10-CM | POA: Insufficient documentation

## 2019-07-22 DIAGNOSIS — Z7982 Long term (current) use of aspirin: Secondary | ICD-10-CM | POA: Diagnosis not present

## 2019-07-22 DIAGNOSIS — R530 Neoplastic (malignant) related fatigue: Secondary | ICD-10-CM | POA: Diagnosis not present

## 2019-07-22 DIAGNOSIS — I6782 Cerebral ischemia: Secondary | ICD-10-CM | POA: Diagnosis not present

## 2019-07-22 DIAGNOSIS — C3491 Malignant neoplasm of unspecified part of right bronchus or lung: Secondary | ICD-10-CM | POA: Diagnosis not present

## 2019-07-22 DIAGNOSIS — I700 Atherosclerosis of aorta: Secondary | ICD-10-CM | POA: Insufficient documentation

## 2019-07-22 DIAGNOSIS — J439 Emphysema, unspecified: Secondary | ICD-10-CM | POA: Insufficient documentation

## 2019-07-22 DIAGNOSIS — G4700 Insomnia, unspecified: Secondary | ICD-10-CM | POA: Diagnosis not present

## 2019-07-22 DIAGNOSIS — R5383 Other fatigue: Secondary | ICD-10-CM | POA: Insufficient documentation

## 2019-07-22 DIAGNOSIS — C3481 Malignant neoplasm of overlapping sites of right bronchus and lung: Secondary | ICD-10-CM | POA: Insufficient documentation

## 2019-07-22 LAB — CBC WITH DIFFERENTIAL (CANCER CENTER ONLY)
Abs Immature Granulocytes: 0.04 10*3/uL (ref 0.00–0.07)
Basophils Absolute: 0 10*3/uL (ref 0.0–0.1)
Basophils Relative: 0 %
Eosinophils Absolute: 0.2 10*3/uL (ref 0.0–0.5)
Eosinophils Relative: 2 %
HCT: 42.3 % (ref 36.0–46.0)
Hemoglobin: 14.2 g/dL (ref 12.0–15.0)
Immature Granulocytes: 0 %
Lymphocytes Relative: 18 %
Lymphs Abs: 1.8 10*3/uL (ref 0.7–4.0)
MCH: 29.7 pg (ref 26.0–34.0)
MCHC: 33.6 g/dL (ref 30.0–36.0)
MCV: 88.5 fL (ref 80.0–100.0)
Monocytes Absolute: 0.8 10*3/uL (ref 0.1–1.0)
Monocytes Relative: 8 %
Neutro Abs: 6.9 10*3/uL (ref 1.7–7.7)
Neutrophils Relative %: 72 %
Platelet Count: 315 10*3/uL (ref 150–400)
RBC: 4.78 MIL/uL (ref 3.87–5.11)
RDW: 13.7 % (ref 11.5–15.5)
WBC Count: 9.7 10*3/uL (ref 4.0–10.5)
nRBC: 0 % (ref 0.0–0.2)

## 2019-07-22 LAB — CMP (CANCER CENTER ONLY)
ALT: 12 U/L (ref 0–44)
AST: 15 U/L (ref 15–41)
Albumin: 3.6 g/dL (ref 3.5–5.0)
Alkaline Phosphatase: 78 U/L (ref 38–126)
Anion gap: 10 (ref 5–15)
BUN: 11 mg/dL (ref 8–23)
CO2: 27 mmol/L (ref 22–32)
Calcium: 9.4 mg/dL (ref 8.9–10.3)
Chloride: 100 mmol/L (ref 98–111)
Creatinine: 0.89 mg/dL (ref 0.44–1.00)
GFR, Est AFR Am: >60 mL/min (ref >60)
GFR, Estimated: 57 mL/min — ABNORMAL LOW (ref >60)
Glucose, Bld: 95 mg/dL (ref 70–99)
Potassium: 3.8 mmol/L (ref 3.5–5.1)
Sodium: 137 mmol/L (ref 135–145)
Total Bilirubin: 0.3 mg/dL (ref 0.3–1.2)
Total Protein: 7.9 g/dL (ref 6.5–8.1)

## 2019-07-22 NOTE — Telephone Encounter (Signed)
Scheduled appt per 12/9 los - gave patient AVS

## 2019-07-22 NOTE — Progress Notes (Signed)
[name] OFFICE PROGRESS NOTE  Patient Care Team: Jo Han, MD as PCP - General (Family Medicine) Jo Sine, MD as PCP - Cardiology (Cardiology)  HEME/ONC OVERVIEW: 1. Stage IIIB [phone]) adenocarcinoma of the R lung, unresectable, MET exon 14 splice variant deletion+ -Late 05/2019:   A large R lung mass extending from the RUL to RLL with mediastinal invasion, R hilar denopathy on CT; no mets in abdomen  Questionable small foci in L frontal and R parietal lobes on MRI brain, limited due to motion degradation; repeat MRI still limited by   Bronch w/ R lung bx, c/w adenocarcinoma; MET exon 14 splice variant deletion+  Large FDG-avid R lung mass with extension to the R hilum and paratracheal LN involvement; no distant mets  -07/2019 - present: palliative capmatinib   TREATMENT SUMMARY:  07/22/2019 - present: capmatinib  ASSESSMENT & PLAN:   Stage IIIB ([id number]) adenocarcinoma of the R lung, unresectable, MET exon 14 splice variant deletion+ -Capmatinib prescription sent to the patient's pharmacy several weeks ago, but the patient has not yet received the medication due to the lengthy process to obtain medication assistance from the manufacturer -Patient received notification from the manufacturer that her medication should arrive by 07/27/2019; in addition, she also has a two-week supply of the medication at home -Labs adequate today and patient was given the instruction to start the medication today -I will see her back in 3 weeks to assess tolerance of the medication  -I have also prescribed PRN Zofran for nausea   Fatigue  -Likely secondary to the underlying lung cancer -See the treatment of lung cancer above -I encouraged the patient to maintain her activity level as tolerated  -I have also ordered TSH at the next visit   Insomnia -No improvement with OTC melatonin -Patient takes several naps during the day, which may interfere with  sleep at night  -I counseled the patient on the importance of sleep hygiene, including minimizing naps during the day -In addition, I recommend her to increase trazodone to 70m qhs PRN   Orders Placed This Encounter  Procedures  . TSH    Standing Status:   Future    Standing Expiration Date:   07/21/2020  . T4, free    Standing Status:   Future    Standing Expiration Date:   07/21/2020   All questions were answered. The patient knows to call the clinic with any problems, questions or concerns. No barriers to learning was detected.  Return in 3 weeks for labs and clinic follow-up.   Jo Men MD 07/22/2019 12:43 PM  CHIEF COMPLAINT: "I still can't sleep"  INTERVAL HISTORY: Ms. RBehanreturns to clinic for follow-up of adenocarcinoma of the lung.  Patient reports that she still has moderate fatigue, limiting her activities during the day.  She tried the trazodone, but still is having difficulty sleeping at night.  According to her son, she takes multiple naps during the day, which may be interfering with her ability to sleep at night.  She otherwise denies any other new complaint today.  REVIEW OF SYSTEMS:   Constitutional: ( - ) fevers, ( - )  chills , ( - ) night sweats Eyes: ( - ) blurriness of vision, ( - ) double vision, ( - ) watery eyes Ears, nose, mouth, throat, and face: ( - ) mucositis, ( - ) sore throat Respiratory: ( - ) cough, ( - ) dyspnea, ( - ) wheezes Cardiovascular: ( - ) palpitation, ( - )  chest discomfort, ( - ) lower extremity swelling Gastrointestinal:  ( - ) nausea, ( - ) heartburn, ( - ) change in bowel habits Skin: ( - ) abnormal skin rashes Lymphatics: ( - ) new lymphadenopathy, ( - ) easy bruising Neurological: ( - ) numbness, ( - ) tingling, ( - ) new weaknesses Behavioral/Psych: ( - ) mood change, ( - ) new changes  All other systems were reviewed with the patient and are negative.  SUMMARY OF ONCOLOGIC HISTORY: Oncology History  Adenocarcinoma of  right lung ([place])  06/08/2019 Imaging   CT chest: IMPRESSION: 1. Central RIGHT UPPER LOBE lung mass with mediastinal extension as described above and associated post-obstructive atelectasis and pneumonitis in the RIGHT UPPER LOBE, associated with necrosis. 2. Second mass is suspected in the superior segment LEFT LOWER LOBE with associated pneumonitis and necrosis. 3. RIGHT hilar lymphadenopathy, likely metastatic. 4. No evidence of metastatic disease elsewhere. 5. Approximate 1.2 cm RIGHT lobe thyroid nodule, not present on the 2008 CT. Thyroid ultrasound may be confirmatory.   06/09/2019 Initial Diagnosis   Lung cancer ([place])   06/09/2019 Imaging   MRI brain: IMPRESSION: 1. Severely motion degraded study, which limits the detection of metastatic lesions. The below findings are nonspecific due to the degree of artifact and repeating the examination should be considered when the patient can be made more comfortable and able to tolerate the length of the study. 2. Punctate focus of abnormal diffusion restriction within the anterior right parietal lobe may indicate a small area of acute ischemia. Small metastases can also cause diffusion restriction, but there is no corresponding contrast enhancement visible. 3. 2 small foci of contrast enhancement within the left frontal lobe and right parietal lobe. These may be vascular, though small metastases could have this appearance.IMPRESSION: 1. Severely motion degraded study, which limits the detection of metastatic lesions. The below findings are nonspecific due to the degree of artifact and repeating the examination should be considered when the patient can be made more comfortable and able to tolerate the length of the study. 2. Punctate focus of abnormal diffusion restriction within the anterior right parietal lobe may indicate a small area of acute ischemia. Small metastases can also cause diffusion restriction, but there is no  corresponding contrast enhancement visible. 3. 2 small foci of contrast enhancement within the left frontal lobe and right parietal lobe. These may be vascular, though small metastases could have this appearance.   06/09/2019 Imaging   CT abdomen/pelvis: IMPRESSION: 1. No CT findings for metastatic disease involving the abdomen/pelvis or bony structures. 2. Status post cholecystectomy with associated mild biliary dilatation. 3. Advanced atherosclerotic calcifications involving the aorta and iliac arteries but no aneurysm or dissection.   06/26/2019 Imaging   PET:  IMPRESSION: 1. Hypermetabolic right upper/right lower lobe thick-walled cavitary mass with a hypermetabolic ipsilateral low right paratracheal lymph node, findings most consistent with primary bronchogenic carcinoma (T4 N2 M0 or stage IIIB disease). 2. Aortic atherosclerosis (ICD10-170.0). Coronary artery calcification. 3.  Emphysema (ICD10-J43.9).   06/26/2019 Imaging   MRI brain w/o contrast: IMPRESSION: The patient was able to hold still for the unenhanced images however was moving during the postcontrast on the images, limiting evaluation for metastatic disease   No definite metastatic deposits identified   Atrophy and chronic microvascular ischemic change in the white matter.   07/22/2019 Cancer Staging   Staging form: Lung, AJCC 8th Edition - Clinical: Stage IIIB (cT4, cN2, cM0) - Signed by Tish Men, MD on 07/22/2019  I have reviewed the past medical history, past surgical history, social history and family history with the patient and they are unchanged from previous note.  ALLERGIES:  is allergic to acetaminophen; benadryl [diphenhydramine]; zetia [ezetimibe]; gabapentin; hydrochlorothiazide; ace inhibitors; codeine; erythromycin; etodolac; and ivp dye [iodinated diagnostic agents].  MEDICATIONS:  Current Outpatient Medications  Medication Sig Dispense Refill  . amLODipine (NORVASC) 10 MG tablet  Take 1 tablet (10 mg total) by mouth daily. 90 tablet 1  . aspirin 81 MG tablet Take 81 mg by mouth daily.    . Calcium 1500 MG tablet Take 1,500 mg by mouth daily.    . cholecalciferol (VITAMIN D) 1000 UNITS tablet Take 1,000 Units by mouth daily. Take 1 tab daily    . NON FORMULARY at bedtime. BiPAP    . Omega-3 Fatty Acids (FISH OIL) 1000 MG CAPS Take 1 capsule by mouth daily.     . ondansetron (ZOFRAN) 8 MG tablet Take 1 tablet (8 mg total) by mouth every 8 (eight) hours as needed for nausea or vomiting. 40 tablet 3  . prochlorperazine (COMPAZINE) 10 MG tablet Take 1 tablet (10 mg total) by mouth every 6 (six) hours as needed for nausea or vomiting. 30 tablet 0  . RESTASIS 0.05 % ophthalmic emulsion Place 1 drop into both eyes 2 (two) times daily. Use 1 drop in each eye twice a day    . rosuvastatin (CRESTOR) 5 MG tablet TAKE 1 TABLET EVERY OTHER DAY (Patient taking differently: Take 5 mg by mouth See admin instructions. ) 45 tablet 1  . traZODone (DESYREL) 50 MG tablet Take 1 tablet (50 mg total) by mouth at bedtime as needed for sleep. 30 tablet 5  . capmatinib (TABRECTA) 200 MG tablet Take 2 tablets (400 mg total) by mouth 2 (two) times daily. (Patient not taking: Reported on 07/22/2019) 112 tablet 11   No current facility-administered medications for this visit.     PHYSICAL EXAMINATION: ECOG PERFORMANCE STATUS: 2 - Symptomatic, <50% confined to bed  Today's Vitals   07/22/19 1120 07/22/19 1124  BP: (!) 154/75   Pulse: 91   Resp: 16   Temp: 98 F (36.7 C)   TempSrc: Temporal   SpO2: 97%   Weight: 139 lb 3.2 oz (63.1 kg)   Height: '4\' 11"'  (1.499 m)   PainSc:  0-No pain   Body mass index is 28.11 kg/m.  Filed Weights   07/22/19 1120  Weight: 139 lb 3.2 oz (63.1 kg)    GENERAL: alert, no distress and comfortable, younger than stated age  SKIN: skin color, texture, turgor are normal, no rashes or significant lesions EYES: conjunctiva are pink and non-injected, sclera  clear OROPHARYNX: no exudate, no erythema; lips, buccal mucosa, and tongue normal  NECK: supple, non-tender LUNGS: clear to auscultation with normal breathing effort HEART: regular rate & rhythm and no murmurs and no lower extremity edema ABDOMEN: soft, non-tender, non-distended, normal bowel sounds Musculoskeletal: no cyanosis of digits and no clubbing  PSYCH: alert & oriented x 3, fluent speech  LABORATORY DATA:  I have reviewed the data as listed    Component Value Date/Time   NA 137 07/22/2019 1055   NA 140 09/22/2018 1102   K 3.8 07/22/2019 1055   CL 100 07/22/2019 1055   CO2 27 07/22/2019 1055   GLUCOSE 95 07/22/2019 1055   BUN 11 07/22/2019 1055   BUN 15 09/22/2018 1102   CREATININE 0.89 07/22/2019 1055   CREATININE 1.35 (H) 01/16/2017 3532  CALCIUM 9.4 07/22/2019 1055   PROT 7.9 07/22/2019 1055   PROT 6.8 09/22/2018 1102   ALBUMIN 3.6 07/22/2019 1055   ALBUMIN 3.9 09/22/2018 1102   AST 15 07/22/2019 1055   ALT 12 07/22/2019 1055   ALKPHOS 78 07/22/2019 1055   BILITOT 0.3 07/22/2019 1055   GFRNONAA 57 (L) 07/22/2019 1055   GFRAA >60 07/22/2019 1055    No results found for: SPEP, UPEP  Lab Results  Component Value Date   WBC 9.7 07/22/2019   NEUTROABS 6.9 07/22/2019   HGB 14.2 07/22/2019   HCT 42.3 07/22/2019   MCV 88.5 07/22/2019   PLT 315 07/22/2019      Chemistry      Component Value Date/Time   NA 137 07/22/2019 1055   NA 140 09/22/2018 1102   K 3.8 07/22/2019 1055   CL 100 07/22/2019 1055   CO2 27 07/22/2019 1055   BUN 11 07/22/2019 1055   BUN 15 09/22/2018 1102   CREATININE 0.89 07/22/2019 1055   CREATININE 1.35 (H) 01/16/2017 0817      Component Value Date/Time   CALCIUM 9.4 07/22/2019 1055   ALKPHOS 78 07/22/2019 1055   AST 15 07/22/2019 1055   ALT 12 07/22/2019 1055   BILITOT 0.3 07/22/2019 1055       RADIOGRAPHIC STUDIES: I have personally reviewed the radiological images as listed below and agreed with the findings in the  report. Mr Jeri Cos Wo Contrast  Result Date: 06/26/2019 CLINICAL DATA:  Non-small-cell lung cancer staging. Possible metastatic deposits on prior MRI degraded by motion EXAM: MRI HEAD WITHOUT AND WITH CONTRAST TECHNIQUE: Multiplanar, multiecho pulse sequences of the brain and surrounding structures were obtained without and with intravenous contrast. CONTRAST:  6.60m GADAVIST GADOBUTROL 1 MMOL/ML IV SOLN COMPARISON:  MRI head 06/09/2019 FINDINGS: Brain: Unenhanced images are of good quality without significant motion however the patient had difficulty holding still for the postcontrast images. Area of restricted diffusion in the right parietal white matter no longer visualized Previously study noted areas of enhancement in the left frontal lobe and right parietal lobe. These appear to be related to vascular enhancement. No mass lesion identified. No definite metastatic deposits.  Evaluation limited by motion. Ventricle size normal. Negative for acute infarct. Chronic microvascular ischemic changes in the white matter. Generalized atrophy. Negative for hemorrhage. Vascular: Normal arterial flow voids Skull and upper cervical spine: No focal skeletal lesion. Sinuses/Orbits: Paranasal sinuses clear.  Bilateral cataract surgery Other: None IMPRESSION: The patient was able to hold still for the unenhanced images however was moving during the postcontrast on the images, limiting evaluation for metastatic disease No definite metastatic deposits identified Atrophy and chronic microvascular ischemic change in the white matter. Electronically Signed   By: CFranchot GalloM.D.   On: 06/26/2019 13:38   Nm Pet Image Initial (pi) Skull Base To Thigh  Result Date: 06/26/2019 CLINICAL DATA:  Initial treatment strategy for lung cancer. EXAM: NUCLEAR MEDICINE PET SKULL BASE TO THIGH TECHNIQUE: 7.6 mCi F-18 FDG was injected intravenously. Full-ring PET imaging was performed from the skull base to thigh after the radiotracer.  CT data was obtained and used for attenuation correction and anatomic localization. Fasting blood glucose: 95 mg/dl COMPARISON:  CT abdomen pelvis 06/09/2019 and CT chest 06/08/2019. FINDINGS: Mediastinal blood pool activity: SUV max 2.9 Liver activity: SUV max NA NECK: No abnormal hypermetabolism. Incidental CT findings: None. CHEST: Masslike consolidation and cavitation in the right upper and right lower lobes collectively measures at least 9.0  x 9.4 cm (4/50) with an SUV max 17.7. There is extension to, and involvement of, the right hilum. Hypermetabolic low right paratracheal lymph node measures 11 mm with an SUV max of 10.3. No hypermetabolic axillary or left hilar adenopathy. Incidental CT findings: Atherosclerotic calcification of the aorta and coronary arteries. Heart size within normal limits. No pericardial or pleural effusion. Centrilobular emphysema. ABDOMEN/PELVIS: No abnormal hypermetabolism in the liver, adrenal glands, spleen or pancreas. No hypermetabolic lymph nodes. Incidental CT findings: Liver is unremarkable. Cholecystectomy. Adrenal glands are unremarkable. Residual contrast in the renal collecting systems and bladder. Low-attenuation lesions in the kidneys measure up to 1.7 cm off lower pole left kidney and are likely cysts. 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 Spleen, pancreas, stomach and bowel are unremarkable. Atherosclerotic calcification of the aorta without aneurysm. SKELETON: No abnormal osseous hypermetabolism. Incidental CT findings: Degenerative changes in the spine. IMPRESSION: 1. Hypermetabolic right upper/right lower lobe thick-walled cavitary mass with a hypermetabolic ipsilateral low right paratracheal lymph node, findings most consistent with primary bronchogenic carcinoma (T4 N2 M0 or stage IIIB disease). 2. Aortic atherosclerosis (ICD10-170.0). Coronary artery calcification. 3.  Emphysema (ICD10-J43.9).  Electronically Signed   By: Lorin Picket M.D.   On: 06/26/2019 14:46

## 2019-08-12 ENCOUNTER — Other Ambulatory Visit: Payer: Medicare HMO

## 2019-08-12 ENCOUNTER — Ambulatory Visit: Payer: Medicare HMO | Admitting: Hematology

## 2019-08-12 ENCOUNTER — Telehealth: Payer: Self-pay | Admitting: Hematology

## 2019-08-12 NOTE — Telephone Encounter (Signed)
Called pt per 12/30 sch message - unable to reach pt . r/s and left message with appt date and time  And call back number

## 2019-08-13 ENCOUNTER — Ambulatory Visit: Payer: Medicare HMO | Attending: Internal Medicine

## 2019-08-13 DIAGNOSIS — Z20822 Contact with and (suspected) exposure to covid-19: Secondary | ICD-10-CM

## 2019-08-15 LAB — NOVEL CORONAVIRUS, NAA: SARS-CoV-2, NAA: NOT DETECTED

## 2019-08-19 ENCOUNTER — Telehealth: Payer: Self-pay | Admitting: *Deleted

## 2019-08-19 ENCOUNTER — Inpatient Hospital Stay: Payer: Medicare HMO | Attending: Hematology

## 2019-08-19 ENCOUNTER — Inpatient Hospital Stay: Payer: Medicare HMO | Admitting: Hematology

## 2019-08-19 ENCOUNTER — Other Ambulatory Visit: Payer: Self-pay | Admitting: *Deleted

## 2019-08-19 ENCOUNTER — Encounter: Payer: Self-pay | Admitting: Hematology

## 2019-08-19 ENCOUNTER — Other Ambulatory Visit: Payer: Self-pay

## 2019-08-19 VITALS — BP 137/71 | HR 94 | Temp 97.9°F | Resp 18 | Ht 59.0 in | Wt 139.6 lb

## 2019-08-19 DIAGNOSIS — I2510 Atherosclerotic heart disease of native coronary artery without angina pectoris: Secondary | ICD-10-CM | POA: Diagnosis not present

## 2019-08-19 DIAGNOSIS — C3481 Malignant neoplasm of overlapping sites of right bronchus and lung: Secondary | ICD-10-CM | POA: Insufficient documentation

## 2019-08-19 DIAGNOSIS — Z79899 Other long term (current) drug therapy: Secondary | ICD-10-CM | POA: Diagnosis not present

## 2019-08-19 DIAGNOSIS — R110 Nausea: Secondary | ICD-10-CM | POA: Diagnosis not present

## 2019-08-19 DIAGNOSIS — C3491 Malignant neoplasm of unspecified part of right bronchus or lung: Secondary | ICD-10-CM

## 2019-08-19 DIAGNOSIS — J439 Emphysema, unspecified: Secondary | ICD-10-CM | POA: Diagnosis not present

## 2019-08-19 DIAGNOSIS — Z7982 Long term (current) use of aspirin: Secondary | ICD-10-CM | POA: Diagnosis not present

## 2019-08-19 DIAGNOSIS — G4700 Insomnia, unspecified: Secondary | ICD-10-CM | POA: Insufficient documentation

## 2019-08-19 DIAGNOSIS — R112 Nausea with vomiting, unspecified: Secondary | ICD-10-CM

## 2019-08-19 DIAGNOSIS — R5383 Other fatigue: Secondary | ICD-10-CM | POA: Diagnosis not present

## 2019-08-19 DIAGNOSIS — H538 Other visual disturbances: Secondary | ICD-10-CM | POA: Diagnosis not present

## 2019-08-19 DIAGNOSIS — I700 Atherosclerosis of aorta: Secondary | ICD-10-CM | POA: Insufficient documentation

## 2019-08-19 DIAGNOSIS — R530 Neoplastic (malignant) related fatigue: Secondary | ICD-10-CM

## 2019-08-19 LAB — CBC WITH DIFFERENTIAL (CANCER CENTER ONLY)
Abs Immature Granulocytes: 0.02 10*3/uL (ref 0.00–0.07)
Basophils Absolute: 0 10*3/uL (ref 0.0–0.1)
Basophils Relative: 0 %
Eosinophils Absolute: 0.2 10*3/uL (ref 0.0–0.5)
Eosinophils Relative: 2 %
HCT: 39 % (ref 36.0–46.0)
Hemoglobin: 13.2 g/dL (ref 12.0–15.0)
Immature Granulocytes: 0 %
Lymphocytes Relative: 25 %
Lymphs Abs: 2.4 10*3/uL (ref 0.7–4.0)
MCH: 29.7 pg (ref 26.0–34.0)
MCHC: 33.8 g/dL (ref 30.0–36.0)
MCV: 87.6 fL (ref 80.0–100.0)
Monocytes Absolute: 0.6 10*3/uL (ref 0.1–1.0)
Monocytes Relative: 6 %
Neutro Abs: 6.3 10*3/uL (ref 1.7–7.7)
Neutrophils Relative %: 67 %
Platelet Count: 278 10*3/uL (ref 150–400)
RBC: 4.45 MIL/uL (ref 3.87–5.11)
RDW: 14.3 % (ref 11.5–15.5)
WBC Count: 9.5 10*3/uL (ref 4.0–10.5)
nRBC: 0 % (ref 0.0–0.2)

## 2019-08-19 LAB — CMP (CANCER CENTER ONLY)
ALT: 18 U/L (ref 0–44)
AST: 16 U/L (ref 15–41)
Albumin: 3.2 g/dL — ABNORMAL LOW (ref 3.5–5.0)
Alkaline Phosphatase: 83 U/L (ref 38–126)
Anion gap: 11 (ref 5–15)
BUN: 8 mg/dL (ref 8–23)
CO2: 24 mmol/L (ref 22–32)
Calcium: 8.3 mg/dL — ABNORMAL LOW (ref 8.9–10.3)
Chloride: 104 mmol/L (ref 98–111)
Creatinine: 1.09 mg/dL — ABNORMAL HIGH (ref 0.44–1.00)
GFR, Est AFR Am: 52 mL/min — ABNORMAL LOW (ref >60)
GFR, Estimated: 45 mL/min — ABNORMAL LOW (ref >60)
Glucose, Bld: 103 mg/dL — ABNORMAL HIGH (ref 70–99)
Potassium: 3.6 mmol/L (ref 3.5–5.1)
Sodium: 139 mmol/L (ref 135–145)
Total Bilirubin: 0.5 mg/dL (ref 0.3–1.2)
Total Protein: 6.8 g/dL (ref 6.5–8.1)

## 2019-08-19 LAB — T4, FREE: Free T4: 1.06 ng/dL (ref 0.61–1.12)

## 2019-08-19 MED ORDER — TRAZODONE HCL 50 MG PO TABS: 100.0000 mg | ORAL_TABLET | Freq: Every evening | ORAL | 3 refills | Status: DC | PRN

## 2019-08-19 MED ORDER — TEMAZEPAM 15 MG PO CAPS: 15.0000 mg | ORAL_CAPSULE | Freq: Every evening | ORAL | 1 refills | Status: DC | PRN

## 2019-08-19 MED ORDER — PREDNISONE 50 MG PO TABS: ORAL_TABLET | ORAL | 0 refills | Status: DC

## 2019-08-19 MED ORDER — DIPHENHYDRAMINE HCL 25 MG PO CAPS: 25.0000 mg | ORAL_CAPSULE | Freq: Once | ORAL | 0 refills | Status: DC

## 2019-08-19 NOTE — Telephone Encounter (Signed)
Spoke with pt and instructed pt to STOP  Trazodone today;  Start Temazepam as needed for sleep as per Dr. Maylon Peppers.  Informed pt that CT prep medications will be sent in to [facility].  Gave pt verbal instructions on when to take Prednisone 50 mg  13 hours, 7 hours, and 1 hour prior to CT scan. Benadryl 25 mg 1 hour prior to CT scan. Pt stated when she took Benadryl 50 mg before, she was feeling very drowsy and " loopy ".  Dr. Maylon Peppers aware of Benadryl effects on pt - only 25 mg Benadryl was ordered prior to scan. Pt voiced understanding.

## 2019-08-19 NOTE — Progress Notes (Signed)
[facility] OFFICE PROGRESS NOTE  Patient Care Team: Buzzy Han, MD as PCP - General (Family Medicine) Troy Sine, MD as PCP - Cardiology (Cardiology)  HEME/ONC OVERVIEW: 1. Stage IIIB [phone]) adenocarcinoma of the R lung, unresectable, MET exon 14 splice variant deletion+ -Late 05/2019:   A large R lung mass extending from the RUL to RLL with mediastinal invasion, R hilar denopathy on CT; no mets in abdomen  Questionable small foci in L frontal and R parietal lobes on MRI brain, limited due to motion degradation; repeat MRI still limited by   Bronch w/ R lung bx, c/w adenocarcinoma; MET exon 14 splice variant deletion+  Large FDG-avid R lung mass with extension to the R hilum and paratracheal LN involvement; no distant mets  -07/2019 - present: palliative capmatinib   TREATMENT SUMMARY:  07/22/2019 - present: capmatinib 4107m BID   ASSESSMENT & PLAN:   Stage IIIB (([id number] adenocarcinoma of the R lung, unresectable, MET exon 14 splice variant deletion+ -Patient is tolerating capmatinib relatively well, except photosensitivity in the R eye, fatigue and some nausea (see the management of toxicities below) -Labs adequate today, continue capmatinib 4037mBID for now  -I have ordered CT chest, abdomen/pelvis prior to the next visit to assess interim disease response  -Due to the patient's hx of contrast allergy, I have reached out to radiology regarding premedications (prednisone and Benadryl 2574m-I will see her back in 4 weeks to go over the CT results and monitor toxicity   Photosensitivity -Possibly medication induced (trazodone vs capmatinib) -Patient reports new onset "glare" in the R eye with mild blurry vision; no diplopia -No focal neurologic deficits, such as sensation or strength changes in the extremities; CN's normal -I encouraged the patient to contact her ophthalmologist for eye exam -In addition, I recommended the patient to stop  trazodone  -If her photosensitivity worsens despite stopping trazodone, we may need to reduce the dose of capmatinib    Nausea -Possibly due to capmatinib vs trazodone  -Symptoms controlled with Compazine if the patient takes the anti-emetics in time -I recommended the patient to try prophylactic anti-emetic, especially if her symptoms are mostly in the morning   Fatigue  -Likely secondary to the underlying lung cancer and capmatinib -Mildly exacerbated since treatment, but her activity level remains reasonable -TSH, free T4 pending today -We will monitor it for now   Insomnia -Overall stable/improving with trazodone; currently taking 100m31ms PRN -I cautioned the patient that high-dose trazodone can be associated with increased side effects, including dizziness and blurry vision  -Therefore, I recommended the patient to hold trazodone, and I have sent a prescription for Restoril PRN to the patient's pharmacy  -Continue follow-up with PCP   No orders of the defined types were placed in this encounter.  All questions were answered. The patient knows to call the clinic with any problems, questions or concerns. No barriers to learning was detected.  Return in 4 weeks for labs, imaging results and clinic appt.  Anarely Nicholls Tish Men 08/19/2019 3:55 PM  CHIEF COMPLAINT: "I am doing okay"  INTERVAL HISTORY: [facility] for follow-up of locally advanced lung cancer on capmatinib.  Patient reports that over the past 3 weeks, she has developed a "glare" in her right eye and some blurry vision.  She denies any double vision or other focal neurologic symptoms, such as change in sensation or strength in the lower extremities.  She denies any headaches.  She feels that her  fatigue is slightly worse, but her exertional dyspnea remains about the same (walking limited to 15 to 20 feet).  She has periodic nausea, for which she takes Compazine with relief, but if she does not take the medication in  time, she feels "queasy" and lightheaded.  She denies any fall or syncope.  Her insomnia is slightly improving with trazodone 100 mg at bedtime as needed.  REVIEW OF SYSTEMS:   Constitutional: ( - ) fevers, ( - )  chills , ( - ) night sweats Eyes: ( + ) blurriness of vision, ( - ) double vision, ( - ) watery eyes Ears, nose, mouth, throat, and face: ( - ) mucositis, ( - ) sore throat Respiratory: ( - ) cough, ( + ) dyspnea, ( - ) wheezes Cardiovascular: ( - ) palpitation, ( - ) chest discomfort, ( - ) lower extremity swelling Gastrointestinal:  ( + ) nausea, ( - ) heartburn, ( - ) change in bowel habits Skin: ( - ) abnormal skin rashes Lymphatics: ( - ) new lymphadenopathy, ( - ) easy bruising Neurological: ( - ) numbness, ( - ) tingling, ( - ) new weaknesses Behavioral/Psych: ( - ) mood change, ( - ) new changes  All other systems were reviewed with the patient and are negative.  SUMMARY OF ONCOLOGIC HISTORY: Oncology History  Adenocarcinoma of right lung ([place])  06/08/2019 Imaging   CT chest: IMPRESSION: 1. Central RIGHT UPPER LOBE lung mass with mediastinal extension as described above and associated post-obstructive atelectasis and pneumonitis in the RIGHT UPPER LOBE, associated with necrosis. 2. Second mass is suspected in the superior segment LEFT LOWER LOBE with associated pneumonitis and necrosis. 3. RIGHT hilar lymphadenopathy, likely metastatic. 4. No evidence of metastatic disease elsewhere. 5. Approximate 1.2 cm RIGHT lobe thyroid nodule, not present on the 2008 CT. Thyroid ultrasound may be confirmatory.   06/09/2019 Initial Diagnosis   Lung cancer ([place])   06/09/2019 Imaging   MRI brain: IMPRESSION: 1. Severely motion degraded study, which limits the detection of metastatic lesions. The below findings are nonspecific due to the degree of artifact and repeating the examination should be considered when the patient can be made more comfortable and able to tolerate  the length of the study. 2. Punctate focus of abnormal diffusion restriction within the anterior right parietal lobe may indicate a small area of acute ischemia. Small metastases can also cause diffusion restriction, but there is no corresponding contrast enhancement visible. 3. 2 small foci of contrast enhancement within the left frontal lobe and right parietal lobe. These may be vascular, though small metastases could have this appearance.IMPRESSION: 1. Severely motion degraded study, which limits the detection of metastatic lesions. The below findings are nonspecific due to the degree of artifact and repeating the examination should be considered when the patient can be made more comfortable and able to tolerate the length of the study. 2. Punctate focus of abnormal diffusion restriction within the anterior right parietal lobe may indicate a small area of acute ischemia. Small metastases can also cause diffusion restriction, but there is no corresponding contrast enhancement visible. 3. 2 small foci of contrast enhancement within the left frontal lobe and right parietal lobe. These may be vascular, though small metastases could have this appearance.   06/09/2019 Imaging   CT abdomen/pelvis: IMPRESSION: 1. No CT findings for metastatic disease involving the abdomen/pelvis or bony structures. 2. Status post cholecystectomy with associated mild biliary dilatation. 3. Advanced atherosclerotic calcifications involving the aorta and iliac  arteries but no aneurysm or dissection.   06/26/2019 Imaging   PET:  IMPRESSION: 1. Hypermetabolic right upper/right lower lobe thick-walled cavitary mass with a hypermetabolic ipsilateral low right paratracheal lymph node, findings most consistent with primary bronchogenic carcinoma (T4 N2 M0 or stage IIIB disease). 2. Aortic atherosclerosis (ICD10-170.0). Coronary artery calcification. 3.  Emphysema (ICD10-J43.9).   06/26/2019 Imaging   MRI  brain w/o contrast: IMPRESSION: The patient was able to hold still for the unenhanced images however was moving during the postcontrast on the images, limiting evaluation for metastatic disease   No definite metastatic deposits identified   Atrophy and chronic microvascular ischemic change in the white matter.   07/22/2019 Cancer Staging   Staging form: Lung, AJCC 8th Edition - Clinical: Stage IIIB (cT4, cN2, cM0) - Signed by Tish Men, MD on 07/22/2019     I have reviewed the past medical history, past surgical history, social history and family history with the patient and they are unchanged from previous note.  ALLERGIES:  is allergic to acetaminophen; benadryl [diphenhydramine]; zetia [ezetimibe]; gabapentin; hydrochlorothiazide; ace inhibitors; codeine; erythromycin; etodolac; and ivp dye [iodinated diagnostic agents].  MEDICATIONS:  Current Outpatient Medications  Medication Sig Dispense Refill  . amLODipine (NORVASC) 10 MG tablet Take 1 tablet (10 mg total) by mouth daily. 90 tablet 1  . aspirin 81 MG tablet Take 81 mg by mouth daily.    . Calcium 1500 MG tablet Take 1,500 mg by mouth daily.    . capmatinib (TABRECTA) 200 MG tablet Take 2 tablets (400 mg total) by mouth 2 (two) times daily. 112 tablet 11  . cholecalciferol (VITAMIN D) 1000 UNITS tablet Take 1,000 Units by mouth daily. Take 1 tab daily    . NON FORMULARY at bedtime. BiPAP    . Omega-3 Fatty Acids (FISH OIL) 1000 MG CAPS Take 1 capsule by mouth daily.     . prochlorperazine (COMPAZINE) 10 MG tablet Take 1 tablet (10 mg total) by mouth every 6 (six) hours as needed for nausea or vomiting. 30 tablet 0  . RESTASIS 0.05 % ophthalmic emulsion Place 1 drop into both eyes 2 (two) times daily. Use 1 drop in each eye twice a day    . rosuvastatin (CRESTOR) 5 MG tablet TAKE 1 TABLET EVERY OTHER DAY (Patient taking differently: Take 5 mg by mouth See admin instructions. ) 45 tablet 1  . traZODone (DESYREL) 50 MG tablet Take  1 tablet (50 mg total) by mouth at bedtime as needed for sleep. (Patient taking differently: Take 100 mg by mouth at bedtime as needed for sleep. ) 30 tablet 5  . ondansetron (ZOFRAN) 8 MG tablet Take 1 tablet (8 mg total) by mouth every 8 (eight) hours as needed for nausea or vomiting. (Patient not taking: Reported on 08/19/2019) 40 tablet 3   No current facility-administered medications for this visit.    PHYSICAL EXAMINATION: ECOG PERFORMANCE STATUS: 2 - Symptomatic, <50% confined to bed  Today's Vitals   08/19/19 1547 08/19/19 1550  BP: 137/71   Pulse: 94   Resp: 18   Temp: 97.9 F (36.6 C)   TempSrc: Temporal   SpO2: 99%   Weight: 139 lb 9.6 oz (63.3 kg)   Height: '4\' 11"'  (1.499 m)   PainSc:  0-No pain   Body mass index is 28.2 kg/m.  Filed Weights   08/19/19 1547  Weight: 139 lb 9.6 oz (63.3 kg)    GENERAL: alert, no distress and comfortable, younger than stated age  SKIN:  skin color, texture, turgor are normal, no rashes or significant lesions EYES: conjunctiva are pink and non-injected, sclera clear OROPHARYNX: no exudate, no erythema; lips, buccal mucosa, and tongue normal  NECK: supple, non-tender LUNGS: clear to auscultation with normal breathing effort HEART: regular rate & rhythm and no murmurs and 1+ bilateral lower extremity edema ABDOMEN: soft, non-tender, non-distended, normal bowel sounds Musculoskeletal: no cyanosis of digits and no clubbing  PSYCH: alert & oriented x 3, fluent speech  LABORATORY DATA:  I have reviewed the data as listed    Component Value Date/Time   NA 137 07/22/2019 1055   NA 140 09/22/2018 1102   K 3.8 07/22/2019 1055   CL 100 07/22/2019 1055   CO2 27 07/22/2019 1055   GLUCOSE 95 07/22/2019 1055   BUN 11 07/22/2019 1055   BUN 15 09/22/2018 1102   CREATININE 0.89 07/22/2019 1055   CREATININE 1.35 (H) 01/16/2017 0817   CALCIUM 9.4 07/22/2019 1055   PROT 7.9 07/22/2019 1055   PROT 6.8 09/22/2018 1102   ALBUMIN 3.6 07/22/2019  1055   ALBUMIN 3.9 09/22/2018 1102   AST 15 07/22/2019 1055   ALT 12 07/22/2019 1055   ALKPHOS 78 07/22/2019 1055   BILITOT 0.3 07/22/2019 1055   GFRNONAA 57 (L) 07/22/2019 1055   GFRAA >60 07/22/2019 1055    No results found for: SPEP, UPEP  Lab Results  Component Value Date   WBC 9.5 08/19/2019   NEUTROABS 6.3 08/19/2019   HGB 13.2 08/19/2019   HCT 39.0 08/19/2019   MCV 87.6 08/19/2019   PLT 278 08/19/2019      Chemistry      Component Value Date/Time   NA 137 07/22/2019 1055   NA 140 09/22/2018 1102   K 3.8 07/22/2019 1055   CL 100 07/22/2019 1055   CO2 27 07/22/2019 1055   BUN 11 07/22/2019 1055   BUN 15 09/22/2018 1102   CREATININE 0.89 07/22/2019 1055   CREATININE 1.35 (H) 01/16/2017 0817      Component Value Date/Time   CALCIUM 9.4 07/22/2019 1055   ALKPHOS 78 07/22/2019 1055   AST 15 07/22/2019 1055   ALT 12 07/22/2019 1055   BILITOT 0.3 07/22/2019 1055       RADIOGRAPHIC STUDIES: I have personally reviewed the radiological images as listed below and agreed with the findings in the report. No results found.

## 2019-08-20 LAB — TSH: TSH: 0.732 u[IU]/mL (ref 0.308–3.960)

## 2019-08-24 ENCOUNTER — Other Ambulatory Visit: Payer: Self-pay | Admitting: *Deleted

## 2019-08-24 ENCOUNTER — Other Ambulatory Visit: Payer: Self-pay | Admitting: Pharmacist

## 2019-08-24 DIAGNOSIS — C3491 Malignant neoplasm of unspecified part of right bronchus or lung: Secondary | ICD-10-CM

## 2019-08-24 MED ORDER — ROSUVASTATIN CALCIUM 5 MG PO TABS: 5.0000 mg | ORAL_TABLET | ORAL | 2 refills | Status: DC

## 2019-08-24 MED ORDER — CAPMATINIB HCL 200 MG PO TABS: 400.0000 mg | ORAL_TABLET | Freq: Two times a day (BID) | ORAL | 11 refills | Status: DC

## 2019-08-24 MED ORDER — AMLODIPINE BESYLATE 10 MG PO TABS: 10.0000 mg | ORAL_TABLET | Freq: Every day | ORAL | 2 refills | Status: DC

## 2019-08-24 NOTE — Telephone Encounter (Signed)
Rx has been sent to the pharmacy electronically. ° °

## 2019-08-24 NOTE — Progress Notes (Signed)
Oral Chemotherapy Pharmacist Encounter   Refill prescription sent to [facility]. Patient enrolled in patient assistance through [employer] for free medication. Prescription redirected to [facility].  Darl Pikes, PharmD, BCPS, [facility] Hematology/Oncology Clinical Pharmacist [facility] Oral [facility] [phone]  08/24/2019 4:56 PM

## 2019-08-26 ENCOUNTER — Other Ambulatory Visit: Payer: Self-pay | Admitting: *Deleted

## 2019-08-26 DIAGNOSIS — R110 Nausea: Secondary | ICD-10-CM

## 2019-08-26 DIAGNOSIS — C3491 Malignant neoplasm of unspecified part of right bronchus or lung: Secondary | ICD-10-CM

## 2019-08-26 MED ORDER — ONDANSETRON HCL 8 MG PO TABS: 8.0000 mg | ORAL_TABLET | Freq: Three times a day (TID) | ORAL | 2 refills | Status: DC | PRN

## 2019-08-26 MED ORDER — PROCHLORPERAZINE MALEATE 10 MG PO TABS: 10.0000 mg | ORAL_TABLET | Freq: Four times a day (QID) | ORAL | 3 refills | Status: DC | PRN

## 2019-08-26 NOTE — Telephone Encounter (Signed)
Patient called to let us know that [employer] contacted her to let her know she had been approved until 08/12/20 and they scheduled her next delivery of Tabrecta.  [place] Patient [place] Phone [phone] Fax [phone] 08/26/2019 10:21 AM

## 2019-08-30 IMAGING — MR MR HEAD W/O CM
9 of 11 series · 34 of 48 positions shown · non-contrast
Comparison: Head CT 07/26/2017

Brain MRI 05/15/2010

CLINICAL DATA: Vertigo

EXAM:
MRI HEAD WITHOUT CONTRAST
TECHNIQUE: Multiplanar, multiecho pulse sequences of the brain and surrounding
structures were obtained without intravenous contrast.

[Series 3: DWI · axial · 3.0mm · 1.09mm/px · z∈[-65,+72]mm · 8 of 94 slices shown (1 of 4)]
[im 1/94]
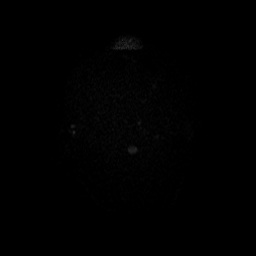
[im 11/94]
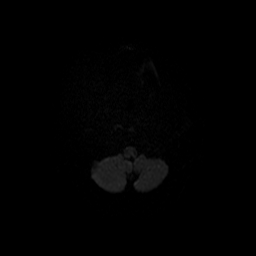
[im 32/94]
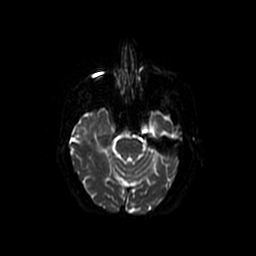
[im 42/94]
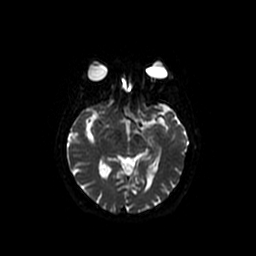
[im 52/94]
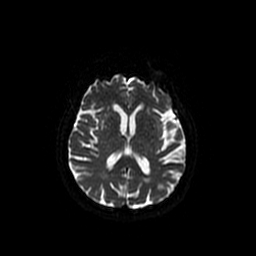
[im 63/94]
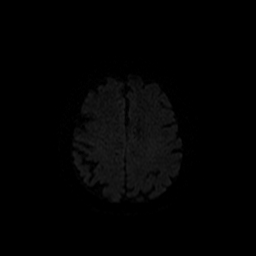
[im 83/94]
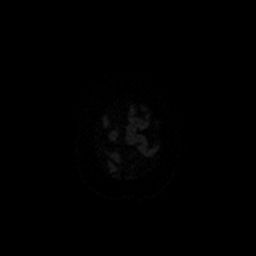
[im 94/94]
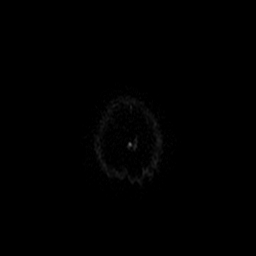

[Series 4: DWI · coronal · 5.0mm · 1.09mm/px · 7 of 66 slices shown (2 of 4)]
[im 1/66]
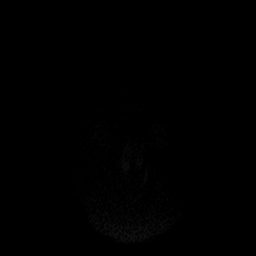
[im 11/66]
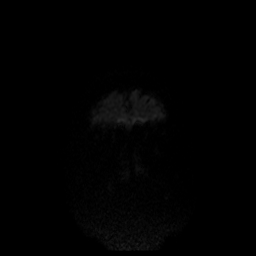
[im 22/66]
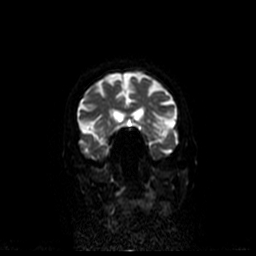
[im 33/66]
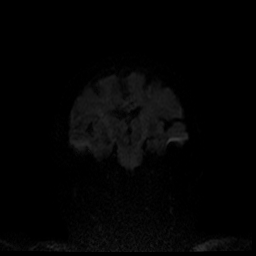
[im 44/66]
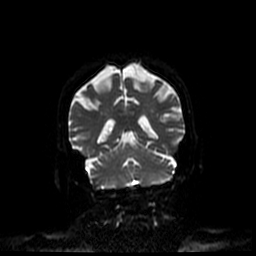
[im 55/66]
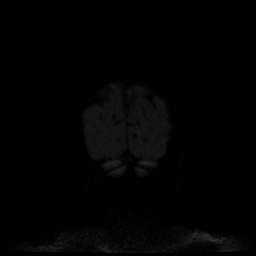
[im 66/66]
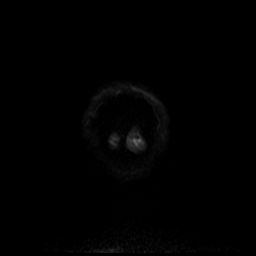

[Series 5: T1 · sagittal · 5.0mm · 0.47mm/px · 3 of 26 slices shown]
[im 1/26]
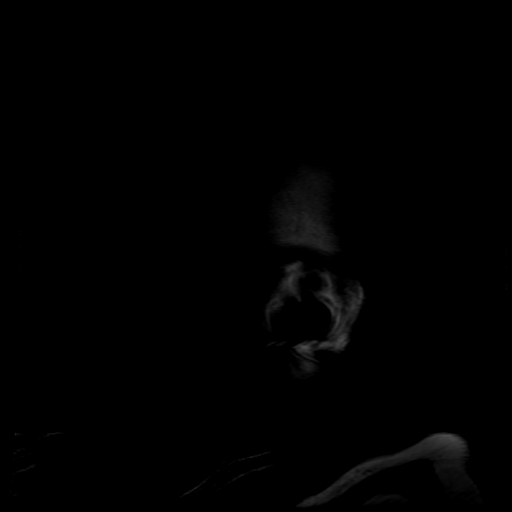
[im 13/26]
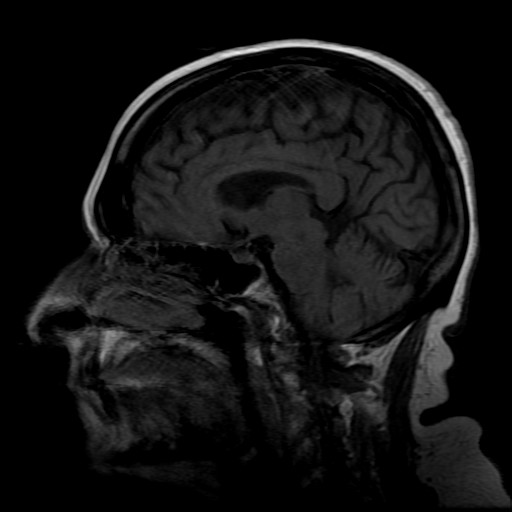
[im 26/26]
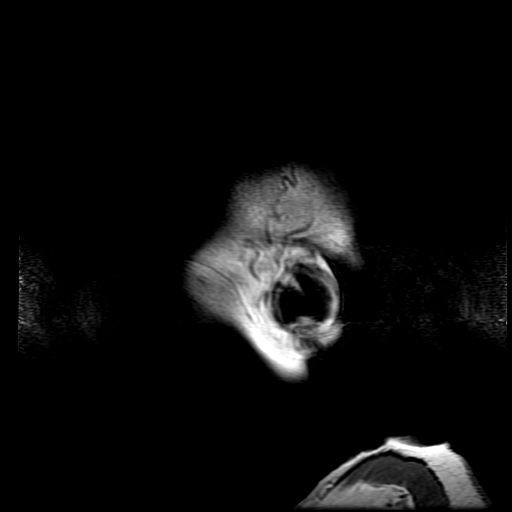

[Series 6: T2 · axial · 5.0mm · 0.43mm/px · z∈[-72,+78]mm · 2 of 26 slices shown (1 of 3)]
[im 1/26]
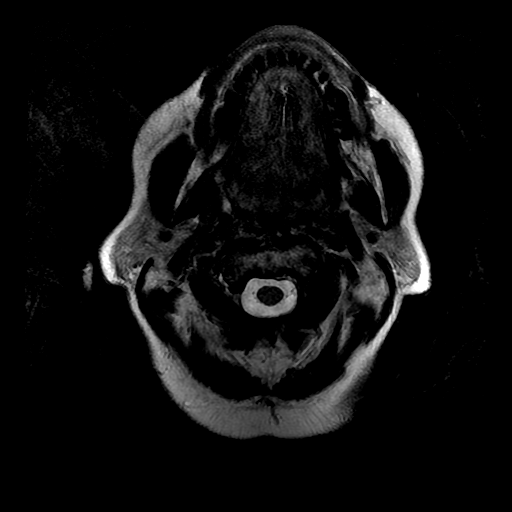
[im 26/26]
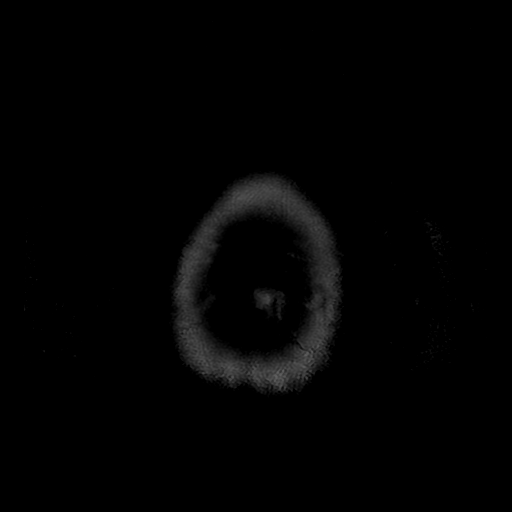

[Series 8: T2 · axial · 5.0mm · 0.43mm/px · z∈[-72,+78]mm · 2 of 26 slices shown (2 of 3)]
[im 1/26]
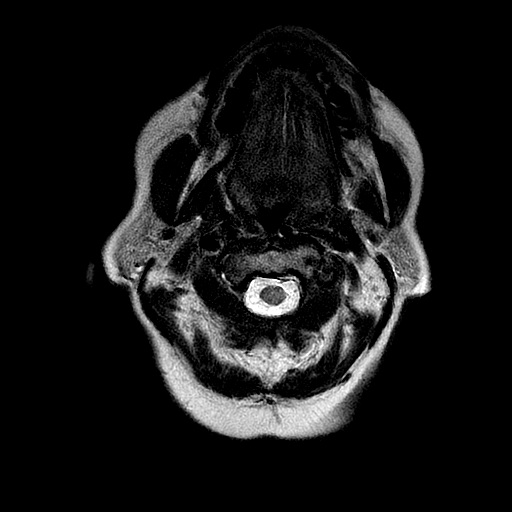
[im 26/26]
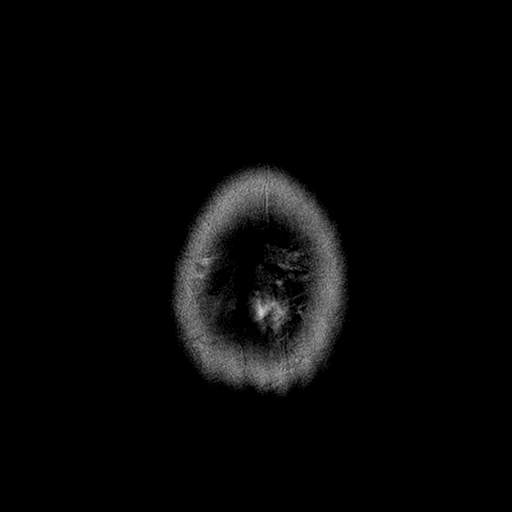

[Series 9: T2 · coronal · 5.0mm · 0.43mm/px · 3 of 29 slices shown (3 of 3)]
[im 1/29]
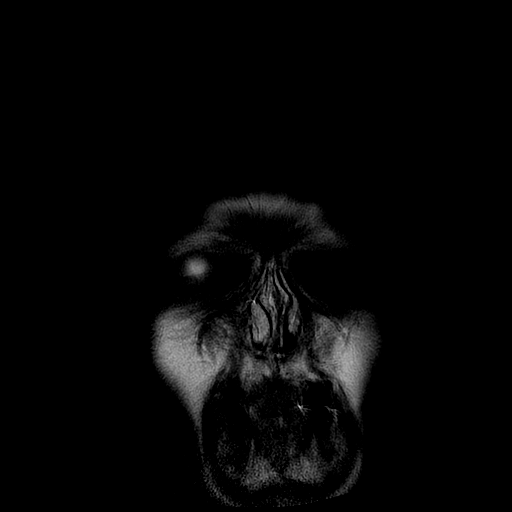
[im 15/29]
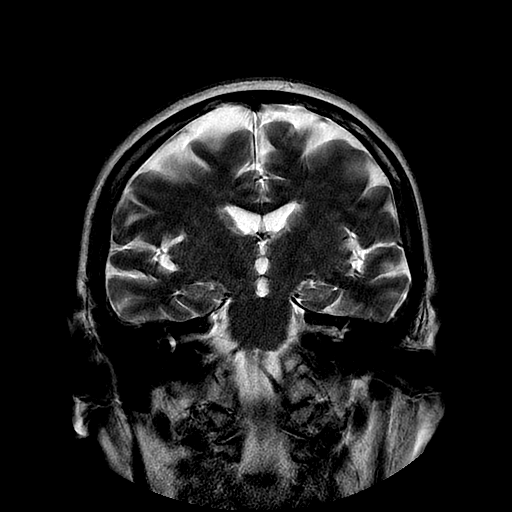
[im 29/29]
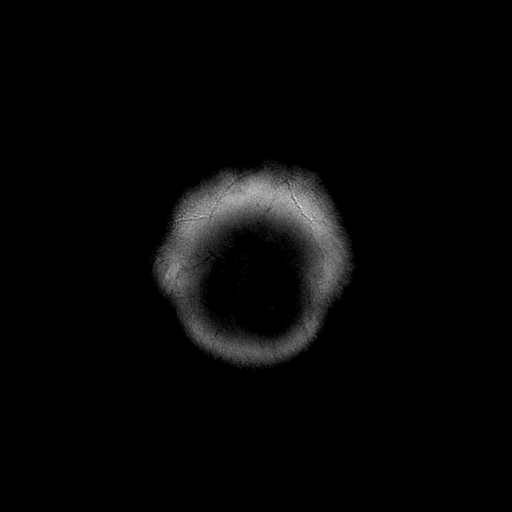

[Series 11: FLAIR · axial · 5.0mm · 0.43mm/px · z∈[-74,+74]mm · 2 of 26 slices shown]
[im 1/26]
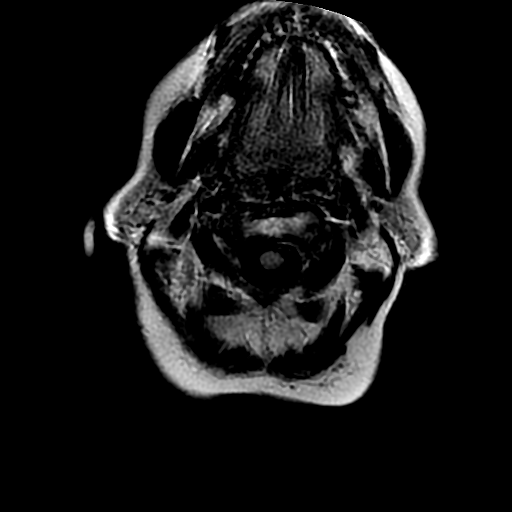
[im 26/26]
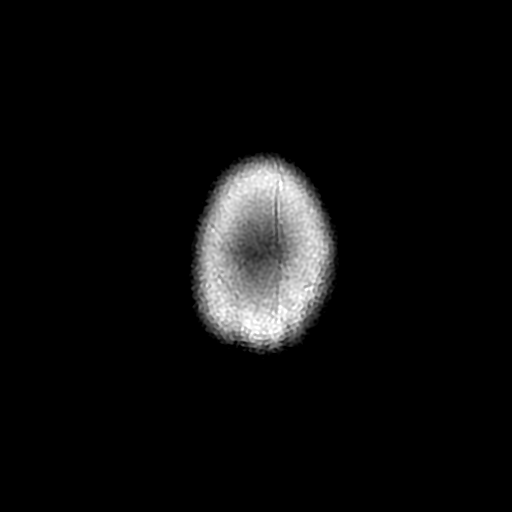

[Series 300: DWI · axial · 3.0mm · 1.09mm/px · z∈[-65,+72]mm · 4 of 47 slices shown (3 of 4)]
[im 1/47]
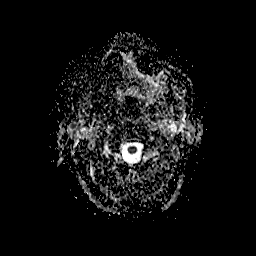
[im 16/47]
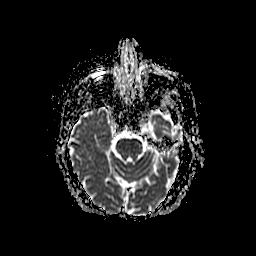
[im 31/47]
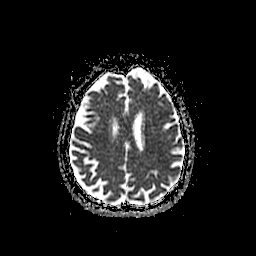
[im 47/47]
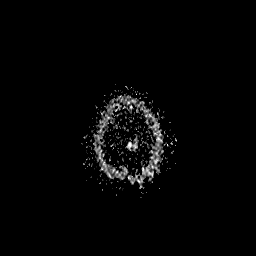

[Series 400: DWI · coronal · 5.0mm · 1.09mm/px · 3 of 33 slices shown (4 of 4)]
[im 1/33]
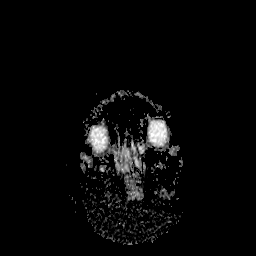
[im 17/33]
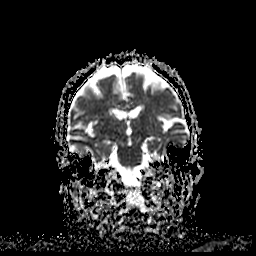
[im 33/33]
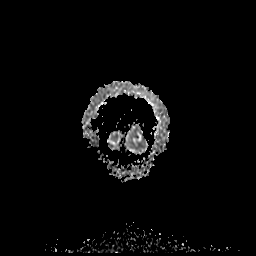

[34 of 48 positions shown; findings below may reference images not displayed]

FINDINGS: Brain: The midline structures are normal. There is no acute infarct
or acute hemorrhage. No mass lesion, hydrocephalus, dural
abnormality or extra-axial collection. There is multifocal white
matter hyperintensity suggesting chronic ischemic microangiopathy.
No age-advanced or lobar predominant atrophy. No chronic
microhemorrhage or superficial siderosis.

Vascular: Major intracranial arterial and venous sinus flow voids
are preserved.

Skull and upper cervical spine: The visualized skull base,
calvarium, upper cervical spine and extracranial soft tissues are
normal.

Sinuses/Orbits: No fluid levels or advanced mucosal thickening. No
mastoid or middle ear effusion. Normal orbits.
IMPRESSION: Sequelae of chronic ischemic microangiopathy without acute
intracranial abnormality. No acute or chronic cerebellar infarct.

## 2019-08-31 ENCOUNTER — Telehealth: Payer: Self-pay | Admitting: *Deleted

## 2019-08-31 NOTE — Telephone Encounter (Signed)
Oral Chemotherapy Pharmacist Encounter   Reached out to patient to further assist her and LVM for her to call back. Patient returned my call to let me know she found her medication. UPS left it at an access point pick-up at the front of a [facility].  Darl Pikes, PharmD, BCPS, [facility] Hematology/Oncology Clinical Pharmacist [facility] Oral [facility] [phone]  08/31/2019 4:10 PM

## 2019-08-31 NOTE — Telephone Encounter (Signed)
Received vm message from patient regarding her Trabecta.  She does not know where it is-which pharmacy has it.  She states she was told that it was Fed Ex'd to a pharmacy on [address] but she states it is not there.  Staff message sent to [facility] about this.

## 2019-09-11 ENCOUNTER — Telehealth: Payer: Self-pay | Admitting: Family Medicine

## 2019-09-11 NOTE — Telephone Encounter (Signed)
New Message   Patient is calling because her son would need to come with her to her appt because she can not walk unassisted.

## 2019-09-11 NOTE — Telephone Encounter (Signed)
Called patient, LVM to call back to discuss 1/29.

## 2019-09-14 ENCOUNTER — Other Ambulatory Visit: Payer: Self-pay

## 2019-09-14 ENCOUNTER — Ambulatory Visit (HOSPITAL_COMMUNITY)
Admission: RE | Admit: 2019-09-14 | Discharge: 2019-09-14 | Disposition: A | Discharge status: Home / Self Care | Payer: Medicare HMO | Source: Ambulatory Visit | Attending: Hematology | Admitting: Hematology

## 2019-09-14 DIAGNOSIS — C3491 Malignant neoplasm of unspecified part of right bronchus or lung: Secondary | ICD-10-CM | POA: Insufficient documentation

## 2019-09-14 MED ORDER — SODIUM CHLORIDE (PF) 0.9 % IJ SOLN
INTRAMUSCULAR | Status: AC
  Filled 2019-09-14: qty 50

## 2019-09-14 MED ORDER — IOHEXOL 300 MG/ML  SOLN
100.0000 mL | Freq: Once | INTRAMUSCULAR | Status: AC | PRN
  Administered 2019-09-14: 65 mL via INTRAVENOUS

## 2019-09-14 NOTE — Progress Notes (Signed)
Cardiology Office Note   Date:  09/15/2019   ID:  Jo Hendrix, DOB [birth year], MRN [id number]  PCP:  Buzzy Han, MD  Cardiologist:  Dr. Claiborne Billings  CC: Generalized fatigued    History of Present Illness: Jo Hendrix is a 86 y.o. female who presents for ongoing assessment and management of hypertension, grade 1 diastolic dysfunction, pulmonary hypertension with complex sleep apnea on BiPAP, mild coronary artery disease per cardiac catheterization in 2008, follow-up nuclear medicine stress test in 2016 which was low risk, echocardiogram revealing EF of60-65% and February 2020.Marland KitchenShe also has had history of palpitations and dizziness and did wear a monitor in 2018 for a week which did not reveal any significant abnormalities she remained in normal sinus rhythm with isolated unifocal PVCs.  She also has a history of tobacco abuse currently in remission. She is unable to tolerate CPAP at night.   Unfortunately,Jo Hendrix was admitted to the hospital in late October with worsening shortness of breath and intermittent chest pain.  CT scan of the chest and chest x-ray revealed bilateral lung masses consistent with primary metastatic spread.  She was seen by oncology and recommended for CT of the abdomen pelvis as well as MRI of the brain with and without contrast.  CT scan of the abdomen and pelvis was negative for metastatic disease.  MRI of the brain was suboptimal was severely motion degraded study but did show punctuate focus of abnormal diffusion restriction within the anterior right parietal lobe and may indicate a small acute area of ischemia versus small mets.  She did have a bronchoscopy on 06/12/2019 per pulmonology which showed findings of preliminary results consistent with non-small cell lung cancer.  The patient is to follow-up with oncology and will be seeing Dr.Zhao at the cancer center at [facility].  Chest pain was felt to be related to lung cancer.  EKG was done with no  ischemic changes, cardiac enzymes were negative, and she was ruled out for ACS.  A 2D echocardiogram with LVEF of 55% to 60% was noted with no wall motion abnormalities.  No further cardiac work-up was recommended.  She comes today with complaints of generalized fatigue and some positional dizziness. She has lost about 15 lbs after beginning chemo therapy due to lack of appetite. She states that she will drink a Boost if she doesn't eat. Breathing at baseline.  Past Medical History:  Diagnosis Date  . ACE-inhibitor cough   . COPD (chronic obstructive pulmonary disease) ([place])   . Diastolic dysfunction   . History of tobacco use    smoked 25 years  . Hypertension   . Mild CAD   . Mild pulmonary hypertension ([place])   . OSA (obstructive sleep apnea)    Sleep Study 04/07/2007 - AHI during total sleep 21.30/hr, during REM 20.16/hr - BiPAP auto servo-ventilation unit    Past Surgical History:  Procedure Laterality Date  . benign lesion removed on lung  2000  . BLADDER REPAIR    . CARDIAC CATHETERIZATION  05/20/2007   mild coronary obstructive disease with 20% narrowing in prox LAD, diffuse luminal irregularity of 40-50% in mid RCA (Dr. Corky Downs)  . Cardiopulmonary Met Test  01/28/2012   with PFTs - FEV1 & FEV1/VC WNL, VC WNL, DLCO WNL; abnormal pulmonary response  . Carotid Doppler  04/2011   normal patency  . CARPAL TUNNEL RELEASE     x2  . CATARACT EXTRACTION, BILATERAL    . ENDOBRONCHIAL ULTRASOUND N/A 06/12/2019  Procedure: ENDOBRONCHIAL ULTRASOUND;  Surgeon: Candee Furbish, MD;  Location: [facility];  Service: Endoscopy;  Laterality: N/A;  . FINE NEEDLE ASPIRATION  06/12/2019   Procedure: FINE NEEDLE ASPIRATION;  Surgeon: Candee Furbish, MD;  Location: [facility];  Service: Endoscopy;;  . GALLBLADDER SURGERY    . NM MYOCAR PERF WALL MOTION  09/2011   lexiscan myoview - normal perfusion, EF 77%, low risk  . Renal Doppler  05/2007   normal renal arteries   . TONSILLECTOMY    .  TRANSTHORACIC ECHOCARDIOGRAM  2013   EF 50-55%; mild MR; mild TR; mild pulm valve regurg; aortic root sclerosis/calcification  . VAGINAL HYSTERECTOMY    . VARICOSE VEIN SURGERY    . VIDEO BRONCHOSCOPY  06/12/2019   Procedure: VIDEO BRONCHOSCOPY;  Surgeon: Candee Furbish, MD;  Location: [facility];  Service: Endoscopy;;     Current Outpatient Medications  Medication Sig Dispense Refill  . amLODipine (NORVASC) 5 MG tablet Take 5 mg by mouth daily.    Marland Kitchen aspirin 81 MG tablet Take 81 mg by mouth daily.    . Calcium 1500 MG tablet Take 1,500 mg by mouth daily.    . capmatinib (TABRECTA) 200 MG tablet Take 2 tablets (400 mg total) by mouth 2 (two) times daily. 112 tablet 11  . cholecalciferol (VITAMIN D) 1000 UNITS tablet Take 1,000 Units by mouth daily. Take 1 tab daily    . NON FORMULARY at bedtime. BiPAP    . Omega-3 Fatty Acids (FISH OIL) 1000 MG CAPS Take 1 capsule by mouth daily.     . ondansetron (ZOFRAN) 8 MG tablet Take 1 tablet (8 mg total) by mouth every 8 (eight) hours as needed for nausea or vomiting. 30 tablet 2  . prochlorperazine (COMPAZINE) 10 MG tablet Take 1 tablet (10 mg total) by mouth every 6 (six) hours as needed for nausea or vomiting. 30 tablet 3  . RESTASIS 0.05 % ophthalmic emulsion Place 1 drop into both eyes 2 (two) times daily. Use 1 drop in each eye twice a day    . rosuvastatin (CRESTOR) 5 MG tablet Take 1 tablet (5 mg total) by mouth every other day. 45 tablet 2   No current facility-administered medications for this visit.    Allergies:   Acetaminophen, Benadryl [diphenhydramine], Zetia [ezetimibe], Gabapentin, Hydrochlorothiazide, Ace inhibitors, Codeine, Erythromycin, Etodolac, and Ivp dye [iodinated diagnostic agents]    Social History:  The patient  reports that she quit smoking about 35 years ago. Her smoking use included cigarettes. She has a 12.50 pack-year smoking history. She has never used smokeless tobacco. She reports that she does not drink  alcohol or use drugs.   Family History:  The patient's family history includes Breast cancer in her daughter; Cancer in her sister; Lung cancer in her mother; Stroke in her sister; Suicidality in her father.    ROS: All other systems are reviewed and negative. Unless otherwise mentioned in H&P    PHYSICAL EXAM: VS:  BP 131/70   Pulse 83   Temp (!) 97.5 F (36.4 C)   Ht '4\' 11"'  (1.499 m)   Wt 142 lb 12.8 oz (64.8 kg)   SpO2 99%   BMI 28.84 kg/m  , BMI Body mass index is 28.84 kg/m. GEN: Well nourished, well developed, in no acute distress HEENT: normal Neck: no JVD, carotid bruits, or masses Cardiac: IRRR; no murmurs, rubs, or gallops,no edema  Respiratory:  Clear to auscultation bilaterally, normal work of breathing GI: soft,  nontender, nondistended, + BS MS: no deformity or atrophy Skin: warm and dry, no rash Neuro:  Strength and sensation are intact Psych: euthymic mood, full affect   EKG:  SR with PAC's and Rare PVC's. Rate of 83 bpm.   Recent Labs: 06/08/2019: B Natriuretic Peptide 24.3 04-Jul-2019: Magnesium 2.0 08/19/2019: ALT 18; BUN 8; Creatinine 1.09; Hemoglobin 13.2; Platelet Count 278; Potassium 3.6; Sodium 139; TSH 0.732    Lipid Panel    Component Value Date/Time   CHOL 214 (H) 05/03/2017 1229   TRIG 97 05/03/2017 1229   HDL 63 05/03/2017 1229   CHOLHDL 3.4 05/03/2017 1229   CHOLHDL 2.9 01/16/2017 0817   VLDL 31 (H) 01/16/2017 0817   LDLCALC 132 (H) 05/03/2017 1229      Wt Readings from Last 3 Encounters:  09/15/19 142 lb 12.8 oz (64.8 kg)  08/19/19 139 lb 9.6 oz (63.3 kg)  07/22/19 139 lb 3.2 oz (63.1 kg)      Other studies Reviewed: Echocardiogram 04-Jul-2019  1. Left ventricular ejection fraction, by visual estimation, is 55 to 60%. The left ventricle has normal function. Normal left ventricular size. Left ventricular septal wall thickness was mildly increased. Normal left ventricular posterior wall  thickness. There is borderline left  ventricular hypertrophy. 2. Left ventricular diastolic Doppler parameters are consistent with impaired relaxation pattern of LV diastolic filling. 3. Global right ventricle has normal systolic function.The right ventricular size is normal. No increase in right ventricular wall thickness. 4. Left atrial size was normal. 5. Right atrial size was normal. 6. Trivial pericardial effusion is present. 7. The mitral valve is normal in structure. Trace mitral valve regurgitation. No evidence of mitral stenosis. 8. The tricuspid valve is normal in structure. Tricuspid valve regurgitation is mild. 9. The aortic valve is normal in structure. Aortic valve regurgitation was not visualized by color flow Doppler. Mild aortic valve sclerosis without stenosis. 10. There is Mild calcification of the aortic valve. 11. There is Mild thickening of the aortic valve. 12. The pulmonic valve was normal in structure. Pulmonic valve regurgitation is mild by color flow Doppler. 13. Mildly elevated pulmonary artery systolic pressure. 14. The inferior vena cava is normal in size with greater than 50% respiratory variability, suggesting right atrial pressure of 3 mmHg.   ASSESSMENT AND PLAN:  1. Hypertension: I have reviewed her home BP readings. She is very soft, sometimes dropping to 105/58. She is symptomatic with this.  I will decrease amlodipine to 5 mg from 10 mg to allow for higher BP and avoid hypotension. She is due to see oncologist tomorrow for labs.   2. Lung Cancer: Now being seen by oncology for po chemo and labs,with Dr. Maylon Peppers.    3. OSA: Continues on BiPAP.  No evidence of desaturation in the office after walking to the room.  Followed by pulmonology.   Current medicines are reviewed at length with the patient today.    Labs/ tests ordered today include: None Phill Myron. West Pugh, ANP, AACC   09/15/2019 5:06 PM    [facility] [place] [address] Office  [phone] Fax [phone]  Notice: This dictation was prepared with Dragon dictation along with smaller phrase technology. Any transcriptional errors that result from this process are unintentional and may not be corrected upon review.

## 2019-09-15 ENCOUNTER — Encounter: Payer: Self-pay | Admitting: Adult Health

## 2019-09-15 ENCOUNTER — Ambulatory Visit: Payer: Medicare HMO | Admitting: Adult Health

## 2019-09-15 VITALS — BP 131/70 | HR 83 | Temp 97.5°F | Ht 59.0 in | Wt 142.8 lb

## 2019-09-15 DIAGNOSIS — I10 Essential (primary) hypertension: Secondary | ICD-10-CM | POA: Diagnosis not present

## 2019-09-15 DIAGNOSIS — R0600 Dyspnea, unspecified: Secondary | ICD-10-CM

## 2019-09-15 DIAGNOSIS — C3490 Malignant neoplasm of unspecified part of unspecified bronchus or lung: Secondary | ICD-10-CM | POA: Diagnosis not present

## 2019-09-15 DIAGNOSIS — G4731 Primary central sleep apnea: Secondary | ICD-10-CM | POA: Diagnosis not present

## 2019-09-15 NOTE — Patient Instructions (Signed)
Medication Instructions:  DECREASE- Amlodipine 5 mg by mouth daily  *If you need a refill on your cardiac medications before your next appointment, please call your pharmacy*  Lab Work: None Ordered  Testing/Procedures: None Ordered  Follow-Up: At [facility], you and your health needs are our priority.  As part of our continuing mission to provide you with exceptional heart care, we have created designated Provider Care Teams.  These Care Teams include your primary Cardiologist (physician) and Advanced Practice Providers (APPs -  Physician Assistants and Nurse Practitioners) who all work together to provide you with the care you need, when you need it.  Your next appointment:   6 month(s)  The format for your next appointment:   In Person  Provider:   Shelva Majestic, MD

## 2019-09-16 ENCOUNTER — Other Ambulatory Visit: Payer: Self-pay

## 2019-09-16 ENCOUNTER — Emergency Department (HOSPITAL_COMMUNITY): Payer: Medicare HMO

## 2019-09-16 ENCOUNTER — Encounter (HOSPITAL_COMMUNITY): Payer: Self-pay | Admitting: Obstetrics and Gynecology

## 2019-09-16 ENCOUNTER — Emergency Department (HOSPITAL_COMMUNITY)
Admission: EM | Admit: 2019-09-16 | Discharge: 2019-09-16 | Disposition: A | Discharge status: Home / Self Care | Payer: Medicare HMO | Attending: Emergency Medicine | Admitting: Emergency Medicine

## 2019-09-16 ENCOUNTER — Inpatient Hospital Stay: Payer: Medicare HMO

## 2019-09-16 ENCOUNTER — Inpatient Hospital Stay: Payer: Medicare HMO | Attending: Hematology | Admitting: Hematology

## 2019-09-16 DIAGNOSIS — I110 Hypertensive heart disease with heart failure: Secondary | ICD-10-CM | POA: Insufficient documentation

## 2019-09-16 DIAGNOSIS — J439 Emphysema, unspecified: Secondary | ICD-10-CM | POA: Insufficient documentation

## 2019-09-16 DIAGNOSIS — Z9221 Personal history of antineoplastic chemotherapy: Secondary | ICD-10-CM | POA: Insufficient documentation

## 2019-09-16 DIAGNOSIS — I509 Heart failure, unspecified: Secondary | ICD-10-CM | POA: Insufficient documentation

## 2019-09-16 DIAGNOSIS — Z20822 Contact with and (suspected) exposure to covid-19: Secondary | ICD-10-CM | POA: Insufficient documentation

## 2019-09-16 DIAGNOSIS — I2510 Atherosclerotic heart disease of native coronary artery without angina pectoris: Secondary | ICD-10-CM | POA: Insufficient documentation

## 2019-09-16 DIAGNOSIS — E860 Dehydration: Secondary | ICD-10-CM | POA: Insufficient documentation

## 2019-09-16 DIAGNOSIS — L568 Other specified acute skin changes due to ultraviolet radiation: Secondary | ICD-10-CM | POA: Insufficient documentation

## 2019-09-16 DIAGNOSIS — R609 Edema, unspecified: Secondary | ICD-10-CM | POA: Insufficient documentation

## 2019-09-16 DIAGNOSIS — J449 Chronic obstructive pulmonary disease, unspecified: Secondary | ICD-10-CM | POA: Insufficient documentation

## 2019-09-16 DIAGNOSIS — Z79899 Other long term (current) drug therapy: Secondary | ICD-10-CM | POA: Insufficient documentation

## 2019-09-16 DIAGNOSIS — Z7982 Long term (current) use of aspirin: Secondary | ICD-10-CM | POA: Diagnosis not present

## 2019-09-16 DIAGNOSIS — I6782 Cerebral ischemia: Secondary | ICD-10-CM | POA: Insufficient documentation

## 2019-09-16 DIAGNOSIS — N281 Cyst of kidney, acquired: Secondary | ICD-10-CM | POA: Insufficient documentation

## 2019-09-16 DIAGNOSIS — R531 Weakness: Secondary | ICD-10-CM

## 2019-09-16 DIAGNOSIS — Z87891 Personal history of nicotine dependence: Secondary | ICD-10-CM | POA: Insufficient documentation

## 2019-09-16 DIAGNOSIS — I700 Atherosclerosis of aorta: Secondary | ICD-10-CM | POA: Insufficient documentation

## 2019-09-16 DIAGNOSIS — C3481 Malignant neoplasm of overlapping sites of right bronchus and lung: Secondary | ICD-10-CM | POA: Insufficient documentation

## 2019-09-16 DIAGNOSIS — I951 Orthostatic hypotension: Secondary | ICD-10-CM | POA: Insufficient documentation

## 2019-09-16 DIAGNOSIS — M7989 Other specified soft tissue disorders: Secondary | ICD-10-CM | POA: Insufficient documentation

## 2019-09-16 LAB — CBC WITH DIFFERENTIAL/PLATELET
Abs Immature Granulocytes: 0.06 10*3/uL (ref 0.00–0.07)
Basophils Absolute: 0 10*3/uL (ref 0.0–0.1)
Basophils Relative: 0 %
Eosinophils Absolute: 0.1 10*3/uL (ref 0.0–0.5)
Eosinophils Relative: 1 %
HCT: 45.7 % (ref 36.0–46.0)
Hemoglobin: 15.3 g/dL — ABNORMAL HIGH (ref 12.0–15.0)
Immature Granulocytes: 1 %
Lymphocytes Relative: 17 %
Lymphs Abs: 1.6 10*3/uL (ref 0.7–4.0)
MCH: 30.2 pg (ref 26.0–34.0)
MCHC: 33.5 g/dL (ref 30.0–36.0)
MCV: 90.1 fL (ref 80.0–100.0)
Monocytes Absolute: 0.6 10*3/uL (ref 0.1–1.0)
Monocytes Relative: 6 %
Neutro Abs: 7 10*3/uL (ref 1.7–7.7)
Neutrophils Relative %: 75 %
Platelets: 340 10*3/uL (ref 150–400)
RBC: 5.07 MIL/uL (ref 3.87–5.11)
RDW: 15 % (ref 11.5–15.5)
WBC: 9.4 10*3/uL (ref 4.0–10.5)
nRBC: 0 % (ref 0.0–0.2)

## 2019-09-16 LAB — COMPREHENSIVE METABOLIC PANEL
ALT: 20 U/L (ref 0–44)
AST: 18 U/L (ref 15–41)
Albumin: 3.3 g/dL — ABNORMAL LOW (ref 3.5–5.0)
Alkaline Phosphatase: 75 U/L (ref 38–126)
Anion gap: 10 (ref 5–15)
BUN: 16 mg/dL (ref 8–23)
CO2: 23 mmol/L (ref 22–32)
Calcium: 8.7 mg/dL — ABNORMAL LOW (ref 8.9–10.3)
Chloride: 105 mmol/L (ref 98–111)
Creatinine, Ser: 1.28 mg/dL — ABNORMAL HIGH (ref 0.44–1.00)
GFR calc Af Amer: 43 mL/min — ABNORMAL LOW (ref >60)
GFR calc non Af Amer: 37 mL/min — ABNORMAL LOW (ref >60)
Glucose, Bld: 114 mg/dL — ABNORMAL HIGH (ref 70–99)
Potassium: 3.7 mmol/L (ref 3.5–5.1)
Sodium: 138 mmol/L (ref 135–145)
Total Bilirubin: 0.9 mg/dL (ref 0.3–1.2)
Total Protein: 7 g/dL (ref 6.5–8.1)

## 2019-09-16 LAB — TSH: TSH: 1.825 u[IU]/mL (ref 0.350–4.500)

## 2019-09-16 LAB — LACTIC ACID, PLASMA
Lactic Acid, Venous: 2.3 mmol/L (ref 0.5–1.9)
Lactic Acid, Venous: 1.9 mmol/L (ref 0.5–1.9)

## 2019-09-16 LAB — URINALYSIS, ROUTINE W REFLEX MICROSCOPIC
Bacteria, UA: NONE SEEN
Bilirubin Urine: NEGATIVE
Glucose, UA: NEGATIVE mg/dL
Hgb urine dipstick: NEGATIVE
Ketones, ur: NEGATIVE mg/dL
Nitrite: NEGATIVE
Protein, ur: NEGATIVE mg/dL
Specific Gravity, Urine: 1.006 (ref 1.005–1.030)
pH: 7 (ref 5.0–8.0)

## 2019-09-16 LAB — TROPONIN I (HIGH SENSITIVITY)
Troponin I (High Sensitivity): 4 ng/L (ref <18)
Troponin I (High Sensitivity): 7 ng/L (ref <18)

## 2019-09-16 LAB — APTT: aPTT: 26 seconds (ref 24–36)

## 2019-09-16 LAB — MAGNESIUM: Magnesium: 1.8 mg/dL (ref 1.7–2.4)

## 2019-09-16 LAB — BRAIN NATRIURETIC PEPTIDE: B Natriuretic Peptide: 50.5 pg/mL (ref 0.0–100.0)

## 2019-09-16 LAB — RESPIRATORY PANEL BY RT PCR (FLU A&B, COVID)
Influenza A by PCR: NEGATIVE
Influenza B by PCR: NEGATIVE
SARS Coronavirus 2 by RT PCR: NEGATIVE

## 2019-09-16 LAB — PROTIME-INR
INR: 1 (ref 0.8–1.2)
Prothrombin Time: 12.6 seconds (ref 11.4–15.2)

## 2019-09-16 MED ORDER — SODIUM CHLORIDE 0.9 % IV BOLUS
1000.0000 mL | Freq: Once | INTRAVENOUS | Status: AC
  Administered 2019-09-16: 1000 mL via INTRAVENOUS

## 2019-09-16 MED ORDER — SODIUM CHLORIDE 0.9 % IV BOLUS
500.0000 mL | Freq: Once | INTRAVENOUS | Status: AC
  Administered 2019-09-16: 500 mL via INTRAVENOUS

## 2019-09-16 NOTE — ED Notes (Signed)
Pt has been placed on pure wick. Suction set to 53mmHg.

## 2019-09-16 NOTE — ED Provider Notes (Signed)
[facility] DEPT Provider Note   CSN: [id number] Arrival date & time: 09/16/19  1048     History Chief Complaint  Patient presents with  . Cancer  . Weakness    Jo Hendrix is a 86 y.o. female with history of COPD, CHF, hypertension, CAD, OSA, hyperlipidemia, lung cancer, currently on Tabrecta p.o. chemotherapy.  Patient presents today for generalized weakness and shakiness beginning yesterday morning 09/15/2019.  She reports that this has continued to worsen since onset.  Additionally she reports a lightheaded/dizzy sensation with positional changes.  She reports that she feels like she simply does not have as much strength as she should and is having difficulty with daily tasks.  She denies any fall/injury, headache, vision changes, confusion, difficulty speaking, neck pain, sore throat, cough, chest pain/shortness of breath, nausea/vomiting, abdominal pain, dysuria/hematuria, diarrhea, numbness/tingling or unilateral weakness, EtOH use or any additional concerns.  HPI     Past Medical History:  Diagnosis Date  . ACE-inhibitor cough   . COPD (chronic obstructive pulmonary disease) ([place])   . Diastolic dysfunction   . History of tobacco use    smoked 25 years  . Hypertension   . Mild CAD   . Mild pulmonary hypertension ([place])   . OSA (obstructive sleep apnea)    Sleep Study 04/07/2007 - AHI during total sleep 21.30/hr, during REM 20.16/hr - BiPAP auto servo-ventilation unit    Patient Active Problem List   Diagnosis Date Noted  . Thyroid nodule   . Adenocarcinoma of right lung ([place]) 06/09/2019  . SOB (shortness of breath) 06/09/2019  . Palpitations 06/08/2019  . Dizziness 05/19/2018  . Mild CAD 12/03/2013  . 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 Hyperlipidemia 12/03/2013  . Essential hypertension 12/03/2013  . Dyspnea 03/25/2012  . Pulmonary emphysema ([place]) 03/25/2012    Past Surgical History:  Procedure Laterality Date  . benign  lesion removed on lung  2000  . BLADDER REPAIR    . CARDIAC CATHETERIZATION  05/20/2007   mild coronary obstructive disease with 20% narrowing in prox LAD, diffuse luminal irregularity of 40-50% in mid RCA (Dr. Corky Downs)  . Cardiopulmonary Met Test  01/28/2012   with PFTs - FEV1 & FEV1/VC WNL, VC WNL, DLCO WNL; abnormal pulmonary response  . Carotid Doppler  04/2011   normal patency  . CARPAL TUNNEL RELEASE     x2  . CATARACT EXTRACTION, BILATERAL    . ENDOBRONCHIAL ULTRASOUND N/A 06/12/2019   Procedure: ENDOBRONCHIAL ULTRASOUND;  Surgeon: Candee Furbish, MD;  Location: [facility];  Service: Endoscopy;  Laterality: N/A;  . FINE NEEDLE ASPIRATION  06/12/2019   Procedure: FINE NEEDLE ASPIRATION;  Surgeon: Candee Furbish, MD;  Location: [facility];  Service: Endoscopy;;  . GALLBLADDER SURGERY    . NM MYOCAR PERF WALL MOTION  09/2011   lexiscan myoview - normal perfusion, EF 77%, low risk  . Renal Doppler  05/2007   normal renal arteries   . TONSILLECTOMY    . TRANSTHORACIC ECHOCARDIOGRAM  2013   EF 50-55%; mild MR; mild TR; mild pulm valve regurg; aortic root sclerosis/calcification  . VAGINAL HYSTERECTOMY    . VARICOSE VEIN SURGERY    . VIDEO BRONCHOSCOPY  06/12/2019   Procedure: VIDEO BRONCHOSCOPY;  Surgeon: Candee Furbish, MD;  Location: [facility];  Service: Endoscopy;;     OB History   No obstetric history on file.     Family History  Problem Relation Age of Onset  . Lung cancer  Mother   . Suicidality Father   . Cancer Sister   . Breast cancer Daughter   . Stroke Sister     Social History   Tobacco Use  . Smoking status: Former Smoker    Packs/day: 0.50    Years: 25.00    Pack years: 12.50    Types: Cigarettes    Quit date: 08/13/1984    Years since quitting: 35.1  . Smokeless tobacco: Never Used  Substance Use Topics  . Alcohol use: No  . Drug use: No    Home Medications Prior to Admission medications   Medication Sig Start Date End Date Taking?  Authorizing Provider  acetaminophen (TYLENOL) 325 MG tablet Take 650 mg by mouth every 6 (six) hours as needed for mild pain or headache.   Yes [provider]  amLODipine (NORVASC) 5 MG tablet Take 5 mg by mouth daily.   Yes [provider]  aspirin 81 MG tablet Take 81 mg by mouth daily.   Yes [provider]  Calcium 1500 MG tablet Take 1,500 mg by mouth daily.   Yes [provider]  capmatinib (TABRECTA) 200 MG tablet Take 2 tablets (400 mg total) by mouth 2 (two) times daily. 08/24/19  Yes Tish Men, MD  cholecalciferol (VITAMIN D) 1000 UNITS tablet Take 1,000 Units by mouth daily. Take 1 tab daily 12/21/14  Yes [provider]  Omega-3 Fatty Acids (FISH OIL) 1000 MG CAPS Take 1,000 mg by mouth daily.    Yes [provider]  ondansetron (ZOFRAN) 8 MG tablet Take 1 tablet (8 mg total) by mouth every 8 (eight) hours as needed for nausea or vomiting. 08/26/19  Yes Tish Men, MD  polyvinyl alcohol (LIQUIFILM TEARS) 1.4 % ophthalmic solution Place 1 drop into both eyes as needed for dry eyes.   Yes [provider]  prochlorperazine (COMPAZINE) 10 MG tablet Take 1 tablet (10 mg total) by mouth every 6 (six) hours as needed for nausea or vomiting. 08/26/19  Yes Tish Men, MD  RESTASIS 0.05 % ophthalmic emulsion Place 1 drop into both eyes 2 (two) times daily. Use 1 drop in each eye twice a day 03/15/15  Yes [provider]  rosuvastatin (CRESTOR) 5 MG tablet Take 1 tablet (5 mg total) by mouth every other day. 08/24/19  Yes Troy Sine, MD  NON FORMULARY at bedtime. BiPAP    [provider]    Allergies    Acetaminophen, Benadryl [diphenhydramine], Zetia [ezetimibe], Gabapentin, Hydrochlorothiazide, Ace inhibitors, Codeine, Erythromycin, Etodolac, and Ivp dye [iodinated diagnostic agents]  Review of Systems   Review of Systems Ten systems are reviewed and are negative for acute change except as noted in the  HPI  Physical Exam Updated Vital Signs BP (!) 119/52   Pulse 87   Temp 99.1 F (37.3 C) (Rectal)   Resp 15   Ht 4' 11.02" (1.499 m)   Wt 64.8 kg   SpO2 97%   BMI 28.83 kg/m   Physical Exam Constitutional:      General: She is not in acute distress.    Appearance: Normal appearance. She is well-developed. She is not ill-appearing or diaphoretic.  HENT:     Head: Normocephalic and atraumatic.     Right Ear: External ear normal.     Left Ear: External ear normal.     Nose: Nose normal.  Eyes:     General: Vision grossly intact. Gaze aligned appropriately.     Pupils: Pupils are equal,  round, and reactive to light.  Neck:     Trachea: Trachea and phonation normal. No tracheal deviation.  Cardiovascular:     Rate and Rhythm: Normal rate and regular rhythm.     Pulses: Normal pulses.  Pulmonary:     Effort: Pulmonary effort is normal. No respiratory distress.     Breath sounds: Normal breath sounds.  Abdominal:     General: There is no distension.     Palpations: Abdomen is soft.     Tenderness: There is no abdominal tenderness. There is no guarding or rebound.  Musculoskeletal:        General: Normal range of motion.     Cervical back: Normal range of motion.  Skin:    General: Skin is warm and dry.  Neurological:     Mental Status: She is alert.     GCS: GCS eye subscore is 4. GCS verbal subscore is 5. GCS motor subscore is 6.     Comments: Speech is clear and goal oriented, follows commands Major Cranial nerves without deficit, no facial droop 4/5 strength in upper and lower extremities bilaterally including dorsiflexion and plantar flexion, equal grip strength Sensation normal to light and sharp touch Moves extremities without ataxia, coordination intact Normal finger to nose and rapid alternating movements Neg romberg, no pronator drift Slow gait Mild bilateral tremor to the upper extremities  Psychiatric:        Behavior: Behavior normal.     ED Results /  Procedures / Treatments   Labs (all labs ordered are listed, but only abnormal results are displayed) Labs Reviewed  LACTIC ACID, PLASMA - Abnormal; Notable for the following components:      Result Value   Lactic Acid, Venous 2.3 (*)    All other components within normal limits  COMPREHENSIVE METABOLIC PANEL - Abnormal; Notable for the following components:   Glucose, Bld 114 (*)    Creatinine, Ser 1.28 (*)    Calcium 8.7 (*)    Albumin 3.3 (*)    GFR calc non Af Amer 37 (*)    GFR calc Af Amer 43 (*)    All other components within normal limits  CBC WITH DIFFERENTIAL/PLATELET - Abnormal; Notable for the following components:   Hemoglobin 15.3 (*)    All other components within normal limits  RESPIRATORY PANEL BY RT PCR (FLU A&B, COVID)  CULTURE, BLOOD (ROUTINE X 2)  CULTURE, BLOOD (ROUTINE X 2)  URINE CULTURE  LACTIC ACID, PLASMA  APTT  PROTIME-INR  MAGNESIUM  TSH  BRAIN NATRIURETIC PEPTIDE  URINALYSIS, ROUTINE W REFLEX MICROSCOPIC  TROPONIN I (HIGH SENSITIVITY)  TROPONIN I (HIGH SENSITIVITY)    EKG EKG Interpretation  Date/Time:  Wednesday September 16 2019 14:33:48 EST Ventricular Rate:  82 PR Interval:    QRS Duration: 53 QT Interval:  385 QTC Calculation: 450 R Axis:   61 Text Interpretation: Sinus rhythm Low voltage, extremity and precordial leads Anteroseptal infarct, age indeterminate Junctional ST depression When compared to earlier, QTC improved. No STEMI Confirmed by Antony Blackbird [phone]) on 09/16/2019 2:52:24 PM   Radiology CT Head Wo Contrast  Result Date: 09/16/2019 CLINICAL DATA:  Weakness, right-sided non-small cell lung cancer, dizziness EXAM: CT HEAD WITHOUT CONTRAST TECHNIQUE: Contiguous axial images were obtained from the base of the skull through the vertex without intravenous contrast. COMPARISON:  07/26/2017. 06/26/2019 FINDINGS: Brain: No acute infarct or hemorrhage. Chronic small vessel ischemic changes are seen within the right external  capsule and right frontal periventricular white  matter. Lateral ventricles and remaining midline structures are unremarkable. No acute extra-axial fluid collections. No mass effect. Vascular: No hyperdense vessel or unexpected calcification. Skull: Normal. Negative for fracture or focal lesion. Sinuses/Orbits: No acute finding. Other: None IMPRESSION: 1. Stable head CT, no acute intracranial process. Electronically Signed   By: Randa Ngo M.D.   On: 09/16/2019 13:38   DG Chest Port 1 View  Result Date: 09/16/2019 CLINICAL DATA:  Weakness. Lung cancer. EXAM: PORTABLE CHEST 1 VIEW COMPARISON:  Chest x-ray dated 06/08/2019 and CT scan of the chest dated 09/14/2019 and PET-CT dated 06/26/2019 FINDINGS: There are areas of parenchymal scarring at the right lung apex as well as in the superior segment of the right lower lobe as demonstrated on the recent CT scan of the chest. The areas of tumor in the right upper lobe and in the right hilum and right lower lobe superior segment have markedly improved. The left lung remains clear. Heart size and vascularity are normal. No effusions. No acute bone abnormality. IMPRESSION: Marked improvement in the areas of tumor in the right upper lobe and right hilum and right lower lobe as compared to the prior chest x-ray of 06/08/2019. No new abnormalities. Electronically Signed   By: Lorriane Shire M.D.   On: 09/16/2019 12:16    Procedures Procedures (including critical care time)  Medications Ordered in ED Medications  sodium chloride 0.9 % bolus 500 mL (has no administration in time range)  sodium chloride 0.9 % bolus 1,000 mL (1,000 mLs Intravenous Bolus from Bag 09/16/19 1136)    ED Course  I have reviewed the triage vital signs and the nursing notes.  Pertinent labs & imaging results that were available during my care of the patient were reviewed by me and considered in my medical decision making (see chart for details).  Clinical Course as of Sep 15 1526    Wed Sep 16, 2019  1444 Dr. Maylon Peppers   [BM]    Clinical Course User Index [BM] Deliah Boston, PA-C    ------------ Patient's chart was reviewed, she had CT chest, abdomen, pelvis performed on 09/14/2019 through her oncologist office.  CT AP: IMPRESSION: Mass-like cavitary lesion in the right upper and lower lobes, corresponding to the patient's known primary bronchogenic neoplasm, improved. Decreased soft tissue component.  Improving right suprahilar nodal metastasis.  No evidence of metastatic disease in the abdomen/pelvis.  Aortic Atherosclerosis (ICD10-I70.0) and Emphysema (ICD10-J43.9).  CT Chest: IMPRESSION: Mass-like cavitary lesion in the right upper and lower lobes, corresponding to the patient's known primary bronchogenic neoplasm, improved. Decreased soft tissue component.  Improving right suprahilar nodal metastasis.  No evidence of metastatic disease in the abdomen/pelvis.  Aortic Atherosclerosis (ICD10-I70.0) and Emphysema (ICD10-J43.9). ---------------  MDM Rules/Calculators/A&P                     86 year old female with history of lung cancer currently taking p.o. chemotherapy presents today for generalized weakness shakiness and dizziness/lightheadedness beginning yesterday morning.  No infectious-like symptoms, no pain, unilateral weakness, injuries or numbness.  On exam she is frail appearing, nontoxic and in no acute distress.  Cranial nerves are intact, no meningeal signs, equal strength to the bilateral upper and lower extremities however does appear generally weak.  Abdomen soft nontender without peritoneal signs.  Vital signs are unremarkable, no fever or tachycardia.  Patient does not meet SIRS/sepsis criteria at this time.  Will obtain broad work-up with labs in addition to blood cultures, troponin, magnesium, TSH, lactic,  Covid.  Additionally will obtain chest x-ray, CT head, EKG.  Will give fluid bolus and monitor.  Patient was seen and evaluated  by Dr. Sherry Ruffing. - Lab work and imaging reviewed.  Significant for a mild elevated lactic at 2.3 which cleared following a 1 L fluid bolus.  Mild AKI with creatinine 1.28.  Additionally initial EKG with prolonged QT of 606, this also improved to 450 on repeat after fluid bolus. - 2:40 PM: On reassessment patient is resting comfortably and in no acute distress reports that she is feeling somewhat improved following fluid bolus today.  She states understanding of work-up and has no questions at this time. - 2:44 PM: Discussed case with patient's oncologist Dr. Maylon Peppers who recommends that if patient is feeling improved there is no further testing is indicated from their standpoint, advises patient to increase p.o. intake and she may continue taking her chemotherapy as prescribed and follow-up in his office next week. - Additional 500 mL fluid bolus has been ordered for patient suspected dehydration, additionally orthostatic vital signs have been ordered.  Urinalysis is still pending.  Care handoff given to Janetta Hora PA-C at shift change.  Plan of care at this time is to follow-up on urinalysis, orthostatic vital signs.  If reassuring anticipate discharge to home with with outpatient oncology follow-up.  Discussed with Dr. Sherry Ruffing.  Additionally patient reports that she lives with her son who is abdominal with daily activities.  Jo Hendrix was evaluated in Emergency Department on 09/16/2019 for the symptoms described in the history of present illness. She was evaluated in the context of the global COVID-19 pandemic, which necessitated consideration that the patient might be at risk for infection with the SARS-CoV-2 virus that causes COVID-19. Institutional protocols and algorithms that pertain to the evaluation of patients at risk for COVID-19 are in a state of rapid change based on information released by regulatory bodies including the CDC and federal and state organizations. These policies and algorithms  were followed during the patient's care in the ED.  Note: Portions of this report may have been transcribed using voice recognition software. Every effort was made to ensure accuracy; however, inadvertent computerized transcription errors may still be present. Final Clinical Impression(s) / ED Diagnoses Final diagnoses:  Dehydration  Generalized weakness    Rx / DC Orders ED Discharge Orders    None       Gari Crown 09/16/19 1528    Tegeler, Gwenyth Allegra, MD 09/17/19 [phone]

## 2019-09-16 NOTE — Discharge Instructions (Addendum)
You have been diagnosed today with dehydration, generalized weakness.  At this time there does not appear to be the presence of an emergent medical condition, however there is always the potential for conditions to change. Please read and follow the below instructions.  Please return to the Emergency Department immediately for any new or worsening symptoms. Please be sure to follow up with your Primary Care Provider within one week regarding your visit today; please call their office to schedule an appointment even if you are feeling better for a follow-up visit. Please drink plenty of water and get plenty of rest.  Be sure to eat enough food.  Follow-up with your oncologist next week for reevaluation.  Dr. Maylon Peppers advised that you may continue take your chemotherapy as prescribed.  Get help right away if you have: Any symptoms of very bad dehydration. Symptoms of vomiting, such as: You cannot eat or drink without vomiting. Your vomiting gets worse or does not go away. Your vomit has blood or green stuff in it. Symptoms that get worse with treatment. A fever. A very bad headache. Problems with peeing or pooping (having a bowel movement), such as: Watery poop that gets worse or does not go away. Blood in your poop (stool). This may cause poop to look black and tarry. Not peeing in 6-8 hours. Peeing only a small amount of very dark pee in 6-8 hours. Trouble breathing. Have sudden weakness on one side of your face or body. Have chest pain. Have trouble breathing or shortness of breath. Have problems with your vision. Have trouble talking or swallowing. Have trouble standing or walking. Are light-headed. Pass out (lose consciousness). You have any new/concerning or worsening of symptoms  Please read the additional information packets attached to your discharge summary.  Do not take your medicine if  develop an itchy rash, swelling in your mouth or lips, or difficulty breathing; call 911  and seek immediate emergency medical attention if this occurs.  Note: Portions of this text may have been transcribed using voice recognition software. Every effort was made to ensure accuracy; however, inadvertent computerized transcription errors may still be present.

## 2019-09-16 NOTE — ED Notes (Signed)
Pt asked for urine sample. Pt attempting to void at this time and requesting privacy.

## 2019-09-16 NOTE — ED Triage Notes (Signed)
Patient presents to the ED for complaint of weakness and shakiness. Patient reports she is a cancer patient and has lung cancer. Patient reports she is doing oral chemo and started in December.  Patient denies COVID exposure

## 2019-09-16 NOTE — ED Provider Notes (Signed)
86 year old female with mild dehydration and weakness/shakiness since yesterday. Found to have mild AKI. Give 1.5L bolus. Orthostatics were obtained and she did not become hypotensive but did have difficulty ambulating due to dizziness.    Orthostatic VS for the past 24 hrs (Last 3 readings):  BP- Lying Pulse- Lying BP- Sitting Pulse- Sitting BP- Standing at 0 minutes Pulse- Standing at 0 minutes  09/16/19 1650 132/88 80 115/60 84 108/63 105    Admission discussed - pt is preferring to go home at this time. Results were discussed with her son as well who is at home with her all the time. Pt's oncologist was contacted and advised f/u in the office next week. Will d/c with strict return precautions.   Recardo Evangelist, PA-C 09/16/19 1731    Veryl Speak, MD 09/16/19 [phone]

## 2019-09-16 NOTE — ED Notes (Signed)
Date and time results received: 09/16/19 12:24 PM   Test: Lactic Acid Critical Value: 2.3  Name of Provider Notified: Erlene Quan PA  Orders Received? Or Actions Taken?:none at this time

## 2019-09-17 LAB — URINE CULTURE

## 2019-09-18 ENCOUNTER — Telehealth: Payer: Self-pay | Admitting: Hematology

## 2019-09-18 NOTE — Telephone Encounter (Signed)
Rescheduled per 2/5 sch msg, pt req. Called and spoke with pt, confirmed 2/17 appt

## 2019-09-18 NOTE — Addendum Note (Signed)
Addended by: Vennie Homans on: 09/18/2019 12:31 PM   Modules accepted: Orders

## 2019-09-25 LAB — CULTURE, BLOOD (ROUTINE X 2)
Culture: NO GROWTH
Culture: NO GROWTH
Special Requests: ADEQUATE

## 2019-09-27 ENCOUNTER — Ambulatory Visit: Payer: Medicare HMO

## 2019-09-30 ENCOUNTER — Inpatient Hospital Stay: Payer: Medicare HMO | Admitting: Hematology

## 2019-09-30 ENCOUNTER — Other Ambulatory Visit: Payer: Self-pay

## 2019-09-30 ENCOUNTER — Encounter: Payer: Self-pay | Admitting: Hematology

## 2019-09-30 ENCOUNTER — Inpatient Hospital Stay: Payer: Medicare HMO

## 2019-09-30 VITALS — BP 147/87 | HR 83 | Temp 98.3°F | Resp 20 | Ht 59.0 in | Wt 146.5 lb

## 2019-09-30 DIAGNOSIS — C3481 Malignant neoplasm of overlapping sites of right bronchus and lung: Secondary | ICD-10-CM | POA: Diagnosis not present

## 2019-09-30 DIAGNOSIS — I700 Atherosclerosis of aorta: Secondary | ICD-10-CM | POA: Diagnosis not present

## 2019-09-30 DIAGNOSIS — L568 Other specified acute skin changes due to ultraviolet radiation: Secondary | ICD-10-CM | POA: Diagnosis not present

## 2019-09-30 DIAGNOSIS — N281 Cyst of kidney, acquired: Secondary | ICD-10-CM | POA: Diagnosis not present

## 2019-09-30 DIAGNOSIS — C3491 Malignant neoplasm of unspecified part of right bronchus or lung: Secondary | ICD-10-CM | POA: Diagnosis not present

## 2019-09-30 DIAGNOSIS — I6782 Cerebral ischemia: Secondary | ICD-10-CM | POA: Diagnosis not present

## 2019-09-30 DIAGNOSIS — Z9221 Personal history of antineoplastic chemotherapy: Secondary | ICD-10-CM | POA: Diagnosis not present

## 2019-09-30 DIAGNOSIS — Z79899 Other long term (current) drug therapy: Secondary | ICD-10-CM | POA: Diagnosis not present

## 2019-09-30 DIAGNOSIS — M7989 Other specified soft tissue disorders: Secondary | ICD-10-CM | POA: Diagnosis not present

## 2019-09-30 DIAGNOSIS — R609 Edema, unspecified: Secondary | ICD-10-CM | POA: Diagnosis not present

## 2019-09-30 DIAGNOSIS — J439 Emphysema, unspecified: Secondary | ICD-10-CM | POA: Diagnosis not present

## 2019-09-30 DIAGNOSIS — I951 Orthostatic hypotension: Secondary | ICD-10-CM | POA: Diagnosis not present

## 2019-09-30 DIAGNOSIS — E860 Dehydration: Secondary | ICD-10-CM | POA: Diagnosis not present

## 2019-09-30 DIAGNOSIS — Z7982 Long term (current) use of aspirin: Secondary | ICD-10-CM | POA: Diagnosis not present

## 2019-09-30 DIAGNOSIS — I2510 Atherosclerotic heart disease of native coronary artery without angina pectoris: Secondary | ICD-10-CM | POA: Diagnosis not present

## 2019-09-30 LAB — CMP (CANCER CENTER ONLY)
ALT: 19 U/L (ref 0–44)
AST: 16 U/L (ref 15–41)
Albumin: 2.7 g/dL — ABNORMAL LOW (ref 3.5–5.0)
Alkaline Phosphatase: 85 U/L (ref 38–126)
Anion gap: 7 (ref 5–15)
BUN: 11 mg/dL (ref 8–23)
CO2: 26 mmol/L (ref 22–32)
Calcium: 7.8 mg/dL — ABNORMAL LOW (ref 8.9–10.3)
Chloride: 107 mmol/L (ref 98–111)
Creatinine: 1.21 mg/dL — ABNORMAL HIGH (ref 0.44–1.00)
GFR, Est AFR Am: 46 mL/min — ABNORMAL LOW (ref >60)
GFR, Estimated: 39 mL/min — ABNORMAL LOW (ref >60)
Glucose, Bld: 112 mg/dL — ABNORMAL HIGH (ref 70–99)
Potassium: 3.6 mmol/L (ref 3.5–5.1)
Sodium: 140 mmol/L (ref 135–145)
Total Bilirubin: 0.3 mg/dL (ref 0.3–1.2)
Total Protein: 5.8 g/dL — ABNORMAL LOW (ref 6.5–8.1)

## 2019-09-30 LAB — CBC WITH DIFFERENTIAL (CANCER CENTER ONLY)
Abs Immature Granulocytes: 0.05 10*3/uL (ref 0.00–0.07)
Basophils Absolute: 0 10*3/uL (ref 0.0–0.1)
Basophils Relative: 0 %
Eosinophils Absolute: 0.1 10*3/uL (ref 0.0–0.5)
Eosinophils Relative: 1 %
HCT: 37.6 % (ref 36.0–46.0)
Hemoglobin: 12.5 g/dL (ref 12.0–15.0)
Immature Granulocytes: 1 %
Lymphocytes Relative: 24 %
Lymphs Abs: 2.4 10*3/uL (ref 0.7–4.0)
MCH: 30 pg (ref 26.0–34.0)
MCHC: 33.2 g/dL (ref 30.0–36.0)
MCV: 90.4 fL (ref 80.0–100.0)
Monocytes Absolute: 0.6 10*3/uL (ref 0.1–1.0)
Monocytes Relative: 6 %
Neutro Abs: 6.6 10*3/uL (ref 1.7–7.7)
Neutrophils Relative %: 68 %
Platelet Count: 244 10*3/uL (ref 150–400)
RBC: 4.16 MIL/uL (ref 3.87–5.11)
RDW: 15.1 % (ref 11.5–15.5)
WBC Count: 9.7 10*3/uL (ref 4.0–10.5)
nRBC: 0 % (ref 0.0–0.2)

## 2019-09-30 MED ORDER — DEXAMETHASONE 4 MG PO TABS: ORAL_TABLET | ORAL | 3 refills | Status: DC

## 2019-09-30 NOTE — Progress Notes (Signed)
[facility] OFFICE PROGRESS NOTE  Patient Care Team: Buzzy Han, MD as PCP - General (Family Medicine) Troy Sine, MD as PCP - Cardiology (Cardiology)  HEME/ONC OVERVIEW: 1. 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, unresectable, MET exon 14 splice variant deletion+ -Late 05/2019:   A large R lung mass extending from the RUL to RLL with mediastinal invasion, R hilar denopathy on CT; no mets in abdomen  Questionable small foci in L frontal and R parietal lobes on MRI brain, limited due to motion degradation; repeat MRI still limited by   Bronch w/ R lung bx, c/w adenocarcinoma; MET exon 14 splice variant deletion+  Large FDG-avid R lung mass with extension to the R hilum and paratracheal LN involvement; no distant mets  -07/2019 - present: palliative capmatinib   09/2019: improving RUL and RLL malignancy and nodal metastasis; no new or metastatic disease   TREATMENT SUMMARY:  07/22/2019 - present: capmatinib 437m BID   ASSESSMENT & PLAN:   Stage IIIB (([id number] adenocarcinoma of the R lung, unresectable, MET exon 14 splice variant deletion+ -Patient is tolerating capmatinib relatively well, except mild to moderate fluid retention (see the management of toxicity below) -I independently reviewed the radiologic images of recent CT chest, abdomen and pelvis, and agree with the findings documented.  In summary, CT showed improving RUL and RLL malignancy and nodal metastases, and there was no evidence of worsening or metastatic disease. -I reviewed imaging results in detail with patient -Labs adequate today, continue capmatinib 4096mBID for now  -We will monitor her disease with q2-56m74monthT CAP.  Of note, patient has a history of contrast allergy, and requires premedication with Benadryl and prednisone -Periodic TSH monitoring   Fluid retention -Secondary to capmatinib -Exam notable for 2+ bilateral lower extremity swelling extending to the  knees and 1+ swelling in the dorsal surface of the hands -I have prescribed dexamethasone 2 mg BID x 1 week, and if tolerating well, she can increase it to 4 mg BID -I will see her back in 2 weeks to assess her response to steroid -Given her recent orthostatic hypotension secondary to dehydration, I will hold off on diuretics unless her fluid retention does not improve with steroids  Photosensitivity -Secondary to vitreous bleeding; not related to capmatinib -Patient is receiving treatment by her ophthalmologist -I will defer further management  No orders of the defined types were placed in this encounter.  All questions were answered. The patient knows to call the clinic with any problems, questions or concerns. No barriers to learning was detected.  Return in 2 weeks for labs and clinic follow-up.  YanTish MenD 2/17/20213:58 PM  CHIEF COMPLAINT: "I am doing much better"  INTERVAL HISTORY: [facility] for follow-up of Stage IIIB adenocarcinoma of the right lung on capmatinib.  Patient was recently seen in the ER for dizziness and found with orthostatic hypotension, thought to be due to dehydration.  She received IV fluids with improvement, and has not had any recurrent dizziness.  Her vision is improving since she was recently diagnosed with vitreous bleeding by her ophthalmologist, and is receiving treatment.  She has noticed progressive swelling in the bilateral lower extremities to the knees, as well as some swelling in her hands.  She denies any other complaint today.  REVIEW OF SYSTEMS:   Constitutional: ( - ) fevers, ( - )  chills , ( - ) night sweats Eyes: ( - ) blurriness of vision, ( - )  double vision, ( - ) watery eyes Ears, nose, mouth, throat, and face: ( - ) mucositis, ( - ) sore throat Respiratory: ( - ) cough, ( - ) dyspnea, ( - ) wheezes Cardiovascular: ( - ) palpitation, ( - ) chest discomfort, ( + ) lower extremity swelling Gastrointestinal:  ( - )  nausea, ( - ) heartburn, ( - ) change in bowel habits Skin: ( - ) abnormal skin rashes Lymphatics: ( - ) new lymphadenopathy, ( - ) easy bruising Neurological: ( - ) numbness, ( - ) tingling, ( - ) new weaknesses Behavioral/Psych: ( - ) mood change, ( - ) new changes  All other systems were reviewed with the patient and are negative.  SUMMARY OF ONCOLOGIC HISTORY: Oncology History  Adenocarcinoma of right lung ([place])  06/08/2019 Imaging   CT chest: IMPRESSION: 1. Central RIGHT UPPER LOBE lung mass with mediastinal extension as described above and associated post-obstructive atelectasis and pneumonitis in the RIGHT UPPER LOBE, associated with necrosis. 2. Second mass is suspected in the superior segment LEFT LOWER LOBE with associated pneumonitis and necrosis. 3. RIGHT hilar lymphadenopathy, likely metastatic. 4. No evidence of metastatic disease elsewhere. 5. Approximate 1.2 cm RIGHT lobe thyroid nodule, not present on the 2008 CT. Thyroid ultrasound may be confirmatory.   06/09/2019 Initial Diagnosis   Lung cancer ([place])   06/09/2019 Imaging   MRI brain: IMPRESSION: 1. Severely motion degraded study, which limits the detection of metastatic lesions. The below findings are nonspecific due to the degree of artifact and repeating the examination should be considered when the patient can be made more comfortable and able to tolerate the length of the study. 2. Punctate focus of abnormal diffusion restriction within the anterior right parietal lobe may indicate a small area of acute ischemia. Small metastases can also cause diffusion restriction, but there is no corresponding contrast enhancement visible. 3. 2 small foci of contrast enhancement within the left frontal lobe and right parietal lobe. These may be vascular, though small metastases could have this appearance.IMPRESSION: 1. Severely motion degraded study, which limits the detection of metastatic lesions. The below  findings are nonspecific due to the degree of artifact and repeating the examination should be considered when the patient can be made more comfortable and able to tolerate the length of the study. 2. Punctate focus of abnormal diffusion restriction within the anterior right parietal lobe may indicate a small area of acute ischemia. Small metastases can also cause diffusion restriction, but there is no corresponding contrast enhancement visible. 3. 2 small foci of contrast enhancement within the left frontal lobe and right parietal lobe. These may be vascular, though small metastases could have this appearance.   06/09/2019 Imaging   CT abdomen/pelvis: IMPRESSION: 1. No CT findings for metastatic disease involving the abdomen/pelvis or bony structures. 2. Status post cholecystectomy with associated mild biliary dilatation. 3. Advanced atherosclerotic calcifications involving the aorta and iliac arteries but no aneurysm or dissection.   06/26/2019 Imaging   PET:  IMPRESSION: 1. Hypermetabolic right upper/right lower lobe thick-walled cavitary mass with a hypermetabolic ipsilateral low right paratracheal lymph node, findings most consistent with primary bronchogenic carcinoma (T4 N2 M0 or stage IIIB disease). 2. Aortic atherosclerosis (ICD10-170.0). Coronary artery calcification. 3.  Emphysema (ICD10-J43.9).   06/26/2019 Imaging   MRI brain w/o contrast: IMPRESSION: The patient was able to hold still for the unenhanced images however was moving during the postcontrast on the images, limiting evaluation for metastatic disease   No definite metastatic  deposits identified   Atrophy and chronic microvascular ischemic change in the white matter.   07/22/2019 Cancer Staging   Staging form: Lung, AJCC 8th Edition - Clinical: Stage IIIB (cT4, cN2, cM0) - Signed by Tish Men, MD on 07/22/2019   09/14/2019 Imaging   CT chest, abdomen and pelvis:   IMPRESSION: Mass-like cavitary  lesion in the right upper and lower lobes, corresponding to the patient's known primary bronchogenic neoplasm, improved. Decreased soft tissue component.   Improving right suprahilar nodal metastasis.   No evidence of metastatic disease in the abdomen/pelvis.   Aortic Atherosclerosis (ICD10-I70.0) and Emphysema (ICD10-J43.9).     I have reviewed the past medical history, past surgical history, social history and family history with the patient and they are unchanged from previous note.  ALLERGIES:  is allergic to acetaminophen; benadryl [diphenhydramine]; zetia [ezetimibe]; gabapentin; hydrochlorothiazide; ace inhibitors; codeine; erythromycin; etodolac; and ivp dye [iodinated diagnostic agents].  MEDICATIONS:  Current Outpatient Medications  Medication Sig Dispense Refill  . amLODipine (NORVASC) 5 MG tablet Take 5 mg by mouth daily.    Marland Kitchen aspirin 81 MG tablet Take 81 mg by mouth daily.    . Calcium 1500 MG tablet Take 1,500 mg by mouth daily.    . capmatinib (TABRECTA) 200 MG tablet Take 2 tablets (400 mg total) by mouth 2 (two) times daily. 112 tablet 11  . cholecalciferol (VITAMIN D) 1000 UNITS tablet Take 1,000 Units by mouth daily. Take 1 tab daily    . NON FORMULARY at bedtime. BiPAP    . Omega-3 Fatty Acids (FISH OIL) 1000 MG CAPS Take 1,000 mg by mouth daily.     . ondansetron (ZOFRAN) 8 MG tablet Take 1 tablet (8 mg total) by mouth every 8 (eight) hours as needed for nausea or vomiting. 30 tablet 2  . polyvinyl alcohol (LIQUIFILM TEARS) 1.4 % ophthalmic solution Place 1 drop into both eyes as needed for dry eyes.    Marland Kitchen prochlorperazine (COMPAZINE) 10 MG tablet Take 1 tablet (10 mg total) by mouth every 6 (six) hours as needed for nausea or vomiting. 30 tablet 3  . RESTASIS 0.05 % ophthalmic emulsion Place 1 drop into both eyes 2 (two) times daily. Use 1 drop in each eye twice a day    . rosuvastatin (CRESTOR) 5 MG tablet Take 1 tablet (5 mg total) by mouth every other day. 45  tablet 2  . acetaminophen (TYLENOL) 325 MG tablet Take 650 mg by mouth every 6 (six) hours as needed for mild pain or headache.    . dexamethasone (DECADRON) 4 MG tablet Take half a tab twice a day for one week, and if well tolerated, increase to 1 tab twice a day. Take with food. 60 tablet 3   No current facility-administered medications for this visit.    PHYSICAL EXAMINATION: ECOG PERFORMANCE STATUS: 2 - Symptomatic, <50% confined to bed  Today's Vitals   09/30/19 1517 09/30/19 1523  BP:  (!) 147/87  Pulse:  83  Resp:  20  Temp:  98.3 F (36.8 C)  TempSrc:  Temporal  SpO2:  98%  Weight:  146 lb 8 oz (66.5 kg)  Height:  4' 11" (1.499 m)  PainSc: 0-No pain    Body mass index is 29.59 kg/m.  Filed Weights   09/30/19 1523  Weight: 146 lb 8 oz (66.5 kg)    GENERAL: alert, no distress and comfortable SKIN: skin color, texture, turgor are normal, no rashes or significant lesions EYES: conjunctiva are pink  and non-injected, sclera clear OROPHARYNX: no exudate, no erythema; lips, buccal mucosa, and tongue normal  NECK: supple, non-tender LUNGS: clear to auscultation with normal breathing effort HEART: regular rate & rhythm and no murmurs and 2+ bilateral lower extremity edema to the knees  ABDOMEN: soft, non-tender, non-distended, normal bowel sounds Musculoskeletal: no cyanosis of digits and no clubbing  PSYCH: alert & oriented x 3, fluent speech NEURO: no focal motor/sensory deficits  LABORATORY DATA:  I have reviewed the data as listed    Component Value Date/Time   NA 140 09/30/2019 1438   NA 140 09/22/2018 1102   K 3.6 09/30/2019 1438   CL 107 09/30/2019 1438   CO2 26 09/30/2019 1438   GLUCOSE 112 (H) 09/30/2019 1438   BUN 11 09/30/2019 1438   BUN 15 09/22/2018 1102   CREATININE 1.21 (H) 09/30/2019 1438   CREATININE 1.35 (H) 01/16/2017 0817   CALCIUM 7.8 (L) 09/30/2019 1438   PROT 5.8 (L) 09/30/2019 1438   PROT 6.8 09/22/2018 1102   ALBUMIN 2.7 (L) 09/30/2019  1438   ALBUMIN 3.9 09/22/2018 1102   AST 16 09/30/2019 1438   ALT 19 09/30/2019 1438   ALKPHOS 85 09/30/2019 1438   BILITOT 0.3 09/30/2019 1438   GFRNONAA 39 (L) 09/30/2019 1438   GFRAA 46 (L) 09/30/2019 1438    No results found for: SPEP, UPEP  Lab Results  Component Value Date   WBC 9.7 09/30/2019   NEUTROABS 6.6 09/30/2019   HGB 12.5 09/30/2019   HCT 37.6 09/30/2019   MCV 90.4 09/30/2019   PLT 244 09/30/2019      Chemistry      Component Value Date/Time   NA 140 09/30/2019 1438   NA 140 09/22/2018 1102   K 3.6 09/30/2019 1438   CL 107 09/30/2019 1438   CO2 26 09/30/2019 1438   BUN 11 09/30/2019 1438   BUN 15 09/22/2018 1102   CREATININE 1.21 (H) 09/30/2019 1438   CREATININE 1.35 (H) 01/16/2017 0817      Component Value Date/Time   CALCIUM 7.8 (L) 09/30/2019 1438   ALKPHOS 85 09/30/2019 1438   AST 16 09/30/2019 1438   ALT 19 09/30/2019 1438   BILITOT 0.3 09/30/2019 1438       RADIOGRAPHIC STUDIES: I have personally reviewed the radiological images as listed below and agreed with the findings in the report. CT Head Wo Contrast  Result Date: 09/16/2019 CLINICAL DATA:  Weakness, right-sided non-small cell lung cancer, dizziness EXAM: CT HEAD WITHOUT CONTRAST TECHNIQUE: Contiguous axial images were obtained from the base of the skull through the vertex without intravenous contrast. COMPARISON:  07/26/2017. 06/26/2019 FINDINGS: Brain: No acute infarct or hemorrhage. Chronic small vessel ischemic changes are seen within the right external capsule and right frontal periventricular white matter. Lateral ventricles and remaining midline structures are unremarkable. No acute extra-axial fluid collections. No mass effect. Vascular: No hyperdense vessel or unexpected calcification. Skull: Normal. Negative for fracture or focal lesion. Sinuses/Orbits: No acute finding. Other: None IMPRESSION: 1. Stable head CT, no acute intracranial process. Electronically Signed   By: Randa Ngo M.D.   On: 09/16/2019 13:38   CT chest w/ contrast  Result Date: 09/14/2019 CLINICAL DATA:  Non-small cell lung cancer, on chemotherapy EXAM: CT CHEST, ABDOMEN, AND PELVIS WITH CONTRAST TECHNIQUE: Multidetector CT imaging of the chest, abdomen and pelvis was performed following the standard protocol during bolus administration of intravenous contrast. CONTRAST:  46m OMNIPAQUE IOHEXOL 300 MG/ML  SOLN COMPARISON:  PET-CT dated  06/26/2019. CT chest dated 06/08/2019. FINDINGS: CT CHEST FINDINGS Cardiovascular: Heart is normal in size. No pericardial effusion. No evidence of thoracic aortic aneurysm. Atherosclerotic calcifications of the aortic arch. Coronary atherosclerosis of the LAD and right coronary artery. Mediastinum/Nodes: Abnormal soft tissue in the medial right upper lobe/suprahilar region, measuring 2.0 cm short axis, previously 3.5 cm. 12 mm right thyroid nodule, unchanged. Lungs/Pleura: Mass-like cavitary lesion in the right upper lobe extending to the superior segment right lower lobe, measuring 7.9 cm (AP on axial image 35) x 8.3 cm x 5.0 cm (coronal image 90). Previously, this measured approximately 10.2 x 10.1 x 7.8 cm when measured in a similar fashion on 06/08/2019. Significantly decreased soft tissue component, now predominantly cavitary with mild surrounding ground-glass. Mild centrilobular and paraseptal emphysematous changes, upper lung predominant. No focal consolidation. No pleural effusion or pneumothorax. Musculoskeletal: Degenerative changes of the thoracic spine. CT ABDOMEN PELVIS FINDINGS Hepatobiliary: Liver is within normal limits. No suspicious/enhancing hepatic lesions. Status post cholecystectomy. Mild central intrahepatic and extrahepatic ductal dilatation. Central common duct measures 12 mm, likely postsurgical. Pancreas: Within normal limits. Spleen: Within normal limits. Adrenals/Urinary Tract: Adrenal glands are within normal limits. 15 mm right upper pole renal cyst.  Left renal sinus cysts and a 2.0 cm left lower pole renal cysts. No hydronephrosis. Bladder is within normal limits. Stomach/Bowel: Stomach is within normal limits. No evidence of bowel obstruction. Appendix is not discretely visualized. Sigmoid diverticulosis, without evidence of diverticulitis. Vascular/Lymphatic: No evidence of abdominal aortic aneurysm. Atherosclerotic calcifications of the abdominal aorta and branch vessels. No suspicious abdominopelvic lymphadenopathy. Reproductive: Status post hysterectomy. No adnexal masses. Other: No abdominopelvic ascites. Musculoskeletal: Degenerative changes of the lumbar spine. IMPRESSION: Mass-like cavitary lesion in the right upper and lower lobes, corresponding to the patient's known primary bronchogenic neoplasm, improved. Decreased soft tissue component. Improving right suprahilar nodal metastasis. No evidence of metastatic disease in the abdomen/pelvis. Aortic Atherosclerosis (ICD10-I70.0) and Emphysema (ICD10-J43.9). Electronically Signed   By: Julian Hy M.D.   On: 09/14/2019 14:46   CT AP w/ contrast  Result Date: 09/14/2019 CLINICAL DATA:  Non-small cell lung cancer, on chemotherapy EXAM: CT CHEST, ABDOMEN, AND PELVIS WITH CONTRAST TECHNIQUE: Multidetector CT imaging of the chest, abdomen and pelvis was performed following the standard protocol during bolus administration of intravenous contrast. CONTRAST:  27m OMNIPAQUE IOHEXOL 300 MG/ML  SOLN COMPARISON:  PET-CT dated 06/26/2019. CT chest dated 06/08/2019. FINDINGS: CT CHEST FINDINGS Cardiovascular: Heart is normal in size. No pericardial effusion. No evidence of thoracic aortic aneurysm. Atherosclerotic calcifications of the aortic arch. Coronary atherosclerosis of the LAD and right coronary artery. Mediastinum/Nodes: Abnormal soft tissue in the medial right upper lobe/suprahilar region, measuring 2.0 cm short axis, previously 3.5 cm. 12 mm right thyroid nodule, unchanged. Lungs/Pleura: Mass-like  cavitary lesion in the right upper lobe extending to the superior segment right lower lobe, measuring 7.9 cm (AP on axial image 35) x 8.3 cm x 5.0 cm (coronal image 90). Previously, this measured approximately 10.2 x 10.1 x 7.8 cm when measured in a similar fashion on 06/08/2019. Significantly decreased soft tissue component, now predominantly cavitary with mild surrounding ground-glass. Mild centrilobular and paraseptal emphysematous changes, upper lung predominant. No focal consolidation. No pleural effusion or pneumothorax. Musculoskeletal: Degenerative changes of the thoracic spine. CT ABDOMEN PELVIS FINDINGS Hepatobiliary: Liver is within normal limits. No suspicious/enhancing hepatic lesions. Status post cholecystectomy. Mild central intrahepatic and extrahepatic ductal dilatation. Central common duct measures 12 mm, likely postsurgical. Pancreas: Within normal limits. Spleen: Within normal limits.  Adrenals/Urinary Tract: Adrenal glands are within normal limits. 15 mm right upper pole renal cyst. Left renal sinus cysts and a 2.0 cm left lower pole renal cysts. No hydronephrosis. Bladder is within normal limits. Stomach/Bowel: Stomach is within normal limits. No evidence of bowel obstruction. Appendix is not discretely visualized. Sigmoid diverticulosis, without evidence of diverticulitis. Vascular/Lymphatic: No evidence of abdominal aortic aneurysm. Atherosclerotic calcifications of the abdominal aorta and branch vessels. No suspicious abdominopelvic lymphadenopathy. Reproductive: Status post hysterectomy. No adnexal masses. Other: No abdominopelvic ascites. Musculoskeletal: Degenerative changes of the lumbar spine. IMPRESSION: Mass-like cavitary lesion in the right upper and lower lobes, corresponding to the patient's known primary bronchogenic neoplasm, improved. Decreased soft tissue component. Improving right suprahilar nodal metastasis. No evidence of metastatic disease in the abdomen/pelvis. Aortic  Atherosclerosis (ICD10-I70.0) and Emphysema (ICD10-J43.9). Electronically Signed   By: Julian Hy M.D.   On: 09/14/2019 14:46   DG Chest Port 1 View  Result Date: 09/16/2019 CLINICAL DATA:  Weakness. Lung cancer. EXAM: PORTABLE CHEST 1 VIEW COMPARISON:  Chest x-ray dated 06/08/2019 and CT scan of the chest dated 09/14/2019 and PET-CT dated 06/26/2019 FINDINGS: There are areas of parenchymal scarring at the right lung apex as well as in the superior segment of the right lower lobe as demonstrated on the recent CT scan of the chest. The areas of tumor in the right upper lobe and in the right hilum and right lower lobe superior segment have markedly improved. The left lung remains clear. Heart size and vascularity are normal. No effusions. No acute bone abnormality. IMPRESSION: Marked improvement in the areas of tumor in the right upper lobe and right hilum and right lower lobe as compared to the prior chest x-ray of 06/08/2019. No new abnormalities. Electronically Signed   By: Lorriane Shire M.D.   On: 09/16/2019 12:16

## 2019-10-02 ENCOUNTER — Telehealth: Payer: Self-pay | Admitting: Hematology

## 2019-10-02 NOTE — Telephone Encounter (Signed)
Schedule per 2/17 los. Called and spoke with pt, confirmed 3/3 appt

## 2019-10-06 ENCOUNTER — Telehealth: Payer: Self-pay

## 2019-10-06 NOTE — Telephone Encounter (Signed)
Patient called and stated she was prescribed Decadron last week by Dr. Maylon Peppers but " nothing has changed." Patient c/o swollen legs and feet, insomnia, and dizziness. Patient states she has also experienced mild shortness of breath for the first time today. Dr. Maylon Peppers made aware. Per Dr. Maylon Peppers, patient scheduled to be evaluated in [facility] tomorrow with labs at 9 am followed by Symptom Management at 9:30 am. Patient is aware of appointment times and verbalized understanding. Patient also voiced understanding that if symptoms worsen before being seen tomorrow then she should proceed to the ED.

## 2019-10-07 ENCOUNTER — Inpatient Hospital Stay (HOSPITAL_BASED_OUTPATIENT_CLINIC_OR_DEPARTMENT_OTHER): Payer: Medicare HMO | Admitting: Medical

## 2019-10-07 ENCOUNTER — Other Ambulatory Visit: Payer: Self-pay

## 2019-10-07 ENCOUNTER — Inpatient Hospital Stay: Payer: Medicare HMO

## 2019-10-07 VITALS — BP 148/68 | HR 72 | Temp 98.0°F | Resp 20 | Wt 143.2 lb

## 2019-10-07 DIAGNOSIS — C3491 Malignant neoplasm of unspecified part of right bronchus or lung: Secondary | ICD-10-CM

## 2019-10-07 DIAGNOSIS — R609 Edema, unspecified: Secondary | ICD-10-CM

## 2019-10-07 DIAGNOSIS — C3481 Malignant neoplasm of overlapping sites of right bronchus and lung: Secondary | ICD-10-CM | POA: Diagnosis not present

## 2019-10-07 LAB — CMP (CANCER CENTER ONLY)
ALT: 21 U/L (ref 0–44)
AST: 17 U/L (ref 15–41)
Albumin: 2.9 g/dL — ABNORMAL LOW (ref 3.5–5.0)
Alkaline Phosphatase: 89 U/L (ref 38–126)
Anion gap: 8 (ref 5–15)
BUN: 20 mg/dL (ref 8–23)
CO2: 28 mmol/L (ref 22–32)
Calcium: 8.3 mg/dL — ABNORMAL LOW (ref 8.9–10.3)
Chloride: 106 mmol/L (ref 98–111)
Creatinine: 1.18 mg/dL — ABNORMAL HIGH (ref 0.44–1.00)
GFR, Est AFR Am: 47 mL/min — ABNORMAL LOW (ref >60)
GFR, Estimated: 41 mL/min — ABNORMAL LOW (ref >60)
Glucose, Bld: 90 mg/dL (ref 70–99)
Potassium: 4.2 mmol/L (ref 3.5–5.1)
Sodium: 142 mmol/L (ref 135–145)
Total Bilirubin: 0.5 mg/dL (ref 0.3–1.2)
Total Protein: 6.2 g/dL — ABNORMAL LOW (ref 6.5–8.1)

## 2019-10-07 LAB — CBC WITH DIFFERENTIAL (CANCER CENTER ONLY)
Abs Immature Granulocytes: 0.21 10*3/uL — ABNORMAL HIGH (ref 0.00–0.07)
Basophils Absolute: 0.1 10*3/uL (ref 0.0–0.1)
Basophils Relative: 1 %
Eosinophils Absolute: 0.1 10*3/uL (ref 0.0–0.5)
Eosinophils Relative: 1 %
HCT: 42.3 % (ref 36.0–46.0)
Hemoglobin: 14 g/dL (ref 12.0–15.0)
Immature Granulocytes: 1 %
Lymphocytes Relative: 22 %
Lymphs Abs: 3.4 10*3/uL (ref 0.7–4.0)
MCH: 30.3 pg (ref 26.0–34.0)
MCHC: 33.1 g/dL (ref 30.0–36.0)
MCV: 91.6 fL (ref 80.0–100.0)
Monocytes Absolute: 1.3 10*3/uL — ABNORMAL HIGH (ref 0.1–1.0)
Monocytes Relative: 9 %
Neutro Abs: 10.2 10*3/uL — ABNORMAL HIGH (ref 1.7–7.7)
Neutrophils Relative %: 66 %
Platelet Count: 314 10*3/uL (ref 150–400)
RBC: 4.62 MIL/uL (ref 3.87–5.11)
RDW: 15.4 % (ref 11.5–15.5)
WBC Count: 15.3 10*3/uL — ABNORMAL HIGH (ref 4.0–10.5)
nRBC: 0 % (ref 0.0–0.2)

## 2019-10-07 MED ORDER — FUROSEMIDE 20 MG PO TABS: 20.0000 mg | ORAL_TABLET | Freq: Every day | ORAL | 1 refills | Status: DC

## 2019-10-07 NOTE — Patient Instructions (Signed)
Peripheral Edema  Peripheral edema is swelling that is caused by a buildup of fluid. Peripheral edema most often affects the lower legs, ankles, and feet. It can also develop in the arms, hands, and face. The area of the body that has peripheral edema will look swollen. It may also feel heavy or warm. Your clothes may start to feel tight. Pressing on the area may make a temporary dent in your skin. You may not be able to move your swollen arm or leg as much as usual. There are many causes of peripheral edema. It can happen because of a complication of other conditions such as congestive heart failure, kidney disease, or a problem with your blood circulation. It also can be a side effect of certain medicines or because of an infection. It often happens to women during pregnancy. Sometimes, the cause is not known. Follow these instructions at home: Managing pain, stiffness, and swelling   Raise (elevate) your legs while you are sitting or lying down.  Move around often to prevent stiffness and to lessen swelling.  Do not sit or stand for long periods of time.  Wear support stockings as told by your health care provider. Medicines  Take over-the-counter and prescription medicines only as told by your health care provider.  Your health care provider may prescribe medicine to help your body get rid of excess water (diuretic). General instructions  Pay attention to any changes in your symptoms.  Follow instructions from your health care provider about limiting salt (sodium) in your diet. Sometimes, eating less salt may reduce swelling.  Moisturize skin daily to help prevent skin from cracking and draining.  Keep all follow-up visits as told by your health care provider. This is important. Contact a health care provider if you have:  A fever.  Edema that starts suddenly or is getting worse, especially if you are pregnant or have a medical condition.  Swelling in only one leg.  Increased  swelling, redness, or pain in one or both of your legs.  Drainage or sores at the area where you have edema. Get help right away if you:  Develop shortness of breath, especially when you are lying down.  Have pain in your chest or abdomen.  Feel weak.  Feel faint. Summary  Peripheral edema is swelling that is caused by a buildup of fluid. Peripheral edema most often affects the lower legs, ankles, and feet.  Move around often to prevent stiffness and to lessen swelling. Do not sit or stand for long periods of time.  Pay attention to any changes in your symptoms.  Contact a health care provider if you have edema that starts suddenly or is getting worse, especially if you are pregnant or have a medical condition.  Get help right away if you develop shortness of breath, especially when lying down. This information is not intended to replace advice given to you by your health care provider. Make sure you discuss any questions you have with your health care provider. Document Revised: 04/23/2018 Document Reviewed: 04/23/2018 Elsevier Patient Education  2020 [employer]  

## 2019-10-07 NOTE — Progress Notes (Signed)
[facility] OFFICE PROGRESS NOTE  Patient Care Team: Jo Han, MD as PCP - General (Family Medicine) Jo Sine, MD as PCP - Cardiology (Cardiology)  HEME/ONC OVERVIEW: 1. 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+ -Late 05/2019:   A large R lung mass extending from the RUL to RLL with mediastinal invasion, R hilar denopathy on CT; no mets in abdomen  Questionable small foci in L frontal and R parietal lobes on MRI brain, limited due to motion degradation; repeat MRI still limited by   Bronch w/ R lung bx, c/w adenocarcinoma; MET exon 14 splice variant deletion+  Large FDG-avid R lung mass with extension to the R hilum and paratracheal LN involvement; no distant mets  -07/2019 - present: palliative capmatinib   09/2019: improving RUL and RLL malignancy and nodal metastasis; no new or metastatic disease   TREATMENT SUMMARY:  07/22/2019 - present: capmatinib 466m BID   ASSESSMENT & PLAN:   Stage IIIB (([id number] adenocarcinoma of the R lung, unresectable, MET exon 14 splice variant deletion+ -Patient is tolerating capmatinib relatively well, except mild to moderate fluid retention (see the management of toxicity below) -Dr. ZMaylon Pepperspreviously independently reviewed the radiologic images of recent CT chest, abdomen and pelvis, and agree with the findings documented.  In summary, CT showed improving RUL and RLL malignancy and nodal metastases, and there was no evidence of worsening or metastatic disease. -Dr. ZMaylon PepperspreviouslyI reviewed imaging results in detail with patient -The patient's labs were reviewed today and were adequate to continue capmatinib 4085mBID for now  -Plans are to monitor her disease with q2-73m19monthT CAP.  Of note, patient has a history of contrast allergy, and requires premedication with Benadryl and prednisone -Periodic TSH monitoring   Fluid retention -Secondary to  capmatinib -She continues to haver 2+ bilateral lower extremity swelling extending to the knees and 1+ swelling in the dorsal surface of the hands -She will continue on dexamethasone 2 mg BID  -Lasix 20 mg once daily x 3 days was added today. She was told to stop after 3 days and to restart Lasix 20 mg once daily x 3 days again if needed after she has been off of this for 2 to 3 days.  -She was told to avoid extra sodium -She was instructed to push fluids as to not become dehydrated -She will elevate her lower extremities above the height of her hips -She was encouraged to use thigh high compression stockings -She will be seen back in 2 weeks as scheduled  No orders of the defined types were placed in this encounter.  All questions were answered. The patient knows to call the clinic with any problems, questions or concerns. No barriers to learning was detected.  Return in 2 weeks for labs and clinic follow-up.  [facility] 2/24/202110:05 AM  CHIEF COMPLAINT: "I am doing much better"  INTERVAL HISTORY: Ms. RhuGoransonturns clinic after seeing [facility]rix 02/17/2021when she reported progressive swelling in the bilateral lower extremities to the knees, as well as some swelling in her hands.  She was prescribed dexamethasone 2 mg BID x 1 week with instructions to increase it to 4 mg BID if she was tolerating this medication. She just completed 7 days of dexamethasone and has not yet increased her dose to 4 mg BID. The following message was received yesterday:  "Patient called and stated she was prescribed Decadron last week by Jo [facility]  but " nothing has changed." Patient c/o swollen legs and feet, insomnia, and dizziness. Patient states she has also experienced mild shortness of breath for the first time today. [facility] made aware. Per [facility], patient scheduled to be evaluated in [facility] tomorrow with labs at 9 am followed by Symptom Management at 9:30 am. Patient is  aware of appointment times and verbalized understanding. Patient also voiced understanding that if symptoms worsen before being seen tomorrow then she should proceed to the ED."  REVIEW OF SYSTEMS:   Constitutional: ( - ) fevers, ( - )  chills , ( - ) night sweats Eyes: ( - ) blurriness of vision, ( - ) double vision, ( - ) watery eyes Ears, nose, mouth, throat, and face: ( - ) mucositis, ( - ) sore throat Respiratory: ( - ) cough, (+) dyspnea, ( - ) wheezes (+) SOB Cardiovascular: ( - ) palpitation, ( - ) chest discomfort, ( + ) lower extremity swelling Gastrointestinal:  ( - ) nausea, ( - ) heartburn, ( - ) change in bowel habits Skin: ( - ) abnormal skin rashes Lymphatics: ( - ) new lymphadenopathy, ( - ) easy bruising (+) bilateral lower extremity edema Neurological: ( - ) numbness, ( - ) tingling, ( - ) new weaknesses Behavioral/Psych: ( - ) mood change, ( - ) new changes  All other systems were reviewed with the patient and are negative.  SUMMARY OF ONCOLOGIC HISTORY: Oncology History  Adenocarcinoma of right lung ([place])  06/08/2019 Imaging   CT chest: IMPRESSION: 1. Central RIGHT UPPER LOBE lung mass with mediastinal extension as described above and associated post-obstructive atelectasis and pneumonitis in the RIGHT UPPER LOBE, associated with necrosis. 2. Second mass is suspected in the superior segment LEFT LOWER LOBE with associated pneumonitis and necrosis. 3. RIGHT hilar lymphadenopathy, likely metastatic. 4. No evidence of metastatic disease elsewhere. 5. Approximate 1.2 cm RIGHT lobe thyroid nodule, not present on the 2008 CT. Thyroid ultrasound may be confirmatory.   06/09/2019 Initial Diagnosis   Lung cancer ([place])   06/09/2019 Imaging   MRI brain: IMPRESSION: 1. Severely motion degraded study, which limits the detection of metastatic lesions. The below findings are nonspecific due to the degree of artifact and repeating the examination should be considered when  the patient can be made more comfortable and able to tolerate the length of the study. 2. Punctate focus of abnormal diffusion restriction within the anterior right parietal lobe may indicate a small area of acute ischemia. Small metastases can also cause diffusion restriction, but there is no corresponding contrast enhancement visible. 3. 2 small foci of contrast enhancement within the left frontal lobe and right parietal lobe. These may be vascular, though small metastases could have this appearance.IMPRESSION: 1. Severely motion degraded study, which limits the detection of metastatic lesions. The below findings are nonspecific due to the degree of artifact and repeating the examination should be considered when the patient can be made more comfortable and able to tolerate the length of the study. 2. Punctate focus of abnormal diffusion restriction within the anterior right parietal lobe may indicate a small area of acute ischemia. Small metastases can also cause diffusion restriction, but there is no corresponding contrast enhancement visible. 3. 2 small foci of contrast enhancement within the left frontal lobe and right parietal lobe. These may be vascular, though small metastases could have this appearance.   06/09/2019 Imaging   CT abdomen/pelvis: IMPRESSION: 1. No CT findings for metastatic disease  involving the abdomen/pelvis or bony structures. 2. Status post cholecystectomy with associated mild biliary dilatation. 3. Advanced atherosclerotic calcifications involving the aorta and iliac arteries but no aneurysm or dissection.   06/26/2019 Imaging   PET:  IMPRESSION: 1. Hypermetabolic right upper/right lower lobe thick-walled cavitary mass with a hypermetabolic ipsilateral low right paratracheal lymph node, findings most consistent with primary bronchogenic carcinoma (T4 N2 M0 or stage IIIB disease). 2. Aortic atherosclerosis (ICD10-170.0). Coronary  artery calcification. 3.  Emphysema (ICD10-J43.9).   06/26/2019 Imaging   MRI brain w/o contrast: IMPRESSION: The patient was able to hold still for the unenhanced images however was moving during the postcontrast on the images, limiting evaluation for metastatic disease   No definite metastatic deposits identified   Atrophy and chronic microvascular ischemic change in the white matter.   07/22/2019 Cancer Staging   Staging form: Lung, AJCC 8th Edition - Clinical: Stage IIIB (cT4, cN2, cM0) - Signed by Tish Men, MD on 07/22/2019   09/14/2019 Imaging   CT chest, abdomen and pelvis:   IMPRESSION: Mass-like cavitary lesion in the right upper and lower lobes, corresponding to the patient's known primary bronchogenic neoplasm, improved. Decreased soft tissue component.   Improving right suprahilar nodal metastasis.   No evidence of metastatic disease in the abdomen/pelvis.   Aortic Atherosclerosis (ICD10-I70.0) and Emphysema (ICD10-J43.9).     I have reviewed the past medical history, past surgical history, social history and family history with the patient and they are unchanged from previous note.  ALLERGIES:  is allergic to acetaminophen; benadryl [diphenhydramine]; zetia [ezetimibe]; gabapentin; hydrochlorothiazide; ace inhibitors; codeine; erythromycin; etodolac; and ivp dye [iodinated diagnostic agents].  MEDICATIONS:  Current Outpatient Medications  Medication Sig Dispense Refill  . acetaminophen (TYLENOL) 325 MG tablet Take 650 mg by mouth every 6 (six) hours as needed for mild pain or headache.    Marland Kitchen amLODipine (NORVASC) 5 MG tablet Take 5 mg by mouth daily.    Marland Kitchen aspirin 81 MG tablet Take 81 mg by mouth daily.    . Calcium 1500 MG tablet Take 1,500 mg by mouth daily.    . capmatinib (TABRECTA) 200 MG tablet Take 2 tablets (400 mg total) by mouth 2 (two) times daily. 112 tablet 11  . cholecalciferol (VITAMIN D) 1000 UNITS tablet Take 1,000 Units by mouth daily. Take 1  tab daily    . dexamethasone (DECADRON) 4 MG tablet Take half a tab twice a day for one week, and if well tolerated, increase to 1 tab twice a day. Take with food. 60 tablet 3  . furosemide (LASIX) 20 MG tablet Take 1 tablet (20 mg total) by mouth daily. Take for 3 days only then stoop. Repeat for 3 days as needed. 30 tablet 1  . NON FORMULARY at bedtime. BiPAP    . Omega-3 Fatty Acids (FISH OIL) 1000 MG CAPS Take 1,000 mg by mouth daily.     . ondansetron (ZOFRAN) 8 MG tablet Take 1 tablet (8 mg total) by mouth every 8 (eight) hours as needed for nausea or vomiting. 30 tablet 2  . polyvinyl alcohol (LIQUIFILM TEARS) 1.4 % ophthalmic solution Place 1 drop into both eyes as needed for dry eyes.    Marland Kitchen prochlorperazine (COMPAZINE) 10 MG tablet Take 1 tablet (10 mg total) by mouth every 6 (six) hours as needed for nausea or vomiting. 30 tablet 3  . RESTASIS 0.05 % ophthalmic emulsion Place 1 drop into both eyes 2 (two) times daily. Use 1 drop in each eye twice  a day    . rosuvastatin (CRESTOR) 5 MG tablet Take 1 tablet (5 mg total) by mouth every other day. 45 tablet 2   No current facility-administered medications for this visit.    PHYSICAL EXAMINATION: ECOG PERFORMANCE STATUS: 2 - Symptomatic, <50% confined to bed  Today's Vitals   10/07/19 0933 10/07/19 0935  BP: (!) 148/68   Pulse: 72   Resp: 20   Temp: 98 F (36.7 C)   TempSrc: Oral   SpO2: 99%   Weight: 143 lb 4 oz (65 kg)   PainSc:  0-No pain   Body mass index is 28.93 kg/m.  Filed Weights   10/07/19 0933  Weight: 143 lb 4 oz (65 kg)    GENERAL: alert, no distress and comfortable SKIN: skin color, texture, turgor are normal, no rashes or significant lesions EYES: conjunctiva are pink and non-injected, sclera clear LUNGS: clear to auscultation with normal breathing effort HEART: regular rate & rhythm and no murmurs and 2+ bilateral lower extremity edema to the knees  Musculoskeletal: no cyanosis of digits and no clubbing   PSYCH: alert & oriented x 3, fluent speech NEURO: no focal motor/sensory deficits  LABORATORY DATA:  I have reviewed the data as listed    Component Value Date/Time   NA 142 10/07/2019 0901   NA 140 09/22/2018 1102   K 4.2 10/07/2019 0901   CL 106 10/07/2019 0901   CO2 28 10/07/2019 0901   GLUCOSE 90 10/07/2019 0901   BUN 20 10/07/2019 0901   BUN 15 09/22/2018 1102   CREATININE 1.18 (H) 10/07/2019 0901   CREATININE 1.35 (H) 01/16/2017 0817   CALCIUM 8.3 (L) 10/07/2019 0901   PROT 6.2 (L) 10/07/2019 0901   PROT 6.8 09/22/2018 1102   ALBUMIN 2.9 (L) 10/07/2019 0901   ALBUMIN 3.9 09/22/2018 1102   AST 17 10/07/2019 0901   ALT 21 10/07/2019 0901   ALKPHOS 89 10/07/2019 0901   BILITOT 0.5 10/07/2019 0901   GFRNONAA 41 (L) 10/07/2019 0901   GFRAA 47 (L) 10/07/2019 0901    No results found for: SPEP, UPEP  Lab Results  Component Value Date   WBC 15.3 (H) 10/07/2019   NEUTROABS 10.2 (H) 10/07/2019   HGB 14.0 10/07/2019   HCT 42.3 10/07/2019   MCV 91.6 10/07/2019   PLT 314 10/07/2019      Chemistry      Component Value Date/Time   NA 142 10/07/2019 0901   NA 140 09/22/2018 1102   K 4.2 10/07/2019 0901   CL 106 10/07/2019 0901   CO2 28 10/07/2019 0901   BUN 20 10/07/2019 0901   BUN 15 09/22/2018 1102   CREATININE 1.18 (H) 10/07/2019 0901   CREATININE 1.35 (H) 01/16/2017 0817      Component Value Date/Time   CALCIUM 8.3 (L) 10/07/2019 0901   ALKPHOS 89 10/07/2019 0901   AST 17 10/07/2019 0901   ALT 21 10/07/2019 0901   BILITOT 0.5 10/07/2019 0901       RADIOGRAPHIC STUDIES: I have personally reviewed the radiological images as listed below and agreed with the findings in the report. CT Head Wo Contrast  Result Date: 09/16/2019 CLINICAL DATA:  Weakness, right-sided non-small cell lung cancer, dizziness EXAM: CT HEAD WITHOUT CONTRAST TECHNIQUE: Contiguous axial images were obtained from the base of the skull through the vertex without intravenous contrast.  COMPARISON:  07/26/2017. 06/26/2019 FINDINGS: Brain: No acute infarct or hemorrhage. Chronic small vessel ischemic changes are seen within the right external capsule and  right frontal periventricular white matter. Lateral ventricles and remaining midline structures are unremarkable. No acute extra-axial fluid collections. No mass effect. Vascular: No hyperdense vessel or unexpected calcification. Skull: Normal. Negative for fracture or focal lesion. Sinuses/Orbits: No acute finding. Other: None IMPRESSION: 1. Stable head CT, no acute intracranial process. Electronically Signed   By: Randa Ngo M.D.   On: 09/16/2019 13:38   CT chest w/ contrast  Result Date: 09/14/2019 CLINICAL DATA:  Non-small cell lung cancer, on chemotherapy EXAM: CT CHEST, ABDOMEN, AND PELVIS WITH CONTRAST TECHNIQUE: Multidetector CT imaging of the chest, abdomen and pelvis was performed following the standard protocol during bolus administration of intravenous contrast. CONTRAST:  15m OMNIPAQUE IOHEXOL 300 MG/ML  SOLN COMPARISON:  PET-CT dated 06/26/2019. CT chest dated 06/08/2019. FINDINGS: CT CHEST FINDINGS Cardiovascular: Heart is normal in size. No pericardial effusion. No evidence of thoracic aortic aneurysm. Atherosclerotic calcifications of the aortic arch. Coronary atherosclerosis of the LAD and right coronary artery. Mediastinum/Nodes: Abnormal soft tissue in the medial right upper lobe/suprahilar region, measuring 2.0 cm short axis, previously 3.5 cm. 12 mm right thyroid nodule, unchanged. Lungs/Pleura: Mass-like cavitary lesion in the right upper lobe extending to the superior segment right lower lobe, measuring 7.9 cm (AP on axial image 35) x 8.3 cm x 5.0 cm (coronal image 90). Previously, this measured approximately 10.2 x 10.1 x 7.8 cm when measured in a similar fashion on 06/08/2019. Significantly decreased soft tissue component, now predominantly cavitary with mild surrounding ground-glass. Mild centrilobular and  paraseptal emphysematous changes, upper lung predominant. No focal consolidation. No pleural effusion or pneumothorax. Musculoskeletal: Degenerative changes of the thoracic spine. CT ABDOMEN PELVIS FINDINGS Hepatobiliary: Liver is within normal limits. No suspicious/enhancing hepatic lesions. Status post cholecystectomy. Mild central intrahepatic and extrahepatic ductal dilatation. Central common duct measures 12 mm, likely postsurgical. Pancreas: Within normal limits. Spleen: Within normal limits. Adrenals/Urinary Tract: Adrenal glands are within normal limits. 15 mm right upper pole renal cyst. Left renal sinus cysts and a 2.0 cm left lower pole renal cysts. No hydronephrosis. Bladder is within normal limits. Stomach/Bowel: Stomach is within normal limits. No evidence of bowel obstruction. Appendix is not discretely visualized. Sigmoid diverticulosis, without evidence of diverticulitis. Vascular/Lymphatic: No evidence of abdominal aortic aneurysm. Atherosclerotic calcifications of the abdominal aorta and branch vessels. No suspicious abdominopelvic lymphadenopathy. Reproductive: Status post hysterectomy. No adnexal masses. Other: No abdominopelvic ascites. Musculoskeletal: Degenerative changes of the lumbar spine. IMPRESSION: Mass-like cavitary lesion in the right upper and lower lobes, corresponding to the patient's known primary bronchogenic neoplasm, improved. Decreased soft tissue component. Improving right suprahilar nodal metastasis. No evidence of metastatic disease in the abdomen/pelvis. Aortic Atherosclerosis (ICD10-I70.0) and Emphysema (ICD10-J43.9). Electronically Signed   By: SJulian HyM.D.   On: 09/14/2019 14:46   CT AP w/ contrast  Result Date: 09/14/2019 CLINICAL DATA:  Non-small cell lung cancer, on chemotherapy EXAM: CT CHEST, ABDOMEN, AND PELVIS WITH CONTRAST TECHNIQUE: Multidetector CT imaging of the chest, abdomen and pelvis was performed following the standard protocol during bolus  administration of intravenous contrast. CONTRAST:  657mOMNIPAQUE IOHEXOL 300 MG/ML  SOLN COMPARISON:  PET-CT dated 06/26/2019. CT chest dated 06/08/2019. FINDINGS: CT CHEST FINDINGS Cardiovascular: Heart is normal in size. No pericardial effusion. No evidence of thoracic aortic aneurysm. Atherosclerotic calcifications of the aortic arch. Coronary atherosclerosis of the LAD and right coronary artery. Mediastinum/Nodes: Abnormal soft tissue in the medial right upper lobe/suprahilar region, measuring 2.0 cm short axis, previously 3.5 cm. 12 mm right thyroid nodule, unchanged. Lungs/Pleura:  Mass-like cavitary lesion in the right upper lobe extending to the superior segment right lower lobe, measuring 7.9 cm (AP on axial image 35) x 8.3 cm x 5.0 cm (coronal image 90). Previously, this measured approximately 10.2 x 10.1 x 7.8 cm when measured in a similar fashion on 06/08/2019. Significantly decreased soft tissue component, now predominantly cavitary with mild surrounding ground-glass. Mild centrilobular and paraseptal emphysematous changes, upper lung predominant. No focal consolidation. No pleural effusion or pneumothorax. Musculoskeletal: Degenerative changes of the thoracic spine. CT ABDOMEN PELVIS FINDINGS Hepatobiliary: Liver is within normal limits. No suspicious/enhancing hepatic lesions. Status post cholecystectomy. Mild central intrahepatic and extrahepatic ductal dilatation. Central common duct measures 12 mm, likely postsurgical. Pancreas: Within normal limits. Spleen: Within normal limits. Adrenals/Urinary Tract: Adrenal glands are within normal limits. 15 mm right upper pole renal cyst. Left renal sinus cysts and a 2.0 cm left lower pole renal cysts. No hydronephrosis. Bladder is within normal limits. Stomach/Bowel: Stomach is within normal limits. No evidence of bowel obstruction. Appendix is not discretely visualized. Sigmoid diverticulosis, without evidence of diverticulitis. Vascular/Lymphatic: No  evidence of abdominal aortic aneurysm. Atherosclerotic calcifications of the abdominal aorta and branch vessels. No suspicious abdominopelvic lymphadenopathy. Reproductive: Status post hysterectomy. No adnexal masses. Other: No abdominopelvic ascites. Musculoskeletal: Degenerative changes of the lumbar spine. IMPRESSION: Mass-like cavitary lesion in the right upper and lower lobes, corresponding to the patient's known primary bronchogenic neoplasm, improved. Decreased soft tissue component. Improving right suprahilar nodal metastasis. No evidence of metastatic disease in the abdomen/pelvis. Aortic Atherosclerosis (ICD10-I70.0) and Emphysema (ICD10-J43.9). Electronically Signed   By: Julian Hy M.D.   On: 09/14/2019 14:46   DG Chest Port 1 View  Result Date: 09/16/2019 CLINICAL DATA:  Weakness. Lung cancer. EXAM: PORTABLE CHEST 1 VIEW COMPARISON:  Chest x-ray dated 06/08/2019 and CT scan of the chest dated 09/14/2019 and PET-CT dated 06/26/2019 FINDINGS: There are areas of parenchymal scarring at the right lung apex as well as in the superior segment of the right lower lobe as demonstrated on the recent CT scan of the chest. The areas of tumor in the right upper lobe and in the right hilum and right lower lobe superior segment have markedly improved. The left lung remains clear. Heart size and vascularity are normal. No effusions. No acute bone abnormality. IMPRESSION: Marked improvement in the areas of tumor in the right upper lobe and right hilum and right lower lobe as compared to the prior chest x-ray of 06/08/2019. No new abnormalities. Electronically Signed   By: Lorriane Shire M.D.   On: 09/16/2019 12:16

## 2019-10-08 ENCOUNTER — Ambulatory Visit: Payer: Medicare HMO | Attending: Internal Medicine

## 2019-10-08 DIAGNOSIS — Z23 Encounter for immunization: Secondary | ICD-10-CM

## 2019-10-08 NOTE — Progress Notes (Signed)
   Covid-19 Vaccination Clinic  Name:  Jo Hendrix    MRN: [id number] DOB: June 16, 1927  10/08/2019  [name] was observed post Covid-19 immunization for 15 minutes without incidence. She was provided with Vaccine Information Sheet and instruction to access the V-Safe system.   [name] was instructed to call 911 with any severe reactions post vaccine: Marland Kitchen Difficulty breathing  . Swelling of your face and throat  . A fast heartbeat  . A bad rash all over your body  . Dizziness and weakness    Immunizations Administered    Name Date Dose VIS Date Route   Pfizer COVID-19 Vaccine 10/08/2019 12:12 PM 0.3 mL 07/24/2019 Intramuscular   Manufacturer: [place]   Lot: [id number]   [place]: [id number]

## 2019-10-09 ENCOUNTER — Telehealth: Payer: Self-pay

## 2019-10-09 NOTE — Telephone Encounter (Signed)
Patient called to give an update to Sandi Mealy, PA since her last visit on Wednesday. Patient stated her legs and feet are still swollen, she is jittery, and has difficulty urinating. Patient stated her breathing has improved. Sandi Mealy, PA made aware and per V. Tanner, PA patient instructed to stop taking Lasix and take Decadron once a day. Patient verbalized understanding and is aware to call the office with any questions or concerns prior to her visit with Dr. Maylon Peppers next Wednesday.

## 2019-10-14 ENCOUNTER — Encounter: Payer: Self-pay | Admitting: Hematology

## 2019-10-14 ENCOUNTER — Other Ambulatory Visit: Payer: Self-pay

## 2019-10-14 ENCOUNTER — Inpatient Hospital Stay: Payer: Medicare HMO

## 2019-10-14 ENCOUNTER — Inpatient Hospital Stay: Payer: Medicare HMO | Attending: Hematology | Admitting: Hematology

## 2019-10-14 VITALS — BP 147/75 | HR 73 | Temp 97.1°F | Resp 20 | Ht 59.0 in | Wt 142.7 lb

## 2019-10-14 DIAGNOSIS — M7989 Other specified soft tissue disorders: Secondary | ICD-10-CM | POA: Diagnosis not present

## 2019-10-14 DIAGNOSIS — Z79899 Other long term (current) drug therapy: Secondary | ICD-10-CM | POA: Insufficient documentation

## 2019-10-14 DIAGNOSIS — C3481 Malignant neoplasm of overlapping sites of right bronchus and lung: Secondary | ICD-10-CM | POA: Insufficient documentation

## 2019-10-14 DIAGNOSIS — Z7982 Long term (current) use of aspirin: Secondary | ICD-10-CM | POA: Insufficient documentation

## 2019-10-14 DIAGNOSIS — Z7952 Long term (current) use of systemic steroids: Secondary | ICD-10-CM | POA: Insufficient documentation

## 2019-10-14 DIAGNOSIS — C3491 Malignant neoplasm of unspecified part of right bronchus or lung: Secondary | ICD-10-CM

## 2019-10-14 DIAGNOSIS — D72829 Elevated white blood cell count, unspecified: Secondary | ICD-10-CM | POA: Insufficient documentation

## 2019-10-14 DIAGNOSIS — R609 Edema, unspecified: Secondary | ICD-10-CM

## 2019-10-14 DIAGNOSIS — T380X5A Adverse effect of glucocorticoids and synthetic analogues, initial encounter: Secondary | ICD-10-CM | POA: Insufficient documentation

## 2019-10-14 DIAGNOSIS — I6782 Cerebral ischemia: Secondary | ICD-10-CM | POA: Insufficient documentation

## 2019-10-14 DIAGNOSIS — I700 Atherosclerosis of aorta: Secondary | ICD-10-CM | POA: Diagnosis not present

## 2019-10-14 DIAGNOSIS — L539 Erythematous condition, unspecified: Secondary | ICD-10-CM | POA: Insufficient documentation

## 2019-10-14 DIAGNOSIS — J439 Emphysema, unspecified: Secondary | ICD-10-CM | POA: Insufficient documentation

## 2019-10-14 DIAGNOSIS — I2510 Atherosclerotic heart disease of native coronary artery without angina pectoris: Secondary | ICD-10-CM | POA: Insufficient documentation

## 2019-10-14 LAB — CBC WITH DIFFERENTIAL (CANCER CENTER ONLY)
Abs Immature Granulocytes: 0.29 10*3/uL — ABNORMAL HIGH (ref 0.00–0.07)
Basophils Absolute: 0 10*3/uL (ref 0.0–0.1)
Basophils Relative: 0 %
Eosinophils Absolute: 0 10*3/uL (ref 0.0–0.5)
Eosinophils Relative: 0 %
HCT: 45.7 % (ref 36.0–46.0)
Hemoglobin: 14.6 g/dL (ref 12.0–15.0)
Immature Granulocytes: 2 %
Lymphocytes Relative: 7 %
Lymphs Abs: 1.1 10*3/uL (ref 0.7–4.0)
MCH: 30.1 pg (ref 26.0–34.0)
MCHC: 31.9 g/dL (ref 30.0–36.0)
MCV: 94.2 fL (ref 80.0–100.0)
Monocytes Absolute: 0.7 10*3/uL (ref 0.1–1.0)
Monocytes Relative: 4 %
Neutro Abs: 13.7 10*3/uL — ABNORMAL HIGH (ref 1.7–7.7)
Neutrophils Relative %: 87 %
Platelet Count: 312 10*3/uL (ref 150–400)
RBC: 4.85 MIL/uL (ref 3.87–5.11)
RDW: 15.7 % — ABNORMAL HIGH (ref 11.5–15.5)
WBC Count: 15.9 10*3/uL — ABNORMAL HIGH (ref 4.0–10.5)
nRBC: 0 % (ref 0.0–0.2)

## 2019-10-14 LAB — CMP (CANCER CENTER ONLY)
ALT: 41 U/L (ref 0–44)
AST: 20 U/L (ref 15–41)
Albumin: 3.1 g/dL — ABNORMAL LOW (ref 3.5–5.0)
Alkaline Phosphatase: 108 U/L (ref 38–126)
Anion gap: 7 (ref 5–15)
BUN: 20 mg/dL (ref 8–23)
CO2: 26 mmol/L (ref 22–32)
Calcium: 8.3 mg/dL — ABNORMAL LOW (ref 8.9–10.3)
Chloride: 107 mmol/L (ref 98–111)
Creatinine: 1.22 mg/dL — ABNORMAL HIGH (ref 0.44–1.00)
GFR, Est AFR Am: 45 mL/min — ABNORMAL LOW (ref >60)
GFR, Estimated: 39 mL/min — ABNORMAL LOW (ref >60)
Glucose, Bld: 116 mg/dL — ABNORMAL HIGH (ref 70–99)
Potassium: 4.5 mmol/L (ref 3.5–5.1)
Sodium: 140 mmol/L (ref 135–145)
Total Bilirubin: 0.5 mg/dL (ref 0.3–1.2)
Total Protein: 6.3 g/dL — ABNORMAL LOW (ref 6.5–8.1)

## 2019-10-14 MED ORDER — FUROSEMIDE 40 MG PO TABS: 40.0000 mg | ORAL_TABLET | Freq: Every day | ORAL | 5 refills | Status: DC | PRN

## 2019-10-14 NOTE — Progress Notes (Signed)
[facility] OFFICE PROGRESS NOTE  Patient Care Team: Buzzy Han, MD as PCP - General (Family Medicine) Troy Sine, MD as PCP - Cardiology (Cardiology)  HEME/ONC OVERVIEW: 1. Stage IIIB [phone]) adenocarcinoma of the R lung, unresectable, MET exon 14 splice variant deletion+ -Late 05/2019:   A large R lung mass extending from the RUL to RLL with mediastinal invasion, R hilar denopathy on CT; no mets in abdomen  Questionable small foci in L frontal and R parietal lobes on MRI brain, limited due to motion degradation; repeat MRI still limited by   Bronch w/ R lung bx, c/w adenocarcinoma; MET exon 14 splice variant deletion+  Large FDG-avid R lung mass with extension to the R hilum and paratracheal LN involvement; no distant mets  -07/2019 - present: palliative capmatinib   09/2019: improving RUL and RLL malignancy and nodal metastasis; no new or metastatic disease   TREATMENT SUMMARY:  07/22/2019 - present: capmatinib 449m BID   ASSESSMENT & PLAN:   Stage IIIB (([id number] adenocarcinoma of the R lung, unresectable, MET exon 14 splice variant deletion+ -Patient is tolerating capmatinib relatively well, except moderate fluid retention (see the management of toxicity below) -Labs adequate today, continue capmatinib 4041mBID for now  -If fluid retention worsens despite steroid and diuretics, then we can hold treatment for one to two weeks, and once symptoms improve, we can consider resuming at a lower dose  -We will monitor her disease with q3m21monthT CAP (next due in 12/2019).    Of note, patient has a history of contrast allergy, and requires premedication with Benadryl and prednisone -Periodic TSH monitoring   Fluid retention -Secondary to capmatinib -Exam notable for 2+ bilateral lower extremity swelling extending to the knees and 1+ swelling in the dorsal surface of the hands; overall unchanged -Currently on dexamethasone 2mg20mily; no increase  in urine output with Lasix 20mg53mly -I have increased Lasix to 40mg 6my PRN; if she still does not see any increase in UOP, then we can increase to 60mg d18m PRN  -I counseled the patient on maintaining adequate oral intake, and avoid excess diuresis, which can cause hypotension   Leukocytosis -Secondary to steroid -WBC 15.9k today, stable -Patient denies any symptoms of infection -We will monitor it closely   Orders Placed This Encounter  Procedures  . TSH    Standing Status:   Future    Standing Expiration Date:   10/13/2020   All questions were answered. The patient knows to call the clinic with any problems, questions or concerns. No barriers to learning was detected.  Return in 1 month for labs and clinic follow-up.  Jeweline Reif ZhaTish Men3/20218:08 PM  CHIEF COMPLAINT: "I am feeling great"  INTERVAL HISTORY: Ms. [facility] clinic for follow-up of locally advanced adenocarcinoma of the lung on capmatinib.  Patient reports that she has been taking dexamethasone 2 mg daily instead of twice a day due to insomnia at night.  She was given a trial of Lasix 20 mg daily, but did not see any improvement in urine output.  She still has mild to moderate swelling in the bilateral lower and hands, but swallowing is overall stable.  She otherwise feels very well, and denies any chest pain, dyspnea, diarrhea, or rash.  She currently drinks 1 Boost a day in addition to normal intake.  REVIEW OF SYSTEMS:   Constitutional: ( - ) fevers, ( - )  chills , ( - ) night sweats Eyes: ( - )  blurriness of vision, ( - ) double vision, ( - ) watery eyes Ears, nose, mouth, throat, and face: ( - ) mucositis, ( - ) sore throat Respiratory: ( - ) cough, ( - ) dyspnea, ( - ) wheezes Cardiovascular: ( - ) palpitation, ( - ) chest discomfort, ( + ) lower extremity swelling Gastrointestinal:  ( - ) nausea, ( - ) heartburn, ( - ) change in bowel habits Skin: ( - ) abnormal skin rashes Lymphatics: ( - ) new  lymphadenopathy, ( - ) easy bruising Neurological: ( - ) numbness, ( - ) tingling, ( - ) new weaknesses Behavioral/Psych: ( - ) mood change, ( - ) new changes  All other systems were reviewed with the patient and are negative.  SUMMARY OF ONCOLOGIC HISTORY: Oncology History  Adenocarcinoma of right lung ([place])  06/08/2019 Imaging   CT chest: IMPRESSION: 1. Central RIGHT UPPER LOBE lung mass with mediastinal extension as described above and associated post-obstructive atelectasis and pneumonitis in the RIGHT UPPER LOBE, associated with necrosis. 2. Second mass is suspected in the superior segment LEFT LOWER LOBE with associated pneumonitis and necrosis. 3. RIGHT hilar lymphadenopathy, likely metastatic. 4. No evidence of metastatic disease elsewhere. 5. Approximate 1.2 cm RIGHT lobe thyroid nodule, not present on the 2008 CT. Thyroid ultrasound may be confirmatory.   06/09/2019 Initial Diagnosis   Lung cancer ([place])   06/09/2019 Imaging   MRI brain: IMPRESSION: 1. Severely motion degraded study, which limits the detection of metastatic lesions. The below findings are nonspecific due to the degree of artifact and repeating the examination should be considered when the patient can be made more comfortable and able to tolerate the length of the study. 2. Punctate focus of abnormal diffusion restriction within the anterior right parietal lobe may indicate a small area of acute ischemia. Small metastases can also cause diffusion restriction, but there is no corresponding contrast enhancement visible. 3. 2 small foci of contrast enhancement within the left frontal lobe and right parietal lobe. These may be vascular, though small metastases could have this appearance.IMPRESSION: 1. Severely motion degraded study, which limits the detection of metastatic lesions. The below findings are nonspecific due to the degree of artifact and repeating the examination should be considered when the  patient can be made more comfortable and able to tolerate the length of the study. 2. Punctate focus of abnormal diffusion restriction within the anterior right parietal lobe may indicate a small area of acute ischemia. Small metastases can also cause diffusion restriction, but there is no corresponding contrast enhancement visible. 3. 2 small foci of contrast enhancement within the left frontal lobe and right parietal lobe. These may be vascular, though small metastases could have this appearance.   06/09/2019 Imaging   CT abdomen/pelvis: IMPRESSION: 1. No CT findings for metastatic disease involving the abdomen/pelvis or bony structures. 2. Status post cholecystectomy with associated mild biliary dilatation. 3. Advanced atherosclerotic calcifications involving the aorta and iliac arteries but no aneurysm or dissection.   06/26/2019 Imaging   PET:  IMPRESSION: 1. Hypermetabolic right upper/right lower lobe thick-walled cavitary mass with a hypermetabolic ipsilateral low right paratracheal lymph node, findings most consistent with primary bronchogenic carcinoma (T4 N2 M0 or stage IIIB disease). 2. Aortic atherosclerosis (ICD10-170.0). Coronary artery calcification. 3.  Emphysema (ICD10-J43.9).   06/26/2019 Imaging   MRI brain w/o contrast: IMPRESSION: The patient was able to hold still for the unenhanced images however was moving during the postcontrast on the images, limiting evaluation for metastatic  disease   No definite metastatic deposits identified   Atrophy and chronic microvascular ischemic change in the white matter.   07/22/2019 Cancer Staging   Staging form: Lung, AJCC 8th Edition - Clinical: Stage IIIB (cT4, cN2, cM0) - Signed by Tish Men, MD on 07/22/2019   09/14/2019 Imaging   CT chest, abdomen and pelvis:   IMPRESSION: Mass-like cavitary lesion in the right upper and lower lobes, corresponding to the patient's known primary bronchogenic  neoplasm, improved. Decreased soft tissue component.   Improving right suprahilar nodal metastasis.   No evidence of metastatic disease in the abdomen/pelvis.   Aortic Atherosclerosis (ICD10-I70.0) and Emphysema (ICD10-J43.9).     I have reviewed the past medical history, past surgical history, social history and family history with the patient and they are unchanged from previous note.  ALLERGIES:  is allergic to acetaminophen; benadryl [diphenhydramine]; zetia [ezetimibe]; gabapentin; hydrochlorothiazide; ace inhibitors; codeine; erythromycin; etodolac; and ivp dye [iodinated diagnostic agents].  MEDICATIONS:  Current Outpatient Medications  Medication Sig Dispense Refill  . acetaminophen (TYLENOL) 325 MG tablet Take 650 mg by mouth every 6 (six) hours as needed for mild pain or headache.    Marland Kitchen amLODipine (NORVASC) 5 MG tablet Take 5 mg by mouth daily.    Marland Kitchen aspirin 81 MG tablet Take 81 mg by mouth daily.    . Calcium 1500 MG tablet Take 1,500 mg by mouth daily.    . capmatinib (TABRECTA) 200 MG tablet Take 2 tablets (400 mg total) by mouth 2 (two) times daily. 112 tablet 11  . cholecalciferol (VITAMIN D) 1000 UNITS tablet Take 1,000 Units by mouth daily. Take 1 tab daily    . dexamethasone (DECADRON) 4 MG tablet Take half a tab twice a day for one week, and if well tolerated, increase to 1 tab twice a day. Take with food. 60 tablet 3  . furosemide (LASIX) 40 MG tablet Take 1 tablet (40 mg total) by mouth daily as needed. 30 tablet 5  . NON FORMULARY at bedtime. BiPAP    . Omega-3 Fatty Acids (FISH OIL) 1000 MG CAPS Take 1,000 mg by mouth daily.     . ondansetron (ZOFRAN) 8 MG tablet Take 1 tablet (8 mg total) by mouth every 8 (eight) hours as needed for nausea or vomiting. 30 tablet 2  . polyvinyl alcohol (LIQUIFILM TEARS) 1.4 % ophthalmic solution Place 1 drop into both eyes as needed for dry eyes.    Marland Kitchen prochlorperazine (COMPAZINE) 10 MG tablet Take 1 tablet (10 mg total) by mouth  every 6 (six) hours as needed for nausea or vomiting. 30 tablet 3  . RESTASIS 0.05 % ophthalmic emulsion Place 1 drop into both eyes 2 (two) times daily. Use 1 drop in each eye twice a day    . rosuvastatin (CRESTOR) 5 MG tablet Take 1 tablet (5 mg total) by mouth every other day. 45 tablet 2   No current facility-administered medications for this visit.    PHYSICAL EXAMINATION: ECOG PERFORMANCE STATUS: 2 - Symptomatic, <50% confined to bed  Today's Vitals   10/14/19 1549 10/14/19 1555  BP: (!) 147/75   Pulse: 73   Resp: 20   Temp: (!) 97.1 F (36.2 C)   TempSrc: Temporal   SpO2: 100%   Weight: 142 lb 11.2 oz (64.7 kg)   Height: _0  (1.499 m)   PainSc:  0-No pain   Body mass index is 28.82 kg/m.  Filed Weights   10/14/19 1549  Weight: 142 lb 11.2  oz (64.7 kg)    GENERAL: alert, no distress and comfortable SKIN: skin color, texture, turgor are normal, no rashes or significant lesions EYES: conjunctiva are pink and non-injected, sclera clear OROPHARYNX: no exudate, no erythema; lips, buccal mucosa, and tongue normal  NECK: supple, non-tender LUNGS: clear to auscultation with normal breathing effort HEART: regular rate & rhythm and no murmurs and 2+ lower extremity edema to the knees, 1+ swelling in bilateral hands  ABDOMEN: soft, non-tender, non-distended, normal bowel sounds Musculoskeletal: no cyanosis of digits and no clubbing  PSYCH: alert & oriented x 3, fluent speech  LABORATORY DATA:  I have reviewed the data as listed    Component Value Date/Time   NA 140 10/14/2019 1518   NA 140 09/22/2018 1102   K 4.5 10/14/2019 1518   CL 107 10/14/2019 1518   CO2 26 10/14/2019 1518   GLUCOSE 116 (H) 10/14/2019 1518   BUN 20 10/14/2019 1518   BUN 15 09/22/2018 1102   CREATININE 1.22 (H) 10/14/2019 1518   CREATININE 1.35 (H) 01/16/2017 0817   CALCIUM 8.3 (L) 10/14/2019 1518   PROT 6.3 (L) 10/14/2019 1518   PROT 6.8 09/22/2018 1102   ALBUMIN 3.1 (L) 10/14/2019 1518    ALBUMIN 3.9 09/22/2018 1102   AST 20 10/14/2019 1518   ALT 41 10/14/2019 1518   ALKPHOS 108 10/14/2019 1518   BILITOT 0.5 10/14/2019 1518   GFRNONAA 39 (L) 10/14/2019 1518   GFRAA 45 (L) 10/14/2019 1518    No results found for: SPEP, UPEP  Lab Results  Component Value Date   WBC 15.9 (H) 10/14/2019   NEUTROABS 13.7 (H) 10/14/2019   HGB 14.6 10/14/2019   HCT 45.7 10/14/2019   MCV 94.2 10/14/2019   PLT 312 10/14/2019      Chemistry      Component Value Date/Time   NA 140 10/14/2019 1518   NA 140 09/22/2018 1102   K 4.5 10/14/2019 1518   CL 107 10/14/2019 1518   CO2 26 10/14/2019 1518   BUN 20 10/14/2019 1518   BUN 15 09/22/2018 1102   CREATININE 1.22 (H) 10/14/2019 1518   CREATININE 1.35 (H) 01/16/2017 0817      Component Value Date/Time   CALCIUM 8.3 (L) 10/14/2019 1518   ALKPHOS 108 10/14/2019 1518   AST 20 10/14/2019 1518   ALT 41 10/14/2019 1518   BILITOT 0.5 10/14/2019 1518       RADIOGRAPHIC STUDIES: I have personally reviewed the radiological images as listed below and agreed with the findings in the report. CT Head Wo Contrast  Result Date: 09/16/2019 CLINICAL DATA:  Weakness, right-sided non-small cell lung cancer, dizziness EXAM: CT HEAD WITHOUT CONTRAST TECHNIQUE: Contiguous axial images were obtained from the base of the skull through the vertex without intravenous contrast. COMPARISON:  07/26/2017. 06/26/2019 FINDINGS: Brain: No acute infarct or hemorrhage. Chronic small vessel ischemic changes are seen within the right external capsule and right frontal periventricular white matter. Lateral ventricles and remaining midline structures are unremarkable. No acute extra-axial fluid collections. No mass effect. Vascular: No hyperdense vessel or unexpected calcification. Skull: Normal. Negative for fracture or focal lesion. Sinuses/Orbits: No acute finding. Other: None IMPRESSION: 1. Stable head CT, no acute intracranial process. Electronically Signed   By:  Randa Ngo M.D.   On: 09/16/2019 13:38   DG Chest Port 1 View  Result Date: 09/16/2019 CLINICAL DATA:  Weakness. Lung cancer. EXAM: PORTABLE CHEST 1 VIEW COMPARISON:  Chest x-ray dated 06/08/2019 and CT scan of the  chest dated 09/14/2019 and PET-CT dated 06/26/2019 FINDINGS: There are areas of parenchymal scarring at the right lung apex as well as in the superior segment of the right lower lobe as demonstrated on the recent CT scan of the chest. The areas of tumor in the right upper lobe and in the right hilum and right lower lobe superior segment have markedly improved. The left lung remains clear. Heart size and vascularity are normal. No effusions. No acute bone abnormality. IMPRESSION: Marked improvement in the areas of tumor in the right upper lobe and right hilum and right lower lobe as compared to the prior chest x-ray of 06/08/2019. No new abnormalities. Electronically Signed   By: Lorriane Shire M.D.   On: 09/16/2019 12:16

## 2019-10-20 ENCOUNTER — Telehealth: Payer: Self-pay | Admitting: Hematology

## 2019-10-20 NOTE — Telephone Encounter (Signed)
I talk with patient regarding schedule  

## 2019-10-27 ENCOUNTER — Telehealth: Payer: Self-pay

## 2019-10-27 NOTE — Telephone Encounter (Signed)
Called patient and informed her of information below. Patient verbalized understanding and agreed to call office by Friday if symptoms do not improve.

## 2019-10-27 NOTE — Telephone Encounter (Signed)
-----   Message from Tish Men, MD sent at 10/27/2019 11:45 AM EDT ----- Regarding: RE: Patient Concern Please instruct the patient to hold capmatinib for 5 days, and continue low dose dexamethasone.  She can increase Lasix to 80mg  daily PRN to see if her swelling improves. Please have her call us back by Friday if her symptoms do not improve, and we can get her in the symptom management clinic.  Thanks.  [place]  ----- Message ----- From: Teodoro Spray, RN Sent: 10/27/2019  11:31 AM EDT To: Tish Men, MD Subject: Patient Concern                                Patient called office stating she is still experiencing swelling in bilateral legs and hands, and now also in her face. Denies SOB or difficulty swallowing. Patient states swelling is the same as when she was last seen in office on 10/14/19. Patient has been taking 40 mg lasix daily and wants to know if she should increase her dose. Patient also wants to know if she should continue to take the decadron.  Please advise.  Thank you.

## 2019-10-28 ENCOUNTER — Ambulatory Visit: Payer: Medicare HMO | Attending: Internal Medicine

## 2019-10-28 DIAGNOSIS — Z23 Encounter for immunization: Secondary | ICD-10-CM

## 2019-10-28 NOTE — Progress Notes (Signed)
   Covid-19 Vaccination Clinic  Name:  Jo Hendrix    MRN: [id number] DOB: 31-Dec-1926  10/28/2019  [name] was observed post Covid-19 immunization for 15 minutes without incident. She was provided with Vaccine Information Sheet and instruction to access the V-Safe system.   [name] was instructed to call 911 with any severe reactions post vaccine: Marland Kitchen Difficulty breathing  . Swelling of face and throat  . A fast heartbeat  . A bad rash all over body  . Dizziness and weakness   Immunizations Administered    Name Date Dose VIS Date Route   Pfizer COVID-19 Vaccine 10/28/2019  1:21 PM 0.3 mL 07/24/2019 Intramuscular   Manufacturer: [place]   Lot: [id number]   [place]: [id number]

## 2019-11-02 ENCOUNTER — Telehealth: Payer: Self-pay

## 2019-11-02 NOTE — Telephone Encounter (Signed)
-----   Message from Tish Men, MD sent at 11/02/2019  8:39 AM EDT ----- If she is not experiening any symptoms of dehydration, such as dizziness or lightheadedness, I would take daily lasix. I would continue to hold the capmatinib for now.  Can you set her up for a lab and clinic appt to see me this Wednesday? Okay to use the 9AM for clinic slot, and labs prior to the clinic appt.  Thanks.  [place]  ----- Message ----- From: Tami Lin, RN Sent: 10/30/2019   1:22 PM EDT To: Tish Men, MD  Hello, Patient called and wants to know if she should continue to take Lasix 80 mg daily. She said the swelling in her legs has decreased but her face is still puffy. She also wants to know if she should continue to hold Tabrecta 200 mg. Thanks, Emeterio Reeve

## 2019-11-02 NOTE — Telephone Encounter (Signed)
Called patient and made her aware of Dr. Lorette Ang instructions and she verbalized understanding. Patient is aware of lab appointment at 8:30 on Wednesday 3/24 followed by a visit with Dr. Maylon Peppers. Scheduling message sent.

## 2019-11-04 ENCOUNTER — Inpatient Hospital Stay: Payer: Medicare HMO

## 2019-11-04 ENCOUNTER — Encounter: Payer: Self-pay | Admitting: Hematology

## 2019-11-04 ENCOUNTER — Other Ambulatory Visit: Payer: Self-pay

## 2019-11-04 ENCOUNTER — Inpatient Hospital Stay (HOSPITAL_BASED_OUTPATIENT_CLINIC_OR_DEPARTMENT_OTHER): Payer: Medicare HMO | Admitting: Hematology

## 2019-11-04 VITALS — BP 131/97 | HR 90 | Temp 97.4°F | Resp 18 | Ht 59.0 in | Wt 140.2 lb

## 2019-11-04 DIAGNOSIS — L708 Other acne: Secondary | ICD-10-CM

## 2019-11-04 DIAGNOSIS — C3491 Malignant neoplasm of unspecified part of right bronchus or lung: Secondary | ICD-10-CM

## 2019-11-04 DIAGNOSIS — E876 Hypokalemia: Secondary | ICD-10-CM | POA: Diagnosis not present

## 2019-11-04 DIAGNOSIS — D72829 Elevated white blood cell count, unspecified: Secondary | ICD-10-CM

## 2019-11-04 DIAGNOSIS — R609 Edema, unspecified: Secondary | ICD-10-CM | POA: Diagnosis not present

## 2019-11-04 DIAGNOSIS — C3481 Malignant neoplasm of overlapping sites of right bronchus and lung: Secondary | ICD-10-CM | POA: Diagnosis not present

## 2019-11-04 LAB — CBC WITH DIFFERENTIAL (CANCER CENTER ONLY)
Abs Immature Granulocytes: 0.34 10*3/uL — ABNORMAL HIGH (ref 0.00–0.07)
Basophils Absolute: 0.1 10*3/uL (ref 0.0–0.1)
Basophils Relative: 1 %
Eosinophils Absolute: 0.2 10*3/uL (ref 0.0–0.5)
Eosinophils Relative: 1 %
HCT: 42.1 % (ref 36.0–46.0)
Hemoglobin: 13.8 g/dL (ref 12.0–15.0)
Immature Granulocytes: 3 %
Lymphocytes Relative: 24 %
Lymphs Abs: 3.4 10*3/uL (ref 0.7–4.0)
MCH: 29.7 pg (ref 26.0–34.0)
MCHC: 32.8 g/dL (ref 30.0–36.0)
MCV: 90.7 fL (ref 80.0–100.0)
Monocytes Absolute: 1 10*3/uL (ref 0.1–1.0)
Monocytes Relative: 7 %
Neutro Abs: 8.9 10*3/uL — ABNORMAL HIGH (ref 1.7–7.7)
Neutrophils Relative %: 64 %
Platelet Count: 297 10*3/uL (ref 150–400)
RBC: 4.64 MIL/uL (ref 3.87–5.11)
RDW: 14.4 % (ref 11.5–15.5)
WBC Count: 13.8 10*3/uL — ABNORMAL HIGH (ref 4.0–10.5)
nRBC: 0 % (ref 0.0–0.2)

## 2019-11-04 LAB — CMP (CANCER CENTER ONLY)
ALT: 27 U/L (ref 0–44)
AST: 18 U/L (ref 15–41)
Albumin: 3 g/dL — ABNORMAL LOW (ref 3.5–5.0)
Alkaline Phosphatase: 83 U/L (ref 38–126)
Anion gap: 12 (ref 5–15)
BUN: 24 mg/dL — ABNORMAL HIGH (ref 8–23)
CO2: 30 mmol/L (ref 22–32)
Calcium: 9 mg/dL (ref 8.9–10.3)
Chloride: 100 mmol/L (ref 98–111)
Creatinine: 1.14 mg/dL — ABNORMAL HIGH (ref 0.44–1.00)
GFR, Est AFR Am: 49 mL/min — ABNORMAL LOW (ref >60)
GFR, Estimated: 42 mL/min — ABNORMAL LOW (ref >60)
Glucose, Bld: 81 mg/dL (ref 70–99)
Potassium: 3.4 mmol/L — ABNORMAL LOW (ref 3.5–5.1)
Sodium: 142 mmol/L (ref 135–145)
Total Bilirubin: 0.4 mg/dL (ref 0.3–1.2)
Total Protein: 6.7 g/dL (ref 6.5–8.1)

## 2019-11-04 LAB — TSH: TSH: 1.131 u[IU]/mL (ref 0.308–3.960)

## 2019-11-04 MED ORDER — CAPMATINIB HCL 150 MG PO TABS: 300.0000 mg | ORAL_TABLET | Freq: Two times a day (BID) | ORAL | 11 refills | Status: AC

## 2019-11-04 MED ORDER — FLUTICASONE PROPIONATE 0.05 % EX CREA: TOPICAL_CREAM | Freq: Two times a day (BID) | CUTANEOUS | 4 refills | Status: DC

## 2019-11-04 MED ORDER — DOXYCYCLINE HYCLATE 100 MG PO TABS: 100.0000 mg | ORAL_TABLET | Freq: Two times a day (BID) | ORAL | 0 refills | Status: AC

## 2019-11-04 MED ORDER — POTASSIUM CHLORIDE CRYS ER 20 MEQ PO TBCR: 20.0000 meq | EXTENDED_RELEASE_TABLET | Freq: Every day | ORAL | 5 refills | Status: DC

## 2019-11-04 NOTE — Progress Notes (Signed)
[place] OFFICE PROGRESS NOTE  Patient Care Team: Buzzy Han, MD as PCP - General (Family Medicine) Troy Sine, MD as PCP - Cardiology (Cardiology)  HEME/ONC OVERVIEW: 1. 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) adenocarcinoma of the R lung, unresectable, MET exon 14 splice variant deletion+ -Late 05/2019:   A large R lung mass extending from the RUL to RLL with mediastinal invasion, R hilar denopathy on CT; no mets in abdomen  Questionable small foci in L frontal and R parietal lobes on MRI brain, limited due to motion degradation; repeat MRI still limited by   Bronch w/ R lung bx, c/w adenocarcinoma; MET exon 14 splice variant deletion+  Large FDG-avid R lung mass with extension to the R hilum and paratracheal LN involvement; no distant mets  -07/2019 - present: palliative capmatinib   09/2019: improving RUL and RLL malignancy and nodal metastasis; no new or metastatic disease   TREATMENT SUMMARY:  07/22/2019 - present: capmatinib 4102m BID   ASSESSMENT & PLAN:   Stage IIIB (([id number] adenocarcinoma of the R lung, unresectable, MET exon 14 splice variant deletion+ -On capmatinib since 07/2019; treatment relatively well tolerated, except fluid retention (see the management of toxicity below) -Treatment on hold since 10/27/2019 due to persistent fluid retention  -Due to the side effects of the medication, I will reduce the capmatinib dose to 303mBID  -We will monitor Jo Hendrix disease with q3m21monthT CAP (next due in 12/2019).    Of note, patient has a history of contrast allergy, and requires premedication with Benadryl and prednisone -Periodic TSH monitoring   Fluid retention -Secondary to capmatinib -Exam notable for 1-2+ bilateral lower extremity swelling extending to the knees; overall improving  -Currently on dexamethasone 2mg26mily and Lasix 80mg82m  -As Jo Hendrix has been on dexamethasone for several weeks, and Jo Hendrix swelling is improving, I will taper Jo Hendrix  dexamethasone off over the next 2 weeks (2mg q41mys x 1 week, then q3days x 1 week, then stop) -Continue Lasix 80mg d90m PRN   Acneiform rash -Secondary to capmatinib -Erythematous rash over the cheeks and less over the scalp, Grade 1  -I have prescribed doxycycline x 1 week, and topical fluticasone cream BID (avoid the eyes)  Leukocytosis -Secondary to steroid -WBC 13.8k today, stable -Patient denies any symptoms of infection -We will monitor it closely   Hypokalemia -Secondary to diuretics -K 3.4 today, mildly low -As Jo Hendrix will likely be on diuretics for some time, I have prescribed KCl 20mEq d20m to prevent worsening hypokalemia  No orders of the defined types were placed in this encounter.  The total time spent in the encounter was 45 minutes, including face-to-face time with the patient, review of various tests results, order additional studies/medications, documentation, and coordination of care plan.   All questions were answered. The patient knows to call the clinic with any problems, questions or concerns. No barriers to learning was detected.  Return in 2 weeks for labs and clinic follow-up.   Rishan Oyama ZhaoTish Men4/202110:11 AM  CHIEF COMPLAINT: "My swelling is much better"  INTERVAL HISTORY: [facility] for follow-up of locally advanced adenocarcinoma of the right lung on capmatinib.  Patient reports that Jo Hendrix swelling in the upper extremity and face has mostly resolved, and Jo Hendrix swelling in the lower extremities has improved significantly.  Jo Hendrix currently takes dexamethasone 2 mg daily and Lasix 80 mg daily as needed.  Jo Hendrix swelling in the lower extremity seems to be worse at the end of the day.  Jo Hendrix also has noticed some mild erythematous rash over Jo Hendrix cheeks and less on Jo Hendrix scalp, nonpruritic, nonraised, without any purulent drainage.  Jo Hendrix capmatinib has been on hold since 10/27/2019 due to lower extremity swelling.  Jo Hendrix energy level is improving.  Jo Hendrix denies any  other complaint today.  REVIEW OF SYSTEMS:   Constitutional: ( - ) fevers, ( - )  chills , ( - ) night sweats Eyes: ( - ) blurriness of vision, ( - ) double vision, ( - ) watery eyes Ears, nose, mouth, throat, and face: ( - ) mucositis, ( - ) sore throat Respiratory: ( - ) cough, ( - ) dyspnea, ( - ) wheezes Cardiovascular: ( - ) palpitation, ( - ) chest discomfort, ( + ) lower extremity swelling Gastrointestinal:  ( - ) nausea, ( - ) heartburn, ( - ) change in bowel habits Skin: ( + ) abnormal skin rashes Lymphatics: ( - ) new lymphadenopathy, ( - ) easy bruising Neurological: ( - ) numbness, ( - ) tingling, ( - ) new weaknesses Behavioral/Psych: ( - ) mood change, ( - ) new changes  All other systems were reviewed with the patient and are negative.  SUMMARY OF ONCOLOGIC HISTORY: Oncology History  Adenocarcinoma of right lung ([place])  06/08/2019 Imaging   CT chest: IMPRESSION: 1. Central RIGHT UPPER LOBE lung mass with mediastinal extension as described above and associated post-obstructive atelectasis and pneumonitis in the RIGHT UPPER LOBE, associated with necrosis. 2. Second mass is suspected in the superior segment LEFT LOWER LOBE with associated pneumonitis and necrosis. 3. RIGHT hilar lymphadenopathy, likely metastatic. 4. No evidence of metastatic disease elsewhere. 5. Approximate 1.2 cm RIGHT lobe thyroid nodule, not present on the 2008 CT. Thyroid ultrasound may be confirmatory.   06/09/2019 Initial Diagnosis   Lung cancer ([place])   06/09/2019 Imaging   MRI brain: IMPRESSION: 1. Severely motion degraded study, which limits the detection of metastatic lesions. The below findings are nonspecific due to the degree of artifact and repeating the examination should be considered when the patient can be made more comfortable and able to tolerate the length of the study. 2. Punctate focus of abnormal diffusion restriction within the anterior right parietal lobe may indicate a  small area of acute ischemia. Small metastases can also cause diffusion restriction, but there is no corresponding contrast enhancement visible. 3. 2 small foci of contrast enhancement within the left frontal lobe and right parietal lobe. These may be vascular, though small metastases could have this appearance.IMPRESSION: 1. Severely motion degraded study, which limits the detection of metastatic lesions. The below findings are nonspecific due to the degree of artifact and repeating the examination should be considered when the patient can be made more comfortable and able to tolerate the length of the study. 2. Punctate focus of abnormal diffusion restriction within the anterior right parietal lobe may indicate a small area of acute ischemia. Small metastases can also cause diffusion restriction, but there is no corresponding contrast enhancement visible. 3. 2 small foci of contrast enhancement within the left frontal lobe and right parietal lobe. These may be vascular, though small metastases could have this appearance.   06/09/2019 Imaging   CT abdomen/pelvis: IMPRESSION: 1. No CT findings for metastatic disease involving the abdomen/pelvis or bony structures. 2. Status post cholecystectomy with associated mild biliary dilatation. 3. Advanced atherosclerotic calcifications involving the aorta and iliac arteries but no aneurysm or dissection.   06/26/2019 Imaging   PET:  IMPRESSION: 1. Hypermetabolic right  upper/right lower lobe thick-walled cavitary mass with a hypermetabolic ipsilateral low right paratracheal lymph node, findings most consistent with primary bronchogenic carcinoma (T4 N2 M0 or stage IIIB disease). 2. Aortic atherosclerosis (ICD10-170.0). Coronary artery calcification. 3.  Emphysema (ICD10-J43.9).   06/26/2019 Imaging   MRI brain w/o contrast: IMPRESSION: The patient was able to hold still for the unenhanced images however was moving during the  postcontrast on the images, limiting evaluation for metastatic disease   No definite metastatic deposits identified   Atrophy and chronic microvascular ischemic change in the white matter.   07/22/2019 Cancer Staging   Staging form: Lung, AJCC 8th Edition - Clinical: Stage IIIB (cT4, cN2, cM0) - Signed by Tish Men, MD on 07/22/2019   09/14/2019 Imaging   CT chest, abdomen and pelvis:   IMPRESSION: Mass-like cavitary lesion in the right upper and lower lobes, corresponding to the patient's known primary bronchogenic neoplasm, improved. Decreased soft tissue component.   Improving right suprahilar nodal metastasis.   No evidence of metastatic disease in the abdomen/pelvis.   Aortic Atherosclerosis (ICD10-I70.0) and Emphysema (ICD10-J43.9).     I have reviewed the past medical history, past surgical history, social history and family history with the patient and they are unchanged from previous note.  ALLERGIES:  is allergic to acetaminophen; benadryl [diphenhydramine]; zetia [ezetimibe]; gabapentin; hydrochlorothiazide; ace inhibitors; codeine; erythromycin; etodolac; and ivp dye [iodinated diagnostic agents].  MEDICATIONS:  Current Outpatient Medications  Medication Sig Dispense Refill  . acetaminophen (TYLENOL) 325 MG tablet Take 650 mg by mouth every 6 (six) hours as needed for mild pain or headache.    Marland Kitchen amLODipine (NORVASC) 5 MG tablet Take 5 mg by mouth daily.    Marland Kitchen aspirin 81 MG tablet Take 81 mg by mouth daily.    . Calcium 1500 MG tablet Take 1,500 mg by mouth daily.    . capmatinib (TABRECTA) 150 MG tablet Take 2 tablets (300 mg total) by mouth 2 (two) times daily. 120 tablet 11  . cholecalciferol (VITAMIN D) 1000 UNITS tablet Take 1,000 Units by mouth daily. Take 1 tab daily    . dexamethasone (DECADRON) 4 MG tablet Take half a tab twice a day for one week, and if well tolerated, increase to 1 tab twice a day. Take with food. 60 tablet 3  . doxycycline (VIBRA-TABS) 100  MG tablet Take 1 tablet (100 mg total) by mouth 2 (two) times daily for 7 days. 14 tablet 0  . fluticasone (CUTIVATE) 0.05 % cream Apply topically 2 (two) times daily. 30 g 4  . furosemide (LASIX) 40 MG tablet Take 1 tablet (40 mg total) by mouth daily as needed. 30 tablet 5  . NON FORMULARY at bedtime. BiPAP    . Omega-3 Fatty Acids (FISH OIL) 1000 MG CAPS Take 1,000 mg by mouth daily.     . ondansetron (ZOFRAN) 8 MG tablet Take 1 tablet (8 mg total) by mouth every 8 (eight) hours as needed for nausea or vomiting. 30 tablet 2  . polyvinyl alcohol (LIQUIFILM TEARS) 1.4 % ophthalmic solution Place 1 drop into both eyes as needed for dry eyes.    . potassium chloride SA (KLOR-CON) 20 MEQ tablet Take 1 tablet (20 mEq total) by mouth daily. 30 tablet 5  . prochlorperazine (COMPAZINE) 10 MG tablet Take 1 tablet (10 mg total) by mouth every 6 (six) hours as needed for nausea or vomiting. 30 tablet 3  . RESTASIS 0.05 % ophthalmic emulsion Place 1 drop into both eyes 2 (two)  times daily. Use 1 drop in each eye twice a day    . rosuvastatin (CRESTOR) 5 MG tablet Take 1 tablet (5 mg total) by mouth every other day. 45 tablet 2   No current facility-administered medications for this visit.    PHYSICAL EXAMINATION: ECOG PERFORMANCE STATUS: 2 - Symptomatic, <50% confined to bed  Today's Vitals   11/04/19 0907 11/04/19 0908  BP: (!) 131/97   Pulse: 90   Resp: 18   Temp: (!) 97.4 F (36.3 C)   TempSrc: Temporal   SpO2: 97%   Weight: 140 lb 3.2 oz (63.6 kg)   Height: _0  (1.499 m)   PainSc:  0-No pain   Body mass index is 28.32 kg/m.  Filed Weights   11/04/19 0907  Weight: 140 lb 3.2 oz (63.6 kg)    GENERAL: alert, no distress and comfortable SKIN: slight erythematous patches over the bilateral cheeks and less on Jo Hendrix scalp, non-pruritic, no drainage  EYES: conjunctiva are pink and non-injected, sclera clear OROPHARYNX: no exudate, no erythema; lips, buccal mucosa, and tongue normal   NECK: supple, non-tender LUNGS: clear to auscultation with normal breathing effort HEART: regular rate & rhythm and no murmurs and 1-2+ bilateral lower extremity edema ABDOMEN: soft, non-tender, non-distended, normal bowel sounds Musculoskeletal: no cyanosis of digits and no clubbing  PSYCH: alert & oriented x 3, fluent speech  LABORATORY DATA:  I have reviewed the data as listed    Component Value Date/Time   NA 142 11/04/2019 0836   NA 140 09/22/2018 1102   K 3.4 (L) 11/04/2019 0836   CL 100 11/04/2019 0836   CO2 30 11/04/2019 0836   GLUCOSE 81 11/04/2019 0836   BUN 24 (H) 11/04/2019 0836   BUN 15 09/22/2018 1102   CREATININE 1.14 (H) 11/04/2019 0836   CREATININE 1.35 (H) 01/16/2017 0817   CALCIUM 9.0 11/04/2019 0836   PROT 6.7 11/04/2019 0836   PROT 6.8 09/22/2018 1102   ALBUMIN 3.0 (L) 11/04/2019 0836   ALBUMIN 3.9 09/22/2018 1102   AST 18 11/04/2019 0836   ALT 27 11/04/2019 0836   ALKPHOS 83 11/04/2019 0836   BILITOT 0.4 11/04/2019 0836   GFRNONAA 42 (L) 11/04/2019 0836   GFRAA 49 (L) 11/04/2019 0836    No results found for: SPEP, UPEP  Lab Results  Component Value Date   WBC 13.8 (H) 11/04/2019   NEUTROABS 8.9 (H) 11/04/2019   HGB 13.8 11/04/2019   HCT 42.1 11/04/2019   MCV 90.7 11/04/2019   PLT 297 11/04/2019      Chemistry      Component Value Date/Time   NA 142 11/04/2019 0836   NA 140 09/22/2018 1102   K 3.4 (L) 11/04/2019 0836   CL 100 11/04/2019 0836   CO2 30 11/04/2019 0836   BUN 24 (H) 11/04/2019 0836   BUN 15 09/22/2018 1102   CREATININE 1.14 (H) 11/04/2019 0836   CREATININE 1.35 (H) 01/16/2017 0817      Component Value Date/Time   CALCIUM 9.0 11/04/2019 0836   ALKPHOS 83 11/04/2019 0836   AST 18 11/04/2019 0836   ALT 27 11/04/2019 0836   BILITOT 0.4 11/04/2019 0836       RADIOGRAPHIC STUDIES: I have personally reviewed the radiological images as listed below and agreed with the findings in the report. No results found.

## 2019-11-25 ENCOUNTER — Telehealth: Payer: Self-pay | Admitting: Hematology

## 2019-11-25 ENCOUNTER — Inpatient Hospital Stay: Payer: Medicare HMO

## 2019-11-25 ENCOUNTER — Other Ambulatory Visit: Payer: Self-pay

## 2019-11-25 ENCOUNTER — Inpatient Hospital Stay: Payer: Medicare HMO | Attending: Hematology | Admitting: Hematology

## 2019-11-25 ENCOUNTER — Encounter: Payer: Self-pay | Admitting: Hematology

## 2019-11-25 VITALS — BP 130/82 | HR 90 | Temp 98.7°F | Resp 18 | Ht 59.0 in | Wt 140.0 lb

## 2019-11-25 DIAGNOSIS — Z7982 Long term (current) use of aspirin: Secondary | ICD-10-CM | POA: Diagnosis not present

## 2019-11-25 DIAGNOSIS — J439 Emphysema, unspecified: Secondary | ICD-10-CM | POA: Diagnosis not present

## 2019-11-25 DIAGNOSIS — I10 Essential (primary) hypertension: Secondary | ICD-10-CM | POA: Insufficient documentation

## 2019-11-25 DIAGNOSIS — D72828 Other elevated white blood cell count: Secondary | ICD-10-CM | POA: Insufficient documentation

## 2019-11-25 DIAGNOSIS — C3491 Malignant neoplasm of unspecified part of right bronchus or lung: Secondary | ICD-10-CM

## 2019-11-25 DIAGNOSIS — R609 Edema, unspecified: Secondary | ICD-10-CM | POA: Diagnosis not present

## 2019-11-25 DIAGNOSIS — G939 Disorder of brain, unspecified: Secondary | ICD-10-CM | POA: Diagnosis not present

## 2019-11-25 DIAGNOSIS — T380X5A Adverse effect of glucocorticoids and synthetic analogues, initial encounter: Secondary | ICD-10-CM | POA: Diagnosis not present

## 2019-11-25 DIAGNOSIS — C3481 Malignant neoplasm of overlapping sites of right bronchus and lung: Secondary | ICD-10-CM | POA: Diagnosis not present

## 2019-11-25 DIAGNOSIS — N179 Acute kidney failure, unspecified: Secondary | ICD-10-CM | POA: Diagnosis not present

## 2019-11-25 DIAGNOSIS — C779 Secondary and unspecified malignant neoplasm of lymph node, unspecified: Secondary | ICD-10-CM | POA: Diagnosis not present

## 2019-11-25 DIAGNOSIS — Z79899 Other long term (current) drug therapy: Secondary | ICD-10-CM | POA: Diagnosis not present

## 2019-11-25 DIAGNOSIS — D72829 Elevated white blood cell count, unspecified: Secondary | ICD-10-CM

## 2019-11-25 DIAGNOSIS — I700 Atherosclerosis of aorta: Secondary | ICD-10-CM | POA: Insufficient documentation

## 2019-11-25 LAB — CMP (CANCER CENTER ONLY)
ALT: 31 U/L (ref 0–44)
AST: 22 U/L (ref 15–41)
Albumin: 3 g/dL — ABNORMAL LOW (ref 3.5–5.0)
Alkaline Phosphatase: 90 U/L (ref 38–126)
Anion gap: 10 (ref 5–15)
BUN: 30 mg/dL — ABNORMAL HIGH (ref 8–23)
CO2: 28 mmol/L (ref 22–32)
Calcium: 8.6 mg/dL — ABNORMAL LOW (ref 8.9–10.3)
Chloride: 101 mmol/L (ref 98–111)
Creatinine: 1.76 mg/dL — ABNORMAL HIGH (ref 0.44–1.00)
GFR, Est AFR Am: 29 mL/min — ABNORMAL LOW (ref >60)
GFR, Estimated: 25 mL/min — ABNORMAL LOW (ref >60)
Glucose, Bld: 111 mg/dL — ABNORMAL HIGH (ref 70–99)
Potassium: 3.8 mmol/L (ref 3.5–5.1)
Sodium: 139 mmol/L (ref 135–145)
Total Bilirubin: 0.6 mg/dL (ref 0.3–1.2)
Total Protein: 6.6 g/dL (ref 6.5–8.1)

## 2019-11-25 LAB — CBC WITH DIFFERENTIAL (CANCER CENTER ONLY)
Abs Immature Granulocytes: 0.15 10*3/uL — ABNORMAL HIGH (ref 0.00–0.07)
Basophils Absolute: 0 10*3/uL (ref 0.0–0.1)
Basophils Relative: 0 %
Eosinophils Absolute: 0.1 10*3/uL (ref 0.0–0.5)
Eosinophils Relative: 1 %
HCT: 36.8 % (ref 36.0–46.0)
Hemoglobin: 12.4 g/dL (ref 12.0–15.0)
Immature Granulocytes: 1 %
Lymphocytes Relative: 20 %
Lymphs Abs: 2.1 10*3/uL (ref 0.7–4.0)
MCH: 30.5 pg (ref 26.0–34.0)
MCHC: 33.7 g/dL (ref 30.0–36.0)
MCV: 90.6 fL (ref 80.0–100.0)
Monocytes Absolute: 0.7 10*3/uL (ref 0.1–1.0)
Monocytes Relative: 7 %
Neutro Abs: 7.9 10*3/uL — ABNORMAL HIGH (ref 1.7–7.7)
Neutrophils Relative %: 71 %
Platelet Count: 282 10*3/uL (ref 150–400)
RBC: 4.06 MIL/uL (ref 3.87–5.11)
RDW: 13.6 % (ref 11.5–15.5)
WBC Count: 11 10*3/uL — ABNORMAL HIGH (ref 4.0–10.5)
nRBC: 0 % (ref 0.0–0.2)

## 2019-11-25 NOTE — Telephone Encounter (Signed)
Scheduled appt per 4/14 los.  Printed calendar and avs.

## 2019-11-25 NOTE — Progress Notes (Signed)
[facility] OFFICE PROGRESS NOTE  Patient Care Team: Buzzy Han, MD as PCP - General (Family Medicine) Troy Sine, MD as PCP - Cardiology (Cardiology)  HEME/ONC OVERVIEW: 1. Stage IIIB [id number]) adenocarcinoma of the R lung, unresectable, MET exon 14 splice variant deletion+ -Late 05/2019:   A large R lung mass extending from the RUL to RLL with mediastinal invasion, R hilar denopathy on CT; no mets in abdomen  Questionable small foci in L frontal and R parietal lobes on MRI brain, limited due to motion degradation; repeat MRI still limited by   Bronch w/ R lung bx, c/w adenocarcinoma; MET exon 14 splice variant deletion+  Large FDG-avid R lung mass with extension to the R hilum and paratracheal LN involvement; no distant mets  -07/2019 - present: palliative capmatinib   09/2019: improving RUL and RLL malignancy and nodal metastasis; no new or metastatic disease   TREATMENT SUMMARY:  07/22/2019 - present: capmatinib, currently on 346m BID   ASSESSMENT & PLAN:   Stage IIIB (([id number] adenocarcinoma of the R lung, unresectable, MET exon 14 splice variant deletion+ -On capmatinib since 07/2019; treatment relatively well tolerated, except fluid retention (see the management of toxicity below) -Due to persistent fluid retention, capmatinib reduced to 3050mBID  -Labs reviewed and adequate, continue capmatinib 30051mID  -I have ordered CT CAP w/ contrast in early 12/2019 to assess disease response   Of note, patient has a history of contrast allergy, and requires premedication with Benadryl and prednisone -Periodic TSH monitoring   Fluid retention -Secondary to capmatinib -Overall improving; patient completed dexamethasone taper and is still on Lasix 34m28mily -As her swelling is relatively mild and she has developed mild AKI, I recommended the patient to stop Lasix -If her swelling becomes more prominent, we can consider repeating a trial of  steroid    -As amlodipine can cause fluid retention, I have instructed the patient to hold her amlodipine for the next few weeks to see if the swelling improves  Leukocytosis -Secondary to steroid -WBC 11.0k today, improving -Patient denies any symptoms of infection -We will monitor it closely   AKI -Likely due to mild volume depletion -Cr 1.7 today, mildly elevated -I instructed the patient to stop Laxis, and encouraged her to increase oral hydration over the next a few weeks -I will repeat her labs in 2 weeks to monitor the renal function, and if still elevated, we can consider IV fluid support  HTN -Currently on amlodipine 5 mg daily -BP normal today -As discussed above, I instructed the patient to hold amlodipine, and check her BP twice a day -If BP becomes elevated (ie > 140/100), patient instructed to contact the clinic for further instructions  No orders of the defined types were placed in this encounter.  All questions were answered. The patient knows to call the clinic with any problems, questions or concerns. No barriers to learning was detected.  Return in 2 weeks for labs only, and in 4 weeks for labs, imaging results and clinic appt.   Cornelious Diven Tish Men 4/14/20212:40 PM  CHIEF COMPLAINT: "I am doing okay"  INTERVAL HISTORY: [facility] for follow-up of locally advanced adenocarcinoma of the right lung on capmatinib. Patient completed her steroid taper on 11/18/2019, and started the lower dose of her capmatinib (300 mg BID) on 11/19/2019. The swelling in her lower extremity is overall improving, mostly at night. She still takes Lasix 80 mg daily. She also takes amlodipine 5 mg daily  for about a year. Her blood pressure has been normal. She denies any other complaint today.  REVIEW OF SYSTEMS:   Constitutional: ( - ) fevers, ( - )  chills , ( - ) night sweats Eyes: ( - ) blurriness of vision, ( - ) double vision, ( - ) watery eyes Ears, nose, mouth, throat, and  face: ( - ) mucositis, ( - ) sore throat Respiratory: ( - ) cough, ( - ) dyspnea, ( - ) wheezes Cardiovascular: ( - ) palpitation, ( - ) chest discomfort, ( + ) lower extremity swelling Gastrointestinal:  ( - ) nausea, ( - ) heartburn, ( - ) change in bowel habits Skin: ( - ) abnormal skin rashes Lymphatics: ( - ) new lymphadenopathy, ( - ) easy bruising Neurological: ( - ) numbness, ( - ) tingling, ( - ) new weaknesses Behavioral/Psych: ( - ) mood change, ( - ) new changes  All other systems were reviewed with the patient and are negative.  SUMMARY OF ONCOLOGIC HISTORY: Oncology History  Adenocarcinoma of right lung ([place])  06/08/2019 Imaging   CT chest: IMPRESSION: 1. Central RIGHT UPPER LOBE lung mass with mediastinal extension as described above and associated post-obstructive atelectasis and pneumonitis in the RIGHT UPPER LOBE, associated with necrosis. 2. Second mass is suspected in the superior segment LEFT LOWER LOBE with associated pneumonitis and necrosis. 3. RIGHT hilar lymphadenopathy, likely metastatic. 4. No evidence of metastatic disease elsewhere. 5. Approximate 1.2 cm RIGHT lobe thyroid nodule, not present on the 2008 CT. Thyroid ultrasound may be confirmatory.   06/09/2019 Initial Diagnosis   Lung cancer ([place])   06/09/2019 Imaging   MRI brain: IMPRESSION: 1. Severely motion degraded study, which limits the detection of metastatic lesions. The below findings are nonspecific due to the degree of artifact and repeating the examination should be considered when the patient can be made more comfortable and able to tolerate the length of the study. 2. Punctate focus of abnormal diffusion restriction within the anterior right parietal lobe may indicate a small area of acute ischemia. Small metastases can also cause diffusion restriction, but there is no corresponding contrast enhancement visible. 3. 2 small foci of contrast enhancement within the left frontal  lobe and right parietal lobe. These may be vascular, though small metastases could have this appearance.IMPRESSION: 1. Severely motion degraded study, which limits the detection of metastatic lesions. The below findings are nonspecific due to the degree of artifact and repeating the examination should be considered when the patient can be made more comfortable and able to tolerate the length of the study. 2. Punctate focus of abnormal diffusion restriction within the anterior right parietal lobe may indicate a small area of acute ischemia. Small metastases can also cause diffusion restriction, but there is no corresponding contrast enhancement visible. 3. 2 small foci of contrast enhancement within the left frontal lobe and right parietal lobe. These may be vascular, though small metastases could have this appearance.   06/09/2019 Imaging   CT abdomen/pelvis: IMPRESSION: 1. No CT findings for metastatic disease involving the abdomen/pelvis or bony structures. 2. Status post cholecystectomy with associated mild biliary dilatation. 3. Advanced atherosclerotic calcifications involving the aorta and iliac arteries but no aneurysm or dissection.   06/26/2019 Imaging   PET:  IMPRESSION: 1. Hypermetabolic right upper/right lower lobe thick-walled cavitary mass with a hypermetabolic ipsilateral low right paratracheal lymph node, findings most consistent with primary bronchogenic carcinoma (T4 N2 M0 or stage IIIB disease). 2. Aortic atherosclerosis (ICD10-170.0).  Coronary artery calcification. 3.  Emphysema (ICD10-J43.9).   06/26/2019 Imaging   MRI brain w/o contrast: IMPRESSION: The patient was able to hold still for the unenhanced images however was moving during the postcontrast on the images, limiting evaluation for metastatic disease   No definite metastatic deposits identified   Atrophy and chronic microvascular ischemic change in the white matter.   07/22/2019 Cancer  Staging   Staging form: Lung, AJCC 8th Edition - Clinical: Stage IIIB (cT4, cN2, cM0) - Signed by Tish Men, MD on 07/22/2019   09/14/2019 Imaging   CT chest, abdomen and pelvis:   IMPRESSION: Mass-like cavitary lesion in the right upper and lower lobes, corresponding to the patient's known primary bronchogenic neoplasm, improved. Decreased soft tissue component.   Improving right suprahilar nodal metastasis.   No evidence of metastatic disease in the abdomen/pelvis.   Aortic Atherosclerosis (ICD10-I70.0) and Emphysema (ICD10-J43.9).     I have reviewed the past medical history, past surgical history, social history and family history with the patient and they are unchanged from previous note.  ALLERGIES:  is allergic to acetaminophen; benadryl [diphenhydramine]; zetia [ezetimibe]; gabapentin; hydrochlorothiazide; ace inhibitors; codeine; erythromycin; etodolac; and ivp dye [iodinated diagnostic agents].  MEDICATIONS:  Current Outpatient Medications  Medication Sig Dispense Refill  . acetaminophen (TYLENOL) 325 MG tablet Take 650 mg by mouth every 6 (six) hours as needed for mild pain or headache.    Marland Kitchen amLODipine (NORVASC) 5 MG tablet Take 5 mg by mouth daily.    Marland Kitchen aspirin 81 MG tablet Take 81 mg by mouth daily.    . Calcium 1500 MG tablet Take 1,500 mg by mouth daily.    . capmatinib (TABRECTA) 150 MG tablet Take 2 tablets (300 mg total) by mouth 2 (two) times daily. 120 tablet 11  . cholecalciferol (VITAMIN D) 1000 UNITS tablet Take 1,000 Units by mouth daily. Take 1 tab daily    . dexamethasone (DECADRON) 4 MG tablet Take half a tab twice a day for one week, and if well tolerated, increase to 1 tab twice a day. Take with food. 60 tablet 3  . fluticasone (CUTIVATE) 0.05 % cream Apply topically 2 (two) times daily. 30 g 4  . NON FORMULARY at bedtime. BiPAP    . Omega-3 Fatty Acids (FISH OIL) 1000 MG CAPS Take 1,000 mg by mouth daily.     . ondansetron (ZOFRAN) 8 MG tablet Take 1  tablet (8 mg total) by mouth every 8 (eight) hours as needed for nausea or vomiting. 30 tablet 2  . polyvinyl alcohol (LIQUIFILM TEARS) 1.4 % ophthalmic solution Place 1 drop into both eyes as needed for dry eyes.    . potassium chloride SA (KLOR-CON) 20 MEQ tablet Take 1 tablet (20 mEq total) by mouth daily. 30 tablet 5  . prochlorperazine (COMPAZINE) 10 MG tablet Take 1 tablet (10 mg total) by mouth every 6 (six) hours as needed for nausea or vomiting. 30 tablet 3  . RESTASIS 0.05 % ophthalmic emulsion Place 1 drop into both eyes 2 (two) times daily. Use 1 drop in each eye twice a day    . rosuvastatin (CRESTOR) 5 MG tablet Take 1 tablet (5 mg total) by mouth every other day. 45 tablet 2  . furosemide (LASIX) 40 MG tablet Take 1 tablet (40 mg total) by mouth daily as needed. 30 tablet 5   No current facility-administered medications for this visit.    PHYSICAL EXAMINATION: ECOG PERFORMANCE STATUS: 2 - Symptomatic, <50% confined to bed  Today's Vitals   11/25/19 1430 11/25/19 1435  BP: 130/82   Pulse: 90   Resp: 18   Temp: 98.7 F (37.1 C)   TempSrc: Temporal   SpO2: 98%   Weight: 140 lb (63.5 kg)   Height: '4\' 11"'  (1.499 m)   PainSc:  0-No pain   Body mass index is 28.28 kg/m.  Filed Weights   11/25/19 1430  Weight: 140 lb (63.5 kg)    GENERAL: alert, no distress and comfortable SKIN: skin color, texture, turgor are normal, no rashes or significant lesions EYES: conjunctiva are pink and non-injected, sclera clear OROPHARYNX: no exudate, no erythema; lips, buccal mucosa, and tongue normal  NECK: supple, non-tender LUNGS: clear to auscultation with normal breathing effort HEART: regular rate & rhythm and no murmurs and no significant lower extremity edema ABDOMEN: soft, non-tender, non-distended, normal bowel sounds Musculoskeletal: no cyanosis of digits and no clubbing  PSYCH: alert & oriented x 3, fluent speech  LABORATORY DATA:  I have reviewed the data as listed     Component Value Date/Time   NA 139 11/25/2019 1359   NA 140 09/22/2018 1102   K 3.8 11/25/2019 1359   CL 101 11/25/2019 1359   CO2 28 11/25/2019 1359   GLUCOSE 111 (H) 11/25/2019 1359   BUN 30 (H) 11/25/2019 1359   BUN 15 09/22/2018 1102   CREATININE 1.76 (H) 11/25/2019 1359   CREATININE 1.35 (H) 01/16/2017 0817   CALCIUM 8.6 (L) 11/25/2019 1359   PROT 6.6 11/25/2019 1359   PROT 6.8 09/22/2018 1102   ALBUMIN 3.0 (L) 11/25/2019 1359   ALBUMIN 3.9 09/22/2018 1102   AST 22 11/25/2019 1359   ALT 31 11/25/2019 1359   ALKPHOS 90 11/25/2019 1359   BILITOT 0.6 11/25/2019 1359   GFRNONAA 25 (L) 11/25/2019 1359   GFRAA 29 (L) 11/25/2019 1359    No results found for: SPEP, UPEP  Lab Results  Component Value Date   WBC 11.0 (H) 11/25/2019   NEUTROABS 7.9 (H) 11/25/2019   HGB 12.4 11/25/2019   HCT 36.8 11/25/2019   MCV 90.6 11/25/2019   PLT 282 11/25/2019      Chemistry      Component Value Date/Time   NA 139 11/25/2019 1359   NA 140 09/22/2018 1102   K 3.8 11/25/2019 1359   CL 101 11/25/2019 1359   CO2 28 11/25/2019 1359   BUN 30 (H) 11/25/2019 1359   BUN 15 09/22/2018 1102   CREATININE 1.76 (H) 11/25/2019 1359   CREATININE 1.35 (H) 01/16/2017 0817      Component Value Date/Time   CALCIUM 8.6 (L) 11/25/2019 1359   ALKPHOS 90 11/25/2019 1359   AST 22 11/25/2019 1359   ALT 31 11/25/2019 1359   BILITOT 0.6 11/25/2019 1359       RADIOGRAPHIC STUDIES: I have personally reviewed the radiological images as listed below and agreed with the findings in the report. No results found.

## 2019-11-30 ENCOUNTER — Telehealth: Payer: Self-pay | Admitting: Adult Health

## 2019-11-30 NOTE — Telephone Encounter (Signed)
Pt c/o medication issue:  1. Name of Medication: amLODipine (NORVASC) 5 MG tablet  2. How are you currently taking this medication (dosage and times per day)? Not currently taking medication   3. Are you having a reaction (difficulty breathing--STAT)? No   4. What is your medication issue? Jo Hendrix is calling to inform Dr. Claiborne Billings and [name] that her Oncologist took her off of amlodipine last Thursday 11/26/19. She states she was taken off of it because of swelling in her legs and dizziness. Please advise.

## 2019-11-30 NOTE — Telephone Encounter (Signed)
Note in epic from 11/25/19: BP 130/82 Fluid retention -Secondary to capmatinib -Overall improving; patient completed dexamethasone taper and is still on Lasix 80mg  daily -As her swelling is relatively mild and she has developed mild AKI, I recommended the patient to stop Lasix -If her swelling becomes more prominent, we can consider repeating a trial of steroid    -As amlodipine can cause fluid retention, I have instructed the patient to hold her amlodipine for the next few weeks to see if the swelling improves  Spoke with patient about BP and med reaction. She reports her SBP has been under 140 since stopping amlodipine and she has been checking BP twice daily. Advised to continue to monitor, call w/update on BP readings and swelling end of this week for further advice.   Will notify Curt Bears NP who saw patient at last visit with primary MD

## 2019-12-04 NOTE — Telephone Encounter (Signed)
ACKNOWLEDGED

## 2019-12-07 ENCOUNTER — Telehealth: Payer: Self-pay | Admitting: *Deleted

## 2019-12-07 NOTE — Telephone Encounter (Signed)
Received vm message from patient this morning. She states that the swelling has gotten worse to her legs and feet, face and arms. She would like to know what to do at this point. Should she stop or continue amlodipine?  Is there anything else that she should do? Attempted call back but no answer. VM message left for pt to return my call.  Dr. Maylon Peppers Please advise

## 2019-12-08 ENCOUNTER — Other Ambulatory Visit: Payer: Self-pay | Admitting: Hematology

## 2019-12-08 DIAGNOSIS — C3491 Malignant neoplasm of unspecified part of right bronchus or lung: Secondary | ICD-10-CM

## 2019-12-08 DIAGNOSIS — R609 Edema, unspecified: Secondary | ICD-10-CM

## 2019-12-08 MED ORDER — DEXAMETHASONE 2 MG PO TABS: 2.0000 mg | ORAL_TABLET | Freq: Every day | ORAL | 3 refills | Status: DC

## 2019-12-08 NOTE — Telephone Encounter (Signed)
I will send the patient a prescription for dexamethasone 2mg  daily, which she can start when she receives the medication. She should call us back if the steroid does not help with the swelling in 1 week.  Thanks.  Dr. Maylon Peppers

## 2019-12-09 ENCOUNTER — Inpatient Hospital Stay: Payer: Medicare HMO

## 2019-12-09 ENCOUNTER — Other Ambulatory Visit: Payer: Self-pay

## 2019-12-09 DIAGNOSIS — C3481 Malignant neoplasm of overlapping sites of right bronchus and lung: Secondary | ICD-10-CM | POA: Diagnosis not present

## 2019-12-09 DIAGNOSIS — C3491 Malignant neoplasm of unspecified part of right bronchus or lung: Secondary | ICD-10-CM

## 2019-12-09 LAB — CBC WITH DIFFERENTIAL (CANCER CENTER ONLY)
Abs Immature Granulocytes: 0.16 10*3/uL — ABNORMAL HIGH (ref 0.00–0.07)
Basophils Absolute: 0 10*3/uL (ref 0.0–0.1)
Basophils Relative: 0 %
Eosinophils Absolute: 0.1 10*3/uL (ref 0.0–0.5)
Eosinophils Relative: 1 %
HCT: 37.7 % (ref 36.0–46.0)
Hemoglobin: 12.2 g/dL (ref 12.0–15.0)
Immature Granulocytes: 2 %
Lymphocytes Relative: 13 %
Lymphs Abs: 1.4 10*3/uL (ref 0.7–4.0)
MCH: 30.8 pg (ref 26.0–34.0)
MCHC: 32.4 g/dL (ref 30.0–36.0)
MCV: 95.2 fL (ref 80.0–100.0)
Monocytes Absolute: 0.3 10*3/uL (ref 0.1–1.0)
Monocytes Relative: 3 %
Neutro Abs: 8.8 10*3/uL — ABNORMAL HIGH (ref 1.7–7.7)
Neutrophils Relative %: 81 %
Platelet Count: 313 10*3/uL (ref 150–400)
RBC: 3.96 MIL/uL (ref 3.87–5.11)
RDW: 14.6 % (ref 11.5–15.5)
WBC Count: 10.8 10*3/uL — ABNORMAL HIGH (ref 4.0–10.5)
nRBC: 0 % (ref 0.0–0.2)

## 2019-12-09 LAB — CMP (CANCER CENTER ONLY)
ALT: 25 U/L (ref 0–44)
AST: 24 U/L (ref 15–41)
Albumin: 2.7 g/dL — ABNORMAL LOW (ref 3.5–5.0)
Alkaline Phosphatase: 88 U/L (ref 38–126)
Anion gap: 5 (ref 5–15)
BUN: 14 mg/dL (ref 8–23)
CO2: 25 mmol/L (ref 22–32)
Calcium: 8.5 mg/dL — ABNORMAL LOW (ref 8.9–10.3)
Chloride: 109 mmol/L (ref 98–111)
Creatinine: 1.2 mg/dL — ABNORMAL HIGH (ref 0.44–1.00)
GFR, Est AFR Am: 46 mL/min — ABNORMAL LOW (ref >60)
GFR, Estimated: 39 mL/min — ABNORMAL LOW (ref >60)
Glucose, Bld: 90 mg/dL (ref 70–99)
Potassium: 4.4 mmol/L (ref 3.5–5.1)
Sodium: 139 mmol/L (ref 135–145)
Total Bilirubin: 0.5 mg/dL (ref 0.3–1.2)
Total Protein: 6 g/dL — ABNORMAL LOW (ref 6.5–8.1)

## 2019-12-09 NOTE — Telephone Encounter (Signed)
[name] patient. No answer but no answer. No vm messaging ability so no message was left for patient.

## 2019-12-10 ENCOUNTER — Telehealth: Payer: Self-pay | Admitting: Cardiovascular Disease

## 2019-12-10 NOTE — Telephone Encounter (Signed)
Jo Hendrix from [facility] called and stated that the patient has Central Retinal Vein Occlusion and patient has swelling in her right eye. She states that the patient's Optometrist recommends she get a carotid doppler test and echo and then follow up with cardiology. She states that they do no set the patient up with these so Dr. Claiborne Billings will need to put the orders in and schedule.

## 2019-12-14 ENCOUNTER — Telehealth: Payer: Self-pay | Admitting: *Deleted

## 2019-12-14 NOTE — Telephone Encounter (Signed)
Received vm message from patient this am, stating that the swelling she has been experiencing has not improved with current round of steroids.  She also states that she feels more short of breath than usual.  She noticed this last night while laying down and also with exertion.  Please advise

## 2019-12-15 ENCOUNTER — Other Ambulatory Visit: Payer: Self-pay | Admitting: Hematology

## 2019-12-15 DIAGNOSIS — R0601 Orthopnea: Secondary | ICD-10-CM

## 2019-12-15 NOTE — Telephone Encounter (Signed)
Can we have her come in for a chest X-ray? I have also ordered an echocardiogram to make sure that she doesn't have any heart failure. She may resume Lasix 40mg  daily as needed for swelling.  Thanks.  Dr. Maylon Peppers

## 2019-12-15 NOTE — Telephone Encounter (Signed)
Patient called to check on status of this call.

## 2019-12-16 ENCOUNTER — Telehealth: Payer: Self-pay | Admitting: *Deleted

## 2019-12-16 NOTE — Telephone Encounter (Signed)
Called Jo Hendrix regarding swelling and SOB.   Instructed her to resume lasix. Echocardiogram ordered for Thursday at 0900. Pt states she does not feel she needs CXR at this time- has CT ordered for next week. Dr Maylon Peppers is OK for her to wait as long as she is breathing ok. Reminded her that the CXR is ordered and if she feels SOB tomorrow she can go to Radiology and have CXR.

## 2019-12-16 NOTE — Telephone Encounter (Signed)
Ok to schedule.

## 2019-12-17 ENCOUNTER — Other Ambulatory Visit: Payer: Self-pay

## 2019-12-17 ENCOUNTER — Other Ambulatory Visit: Payer: Self-pay | Admitting: Cardiovascular Disease

## 2019-12-17 ENCOUNTER — Ambulatory Visit (HOSPITAL_COMMUNITY)
Admission: RE | Admit: 2019-12-17 | Discharge: 2019-12-17 | Disposition: A | Discharge status: Home / Self Care | Payer: Medicare HMO | Source: Ambulatory Visit | Attending: Hematology | Admitting: Hematology

## 2019-12-17 ENCOUNTER — Telehealth (HOSPITAL_COMMUNITY): Payer: Self-pay | Admitting: Cardiovascular Disease

## 2019-12-17 ENCOUNTER — Telehealth: Payer: Self-pay | Admitting: *Deleted

## 2019-12-17 DIAGNOSIS — R0601 Orthopnea: Secondary | ICD-10-CM

## 2019-12-17 DIAGNOSIS — I10 Essential (primary) hypertension: Secondary | ICD-10-CM | POA: Diagnosis not present

## 2019-12-17 DIAGNOSIS — J449 Chronic obstructive pulmonary disease, unspecified: Secondary | ICD-10-CM | POA: Insufficient documentation

## 2019-12-17 DIAGNOSIS — G4730 Sleep apnea, unspecified: Secondary | ICD-10-CM | POA: Insufficient documentation

## 2019-12-17 DIAGNOSIS — Z87891 Personal history of nicotine dependence: Secondary | ICD-10-CM | POA: Insufficient documentation

## 2019-12-17 DIAGNOSIS — I272 Pulmonary hypertension, unspecified: Secondary | ICD-10-CM | POA: Diagnosis not present

## 2019-12-17 DIAGNOSIS — H05221 Edema of right orbit: Secondary | ICD-10-CM

## 2019-12-17 DIAGNOSIS — I2510 Atherosclerotic heart disease of native coronary artery without angina pectoris: Secondary | ICD-10-CM | POA: Insufficient documentation

## 2019-12-17 NOTE — Telephone Encounter (Signed)
Patient scheduled for carotid doppler tomorrow at 11am. She had an echo this morning that was put in by another provider. Thank you!

## 2019-12-17 NOTE — Telephone Encounter (Signed)
I went to schedule patient an echocardiogram per DR. Claiborne Billings and noticed that patient is having echocardiogram at the [facility] today at the [facility].  Do I still need to schedule? If not I will remove from the WQ.

## 2019-12-17 NOTE — Telephone Encounter (Signed)
-----   Message from Tish Men, MD sent at 12/17/2019 11:06 AM EDT ----- Hi Beth,  Can you let ms. Kittrell know that her CXR and echocardiogram were both normal, so I would recommend continuing low dose steroid and diuretics, as well as her chemo pills?Thanks.  GZ  ----- Message ----- From: Interface, [employer] Sent: 12/17/2019  10:45 AM EDT To: Tish Men, MD

## 2019-12-17 NOTE — Progress Notes (Signed)
  Echocardiogram 2D Echocardiogram has been performed.  Levone Otten G Sheena Donegan 12/17/2019, 10:09 AM

## 2019-12-17 NOTE — Telephone Encounter (Signed)
[name] patient regarding the results of her CXR and cardiac echo.  Spoke with patient and informed her that the results of the tests were normal. Advised that  Dr. Maylon Peppers wants her to continue the low dose steroids and the diuretic and chemo pills. Advised that Dr. Maylon Peppers will review all with her again when he sees her on 12/23/19. Jo Hendrix is aware of her upcoming appts with Dr. Maylon Peppers and CT scans. No questions or concerns at this time.

## 2019-12-17 NOTE — Telephone Encounter (Signed)
LVM notifying that these test can be scheduled and that our schedulers would call to set up the appts. If they have any questions to call our office. Will place orders for carotid doppler and echo

## 2019-12-18 ENCOUNTER — Other Ambulatory Visit: Payer: Self-pay | Admitting: *Deleted

## 2019-12-18 ENCOUNTER — Ambulatory Visit (HOSPITAL_COMMUNITY)
Admission: RE | Admit: 2019-12-18 | Discharge: 2019-12-18 | Disposition: A | Discharge status: Home / Self Care | Payer: Medicare HMO | Source: Ambulatory Visit | Attending: Cardiology | Admitting: Cardiology

## 2019-12-18 ENCOUNTER — Telehealth: Payer: Self-pay | Admitting: *Deleted

## 2019-12-18 ENCOUNTER — Other Ambulatory Visit: Payer: Self-pay | Admitting: Cardiovascular Disease

## 2019-12-18 DIAGNOSIS — I6523 Occlusion and stenosis of bilateral carotid arteries: Secondary | ICD-10-CM | POA: Diagnosis not present

## 2019-12-18 DIAGNOSIS — H05221 Edema of right orbit: Secondary | ICD-10-CM | POA: Diagnosis not present

## 2019-12-18 MED ORDER — PREDNISONE 50 MG PO TABS
ORAL_TABLET | ORAL | 0 refills | Status: DC
Start: 2019-12-18 — End: 2019-12-23

## 2019-12-18 NOTE — Telephone Encounter (Signed)
Received call from [facility] in [facility] regarding opt's upcoming scan on Monday, 12/21/19. Pt has a dye allergy and Tedra Coupe would like to confirm pt's pre scan prep. Discussed with Dr. Lorenso Courier as Dr. Maylon Peppers not in the office today.  Received order for Prednisone 50 mg 13hr/7 hr and 1 hr prior to scan. Prescription sent in and call made to pt with instructions about how to take the prednisone. Pt states she has an allergy to Benadryl therefore no Benadryl ordered.

## 2019-12-21 ENCOUNTER — Ambulatory Visit (HOSPITAL_COMMUNITY)
Admission: RE | Admit: 2019-12-21 | Discharge: 2019-12-21 | Disposition: A | Discharge status: Home / Self Care | Payer: Medicare HMO | Source: Ambulatory Visit | Attending: Hematology | Admitting: Hematology

## 2019-12-21 ENCOUNTER — Other Ambulatory Visit: Payer: Self-pay

## 2019-12-21 DIAGNOSIS — C3491 Malignant neoplasm of unspecified part of right bronchus or lung: Secondary | ICD-10-CM | POA: Diagnosis present

## 2019-12-21 MED ORDER — IOHEXOL 300 MG/ML  SOLN
100.0000 mL | Freq: Once | INTRAMUSCULAR | Status: AC | PRN
  Administered 2019-12-21: 100 mL via INTRAVENOUS

## 2019-12-21 MED ORDER — SODIUM CHLORIDE (PF) 0.9 % IJ SOLN
INTRAMUSCULAR | Status: AC
  Filled 2019-12-21: qty 50

## 2019-12-22 NOTE — Telephone Encounter (Signed)
Thanks.  Dr. Maylon Peppers

## 2019-12-23 ENCOUNTER — Inpatient Hospital Stay: Payer: Medicare HMO

## 2019-12-23 ENCOUNTER — Inpatient Hospital Stay: Payer: Medicare HMO | Attending: Hematology | Admitting: Hematology

## 2019-12-23 ENCOUNTER — Encounter: Payer: Self-pay | Admitting: Hematology

## 2019-12-23 ENCOUNTER — Other Ambulatory Visit: Payer: Self-pay

## 2019-12-23 ENCOUNTER — Telehealth: Payer: Self-pay | Admitting: Hematology

## 2019-12-23 VITALS — BP 147/73 | HR 92 | Temp 98.3°F | Resp 18 | Ht 59.0 in | Wt 143.6 lb

## 2019-12-23 DIAGNOSIS — C3491 Malignant neoplasm of unspecified part of right bronchus or lung: Secondary | ICD-10-CM

## 2019-12-23 DIAGNOSIS — J449 Chronic obstructive pulmonary disease, unspecified: Secondary | ICD-10-CM | POA: Insufficient documentation

## 2019-12-23 DIAGNOSIS — E041 Nontoxic single thyroid nodule: Secondary | ICD-10-CM | POA: Insufficient documentation

## 2019-12-23 DIAGNOSIS — D72829 Elevated white blood cell count, unspecified: Secondary | ICD-10-CM | POA: Diagnosis not present

## 2019-12-23 DIAGNOSIS — Z87891 Personal history of nicotine dependence: Secondary | ICD-10-CM | POA: Diagnosis not present

## 2019-12-23 DIAGNOSIS — R609 Edema, unspecified: Secondary | ICD-10-CM | POA: Diagnosis not present

## 2019-12-23 DIAGNOSIS — Z803 Family history of malignant neoplasm of breast: Secondary | ICD-10-CM | POA: Diagnosis not present

## 2019-12-23 DIAGNOSIS — G4730 Sleep apnea, unspecified: Secondary | ICD-10-CM | POA: Diagnosis not present

## 2019-12-23 DIAGNOSIS — I700 Atherosclerosis of aorta: Secondary | ICD-10-CM | POA: Diagnosis not present

## 2019-12-23 DIAGNOSIS — Z79899 Other long term (current) drug therapy: Secondary | ICD-10-CM | POA: Insufficient documentation

## 2019-12-23 DIAGNOSIS — C3481 Malignant neoplasm of overlapping sites of right bronchus and lung: Secondary | ICD-10-CM | POA: Diagnosis not present

## 2019-12-23 DIAGNOSIS — I10 Essential (primary) hypertension: Secondary | ICD-10-CM | POA: Diagnosis not present

## 2019-12-23 DIAGNOSIS — Z7982 Long term (current) use of aspirin: Secondary | ICD-10-CM | POA: Insufficient documentation

## 2019-12-23 DIAGNOSIS — N179 Acute kidney failure, unspecified: Secondary | ICD-10-CM | POA: Insufficient documentation

## 2019-12-23 DIAGNOSIS — Z801 Family history of malignant neoplasm of trachea, bronchus and lung: Secondary | ICD-10-CM | POA: Insufficient documentation

## 2019-12-23 DIAGNOSIS — I2510 Atherosclerotic heart disease of native coronary artery without angina pectoris: Secondary | ICD-10-CM | POA: Insufficient documentation

## 2019-12-23 LAB — CBC WITH DIFFERENTIAL (CANCER CENTER ONLY)
Abs Immature Granulocytes: 0.13 10*3/uL — ABNORMAL HIGH (ref 0.00–0.07)
Basophils Absolute: 0 10*3/uL (ref 0.0–0.1)
Basophils Relative: 0 %
Eosinophils Absolute: 0 10*3/uL (ref 0.0–0.5)
Eosinophils Relative: 0 %
HCT: 45.4 % (ref 36.0–46.0)
Hemoglobin: 14.7 g/dL (ref 12.0–15.0)
Immature Granulocytes: 1 %
Lymphocytes Relative: 11 %
Lymphs Abs: 1.7 10*3/uL (ref 0.7–4.0)
MCH: 31.1 pg (ref 26.0–34.0)
MCHC: 32.4 g/dL (ref 30.0–36.0)
MCV: 96 fL (ref 80.0–100.0)
Monocytes Absolute: 0.6 10*3/uL (ref 0.1–1.0)
Monocytes Relative: 4 %
Neutro Abs: 12.3 10*3/uL — ABNORMAL HIGH (ref 1.7–7.7)
Neutrophils Relative %: 84 %
Platelet Count: 335 10*3/uL (ref 150–400)
RBC: 4.73 MIL/uL (ref 3.87–5.11)
RDW: 14.6 % (ref 11.5–15.5)
WBC Count: 14.7 10*3/uL — ABNORMAL HIGH (ref 4.0–10.5)
nRBC: 0 % (ref 0.0–0.2)

## 2019-12-23 LAB — CMP (CANCER CENTER ONLY)
ALT: 21 U/L (ref 0–44)
AST: 19 U/L (ref 15–41)
Albumin: 3.2 g/dL — ABNORMAL LOW (ref 3.5–5.0)
Alkaline Phosphatase: 90 U/L (ref 38–126)
Anion gap: 13 (ref 5–15)
BUN: 35 mg/dL — ABNORMAL HIGH (ref 8–23)
CO2: 29 mmol/L (ref 22–32)
Calcium: 9 mg/dL (ref 8.9–10.3)
Chloride: 100 mmol/L (ref 98–111)
Creatinine: 1.86 mg/dL — ABNORMAL HIGH (ref 0.44–1.00)
GFR, Est AFR Am: 27 mL/min — ABNORMAL LOW (ref >60)
GFR, Estimated: 23 mL/min — ABNORMAL LOW (ref >60)
Glucose, Bld: 150 mg/dL — ABNORMAL HIGH (ref 70–99)
Potassium: 4 mmol/L (ref 3.5–5.1)
Sodium: 142 mmol/L (ref 135–145)
Total Bilirubin: 0.5 mg/dL (ref 0.3–1.2)
Total Protein: 6.9 g/dL (ref 6.5–8.1)

## 2019-12-23 NOTE — Telephone Encounter (Signed)
Scheduled per 5/12 los. Printed out AVS and calender

## 2019-12-23 NOTE — Progress Notes (Signed)
[place] OFFICE PROGRESS NOTE  Patient Care Team: Buzzy Han, MD as PCP - General (Family Medicine) Troy Sine, MD as PCP - Cardiology (Cardiology)  HEME/ONC OVERVIEW: 1. Stage IIIB [phone]) adenocarcinoma of the R lung, unresectable, MET exon 14 splice variant deletion+ -Late 05/2019:   A large R lung mass extending from the RUL to RLL with mediastinal invasion, R hilar denopathy on CT; no mets in abdomen  Questionable small foci in L frontal and R parietal lobes on MRI brain, limited due to motion degradation; repeat MRI still limited by   Bronch w/ R lung bx, c/w adenocarcinoma; MET exon 14 splice variant deletion+  Large FDG-avid R lung mass with extension to the R hilum and paratracheal LN involvement; no distant mets  -07/2019 - present: palliative capmatinib   12/2019: improving RUL and RLL malignancy and nodal metastasis; no new or metastatic disease   TREATMENT SUMMARY:  07/22/2019 - present: capmatinib, currently on 389m BID   ASSESSMENT & PLAN:   Stage IIIB (([id number] adenocarcinoma of the R lung, unresectable, MET exon 14 splice variant deletion+ -I independently reviewed the radiologic images of recent CT chest, abdomen and pelvis, and agree with findings documented.  In summary, CT showed improvement in the RUL malignancy and nodal disease, as well as stable ill-defined opacity in the RLL.  There was no new or progressive disease. -I reviewed imaging results in detail. -Given that the fluid retention remains overall stable and is not causing significant discomfort, I would prefer continuing capmatinib at 300 mg BID  -We will monitor the disease response with CT CAP q2-382month   Of note, patient has a history of contrast allergy, and requires premedication with Benadryl and prednisone -Periodic TSH monitoring   Fluid retention -Secondary to capmatinib -Overall stable; since restarting dexamethasone and Lasix 8091maily PRN, the  swelling in the lower extremity has not changed substantially -Given the AKI, I recommended patient to stop the Lasix  -Okay to continue low-dose dexamethasone 2 mg daily  Leukocytosis -Secondary to steroid -WBC 14.7k today, stable -Patient denies any symptoms of infection -We will monitor it closely   AKI -Likely due to mild volume depletion -Cr 1.86 today, mildly elevated -I instructed the patient to stop Laxis, and encouraged her to increase oral hydration over the next a few weeks -I will repeat her labs in 2 weeks to monitor the renal function, and if still elevated, we can consider IV fluid support  No orders of the defined types were placed in this encounter.  All questions were answered. The patient knows to call the clinic with any problems, questions or concerns. No barriers to learning was detected.  Return in 2 weeks for labs and clinical follow-up with Dr. DorLorenso CourierYanTish MenD 5/12/20213:01 PM  CHIEF COMPLAINT: "I am doing okay"  INTERVAL HISTORY: Ms. RhuNeidlingerturns to clinic for follow-up of locally advanced adenocarcinoma of the right lung on capmatinib.  Patient reports that since starting low-dose dexamethasone and Lasix 80 mg daily, the swelling in her lower extremity has changed significantly.  She has tried Ace wrap and compression stockings, which did not seem to help with the lower extremity swelling.  She denies any significant discomfort with the swelling.  She is otherwise doing relatively well, and denies any other complaint today.   REVIEW OF SYSTEMS:   Constitutional: ( - ) fevers, ( - )  chills , ( - ) night sweats Eyes: ( - ) blurriness of vision, ( - )  double vision, ( - ) watery eyes Ears, nose, mouth, throat, and face: ( - ) mucositis, ( - ) sore throat Respiratory: ( - ) cough, ( - ) dyspnea, ( - ) wheezes Cardiovascular: ( - ) palpitation, ( - ) chest discomfort, ( + ) lower extremity swelling Gastrointestinal:  ( - ) nausea, ( - ) heartburn, (  - ) change in bowel habits Skin: ( - ) abnormal skin rashes Lymphatics: ( - ) new lymphadenopathy, ( - ) easy bruising Neurological: ( - ) numbness, ( - ) tingling, ( - ) new weaknesses Behavioral/Psych: ( - ) mood change, ( - ) new changes  All other systems were reviewed with the patient and are negative.  SUMMARY OF ONCOLOGIC HISTORY: Oncology History  Adenocarcinoma of right lung ([place])  06/08/2019 Imaging   CT chest: IMPRESSION: 1. Central RIGHT UPPER LOBE lung mass with mediastinal extension as described above and associated post-obstructive atelectasis and pneumonitis in the RIGHT UPPER LOBE, associated with necrosis. 2. Second mass is suspected in the superior segment LEFT LOWER LOBE with associated pneumonitis and necrosis. 3. RIGHT hilar lymphadenopathy, likely metastatic. 4. No evidence of metastatic disease elsewhere. 5. Approximate 1.2 cm RIGHT lobe thyroid nodule, not present on the 2008 CT. Thyroid ultrasound may be confirmatory.   06/09/2019 Initial Diagnosis   Lung cancer ([place])   06/09/2019 Imaging   MRI brain: IMPRESSION: 1. Severely motion degraded study, which limits the detection of metastatic lesions. The below findings are nonspecific due to the degree of artifact and repeating the examination should be considered when the patient can be made more comfortable and able to tolerate the length of the study. 2. Punctate focus of abnormal diffusion restriction within the anterior right parietal lobe may indicate a small area of acute ischemia. Small metastases can also cause diffusion restriction, but there is no corresponding contrast enhancement visible. 3. 2 small foci of contrast enhancement within the left frontal lobe and right parietal lobe. These may be vascular, though small metastases could have this appearance.IMPRESSION: 1. Severely motion degraded study, which limits the detection of metastatic lesions. The below findings are nonspecific due to  the degree of artifact and repeating the examination should be considered when the patient can be made more comfortable and able to tolerate the length of the study. 2. Punctate focus of abnormal diffusion restriction within the anterior right parietal lobe may indicate a small area of acute ischemia. Small metastases can also cause diffusion restriction, but there is no corresponding contrast enhancement visible. 3. 2 small foci of contrast enhancement within the left frontal lobe and right parietal lobe. These may be vascular, though small metastases could have this appearance.   06/09/2019 Imaging   CT abdomen/pelvis: IMPRESSION: 1. No CT findings for metastatic disease involving the abdomen/pelvis or bony structures. 2. Status post cholecystectomy with associated mild biliary dilatation. 3. Advanced atherosclerotic calcifications involving the aorta and iliac arteries but no aneurysm or dissection.   06/26/2019 Imaging   PET:  IMPRESSION: 1. Hypermetabolic right upper/right lower lobe thick-walled cavitary mass with a hypermetabolic ipsilateral low right paratracheal lymph node, findings most consistent with primary bronchogenic carcinoma (T4 N2 M0 or stage IIIB disease). 2. Aortic atherosclerosis (ICD10-170.0). Coronary artery calcification. 3.  Emphysema (ICD10-J43.9).   06/26/2019 Imaging   MRI brain w/o contrast: IMPRESSION: The patient was able to hold still for the unenhanced images however was moving during the postcontrast on the images, limiting evaluation for metastatic disease   No definite metastatic  deposits identified   Atrophy and chronic microvascular ischemic change in the white matter.   07/22/2019 Cancer Staging   Staging form: Lung, AJCC 8th Edition - Clinical: Stage IIIB (cT4, cN2, cM0) - Signed by Tish Men, MD on 07/22/2019   09/14/2019 Imaging   CT chest, abdomen and pelvis:   IMPRESSION: Mass-like cavitary lesion in the right upper and  lower lobes, corresponding to the patient's known primary bronchogenic neoplasm, improved. Decreased soft tissue component.   Improving right suprahilar nodal metastasis.   No evidence of metastatic disease in the abdomen/pelvis.   Aortic Atherosclerosis (ICD10-I70.0) and Emphysema (ICD10-J43.9).     I have reviewed the past medical history, past surgical history, social history and family history with the patient and they are unchanged from previous note.  ALLERGIES:  is allergic to acetaminophen; benadryl [diphenhydramine]; zetia [ezetimibe]; gabapentin; hydrochlorothiazide; ace inhibitors; codeine; erythromycin; etodolac; and ivp dye [iodinated diagnostic agents].  MEDICATIONS:  Current Outpatient Medications  Medication Sig Dispense Refill  . acetaminophen (TYLENOL) 325 MG tablet Take 650 mg by mouth every 6 (six) hours as needed for mild pain or headache.    Marland Kitchen amLODipine (NORVASC) 5 MG tablet Take 5 mg by mouth daily.    Marland Kitchen aspirin 81 MG tablet Take 81 mg by mouth daily.    . Calcium 1500 MG tablet Take 1,500 mg by mouth daily.    . cholecalciferol (VITAMIN D) 1000 UNITS tablet Take 1,000 Units by mouth daily. Take 1 tab daily    . dexamethasone (DECADRON) 2 MG tablet Take 1 tablet (2 mg total) by mouth daily. Take with meals. 30 tablet 3  . furosemide (LASIX) 40 MG tablet Take 1 tablet (40 mg total) by mouth daily as needed. 30 tablet 5  . NON FORMULARY at bedtime. BiPAP    . Omega-3 Fatty Acids (FISH OIL) 1000 MG CAPS Take 1,000 mg by mouth daily.     . ondansetron (ZOFRAN) 8 MG tablet Take 1 tablet (8 mg total) by mouth every 8 (eight) hours as needed for nausea or vomiting. 30 tablet 2  . polyvinyl alcohol (LIQUIFILM TEARS) 1.4 % ophthalmic solution Place 1 drop into both eyes as needed for dry eyes.    . potassium chloride SA (KLOR-CON) 20 MEQ tablet Take 1 tablet (20 mEq total) by mouth daily. 30 tablet 5  . prochlorperazine (COMPAZINE) 10 MG tablet Take 1 tablet (10 mg  total) by mouth every 6 (six) hours as needed for nausea or vomiting. 30 tablet 3  . RESTASIS 0.05 % ophthalmic emulsion Place 1 drop into both eyes 2 (two) times daily. Use 1 drop in each eye twice a day    . rosuvastatin (CRESTOR) 5 MG tablet Take 1 tablet (5 mg total) by mouth every other day. 45 tablet 2   No current facility-administered medications for this visit.    PHYSICAL EXAMINATION: ECOG PERFORMANCE STATUS: 2 - Symptomatic, <50% confined to bed  Today's Vitals   12/23/19 1416 12/23/19 1417 12/23/19 1435  BP: (!) 158/77 (!) 147/73   Pulse: 92    Resp: 18    Temp: 98.3 F (36.8 C)    TempSrc: Temporal    SpO2: 100%    Weight: 143 lb 9.6 oz (65.1 kg)    Height: '4\' 11"'$  (1.499 m)    PainSc:   0-No pain   Body mass index is 29 kg/m.  Filed Weights   12/23/19 1416  Weight: 143 lb 9.6 oz (65.1 kg)    GENERAL: alert,  no distress and comfortable SKIN: skin color, texture, turgor are normal, no rashes or significant lesions EYES: conjunctiva are pink and non-injected, sclera clear OROPHARYNX: no exudate, no erythema; lips, buccal mucosa, and tongue normal  NECK: supple, non-tender LUNGS: clear to auscultation with normal breathing effort HEART: regular rate & rhythm and no murmurs and 2+ bilateral lower extremity edema ABDOMEN: soft, non-tender, non-distended, normal bowel sounds Musculoskeletal: no cyanosis of digits and no clubbing  PSYCH: alert & oriented x 3, fluent speech  LABORATORY DATA:  I have reviewed the data as listed    Component Value Date/Time   NA 142 12/23/2019 1403   NA 140 09/22/2018 1102   K 4.0 12/23/2019 1403   CL 100 12/23/2019 1403   CO2 29 12/23/2019 1403   GLUCOSE 150 (H) 12/23/2019 1403   BUN 35 (H) 12/23/2019 1403   BUN 15 09/22/2018 1102   CREATININE 1.86 (H) 12/23/2019 1403   CREATININE 1.35 (H) 01/16/2017 0817   CALCIUM 9.0 12/23/2019 1403   PROT 6.9 12/23/2019 1403   PROT 6.8 09/22/2018 1102   ALBUMIN 3.2 (L) 12/23/2019 1403    ALBUMIN 3.9 09/22/2018 1102   AST 19 12/23/2019 1403   ALT 21 12/23/2019 1403   ALKPHOS 90 12/23/2019 1403   BILITOT 0.5 12/23/2019 1403   GFRNONAA 23 (L) 12/23/2019 1403   GFRAA 27 (L) 12/23/2019 1403    No results found for: SPEP, UPEP  Lab Results  Component Value Date   WBC 14.7 (H) 12/23/2019   NEUTROABS 12.3 (H) 12/23/2019   HGB 14.7 12/23/2019   HCT 45.4 12/23/2019   MCV 96.0 12/23/2019   PLT 335 12/23/2019      Chemistry      Component Value Date/Time   NA 142 12/23/2019 1403   NA 140 09/22/2018 1102   K 4.0 12/23/2019 1403   CL 100 12/23/2019 1403   CO2 29 12/23/2019 1403   BUN 35 (H) 12/23/2019 1403   BUN 15 09/22/2018 1102   CREATININE 1.86 (H) 12/23/2019 1403   CREATININE 1.35 (H) 01/16/2017 0817      Component Value Date/Time   CALCIUM 9.0 12/23/2019 1403   ALKPHOS 90 12/23/2019 1403   AST 19 12/23/2019 1403   ALT 21 12/23/2019 1403   BILITOT 0.5 12/23/2019 1403       RADIOGRAPHIC STUDIES: I have personally reviewed the radiological images as listed below and agreed with the findings in the report. DG Chest 2 View  Result Date: 12/17/2019 CLINICAL DATA:  Dyspnea on exertion. Orthopnea. EXAM: CHEST - 2 VIEW COMPARISON:  09/16/2019 and 09/14/2019 chest CT FINDINGS: Heart size is normal. Stable changes within the RIGHT UPPER lobe. There is perihilar peribronchial thickening, stable in appearance. Lungs are clear otherwise. No new consolidations or pleural effusions. No pulmonary edema. IMPRESSION: Stable appearance of the chest. Electronically Signed   By: Nolon Nations M.D.   On: 12/17/2019 10:43   CT CHEST W CONTRAST  Result Date: 12/21/2019 CLINICAL DATA:  Follow-up right lung squamous cell carcinoma. Undergoing chemotherapy. EXAM: CT CHEST, ABDOMEN, AND PELVIS WITH CONTRAST TECHNIQUE: Multidetector CT imaging of the chest, abdomen and pelvis was performed following the standard protocol during bolus administration of intravenous contrast. The  patient received a standard 13 hour steroid prep prior to the exam, and experienced no allergic symptoms. CONTRAST:  122m OMNIPAQUE IOHEXOL 300 MG/ML  SOLN COMPARISON:  09/14/2019 FINDINGS: CT CHEST FINDINGS Cardiovascular: No acute findings. Aortic and coronary artery atherosclerosis noted. Mediastinum/Lymph Nodes: Abnormal soft tissue  density in the right suprahilar/right paraesophageal region has decreased since previous study, currently measuring 1.3 cm short axis on image 18/2, compared to 2.0 cm previously. 1.3 cm right thyroid lobe nodule is stable. Lungs/Pleura: A rounded masslike opacity with central lucency is again seen in the central right upper lobe, and currently measures 5.1 x 4.9 cm on image 31/6, compared to 7.9 x 6.7 cm at same level previously. Ill-defined opacity in the superior segment of the right lower lobe shows no significant change. No new or enlarging areas of pulmonary opacity are seen in either lung. Mild emphysema is again noted. No evidence of pleural effusion. Musculoskeletal:  No suspicious bone lesions identified. CT ABDOMEN AND PELVIS FINDINGS Hepatobiliary: No masses identified. Prior cholecystectomy again noted. Mild biliary ductal dilatation is stable. Pancreas:  No mass or inflammatory changes. Spleen:  Within normal limits in size and appearance. Adrenals/Urinary tract: Normal adrenal glands. Small renal cysts remains stable. 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 No evidence of ureteral calculi or hydronephrosis. Unremarkable unopacified urinary bladder. Stomach/Bowel: No evidence of obstruction, inflammatory process, or abnormal fluid collections. Diverticulosis is seen mainly involving the descending and sigmoid colon, however there is no evidence of diverticulitis. Vascular/Lymphatic: No pathologically enlarged lymph nodes identified. No abdominal aortic aneurysm. Aortic atherosclerosis noted. Reproductive: Prior hysterectomy  noted. Adnexal regions are unremarkable in appearance. Other:  None. Musculoskeletal:  No suspicious bone lesions identified. IMPRESSION: 1. Decreased size of central right upper lobe masslike opacity. 2. Decreased right suprahilar/right paraesophageal lymphadenopathy. 3. Stable ill-defined opacity in superior segment of right lower lobe. 4. No new or progressive disease identified within the chest. No evidence of abdominal or pelvic metastatic disease. 5. Colonic diverticulosis, without radiographic evidence of diverticulitis. Aortic Atherosclerosis (ICD10-I70.0) and Emphysema (ICD10-J43.9). Electronically Signed   By: Marlaine Hind M.D.   On: 12/21/2019 16:07   CT ABDOMEN PELVIS W CONTRAST  Result Date: 12/21/2019 CLINICAL DATA:  Follow-up right lung squamous cell carcinoma. Undergoing chemotherapy. EXAM: CT CHEST, ABDOMEN, AND PELVIS WITH CONTRAST TECHNIQUE: Multidetector CT imaging of the chest, abdomen and pelvis was performed following the standard protocol during bolus administration of intravenous contrast. The patient received a standard 13 hour steroid prep prior to the exam, and experienced no allergic symptoms. CONTRAST:  152m OMNIPAQUE IOHEXOL 300 MG/ML  SOLN COMPARISON:  09/14/2019 FINDINGS: CT CHEST FINDINGS Cardiovascular: No acute findings. Aortic and coronary artery atherosclerosis noted. Mediastinum/Lymph Nodes: Abnormal soft tissue density in the right suprahilar/right paraesophageal region has decreased since previous study, currently measuring 1.3 cm short axis on image 18/2, compared to 2.0 cm previously. 1.3 cm right thyroid lobe nodule is stable. Lungs/Pleura: A rounded masslike opacity with central lucency is again seen in the central right upper lobe, and currently measures 5.1 x 4.9 cm on image 31/6, compared to 7.9 x 6.7 cm at same level previously. Ill-defined opacity in the superior segment of the right lower lobe shows no significant change. No new or enlarging areas of pulmonary  opacity are seen in either lung. Mild emphysema is again noted. No evidence of pleural effusion. Musculoskeletal:  No suspicious bone lesions identified. CT ABDOMEN AND PELVIS FINDINGS Hepatobiliary: No masses identified. Prior cholecystectomy again noted. Mild biliary ductal dilatation is stable. Pancreas:  No mass or inflammatory changes. Spleen:  Within normal limits in size and appearance. Adrenals/Urinary tract: Normal adrenal glands. Small renal cysts remains stable. 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 No evidence  of ureteral calculi or hydronephrosis. Unremarkable unopacified urinary bladder. Stomach/Bowel: No evidence of obstruction, inflammatory process, or abnormal fluid collections. Diverticulosis is seen mainly involving the descending and sigmoid colon, however there is no evidence of diverticulitis. Vascular/Lymphatic: No pathologically enlarged lymph nodes identified. No abdominal aortic aneurysm. Aortic atherosclerosis noted. Reproductive: Prior hysterectomy noted. Adnexal regions are unremarkable in appearance. Other:  None. Musculoskeletal:  No suspicious bone lesions identified. IMPRESSION: 1. Decreased size of central right upper lobe masslike opacity. 2. Decreased right suprahilar/right paraesophageal lymphadenopathy. 3. Stable ill-defined opacity in superior segment of right lower lobe. 4. No new or progressive disease identified within the chest. No evidence of abdominal or pelvic metastatic disease. 5. Colonic diverticulosis, without radiographic evidence of diverticulitis. Aortic Atherosclerosis (ICD10-I70.0) and Emphysema (ICD10-J43.9). Electronically Signed   By: Marlaine Hind M.D.   On: 12/21/2019 16:07   ECHOCARDIOGRAM COMPLETE  Result Date: 12/17/2019    ECHOCARDIOGRAM REPORT   Patient Name:   LAURAN ROMANSKI Mcparland Date of Exam: 12/17/2019 Medical Rec #:  [id number]     Height:       59.0 in Accession #:    [id number]    Weight:       140.0 lb Date of  Birth:  [birth year]     BSA:          1.585 m Patient Age:    86 years      BP:           136/70 mmHg Patient Gender: F             HR:           70 bpm. Exam Location:  Outpatient Procedure: 2D Echo, Cardiac Doppler, Color Doppler and Strain Analysis Indications:    Z51.11 Encounter for antineoplastic chemotheraphy  History:        Patient has prior history of Echocardiogram examinations, most                 recent 06/10/2019. CAD, Pulmonary HTN and COPD,                 Signs/Symptoms:Dyspnea; Risk Factors:Former Smoker, Hypertension                 and Sleep Apnea.  Sonographer:    Jonelle Sidle Dance Referring Phys: [id number] [place]  1. Left ventricular ejection fraction, by estimation, is 60 to 65%. The left ventricle has normal function. The left ventricle has no regional wall motion abnormalities. Left ventricular diastolic parameters are consistent with Grade I diastolic dysfunction (impaired relaxation). The average left ventricular global longitudinal strain is -17.0 %.  2. Right ventricular systolic function is normal. The right ventricular size is normal.  3. The mitral valve is normal in structure. Trivial mitral valve regurgitation. No evidence of mitral stenosis.  4. The aortic valve is normal in structure. Aortic valve regurgitation is not visualized. No aortic stenosis is present.  5. Pulmonic valve regurgitation is moderate.  6. The inferior vena cava is normal in size with greater than 50% respiratory variability, suggesting right atrial pressure of 3 mmHg. Comparison(s): No significant change from prior study. Prior images reviewed side by side. FINDINGS  Left Ventricle: Left ventricular ejection fraction, by estimation, is 60 to 65%. The left ventricle has normal function. The left ventricle has no regional wall motion abnormalities. The average left ventricular global longitudinal strain is -17.0 %. The left ventricular internal cavity size was normal in size. There is no left ventricular  hypertrophy. Left  ventricular diastolic parameters are consistent with Grade I diastolic dysfunction (impaired relaxation). Right Ventricle: The right ventricular size is normal. No increase in right ventricular wall thickness. Right ventricular systolic function is normal. Left Atrium: Left atrial size was normal in size. Right Atrium: Right atrial size was normal in size. Pericardium: There is no evidence of pericardial effusion. Mitral Valve: The mitral valve is normal in structure. Normal mobility of the mitral valve leaflets. Trivial mitral valve regurgitation. No evidence of mitral valve stenosis. Tricuspid Valve: The tricuspid valve is normal in structure. Tricuspid valve regurgitation is trivial. No evidence of tricuspid stenosis. Aortic Valve: The aortic valve is normal in structure. Aortic valve regurgitation is not visualized. No aortic stenosis is present. Pulmonic Valve: The pulmonic valve was normal in structure. Pulmonic valve regurgitation is moderate. No evidence of pulmonic stenosis. Aorta: The aortic root is normal in size and structure. Venous: The inferior vena cava is normal in size with greater than 50% respiratory variability, suggesting right atrial pressure of 3 mmHg. IAS/Shunts: No atrial level shunt detected by color flow Doppler.  LEFT VENTRICLE PLAX 2D LVIDd:         3.80 cm  Diastology LVIDs:         2.50 cm  LV e' lateral:   5.88 cm/s LV PW:         0.90 cm  LV E/e' lateral: 15.4 LV IVS:        1.00 cm  LV e' medial:    6.53 cm/s LVOT diam:     2.00 cm  LV E/e' medial:  13.9 LV SV:         64 LV SV Index:   40       2D Longitudinal Strain LVOT Area:     3.14 cm 2D Strain GLS Avg:     -17.0 %  RIGHT VENTRICLE             IVC RV Basal diam:  2.50 cm     IVC diam: 1.60 cm RV S prime:     13.40 cm/s TAPSE (M-mode): 2.2 cm LEFT ATRIUM             Index       RIGHT ATRIUM          Index LA diam:        3.50 cm 2.21 cm/m  RA Area:     9.43 cm LA Vol (A2C):   29.2 ml 18.42 ml/m RA Volume:    21.00 ml 13.25 ml/m LA Vol (A4C):   30.3 ml 19.12 ml/m LA Biplane Vol: 30.8 ml 19.43 ml/m  AORTIC VALVE LVOT Vmax:   90.70 cm/s LVOT Vmean:  54.900 cm/s LVOT VTI:    0.204 m  AORTA Ao Asc diam: 3.25 cm MITRAL VALVE MV Area (PHT): 2.37 cm     SHUNTS MV Decel Time: 320 msec     Systemic VTI:  0.20 m MV E velocity: 90.70 cm/s   Systemic Diam: 2.00 cm MV A velocity: 113.00 cm/s MV E/A ratio:  0.80 Candee Furbish MD Electronically signed by Candee Furbish MD Signature Date/Time: 12/17/2019/10:45:00 AM    Final    VAS US CAROTID  Result Date: 12/19/2019 Carotid Arterial Duplex Study Indications:       Patient was seen at [facility] where they found she had                    a central retinal vein occlusion with some swelling  in her                    right eye and a carotid duplex test was reccommended. Patient                    denies any cerebrovascular symptoms at this time. Risk Factors:      Hypertension, hyperlipidemia, past history of smoking,                    coronary artery disease. Comparison Study:  A carotid duplex performed in 02/2007 at [facility] showed no significant stenosis of the carotid                    arteries. Performing Technologist: Mariane Masters RVT  Examination Guidelines: A complete evaluation includes B-mode imaging, spectral Doppler, color Doppler, and power Doppler as needed of all accessible portions of each vessel. Bilateral testing is considered an integral part of a complete examination. Limited examinations for reoccurring indications may be performed as noted.  Right Carotid Findings: +----------+--------+--------+--------+------------------+------------------+           PSV cm/sEDV cm/sStenosisPlaque DescriptionComments           +----------+--------+--------+--------+------------------+------------------+ CCA Prox  66      8                                                     +----------+--------+--------+--------+------------------+------------------+ CCA Distal57      10                                intimal thickening +----------+--------+--------+--------+------------------+------------------+ ICA Prox  52      10              smooth                               +----------+--------+--------+--------+------------------+------------------+ ICA Mid   67      14      1-39%                     tortuous           +----------+--------+--------+--------+------------------+------------------+ ICA Distal48      17                                tortuous           +----------+--------+--------+--------+------------------+------------------+ ECA       76      0               hypoechoic                           +----------+--------+--------+--------+------------------+------------------+ +----------+--------+-------+----------------+-------------------+           PSV cm/sEDV cmsDescribe        Arm Pressure (mmHG) +----------+--------+-------+----------------+-------------------+ [id number]            Multiphasic, [id number]                 +----------+--------+-------+----------------+-------------------+ +---------+--------+--+--------+--+---------+  VertebralPSV cm/s62EDV cm/s10Antegrade +---------+--------+--+--------+--+---------+ Hypoechoic thyroid nodule with echogenic center visualized at the base of the right neck measuring 1.9 x 1.6 cm. Left Carotid Findings: +----------+--------+--------+--------+----------------------+--------------+           PSV cm/sEDV cm/sStenosisPlaque Description    Comments       +----------+--------+--------+--------+----------------------+--------------+ CCA Prox  61      12                                                   +----------+--------+--------+--------+----------------------+--------------+ CCA Distal54      11                                                    +----------+--------+--------+--------+----------------------+--------------+ ICA Prox  68      17      1-39%   focal and heterogenousminimal plaque +----------+--------+--------+--------+----------------------+--------------+ ICA Mid   60      14                                    tortuous       +----------+--------+--------+--------+----------------------+--------------+ ICA Distal46      14                                    tortuous       +----------+--------+--------+--------+----------------------+--------------+ ECA       67      6               heterogenous                         +----------+--------+--------+--------+----------------------+--------------+ +----------+--------+--------+----------------+-------------------+           PSV cm/sEDV cm/sDescribe        Arm Pressure (mmHG) +----------+--------+--------+----------------+-------------------+ [id number]              Multiphasic, CHE527                 +----------+--------+--------+----------------+-------------------+ +---------+--------+--+--------+--+---------+ VertebralPSV cm/s94EDV cm/s14Antegrade +---------+--------+--+--------+--+---------+   Summary: Right Carotid: Velocities in the right ICA are consistent with a 1-39% stenosis. Left Carotid: Velocities in the left ICA are consistent with a 1-39% stenosis. Vertebrals:  Bilateral vertebral arteries demonstrate antegrade flow. Subclavians: Normal flow hemodynamics were seen in bilateral subclavian              arteries.  Hypoechoic thyroid nodule with echogenic center visualized at the base of the right neck measuring 1.9 x 1.6 cm. Suggest dedicated thyroid ultrasound if clinically indicated. *See table(s) above for measurements and observations.  Electronically signed by Larae Grooms MD on 12/19/2019 at 12:19:31 AM.    Final

## 2019-12-28 NOTE — Telephone Encounter (Signed)
Returned call to [employer].Advised Echo results are not available at present.I will fax Carotid doppler results to fax # [phone].I will send message to Dr.Kelly's RN to fax echo when available.

## 2019-12-28 NOTE — Telephone Encounter (Signed)
Follow Up  Melissa from [facility] is calling in to get the results of the Carotid Doppler and the Echocardiogram faxed over to them at [phone] and [phone]. Please assist.

## 2019-12-29 NOTE — Telephone Encounter (Signed)
Echo results faxed

## 2019-12-31 ENCOUNTER — Encounter: Payer: Self-pay | Admitting: Cardiovascular Disease

## 2019-12-31 ENCOUNTER — Other Ambulatory Visit: Payer: Self-pay

## 2019-12-31 ENCOUNTER — Ambulatory Visit: Payer: Medicare HMO | Admitting: Cardiovascular Disease

## 2019-12-31 VITALS — BP 144/76 | HR 73 | Ht 59.0 in | Wt 150.6 lb

## 2019-12-31 DIAGNOSIS — I2510 Atherosclerotic heart disease of native coronary artery without angina pectoris: Secondary | ICD-10-CM | POA: Diagnosis not present

## 2019-12-31 DIAGNOSIS — I519 Heart disease, unspecified: Secondary | ICD-10-CM

## 2019-12-31 DIAGNOSIS — I5189 Other ill-defined heart diseases: Secondary | ICD-10-CM

## 2019-12-31 DIAGNOSIS — I779 Disorder of arteries and arterioles, unspecified: Secondary | ICD-10-CM

## 2019-12-31 DIAGNOSIS — I10 Essential (primary) hypertension: Secondary | ICD-10-CM

## 2019-12-31 DIAGNOSIS — N184 Chronic kidney disease, stage 4 (severe): Secondary | ICD-10-CM

## 2019-12-31 DIAGNOSIS — R609 Edema, unspecified: Secondary | ICD-10-CM

## 2019-12-31 DIAGNOSIS — R0601 Orthopnea: Secondary | ICD-10-CM

## 2019-12-31 DIAGNOSIS — C3491 Malignant neoplasm of unspecified part of right bronchus or lung: Secondary | ICD-10-CM

## 2019-12-31 DIAGNOSIS — G4731 Primary central sleep apnea: Secondary | ICD-10-CM

## 2019-12-31 DIAGNOSIS — E785 Hyperlipidemia, unspecified: Secondary | ICD-10-CM

## 2019-12-31 MED ORDER — FUROSEMIDE 40 MG PO TABS
40.0000 mg | ORAL_TABLET | Freq: Every day | ORAL | 3 refills | Status: DC
Start: 2019-12-31 — End: 2020-01-18

## 2019-12-31 NOTE — Patient Instructions (Signed)
Medication Instructions:  CHANGE THE WAY YOU ARE TAKING YOUR LASIX-- TAKE 40MG  DAILY  *If you need a refill on your cardiac medications before your next appointment, please call your pharmacy*  Testing/Procedures: THYROID ULTRASOUND   Follow-Up: At [facility], you and your health needs are our priority.  As part of our continuing mission to provide you with exceptional heart care, we have created designated Provider Care Teams.  These Care Teams include your primary Cardiologist (physician) and Advanced Practice Providers (APPs -  Physician Assistants and Nurse Practitioners) who all work together to provide you with the care you need, when you need it.  We recommend signing up for the patient portal called "MyChart".  Sign up information is provided on this After Visit Summary.  MyChart is used to connect with patients for Virtual Visits (Telemedicine).  Patients are able to view lab/test results, encounter notes, upcoming appointments, etc.  Non-urgent messages can be sent to your provider as well.   To learn more about what you can do with MyChart, go to [email or web address].    Your next appointment:   3 month(s)  The format for your next appointment:   In Person  Provider:   Shelva Majestic, MD   Other Instructions [place]

## 2020-01-02 ENCOUNTER — Encounter: Payer: Self-pay | Admitting: Cardiovascular Disease

## 2020-01-02 NOTE — Progress Notes (Signed)
Patient ID: Jo Hendrix, female   DOB: 1926/08/19, 86 y.o.   MRN: [id number]    PCP:  Buzzy Han, MD         HPI: Jo Hendrix is a 86 y.o. female who presents to the office for a 13 month follow-up cardiology evaluation.   Jo Hendrix has a long-standing history of hypertension with documented grade 1 diastolic dysfunction.  She also has a history of complex sleep apnea with documented mild pulmonary hypertension and has been on BiPAP. In October 2008 she underwent cardiac catheterization after developing recurrent episodes of chest pain.  This showed mild coronary obstructive disease with 20% narrowing of the proximal LAD with mid LAD systolic bridging and diffuse luminal irregularities of 40-50% in the RCA.  She has had difficulty with shortness of breath and has been diagnosed with COPD/emphysema.  A cardiopulmonary met test  revealed normal functional status with peak oxygen consumption 105%, but she had an abnormal pulmonary response with no ventilation perfusion mismatch and with plus/minus dead space.  She has been intolerant to statin therapy as well as Zetia.  She continues to use BiPAP for her complex sleep apnea.  When I last saw her 2 months ago she had noticed progressive increasing shortness of breath with activity and had experienced some very mild chest tightness when she is short of breath.  She also admitted to some mild lower extremity edema particularly involving the left leg by the end of the day.    Aa nuclear stress test on 04/08/2015 was low risk.  Post-rest ejection fraction was 71%.  There was normal perfusion.  An echo Doppler study on 04/07/2015 showed an ejection fraction at 55-60%.  There was grade 1 diastolic dysfunction.  There was mild mitral annular calcification with trace MR and trace TR.  She stopped taking statin therapy.  She had blood work done by [employer] 1 Jerry 15 2018, which I reviewed.  TSH was normal at 0.98.  Vitamin D was 68.8.  Lipid  studies were significantly increased with a total cholesterol 239, LDL 142, non-HDL 168, triglycerides 127, and HDL 71.  She continues to use her BiPAP for previously documented complex sleep apnea..   When I  saw her in September 2018 she denied any episodes of chest pain but was experiencing some shortness of breath with activity, which had not significantly changed.  She underwent a follow-up echo Doppler study on 05/17/2017.  This continued to show normal systolic function with an ejection fraction of 60-65%.  There was grade 1 diastolic dysfunction.  There was moderate aortic insufficiency without stenosis, trivial MR, mild TR, moderate pulmonary regurgitation, and mild elevation of PA peak pressure 33 mm.   She and surgery of her left foot with a neurectomy.  She continues to use BiPAP with 100% compliance and her DME company is [employer].  I was able to obtain a download from August 25 3 05/05/2017.  This showed 100% compliance.  AHI was 3.3.  However, her sleep duration was reduced at only 4 hours and 29 minutes per night.  She's had some issues with sleep maintenance.  She typically goes to bed between 11 and 11:30.  Oftentimes she does not put the mask on when she would return from the bathroom.    She presented to [facility] room in December 2018 with complaints of dizziness.  She also had had some issues with blood pressure lability.  Her symptoms had started after she had recovered  from shingles episode.  She was seen in the office in follow-up by [name], NP in January 2019 and was feeling improved.  Due to complaints of prior palpitations associated with dizziness she wore a monitor for 1 week which did not reveal any significant abnormality.  She was in sinus rhythm throughout and had isolated unifocal PVCs.  There were no episodes of atrial fibrillation.    I last saw her in November 2019 at which time she denied any chest pain, PND orthopnea.  She still experience occasional  episodes of shortness of breath and admitted to being tired and at times having no energy.  She was continuing to use BiPAP therapy and has a full facemask with [employer] as her DME company.   She has been seen by [name], NP since my last evaluation.  She underwent a follow-up echo Doppler study on October 02, 2018 which showed normal systolic function with EF at 60 to 65%.  There was grade 1 diastolic dysfunction.  Atrial size was normal.  She had mild MR, mild TR.  Mild PR.  When last seen in April 2020 she continued she to experience some weakness and some fatigue but also admits to occasional sensation of chest fluttering.  She denies any chest tightness.  She has continued to use BiPAP and admits to 100% compliance.  However, with further questioning she is sleeping for inadequate duration.  She typically goes to bed around 11 PM and oftentimes is up at 430.  She does not routinely sleep 8 hours.  Has been monitoring her blood pressure seems to run in the 130s to 140s but occasionally has increased to 160.  Her pulse typically is in the 70s.   Since I last saw her, she was admitted to the hospital in October 2020 with worsening shortness of breath and intermittent chest pain.  CT of her chest and x-ray revealed bilateral lung masses consistent with metastatic spread.  CT of her abdomen and pelvis was negative for metastatic disease.  An MRI of the brain was suboptimal due to motion degradation but mild abnormality was noted in the anterior right parietal lobe indicative of possible small acute area of ischemia versus small mets.  A bronchoscopy in October 2020 showed non-small cell cancer.  She is followed by Dr. 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  She developed secondary fluid retention and also was treated with dexamethasone.    Presently, she denies any chest pain or shortness of breath.  She admits to occasional  lower extremities ankle swelling.  She has been off amlodipine for 1 month.  She also had sustained a bleed behind her right eye.  An echo Doppler study on Dec 17, 2019 showed EF 60 to 65% with grade 1 diastolic dysfunction.  She had  a carotid vascular study on Dec 18, 2019 which showed mild bilateral carotid stenoses in the 1 to 39% range.  Incidentally, there was a hypoechoic thyroid nodule with echogenic center visualized at the base of the right neck measuring 1.9 x 1.6 cm and dedicated thyroid ultrasound was suggested.  Past Medical History:  Diagnosis Date  . ACE-inhibitor cough   . COPD (chronic obstructive pulmonary disease) ([place]   . Diastolic dysfunction   . History of tobacco use    smoked 25 years  . Hypertension   . Mild CAD   . Mild pulmonary hypertension ([place])   . OSA (obstructive  sleep apnea)    Sleep Study 04/07/2007 - AHI during total sleep 21.30/hr, during REM 20.16/hr - BiPAP auto servo-ventilation unit    Past Surgical History:  Procedure Laterality Date  . benign lesion removed on lung  2000  . BLADDER REPAIR    . CARDIAC CATHETERIZATION  05/20/2007   mild coronary obstructive disease with 20% narrowing in prox LAD, diffuse luminal irregularity of 40-50% in mid RCA (Dr. Corky Downs)  . Cardiopulmonary Met Test  01/28/2012   with PFTs - FEV1 & FEV1/VC WNL, VC WNL, DLCO WNL; abnormal pulmonary response  . Carotid Doppler  04/2011   normal patency  . CARPAL TUNNEL RELEASE     x2  . CATARACT EXTRACTION, BILATERAL    . ENDOBRONCHIAL ULTRASOUND N/A 06/12/2019   Procedure: ENDOBRONCHIAL ULTRASOUND;  Surgeon: Candee Furbish, MD;  Location: [facility] ENDOSCOPY;  Service: Endoscopy;  Laterality: N/A;  . FINE NEEDLE ASPIRATION  06/12/2019   Procedure: FINE NEEDLE ASPIRATION;  Surgeon: Candee Furbish, MD;  Location: [facility] ENDOSCOPY;  Service: Endoscopy;;  . GALLBLADDER SURGERY    . NM MYOCAR PERF WALL MOTION  09/2011   lexiscan myoview - normal perfusion, EF 77%, low risk  . Renal  Doppler  05/2007   normal renal arteries   . TONSILLECTOMY    . TRANSTHORACIC ECHOCARDIOGRAM  2013   EF 50-55%; mild MR; mild TR; mild pulm valve regurg; aortic root sclerosis/calcification  . VAGINAL HYSTERECTOMY    . VARICOSE VEIN SURGERY    . VIDEO BRONCHOSCOPY  06/12/2019   Procedure: VIDEO BRONCHOSCOPY;  Surgeon: Candee Furbish, MD;  Location: [facility];  Service: Endoscopy;;    Allergies  Allergen Reactions  . Acetaminophen Anaphylaxis and Other (See Comments)    Difficulty urinating Difficulty urinating   . Benadryl [Diphenhydramine] Other (See Comments)    Tongue gets thick, feel jittery  . Zetia [Ezetimibe] Other (See Comments)    Chest pressure  . Gabapentin     dizziness  . Hydrochlorothiazide Nausea And Vomiting    Dizziness  . Ace Inhibitors Cough  . Codeine Other (See Comments)    difficulty urinating  . Erythromycin Other (See Comments)    DOESN'T REMEMBER   . Etodolac Nausea Only  . Ivp Dye [Iodinated Diagnostic Agents] Hives    Ok to proceed 13 hour prep without benadryl per Radiologist (Dr. Janeece Fitting 12/21/19 hp)     Current Outpatient Medications  Medication Sig Dispense Refill  . acetaminophen (TYLENOL) 325 MG tablet Take 650 mg by mouth every 6 (six) hours as needed for mild pain or headache.    Marland Kitchen aspirin 81 MG tablet Take 81 mg by mouth daily.    . Calcium 1500 MG tablet Take 1,500 mg by mouth daily.    . capmatinib (TABRECTA) 150 MG tablet Take 250 mg by mouth 2 (two) times daily.    . cholecalciferol (VITAMIN D) 1000 UNITS tablet Take 1,000 Units by mouth daily. Take 1 tab daily    . furosemide (LASIX) 40 MG tablet Take 1 tablet (40 mg total) by mouth daily. 90 tablet 3  . NON FORMULARY at bedtime. BiPAP    . Omega-3 Fatty Acids (FISH OIL) 1000 MG CAPS Take 1,000 mg by mouth daily.     . ondansetron (ZOFRAN) 8 MG tablet Take 1 tablet (8 mg total) by mouth every 8 (eight) hours as needed for nausea or vomiting. 30 tablet 2  . polyvinyl alcohol  (LIQUIFILM TEARS) 1.4 % ophthalmic solution Place 1 drop into  both eyes as needed for dry eyes.    Marland Kitchen prochlorperazine (COMPAZINE) 10 MG tablet Take 1 tablet (10 mg total) by mouth every 6 (six) hours as needed for nausea or vomiting. 30 tablet 3  . rosuvastatin (CRESTOR) 5 MG tablet Take 1 tablet (5 mg total) by mouth every other day. 45 tablet 2  . RESTASIS 0.05 % ophthalmic emulsion Place 1 drop into both eyes 2 (two) times daily. Use 1 drop in each eye twice a day     No current facility-administered medications for this visit.    Social History   Socioeconomic History  . Marital status: Widowed    Spouse name: Not on file  . Number of children: 3  . Years of education: Not on file  . Highest education level: Not on file  Occupational History  . Occupation: retired  Tobacco Use  . Smoking status: Former Smoker    Packs/day: 0.50    Years: 25.00    Pack years: 12.50    Types: Cigarettes    Quit date: 08/13/1984    Years since quitting: 35.4  . Smokeless tobacco: Never Used  Substance and Sexual Activity  . Alcohol use: No  . Drug use: No  . Sexual activity: Not on file  Other Topics Concern  . Not on file  Social History Narrative  . Not on file   Social Determinants of Health   Financial Resource Strain:   . Difficulty of Paying Living Expenses:   Food Insecurity:   . Worried About Charity fundraiser in the Last Year:   . Arboriculturist in the Last Year:   Transportation Needs:   . Film/video editor (Medical):   Marland Kitchen Lack of Transportation (Non-Medical):   Physical Activity:   . Days of Exercise per Week:   . Minutes of Exercise per Session:   Stress:   . Feeling of Stress :   Social Connections:   . Frequency of Communication with Friends and Family:   . Frequency of Social Gatherings with Friends and Family:   . Attends Religious Services:   . Active Member of Clubs or Organizations:   . Attends Archivist Meetings:   Marland Kitchen Marital Status:    Intimate Partner Violence:   . Fear of Current or Ex-Partner:   . Emotionally Abused:   Marland Kitchen Physically Abused:   . Sexually Abused:     Socially, she is widowed, has 3 children, 8 grandchildren 5 great-grandchildren.  There is a remote tobacco history but she quit many years ago.  Family History  Problem Relation Age of Onset  . Lung cancer Mother   . Suicidality Father   . Cancer Sister   . Breast cancer Daughter   . 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 GI: Negative; No nausea, vomiting, diarrhea, or abdominal pain GU: Negative; No dysuria, hematuria, or difficulty voiding Musculoskeletal: Negative; no myalgias, joint pain, or weakness Hematologic/Oncology: Negative; no easy bruising, bleeding Endocrine: Negative; no heat/cold intolerance; no diabetes Neuro: Negative; no changes in balance, headaches Skin: Negative; No rashes or skin lesions Psychiatric: Negative; No behavioral problems, depression Sleep: Positive for complex sleep apnea, on therapy; No snoring,  daytime sleepiness, hypersomnolence, bruxism, restless legs, hypnogognic hallucinations, no cataplexy Other comprehensive 14 point system review is negative.   PE BP (!) 144/76   Pulse 73   Ht _0  (1.499 m)   Wt 150 lb 9.6 oz (68.3 kg)   SpO2 100%   BMI 30.42 kg/m    Repeat blood pressure by me 144/74  Wt Readings from Last 3 Encounters:  12/31/19 150 lb 9.6 oz (68.3 kg)  12/23/19 143 lb 9.6 oz (65.1 kg)  11/25/19 140 lb (63.5 kg)   General: Alert, oriented, no distress.  Skin: normal turgor, no rashes, warm and dry HEENT: Normocephalic, atraumatic.  Pupils equal round and reactive to light; sclera anicteric; extraocular muscles intact;  Nose without nasal septal hypertrophy Mouth/Parynx benign; Mallinpatti scale 3 Neck: Questionable palpable nodule base right neck; no JVD, no carotid bruits; normal carotid upstroke Lungs: clear to ausculatation and percussion; no wheezing or rales Chest wall: without tenderness to palpitation Heart: PMI not displaced, RRR, s1 s2 normal, 1/6 systolic murmur, no diastolic murmur, no rubs, gallops, thrills, or heaves Abdomen: soft, nontender; no hepatosplenomehaly, BS+; abdominal aorta nontender and not dilated by palpation. Back: no CVA tenderness Pulses 2+ Musculoskeletal: full range of motion, normal strength, no joint deformities Extremities: 1+ pretibial and ankle swelling, no clubbing cyanosis , Homan's sign negative  Neurologic: grossly nonfocal; Cranial nerves grossly wnl Psychologic: Normal mood and affect   ECG (independently read by me): Normal sinus rhythm at 68 bpm.  Normal intervals.  Poor R wave progression anteriorly.  April 2020 ECG (independently read by me): Normal sinus rhythm at 86 bpm.  Poor anterior R wave progression V1 through V3.  No ectopy.  Normal intervals.  November 2019 ECG (independently read by me): Normal sinus rhythm at 72 bpm.  Poor anterior R wave progression.  Normal intervals, no ectopy  November 2018 ECG (independently read by me): Normal sinus rhythm at 68 bpm with isolated PVC, nonspecific T changes.  QTc interval 459 ms.  September 2018 ECG (independently read by me): Normal sinus rhythm at 76 bpm, nonspecific T wave changes.  October 2017 ECG (independently read by me): Normal sinus rhythm at 66 bpm.  Poor R wave progression.  Normal intervals.  October 2016 ECG (independently read by me): Normal sinus rhythm at 66 bpm.  Nonspecific T changes.  September 2015 ECG (independently read by me): Normal sinus rhythm at 62 beats per minute.  Poor progression  anteriorly.  Nonspecific ST changes  12/03/2013 ECG (independently read by me): Normal sinus rhythm at 62 beats per minute.  Nonspecific ST changes.  No ectopy  LABS:  BMP Latest Ref Rng & Units 12/23/2019 12/09/2019 11/25/2019  Glucose 70 - 99 mg/dL 150(H) 90 111(H)  BUN 8 - 23 mg/dL 35(H) 14 30(H)  Creatinine 0.44 - 1.00 mg/dL 1.86(H) 1.20(H) 1.76(H)  BUN/Creat Ratio 12 - 28 - - -  Sodium 135 - 145 mmol/L 142 139 139  Potassium 3.5 - 5.1 mmol/L 4.0 4.4 3.8  Chloride 98 - 111 mmol/L 100 109 101  CO2 22 - 32 mmol/L _1 Calcium 8.9 - 10.3 mg/dL 9.0 8.5(L) 8.6(L)   Hepatic Function Latest Ref Rng & Units 12/23/2019 12/09/2019 11/25/2019  Total Protein 6.5 - 8.1 g/dL 6.9 6.0(L) 6.6  Albumin 3.5 - 5.0 g/dL 3.2(L) 2.7(L) 3.0(L)  AST 15 - 41 U/L _2 ALT 0 - 44 U/L _3 Alk Phosphatase 38 - 126 U/L 90 88 90  Total Bilirubin 0.3 - 1.2 mg/dL 0.5 0.5 0.6   CBC Latest Ref Rng & Units 12/23/2019 12/09/2019 11/25/2019  WBC 4.0 - 10.5 K/uL 14.7(H) 10.8(H) 11.0(H)  Hemoglobin 12.0 - 15.0 g/dL 14.7 12.2 12.4  Hematocrit 36.0 - 46.0 % 45.4 37.7 36.8  Platelets 150 - 400 K/uL 335 313 282   Lab Results  Component Value Date   MCV 96.0 12/23/2019   MCV 95.2 12/09/2019   MCV 90.6 11/25/2019   Lab Results  Component Value Date   TSH 1.131 11/04/2019   No results found for: HGBA1C  Lipid Panel     Component Value Date/Time   CHOL 214 (H) 05/03/2017 1229   TRIG 97 05/03/2017 1229   HDL 63 05/03/2017 1229   CHOLHDL 3.4 05/03/2017 1229   CHOLHDL 2.9 01/16/2017 0817   VLDL 31 (H) 01/16/2017 0817   LDLCALC 132 (H) 05/03/2017 1229    RADIOLOGY: No results found.  IMPRESSION:  1. Fluid retention with bilateral lower extremity edema   2. Essential hypertension   3. Mild CAD   4. Mild carotid artery disease ([place])   5. Complex sleep apnea syndrome   6. Non-small cell cancer of right lung ([place])   7. Orthopnea   8. Grade I diastolic dysfunction   9. Hyperlipidemia, unspecified  hyperlipidemia type     ASSESSMENT AND PLAN: Ms. Quadasia Newsham is a very pleasant young appearing 86 year-old female who has a long-standing history of hypertension and has complex sleep apnea on BiPAP therapy.  She has a history of prior statin intolerance and unfortunately was found to have developed stage IIIb adenocarcinoma the right lung, unresectable with evidence for metastases.  She has been undergoing treatment with capmatinib.  She previously had been on amlodipine for hypertension but stopped taking this secondary to progressive lower extremity edema.  She has been off therapy for 1 month.  Remotely she had been on ARB therapy.  She has noticed occasional bilateral extremity swelling and on exam today there is 1+ pretibial edema and ankle swelling despite being off amlodipine.  She has been taking furosemide 40 mg approximately 2-3 times per week.  I have suggested she increase this to 40 mg daily.  She also has developed stage IV chronic kidney disease and most recent serum creatinine from Dec 23, 2019 was 1.86 with an estimated GFR of 23.  She is not having any anginal symptomatology and previously was noted to have mild nonobstructive CAD.  A prior cardiopulmonary met test revealed abnormal pulmonary response with no ventilation/perfusion mismatch but with plus minus dead space.  Her blood pressure today is mildly increased.  This should improve with the increase furosemide dose.  He apparently has been able to tolerate rosuvastatin now 5 mg every other day.  She continues to be on BiPAP therapy.  I reviewed her recent echo Doppler study as well as her carotid study.  I will schedule her for a dedicated thyroid ultrasound per recommendation in the carotid study to further evaluate the hypoechoic thyroid nodule at the base of the right neck measuring 1.9 x 1.6 cm.  I will see her in 3 months for reevaluation.    Troy Sine, MD, [facility]  01/02/2020 12:50 PM

## 2020-01-08 ENCOUNTER — Other Ambulatory Visit: Payer: Self-pay

## 2020-01-08 ENCOUNTER — Inpatient Hospital Stay: Payer: Medicare HMO

## 2020-01-08 ENCOUNTER — Inpatient Hospital Stay: Payer: Medicare HMO | Admitting: Hematology and Oncology

## 2020-01-08 VITALS — BP 141/70 | HR 85 | Temp 98.1°F | Resp 18 | Ht 59.0 in | Wt 149.7 lb

## 2020-01-08 DIAGNOSIS — R609 Edema, unspecified: Secondary | ICD-10-CM

## 2020-01-08 DIAGNOSIS — C3481 Malignant neoplasm of overlapping sites of right bronchus and lung: Secondary | ICD-10-CM | POA: Diagnosis not present

## 2020-01-08 DIAGNOSIS — C3491 Malignant neoplasm of unspecified part of right bronchus or lung: Secondary | ICD-10-CM

## 2020-01-08 LAB — CBC WITH DIFFERENTIAL (CANCER CENTER ONLY)
Abs Immature Granulocytes: 0.05 10*3/uL (ref 0.00–0.07)
Basophils Absolute: 0 10*3/uL (ref 0.0–0.1)
Basophils Relative: 0 %
Eosinophils Absolute: 0.1 10*3/uL (ref 0.0–0.5)
Eosinophils Relative: 1 %
HCT: 38.5 % (ref 36.0–46.0)
Hemoglobin: 12.7 g/dL (ref 12.0–15.0)
Immature Granulocytes: 1 %
Lymphocytes Relative: 25 %
Lymphs Abs: 2.4 10*3/uL (ref 0.7–4.0)
MCH: 31.1 pg (ref 26.0–34.0)
MCHC: 33 g/dL (ref 30.0–36.0)
MCV: 94.1 fL (ref 80.0–100.0)
Monocytes Absolute: 0.6 10*3/uL (ref 0.1–1.0)
Monocytes Relative: 6 %
Neutro Abs: 6.6 10*3/uL (ref 1.7–7.7)
Neutrophils Relative %: 67 %
Platelet Count: 254 10*3/uL (ref 150–400)
RBC: 4.09 MIL/uL (ref 3.87–5.11)
RDW: 13.4 % (ref 11.5–15.5)
WBC Count: 9.9 10*3/uL (ref 4.0–10.5)
nRBC: 0 % (ref 0.0–0.2)

## 2020-01-08 LAB — CMP (CANCER CENTER ONLY)
ALT: 18 U/L (ref 0–44)
AST: 18 U/L (ref 15–41)
Albumin: 2.5 g/dL — ABNORMAL LOW (ref 3.5–5.0)
Alkaline Phosphatase: 89 U/L (ref 38–126)
Anion gap: 11 (ref 5–15)
BUN: 22 mg/dL (ref 8–23)
CO2: 28 mmol/L (ref 22–32)
Calcium: 8.4 mg/dL — ABNORMAL LOW (ref 8.9–10.3)
Chloride: 102 mmol/L (ref 98–111)
Creatinine: 1.67 mg/dL — ABNORMAL HIGH (ref 0.44–1.00)
GFR, Est AFR Am: 31 mL/min — ABNORMAL LOW (ref >60)
GFR, Estimated: 26 mL/min — ABNORMAL LOW (ref >60)
Glucose, Bld: 109 mg/dL — ABNORMAL HIGH (ref 70–99)
Potassium: 3.7 mmol/L (ref 3.5–5.1)
Sodium: 141 mmol/L (ref 135–145)
Total Bilirubin: 0.3 mg/dL (ref 0.3–1.2)
Total Protein: 5.6 g/dL — ABNORMAL LOW (ref 6.5–8.1)

## 2020-01-08 NOTE — Progress Notes (Signed)
[place] [phone]   Fax:([phone]  PROGRESS NOTE  Patient Care Team: Buzzy Han, MD as PCP - General (Family Medicine) Troy Sine, MD as PCP - Cardiology (Cardiology)  Hematological/Oncological History HEME/ONC OVERVIEW: 1. 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 A large R lung mass extending from the RUL to RLL with mediastinal invasion, R hilar denopathy on CT; no mets in abdomen ? Questionable small foci in L frontal and R parietal lobes on MRI brain, limited due to motion degradation; repeat MRI still limited by  ? Bronch w/ R lung bx, c/w adenocarcinoma; MET exon 14 splice variant deletion+ ? Large FDG-avid R lung mass with extension to the R hilum and paratracheal LN involvement; no distant mets  -07/2019 - present: palliative capmatinib  ? 12/2019: improving RUL and RLL malignancy and nodal metastasis; no new or metastatic disease   TREATMENT SUMMARY:  07/22/2019 - present: capmatinib, currently on 367m BID   Interval History:  Jo Hendrix 86 y.o. female with medical history significant for metastatic adenocarcinoma of the lung who presents for a follow up visit. The patient's last visit was on 12/23/2019 with Dr. ZMaylon Peppers In the interim since the last visit the patient was seen by cardiology and recommended to take 436mPO lasix daily for her edema.  On exam today Jo Hendrix notes that she has been doing quite well other than the edema she has been having throughout her body.  She reports that she is having swelling of her hands and arms as well as her lower extremities.  She has not noticed any edema of her abdomen and denies having any issues with cough or shortness of breath.  She reports that her energy levels are okay.  Her appetite has been good and she has been eating well without difficulty.  She has had an increase in her weight since her  last visit with Dr. ZhMaylon Peppersrom 143 to 149 pounds, however I am concerned that this might represent only fluid weight gain.. She currently denies any issues with fevers, chills, sweats, nausea, vomiting or diarrhea.  A full 10 point ROS is listed below.  MEDICAL HISTORY:  Past Medical History:  Diagnosis Date  . ACE-inhibitor cough   . COPD (chronic obstructive pulmonary disease) ([facility]  . Diastolic dysfunction   . History of tobacco use    smoked 25 years  . Hypertension   . Mild CAD   . Mild pulmonary hypertension ([facility]  . OSA (obstructive sleep apnea)    Sleep Study 04/07/2007 - AHI during total sleep 21.30/hr, during REM 20.16/hr - BiPAP auto servo-ventilation unit    SURGICAL HISTORY: Past Surgical History:  Procedure Laterality Date  . benign lesion removed on lung  2000  . BLADDER REPAIR    . CARDIAC CATHETERIZATION  05/20/2007   mild coronary obstructive disease with 20% narrowing in prox LAD, diffuse luminal irregularity of 40-50% in mid RCA (Dr. T.Corky Downs . Cardiopulmonary Met Test  01/28/2012   with PFTs - FEV1 & FEV1/VC WNL, VC WNL, DLCO WNL; abnormal pulmonary response  . Carotid Doppler  04/2011   normal patency  . CARPAL TUNNEL RELEASE     x2  . CATARACT EXTRACTION, BILATERAL    . ENDOBRONCHIAL ULTRASOUND N/A 06/12/2019   Procedure: ENDOBRONCHIAL ULTRASOUND;  Surgeon: SmCandee FurbishMD;  Location: [facility];  Service: Endoscopy;  Laterality: N/A;  .  FINE NEEDLE ASPIRATION  06/12/2019   Procedure: FINE NEEDLE ASPIRATION;  Surgeon: Candee Furbish, MD;  Location: [facility];  Service: Endoscopy;;  . GALLBLADDER SURGERY    . NM MYOCAR PERF WALL MOTION  09/2011   lexiscan myoview - normal perfusion, EF 77%, low risk  . Renal Doppler  05/2007   normal renal arteries   . TONSILLECTOMY    . TRANSTHORACIC ECHOCARDIOGRAM  2013   EF 50-55%; mild MR; mild TR; mild pulm valve regurg; aortic root sclerosis/calcification  . VAGINAL HYSTERECTOMY    . VARICOSE VEIN  SURGERY    . VIDEO BRONCHOSCOPY  06/12/2019   Procedure: VIDEO BRONCHOSCOPY;  Surgeon: Candee Furbish, MD;  Location: [facility];  Service: Endoscopy;;    SOCIAL HISTORY: Social History   Socioeconomic History  . Marital status: Widowed    Spouse name: Not on file  . Number of children: 3  . Years of education: Not on file  . Highest education level: Not on file  Occupational History  . Occupation: retired  Tobacco Use  . Smoking status: Former Smoker    Packs/day: 0.50    Years: 25.00    Pack years: 12.50    Types: Cigarettes    Quit date: 08/13/1984    Years since quitting: 35.4  . Smokeless tobacco: Never Used  Substance and Sexual Activity  . Alcohol use: No  . Drug use: No  . Sexual activity: Not on file  Other Topics Concern  . Not on file  Social History Narrative  . Not on file   Social Determinants of Health   Financial Resource Strain:   . Difficulty of Paying Living Expenses:   Food Insecurity:   . Worried About Charity fundraiser in the Last Year:   . Arboriculturist in the Last Year:   Transportation Needs:   . Film/video editor (Medical):   Marland Kitchen Lack of Transportation (Non-Medical):   Physical Activity:   . Days of Exercise per Week:   . Minutes of Exercise per Session:   Stress:   . Feeling of Stress :   Social Connections:   . Frequency of Communication with Friends and Family:   . Frequency of Social Gatherings with Friends and Family:   . Attends Religious Services:   . Active Member of Clubs or Organizations:   . Attends Archivist Meetings:   Marland Kitchen Marital Status:   Intimate Partner Violence:   . Fear of Current or Ex-Partner:   . Emotionally Abused:   Marland Kitchen Physically Abused:   . Sexually Abused:     FAMILY HISTORY: Family History  Problem Relation Age of Onset  . Lung cancer Mother   . Suicidality Father   . Cancer Sister   . Breast cancer Daughter   . Stroke Sister     ALLERGIES:  is allergic to acetaminophen;  benadryl [diphenhydramine]; zetia [ezetimibe]; gabapentin; hydrochlorothiazide; ace inhibitors; codeine; erythromycin; etodolac; and ivp dye [iodinated diagnostic agents].  MEDICATIONS:  Current Outpatient Medications  Medication Sig Dispense Refill  . acetaminophen (TYLENOL) 325 MG tablet Take 650 mg by mouth every 6 (six) hours as needed for mild pain or headache.    Marland Kitchen aspirin 81 MG tablet Take 81 mg by mouth daily.    . Calcium 1500 MG tablet Take 1,500 mg by mouth daily.    . capmatinib (TABRECTA) 150 MG tablet Take 250 mg by mouth 2 (two) times daily.    . cholecalciferol (VITAMIN D) 1000  UNITS tablet Take 1,000 Units by mouth daily. Take 1 tab daily    . furosemide (LASIX) 40 MG tablet Take 1 tablet (40 mg total) by mouth daily. 90 tablet 3  . NON FORMULARY at bedtime. BiPAP    . Omega-3 Fatty Acids (FISH OIL) 1000 MG CAPS Take 1,000 mg by mouth daily.     . ondansetron (ZOFRAN) 8 MG tablet Take 1 tablet (8 mg total) by mouth every 8 (eight) hours as needed for nausea or vomiting. 30 tablet 2  . polyvinyl alcohol (LIQUIFILM TEARS) 1.4 % ophthalmic solution Place 1 drop into both eyes as needed for dry eyes.    Marland Kitchen prochlorperazine (COMPAZINE) 10 MG tablet Take 1 tablet (10 mg total) by mouth every 6 (six) hours as needed for nausea or vomiting. 30 tablet 3  . RESTASIS 0.05 % ophthalmic emulsion Place 1 drop into both eyes 2 (two) times daily. Use 1 drop in each eye twice a day    . rosuvastatin (CRESTOR) 5 MG tablet Take 1 tablet (5 mg total) by mouth every other day. 45 tablet 2   No current facility-administered medications for this visit.    REVIEW OF SYSTEMS:   Constitutional: ( - ) fevers, ( - )  chills , ( - ) night sweats Eyes: ( - ) blurriness of vision, ( - ) double vision, ( - ) watery eyes Ears, nose, mouth, throat, and face: ( - ) mucositis, ( - ) sore throat Respiratory: ( - ) cough, ( - ) dyspnea, ( - ) wheezes Cardiovascular: ( - ) palpitation, ( - ) chest discomfort, (  - ) lower extremity swelling Gastrointestinal:  ( - ) nausea, ( - ) heartburn, ( - ) change in bowel habits Skin: ( - ) abnormal skin rashes Lymphatics: ( - ) new lymphadenopathy, ( - ) easy bruising Neurological: ( - ) numbness, ( - ) tingling, ( - ) new weaknesses Behavioral/Psych: ( - ) mood change, ( - ) new changes  All other systems were reviewed with the patient and are negative.  PHYSICAL EXAMINATION: ECOG PERFORMANCE STATUS: 1 - Symptomatic but completely ambulatory  Vitals:   01/08/20 1420  BP: (!) 141/70  Pulse: 85  Resp: 18  Temp: 98.1 F (36.7 C)  SpO2: 97%   Filed Weights   01/08/20 1420  Weight: 149 lb 11.2 oz (67.9 kg)    GENERAL: well appearing elderly Caucasian female (appears considerably younger than state age) alert, no distress and comfortable SKIN: skin color, texture, turgor are normal, no rashes or significant lesions EYES: conjunctiva are pink and non-injected, sclera clearl LUNGS: clear to auscultation and percussion with normal breathing effort HEART: regular rate & rhythm and no murmurs and + 2 pitting edema in LE bilaterally. +1 in UE.  Musculoskeletal: no cyanosis of digits and no clubbing  PSYCH: alert & oriented x 3, fluent speech NEURO: no focal motor/sensory deficits  LABORATORY DATA:  I have reviewed the data as listed CBC Latest Ref Rng & Units 01/08/2020 12/23/2019 12/09/2019  WBC 4.0 - 10.5 K/uL 9.9 14.7(H) 10.8(H)  Hemoglobin 12.0 - 15.0 g/dL 12.7 14.7 12.2  Hematocrit 36.0 - 46.0 % 38.5 45.4 37.7  Platelets 150 - 400 K/uL 254 335 313    CMP Latest Ref Rng & Units 01/08/2020 12/23/2019 12/09/2019  Glucose 70 - 99 mg/dL 109(H) 150(H) 90  BUN 8 - 23 mg/dL 22 35(H) 14  Creatinine 0.44 - 1.00 mg/dL 1.67(H) 1.86(H) 1.20(H)  Sodium 135 -  145 mmol/L 141 142 139  Potassium 3.5 - 5.1 mmol/L 3.7 4.0 4.4  Chloride 98 - 111 mmol/L 102 100 109  CO2 22 - 32 mmol/L _0 Calcium 8.9 - 10.3 mg/dL 8.4(L) 9.0 8.5(L)  Total Protein 6.5 - 8.1 g/dL  5.6(L) 6.9 6.0(L)  Total Bilirubin 0.3 - 1.2 mg/dL 0.3 0.5 0.5  Alkaline Phos 38 - 126 U/L 89 90 88  AST 15 - 41 U/L _1 ALT 0 - 44 U/L _2 RADIOGRAPHIC STUDIES: DG Chest 2 View  Result Date: 12/17/2019 CLINICAL DATA:  Dyspnea on exertion. Orthopnea. EXAM: CHEST - 2 VIEW COMPARISON:  09/16/2019 and 09/14/2019 chest CT FINDINGS: Heart size is normal. Stable changes within the RIGHT UPPER lobe. There is perihilar peribronchial thickening, stable in appearance. Lungs are clear otherwise. No new consolidations or pleural effusions. No pulmonary edema. IMPRESSION: Stable appearance of the chest. Electronically Signed   By: Nolon Nations M.D.   On: 12/17/2019 10:43   CT CHEST W CONTRAST  Result Date: 12/21/2019 CLINICAL DATA:  Follow-up right lung squamous cell carcinoma. Undergoing chemotherapy. EXAM: CT CHEST, ABDOMEN, AND PELVIS WITH CONTRAST TECHNIQUE: Multidetector CT imaging of the chest, abdomen and pelvis was performed following the standard protocol during bolus administration of intravenous contrast. The patient received a standard 13 hour steroid prep prior to the exam, and experienced no allergic symptoms. CONTRAST:  168m OMNIPAQUE IOHEXOL 300 MG/ML  SOLN COMPARISON:  09/14/2019 FINDINGS: CT CHEST FINDINGS Cardiovascular: No acute findings. Aortic and coronary artery atherosclerosis noted. Mediastinum/Lymph Nodes: Abnormal soft tissue density in the right suprahilar/right paraesophageal region has decreased since previous study, currently measuring 1.3 cm short axis on image 18/2, compared to 2.0 cm previously. 1.3 cm right thyroid lobe nodule is stable. Lungs/Pleura: A rounded masslike opacity with central lucency is again seen in the central right upper lobe, and currently measures 5.1 x 4.9 cm on image 31/6, compared to 7.9 x 6.7 cm at same level previously. Ill-defined opacity in the superior segment of the right lower lobe shows no significant change. No new or enlarging  areas of pulmonary opacity are seen in either lung. Mild emphysema is again noted. No evidence of pleural effusion. Musculoskeletal:  No suspicious bone lesions identified. CT ABDOMEN AND PELVIS FINDINGS Hepatobiliary: No masses identified. Prior cholecystectomy again noted. Mild biliary ductal dilatation is stable. Pancreas:  No mass or inflammatory changes. Spleen:  Within normal limits in size and appearance. Adrenals/Urinary tract: Normal adrenal glands. Small renal cysts remains stable. 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 No evidence of ureteral calculi or hydronephrosis. Unremarkable unopacified urinary bladder. Stomach/Bowel: No evidence of obstruction, inflammatory process, or abnormal fluid collections. Diverticulosis is seen mainly involving the descending and sigmoid colon, however there is no evidence of diverticulitis. Vascular/Lymphatic: No pathologically enlarged lymph nodes identified. No abdominal aortic aneurysm. Aortic atherosclerosis noted. Reproductive: Prior hysterectomy noted. Adnexal regions are unremarkable in appearance. Other:  None. Musculoskeletal:  No suspicious bone lesions identified. IMPRESSION: 1. Decreased size of central right upper lobe masslike opacity. 2. Decreased right suprahilar/right paraesophageal lymphadenopathy. 3. Stable ill-defined opacity in superior segment of right lower lobe. 4. No new or progressive disease identified within the chest. No evidence of abdominal or pelvic metastatic disease. 5. Colonic diverticulosis, without radiographic evidence of diverticulitis. Aortic Atherosclerosis (ICD10-I70.0) and Emphysema (ICD10-J43.9). Electronically Signed   By: JMarlaine HindM.D.   On: 12/21/2019 16:07   CT  ABDOMEN PELVIS W CONTRAST  Result Date: 12/21/2019 CLINICAL DATA:  Follow-up right lung squamous cell carcinoma. Undergoing chemotherapy. EXAM: CT CHEST, ABDOMEN, AND PELVIS WITH CONTRAST TECHNIQUE: Multidetector CT  imaging of the chest, abdomen and pelvis was performed following the standard protocol during bolus administration of intravenous contrast. The patient received a standard 13 hour steroid prep prior to the exam, and experienced no allergic symptoms. CONTRAST:  193m OMNIPAQUE IOHEXOL 300 MG/ML  SOLN COMPARISON:  09/14/2019 FINDINGS: CT CHEST FINDINGS Cardiovascular: No acute findings. Aortic and coronary artery atherosclerosis noted. Mediastinum/Lymph Nodes: Abnormal soft tissue density in the right suprahilar/right paraesophageal region has decreased since previous study, currently measuring 1.3 cm short axis on image 18/2, compared to 2.0 cm previously. 1.3 cm right thyroid lobe nodule is stable. Lungs/Pleura: A rounded masslike opacity with central lucency is again seen in the central right upper lobe, and currently measures 5.1 x 4.9 cm on image 31/6, compared to 7.9 x 6.7 cm at same level previously. Ill-defined opacity in the superior segment of the right lower lobe shows no significant change. No new or enlarging areas of pulmonary opacity are seen in either lung. Mild emphysema is again noted. No evidence of pleural effusion. Musculoskeletal:  No suspicious bone lesions identified. CT ABDOMEN AND PELVIS FINDINGS Hepatobiliary: No masses identified. Prior cholecystectomy again noted. Mild biliary ductal dilatation is stable. Pancreas:  No mass or inflammatory changes. Spleen:  Within normal limits in size and appearance. Adrenals/Urinary tract: Normal adrenal glands. Small renal cysts remains stable. 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 No evidence of ureteral calculi or hydronephrosis. Unremarkable unopacified urinary bladder. Stomach/Bowel: No evidence of obstruction, inflammatory process, or abnormal fluid collections. Diverticulosis is seen mainly involving the descending and sigmoid colon, however there is no evidence of diverticulitis. Vascular/Lymphatic: No  pathologically enlarged lymph nodes identified. No abdominal aortic aneurysm. Aortic atherosclerosis noted. Reproductive: Prior hysterectomy noted. Adnexal regions are unremarkable in appearance. Other:  None. Musculoskeletal:  No suspicious bone lesions identified. IMPRESSION: 1. Decreased size of central right upper lobe masslike opacity. 2. Decreased right suprahilar/right paraesophageal lymphadenopathy. 3. Stable ill-defined opacity in superior segment of right lower lobe. 4. No new or progressive disease identified within the chest. No evidence of abdominal or pelvic metastatic disease. 5. Colonic diverticulosis, without radiographic evidence of diverticulitis. Aortic Atherosclerosis (ICD10-I70.0) and Emphysema (ICD10-J43.9). Electronically Signed   By: JMarlaine HindM.D.   On: 12/21/2019 16:07   ECHOCARDIOGRAM COMPLETE  Result Date: 12/17/2019    ECHOCARDIOGRAM REPORT   Patient Name:   Jo Hendrix Date of Exam: 12/17/2019 Medical Rec #:  [id number]    Height:       59.0 in Accession #:    [id number]   Weight:       140.0 lb Date of Birth:  [birth year]    BSA:          1.585 m Patient Age:    89years      BP:           136/70 mmHg Patient Gender: F             HR:           70 bpm. Exam Location:  Outpatient Procedure: 2D Echo, Cardiac Doppler, Color Doppler and Strain Analysis Indications:    Z51.11 Encounter for antineoplastic chemotheraphy  History:        Patient has prior history of Echocardiogram examinations, most  recent 06/10/2019. CAD, Pulmonary HTN and COPD,                 Signs/Symptoms:Dyspnea; Risk Factors:Former Smoker, Hypertension                 and Sleep Apnea.  Sonographer:    Jonelle Sidle Dance Referring Phys: [id number] [place]  1. Left ventricular ejection fraction, by estimation, is 60 to 65%. The left ventricle has normal function. The left ventricle has no regional wall motion abnormalities. Left ventricular diastolic parameters are consistent with Grade I  diastolic dysfunction (impaired relaxation). The average left ventricular global longitudinal strain is -17.0 %.  2. Right ventricular systolic function is normal. The right ventricular size is normal.  3. The mitral valve is normal in structure. Trivial mitral valve regurgitation. No evidence of mitral stenosis.  4. The aortic valve is normal in structure. Aortic valve regurgitation is not visualized. No aortic stenosis is present.  5. Pulmonic valve regurgitation is moderate.  6. The inferior vena cava is normal in size with greater than 50% respiratory variability, suggesting right atrial pressure of 3 mmHg. Comparison(s): No significant change from prior study. Prior images reviewed side by side. FINDINGS  Left Ventricle: Left ventricular ejection fraction, by estimation, is 60 to 65%. The left ventricle has normal function. The left ventricle has no regional wall motion abnormalities. The average left ventricular global longitudinal strain is -17.0 %. The left ventricular internal cavity size was normal in size. There is no left ventricular hypertrophy. Left ventricular diastolic parameters are consistent with Grade I diastolic dysfunction (impaired relaxation). Right Ventricle: The right ventricular size is normal. No increase in right ventricular wall thickness. Right ventricular systolic function is normal. Left Atrium: Left atrial size was normal in size. Right Atrium: Right atrial size was normal in size. Pericardium: There is no evidence of pericardial effusion. Mitral Valve: The mitral valve is normal in structure. Normal mobility of the mitral valve leaflets. Trivial mitral valve regurgitation. No evidence of mitral valve stenosis. Tricuspid Valve: The tricuspid valve is normal in structure. Tricuspid valve regurgitation is trivial. No evidence of tricuspid stenosis. Aortic Valve: The aortic valve is normal in structure. Aortic valve regurgitation is not visualized. No aortic stenosis is present.  Pulmonic Valve: The pulmonic valve was normal in structure. Pulmonic valve regurgitation is moderate. No evidence of pulmonic stenosis. Aorta: The aortic root is normal in size and structure. Venous: The inferior vena cava is normal in size with greater than 50% respiratory variability, suggesting right atrial pressure of 3 mmHg. IAS/Shunts: No atrial level shunt detected by color flow Doppler.  LEFT VENTRICLE PLAX 2D LVIDd:         3.80 cm  Diastology LVIDs:         2.50 cm  LV e' lateral:   5.88 cm/s LV PW:         0.90 cm  LV E/e' lateral: 15.4 LV IVS:        1.00 cm  LV e' medial:    6.53 cm/s LVOT diam:     2.00 cm  LV E/e' medial:  13.9 LV SV:         64 LV SV Index:   40       2D Longitudinal Strain LVOT Area:     3.14 cm 2D Strain GLS Avg:     -17.0 %  RIGHT VENTRICLE             IVC RV Basal diam:  2.50 cm     IVC diam: 1.60 cm RV S prime:     13.40 cm/s TAPSE (M-mode): 2.2 cm LEFT ATRIUM             Index       RIGHT ATRIUM          Index LA diam:        3.50 cm 2.21 cm/m  RA Area:     9.43 cm LA Vol (A2C):   29.2 ml 18.42 ml/m RA Volume:   21.00 ml 13.25 ml/m LA Vol (A4C):   30.3 ml 19.12 ml/m LA Biplane Vol: 30.8 ml 19.43 ml/m  AORTIC VALVE LVOT Vmax:   90.70 cm/s LVOT Vmean:  54.900 cm/s LVOT VTI:    0.204 m  AORTA Ao Asc diam: 3.25 cm MITRAL VALVE MV Area (PHT): 2.37 cm     SHUNTS MV Decel Time: 320 msec     Systemic VTI:  0.20 m MV E velocity: 90.70 cm/s   Systemic Diam: 2.00 cm MV A velocity: 113.00 cm/s MV E/A ratio:  0.80 Candee Furbish MD Electronically signed by Candee Furbish MD Signature Date/Time: 12/17/2019/10:45:00 AM    Final    VAS US CAROTID  Result Date: 12/19/2019 Carotid Arterial Duplex Study Indications:       Patient was seen at [facility] where they found she had                    a central retinal vein occlusion with some swelling in her                    right eye and a carotid duplex test was reccommended. Patient                    denies any cerebrovascular symptoms  at this time. Risk Factors:      Hypertension, hyperlipidemia, past history of smoking,                    coronary artery disease. Comparison Study:  A carotid duplex performed in 02/2007 at [facility]                    and Vascular showed no significant stenosis of the carotid                    arteries. Performing Technologist: Mariane Masters RVT  Examination Guidelines: A complete evaluation includes B-mode imaging, spectral Doppler, color Doppler, and power Doppler as needed of all accessible portions of each vessel. Bilateral testing is considered an integral part of a complete examination. Limited examinations for reoccurring indications may be performed as noted.  Right Carotid Findings: +----------+--------+--------+--------+------------------+------------------+           PSV cm/sEDV cm/sStenosisPlaque DescriptionComments           +----------+--------+--------+--------+------------------+------------------+ CCA Prox  66      8                                                    +----------+--------+--------+--------+------------------+------------------+ CCA Distal57      10                                intimal thickening +----------+--------+--------+--------+------------------+------------------+  ICA Prox  52      10              smooth                               +----------+--------+--------+--------+------------------+------------------+ ICA Mid   67      14      1-39%                     tortuous           +----------+--------+--------+--------+------------------+------------------+ ICA Distal48      17                                tortuous           +----------+--------+--------+--------+------------------+------------------+ ECA       76      0               hypoechoic                           +----------+--------+--------+--------+------------------+------------------+  +----------+--------+-------+----------------+-------------------+           PSV cm/sEDV cmsDescribe        Arm Pressure (mmHG) +----------+--------+-------+----------------+-------------------+ [id number]            Multiphasic, [id number]                 +----------+--------+-------+----------------+-------------------+ +---------+--------+--+--------+--+---------+ VertebralPSV cm/s62EDV cm/s10Antegrade +---------+--------+--+--------+--+---------+ Hypoechoic thyroid nodule with echogenic center visualized at the base of the right neck measuring 1.9 x 1.6 cm. Left Carotid Findings: +----------+--------+--------+--------+----------------------+--------------+           PSV cm/sEDV cm/sStenosisPlaque Description    Comments       +----------+--------+--------+--------+----------------------+--------------+ CCA Prox  61      12                                                   +----------+--------+--------+--------+----------------------+--------------+ CCA Distal54      11                                                   +----------+--------+--------+--------+----------------------+--------------+ ICA Prox  68      17      1-39%   focal and heterogenousminimal plaque +----------+--------+--------+--------+----------------------+--------------+ ICA Mid   60      14                                    tortuous       +----------+--------+--------+--------+----------------------+--------------+ ICA Distal46      14                                    tortuous       +----------+--------+--------+--------+----------------------+--------------+ ECA       67      6               heterogenous                         +----------+--------+--------+--------+----------------------+--------------+ +----------+--------+--------+----------------+-------------------+  PSV cm/sEDV cm/sDescribe        Arm Pressure (mmHG)  +----------+--------+--------+----------------+-------------------+ [id number]              Multiphasic, [id number]                 +----------+--------+--------+----------------+-------------------+ +---------+--------+--+--------+--+---------+ VertebralPSV cm/s94EDV cm/s14Antegrade +---------+--------+--+--------+--+---------+   Summary: Right Carotid: Velocities in the right ICA are consistent with a 1-39% stenosis. Left Carotid: Velocities in the left ICA are consistent with a 1-39% stenosis. Vertebrals:  Bilateral vertebral arteries demonstrate antegrade flow. Subclavians: Normal flow hemodynamics were seen in bilateral subclavian              arteries.  Hypoechoic thyroid nodule with echogenic center visualized at the base of the right neck measuring 1.9 x 1.6 cm. Suggest dedicated thyroid ultrasound if clinically indicated. *See table(s) above for measurements and observations.  Electronically signed by Larae Grooms MD on 12/19/2019 at 12:19:31 AM.    Final     ASSESSMENT & PLAN Jo Hendrix 86 y.o. female with medical history significant for metastatic adenocarcinoma of the lung who presents for a follow up visit.  After review the labs, review the imaging, discussion with the patient her findings are most consistent with good interval response to capmatinib.  As such I do believe it be worthwhile to continue this medication despite the lower extremity edema.    Other than the edema the patient is tolerating the medication quite well without any other clear complications.  Cardiology has adjusted her Lasix dosage to 40 mg daily.  The patient has been otherwise quite well with good appetite and no other concerning symptoms.  We will plan to follow her up every 4 weeks while she is on this medication with interval visits if required due to new symptoms or concerns.  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 Plan for repeat in August 2021 -Given that the fluid retention remains overall stable and is not causing significant discomfort/ respiratory issues, I would prefer continuing capmatinib at 300 mg BID  -We will monitor the disease response with CT CAP q3month   ? Of note, patient has a history of contrast allergy, and requires premedication with Benadryl and prednisone -Periodic TSH monitoring   #Fluid retention -Secondary to capmatinib -Overall stable; since restarting dexamethasone and Lasix 436mdaily -Given the AKI, will monitor closely  #Leukocytosis, resolved -Secondary to steroid -WBC 9.9k today, stable -We will monitor it closely   #AKI -Likely due to mild volume depletion -Cr 1.67 today, mildly elevated -will monitoring carefully while patient is on lasix  No orders of the defined types were placed in this encounter.   All questions were answered. The patient knows to call the clinic with any problems, questions or concerns.  A total of more than 40 minutes were spent on this encounter and over half of that time was spent on counseling and coordination of care as outlined above.   JoLedell PeoplesMD Department of Hematology/Oncology [facility]one: [phone]: [phone]: [email or web address]  01/08/2020 3:05 PM

## 2020-01-14 ENCOUNTER — Ambulatory Visit
Admission: RE | Admit: 2020-01-14 | Discharge: 2020-01-14 | Disposition: A | Discharge status: Home / Self Care | Payer: Medicare HMO | Source: Ambulatory Visit | Attending: Cardiovascular Disease | Admitting: Cardiovascular Disease

## 2020-01-14 DIAGNOSIS — R609 Edema, unspecified: Secondary | ICD-10-CM

## 2020-01-14 DIAGNOSIS — R0601 Orthopnea: Secondary | ICD-10-CM

## 2020-01-14 DIAGNOSIS — I779 Disorder of arteries and arterioles, unspecified: Secondary | ICD-10-CM

## 2020-01-18 ENCOUNTER — Telehealth: Payer: Self-pay | Admitting: *Deleted

## 2020-01-18 ENCOUNTER — Other Ambulatory Visit: Payer: Self-pay | Admitting: *Deleted

## 2020-01-18 DIAGNOSIS — R609 Edema, unspecified: Secondary | ICD-10-CM

## 2020-01-18 MED ORDER — POTASSIUM CHLORIDE CRYS ER 20 MEQ PO TBCR
20.0000 meq | EXTENDED_RELEASE_TABLET | Freq: Every day | ORAL | 1 refills | Status: DC
Start: 2020-01-18 — End: 2020-08-17

## 2020-01-18 MED ORDER — FUROSEMIDE 40 MG PO TABS: 40.0000 mg | ORAL_TABLET | Freq: Every day | ORAL | 3 refills | Status: DC

## 2020-01-18 NOTE — Telephone Encounter (Signed)
Received call from patient. She states she is having increased swelling in her arms and legs and experiencing tingling.  She is asking if she should increase the Lasix she takes.  She takes 40 mg daily. Spoke with Dr. Lorenso Courier and advised for patient to take lasix 40mg  q8am and 2pm daily, along with KCL 20 meq daily.   [name] back to Jo Hendrix and advised of the above.  She states she has KCL 20MEG at home already and has enough.  Advised to call next week to let us know how she is doing and if she notices any improvement in symptoms.  Pt voiced understanding to the above.

## 2020-01-21 ENCOUNTER — Telehealth: Payer: Self-pay | Admitting: Cardiovascular Disease

## 2020-01-21 NOTE — Telephone Encounter (Signed)
Attempted to call [place] back, on hold for over 20 minutes. Staff will need to try again tomorrow.

## 2020-01-21 NOTE — Telephone Encounter (Signed)
New Message   [place] from [employer] is calling with results    Please advise

## 2020-01-22 NOTE — Telephone Encounter (Signed)
Spoke with Jo Hendrix, they wanted to make sure the thyroid nodule was noted on the recent carotid doppler. Aware, yes Dr Claiborne Billings saw the nodule. Made aware that is usually followed by medical doctor but it has been noted.

## 2020-01-25 ENCOUNTER — Other Ambulatory Visit: Payer: Self-pay

## 2020-01-25 ENCOUNTER — Inpatient Hospital Stay: Payer: Medicare HMO | Attending: Hematology | Admitting: Medical

## 2020-01-25 ENCOUNTER — Other Ambulatory Visit: Payer: Self-pay | Admitting: Emergency Medicine

## 2020-01-25 ENCOUNTER — Telehealth: Payer: Self-pay

## 2020-01-25 ENCOUNTER — Inpatient Hospital Stay: Payer: Medicare HMO

## 2020-01-25 VITALS — BP 128/49 | HR 89 | Temp 98.1°F | Resp 18 | Ht 59.0 in | Wt 151.8 lb

## 2020-01-25 DIAGNOSIS — I2510 Atherosclerotic heart disease of native coronary artery without angina pectoris: Secondary | ICD-10-CM | POA: Diagnosis not present

## 2020-01-25 DIAGNOSIS — I272 Pulmonary hypertension, unspecified: Secondary | ICD-10-CM | POA: Insufficient documentation

## 2020-01-25 DIAGNOSIS — R609 Edema, unspecified: Secondary | ICD-10-CM

## 2020-01-25 DIAGNOSIS — G4730 Sleep apnea, unspecified: Secondary | ICD-10-CM | POA: Insufficient documentation

## 2020-01-25 DIAGNOSIS — Z801 Family history of malignant neoplasm of trachea, bronchus and lung: Secondary | ICD-10-CM | POA: Insufficient documentation

## 2020-01-25 DIAGNOSIS — E8809 Other disorders of plasma-protein metabolism, not elsewhere classified: Secondary | ICD-10-CM | POA: Diagnosis not present

## 2020-01-25 DIAGNOSIS — J449 Chronic obstructive pulmonary disease, unspecified: Secondary | ICD-10-CM | POA: Diagnosis not present

## 2020-01-25 DIAGNOSIS — Z87891 Personal history of nicotine dependence: Secondary | ICD-10-CM | POA: Insufficient documentation

## 2020-01-25 DIAGNOSIS — N179 Acute kidney failure, unspecified: Secondary | ICD-10-CM | POA: Insufficient documentation

## 2020-01-25 DIAGNOSIS — C3491 Malignant neoplasm of unspecified part of right bronchus or lung: Secondary | ICD-10-CM

## 2020-01-25 DIAGNOSIS — Z803 Family history of malignant neoplasm of breast: Secondary | ICD-10-CM | POA: Diagnosis not present

## 2020-01-25 DIAGNOSIS — I10 Essential (primary) hypertension: Secondary | ICD-10-CM | POA: Diagnosis not present

## 2020-01-25 DIAGNOSIS — Z7982 Long term (current) use of aspirin: Secondary | ICD-10-CM | POA: Insufficient documentation

## 2020-01-25 DIAGNOSIS — Z79899 Other long term (current) drug therapy: Secondary | ICD-10-CM | POA: Insufficient documentation

## 2020-01-25 DIAGNOSIS — R600 Localized edema: Secondary | ICD-10-CM | POA: Insufficient documentation

## 2020-01-25 DIAGNOSIS — C3481 Malignant neoplasm of overlapping sites of right bronchus and lung: Secondary | ICD-10-CM | POA: Diagnosis present

## 2020-01-25 LAB — CBC WITH DIFFERENTIAL (CANCER CENTER ONLY)
Abs Immature Granulocytes: 0.04 10*3/uL (ref 0.00–0.07)
Basophils Absolute: 0 10*3/uL (ref 0.0–0.1)
Basophils Relative: 0 %
Eosinophils Absolute: 0.1 10*3/uL (ref 0.0–0.5)
Eosinophils Relative: 2 %
HCT: 37.7 % (ref 36.0–46.0)
Hemoglobin: 12.3 g/dL (ref 12.0–15.0)
Immature Granulocytes: 1 %
Lymphocytes Relative: 25 %
Lymphs Abs: 2.1 10*3/uL (ref 0.7–4.0)
MCH: 30.8 pg (ref 26.0–34.0)
MCHC: 32.6 g/dL (ref 30.0–36.0)
MCV: 94.5 fL (ref 80.0–100.0)
Monocytes Absolute: 0.5 10*3/uL (ref 0.1–1.0)
Monocytes Relative: 6 %
Neutro Abs: 5.5 10*3/uL (ref 1.7–7.7)
Neutrophils Relative %: 66 %
Platelet Count: 247 10*3/uL (ref 150–400)
RBC: 3.99 MIL/uL (ref 3.87–5.11)
RDW: 13.3 % (ref 11.5–15.5)
WBC Count: 8.2 10*3/uL (ref 4.0–10.5)
nRBC: 0 % (ref 0.0–0.2)

## 2020-01-25 LAB — CMP (CANCER CENTER ONLY)
ALT: 15 U/L (ref 0–44)
AST: 19 U/L (ref 15–41)
Albumin: 2.4 g/dL — ABNORMAL LOW (ref 3.5–5.0)
Alkaline Phosphatase: 82 U/L (ref 38–126)
Anion gap: 8 (ref 5–15)
BUN: 30 mg/dL — ABNORMAL HIGH (ref 8–23)
CO2: 31 mmol/L (ref 22–32)
Calcium: 7.8 mg/dL — ABNORMAL LOW (ref 8.9–10.3)
Chloride: 103 mmol/L (ref 98–111)
Creatinine: 1.92 mg/dL — ABNORMAL HIGH (ref 0.44–1.00)
GFR, Est AFR Am: 26 mL/min — ABNORMAL LOW (ref >60)
GFR, Estimated: 22 mL/min — ABNORMAL LOW (ref >60)
Glucose, Bld: 108 mg/dL — ABNORMAL HIGH (ref 70–99)
Potassium: 3.7 mmol/L (ref 3.5–5.1)
Sodium: 142 mmol/L (ref 135–145)
Total Bilirubin: 0.4 mg/dL (ref 0.3–1.2)
Total Protein: 5.2 g/dL — ABNORMAL LOW (ref 6.5–8.1)

## 2020-01-25 NOTE — Telephone Encounter (Signed)
Received call from pt. stating the swelling w/tingling in her arms and feet have not improved at all. Pt. verbally confirmed that she is taking the Lasix 40 mg at 8am & 2pm along with KCL 32meq daily.  Informed Dr. Lorenso Courier and he advised pt. come into [facility] today or see him sooner than her scheduled visit for next week.  Pt. agreed to come into [facility] today. Appt. scheduled for Lab at 1:15 pm and [facility] at 1:45 pm, pt. knows to arrive 15 minutes before appt.

## 2020-01-25 NOTE — Progress Notes (Signed)
 Symptoms Management Clinic Progress Note   Jo Hendrix [id number] 08/08/1927 86 y.o.  [name] is managed by Dr. John Dorsey  Actively treated with chemotherapy/immunotherapy/hormonal therapy: yes  Current therapy: Palliative capmatinib (300mg BID)  Next scheduled appointment with provider: 06 / 25 / 2021  Assessment: Plan:    Adenocarcinoma of right lung (HCC)  Bilateral lower extremity edema  Hypoalbuminemia   Bilateral lower extremity pitting edema noted on physical exam. Electrolytes were within normal limits. Patient will increase lasix from 120mg daily to 160mg daily. Patient agreed to take two 40mg tablets at 8am, one 40mg tablet at 2pm, and a final tablet at 8pm before bedtime. She was advised to do this for 1 week.  Patient will continue to take 20meq KCL daily and potassium was within normal limits during today's visit.   CMP demonstrated albumin of 2.4. The patient was counseled on treatment and this condition. Patient was encouraged to continue to eat more protein in her diet as well as to continue to stay well hydrated with fluids during the day.    Please see After Visit Summary for patient specific instructions.  Future Appointments  Date Time Provider Department Center  02/05/2020 11:00 AM [facility] ONLY [facility] None  02/05/2020 11:30 AM Dorsey, John T IV, MD [facility] None  04/13/2020 11:00 AM Kelly, Thomas A, MD [facility]    No orders of the defined types were placed in this encounter.      Subjective:   Patient ID:  Jo Hendrix is a 86 y.o. (DOB 02/28/1927) female.  Chief Complaint:  Chief Complaint  Patient presents with  . Edema    Bilateral Hands & Legs/Feet    HPI Jo Hendrix is a 86 year old female patient with metastatic adenocarcinoma of the lung currently being treated with palliative capmatinib 300mg BID, who presented to the symptom management clinic today with increasing complaints of bilateral lower  extremity pitting edema. The patient states that this edema has continued to worsen for the past 6 weeks, and she is currently taking 40mg Lasix TID. Her current Lasix dose was increased to 120mg daily about 9 days ago and the patient states she has not noticed much of a difference. The patient states that her legs feel painful and also states that her hands and arms occasionally swell. The patient denies having much of an appetite since starting her treatment, but states that she tries to eat food during the day as well as some boosts for extra protein. She denies eating very salty or processed foods.The patient states that she drinks plenty of water, but does not urinate much throughout the day. The patient endorsed some shortness of breath on exertion, some occasional chills throughout the day, and increased weakness. However, she denies any shortness of breath at rest, fevers, chest pain, nausea, vomiting, diarrhea, or abdominal pain. The patient states that her weight has been increasing but she attributes this to the extra fluid she has been retaining.   Medications: I have reviewed the patient's current medications.  Allergies:  Allergies  Allergen Reactions  . Acetaminophen Anaphylaxis and Other (See Comments)    Difficulty urinating Difficulty urinating   . Benadryl [Diphenhydramine] Other (See Comments)    Tongue gets thick, feel jittery  . Zetia [Ezetimibe] Other (See Comments)    Chest pressure  . Gabapentin     dizziness  . Hydrochlorothiazide Nausea And Vomiting    Dizziness  . Ace Inhibitors Cough  .   Codeine Other (See Comments)    difficulty urinating  . Erythromycin Other (See Comments)    DOESN'T REMEMBER   . Etodolac Nausea Only  . Ivp Dye [Iodinated Diagnostic Agents] Hives    Ok to proceed 13 hour prep without benadryl per Radiologist (Dr. Liebkemann 12/21/19 hp)     Past Medical History:  Diagnosis Date  . ACE-inhibitor cough   . COPD (chronic obstructive  pulmonary disease) (HCC)   . Diastolic dysfunction   . History of tobacco use    smoked 25 years  . Hypertension   . Mild CAD   . Mild pulmonary hypertension (HCC)   . OSA (obstructive sleep apnea)    Sleep Study 04/07/2007 - AHI during total sleep 21.30/hr, during REM 20.16/hr - BiPAP auto servo-ventilation unit    Past Surgical History:  Procedure Laterality Date  . benign lesion removed on lung  2000  . BLADDER REPAIR    . CARDIAC CATHETERIZATION  05/20/2007   mild coronary obstructive disease with 20% narrowing in prox LAD, diffuse luminal irregularity of 40-50% in mid RCA (Dr. T. Kelly)  . Cardiopulmonary Met Test  01/28/2012   with PFTs - FEV1 & FEV1/VC WNL, VC WNL, DLCO WNL; abnormal pulmonary response  . Carotid Doppler  04/2011   normal patency  . CARPAL TUNNEL RELEASE     x2  . CATARACT EXTRACTION, BILATERAL    . ENDOBRONCHIAL ULTRASOUND N/A 06/12/2019   Procedure: ENDOBRONCHIAL ULTRASOUND;  Surgeon: Smith, Daniel C, MD;  Location: [facility];  Service: Endoscopy;  Laterality: N/A;  . FINE NEEDLE ASPIRATION  06/12/2019   Procedure: FINE NEEDLE ASPIRATION;  Surgeon: Smith, Daniel C, MD;  Location: [facility];  Service: Endoscopy;;  . GALLBLADDER SURGERY    . NM MYOCAR PERF WALL MOTION  09/2011   lexiscan myoview - normal perfusion, EF 77%, low risk  . Renal Doppler  05/2007   normal renal arteries   . TONSILLECTOMY    . TRANSTHORACIC ECHOCARDIOGRAM  2013   EF 50-55%; mild MR; mild TR; mild pulm valve regurg; aortic root sclerosis/calcification  . VAGINAL HYSTERECTOMY    . VARICOSE VEIN SURGERY    . VIDEO BRONCHOSCOPY  06/12/2019   Procedure: VIDEO BRONCHOSCOPY;  Surgeon: Smith, Daniel C, MD;  Location: [facility];  Service: Endoscopy;;    Family History  Problem Relation Age of Onset  . Lung cancer Mother   . Suicidality Father   . Cancer Sister   . Breast cancer Daughter   . Stroke Sister     Social History   Socioeconomic History  . Marital status:  Widowed    Spouse name: Not on file  . Number of children: 3  . Years of education: Not on file  . Highest education level: Not on file  Occupational History  . Occupation: retired  Tobacco Use  . Smoking status: Former Smoker    Packs/day: 0.50    Years: 25.00    Pack years: 12.50    Types: Cigarettes    Quit date: 08/13/1984    Years since quitting: 35.4  . Smokeless tobacco: Never Used  Vaping Use  . Vaping Use: Never used  Substance and Sexual Activity  . Alcohol use: No  . Drug use: No  . Sexual activity: Not on file  Other Topics Concern  . Not on file  Social History Narrative  . Not on file   Social Determinants of Health   Financial Resource Strain:   . Difficulty of Paying Living Expenses:     Food Insecurity:   . Worried About Running Out of Food in the Last Year:   . Ran Out of Food in the Last Year:   Transportation Needs:   . Lack of Transportation (Medical):   . Lack of Transportation (Non-Medical):   Physical Activity:   . Days of Exercise per Week:   . Minutes of Exercise per Session:   Stress:   . Feeling of Stress :   Social Connections:   . Frequency of Communication with Friends and Family:   . Frequency of Social Gatherings with Friends and Family:   . Attends Religious Services:   . Active Member of Clubs or Organizations:   . Attends Club or Organization Meetings:   . Marital Status:   Intimate Partner Violence:   . Fear of Current or Ex-Partner:   . Emotionally Abused:   . Physically Abused:   . Sexually Abused:     Past Medical History, Surgical history, Social history, and Family history were reviewed and updated as appropriate.   Please see review of systems for further details on the patient's review from today.   Review of Systems:  Review of Systems  Constitutional: Positive for chills, fatigue and unexpected weight change. Negative for fever.  HENT: Negative for facial swelling.   Respiratory: Negative for cough, chest  tightness and shortness of breath.        Exertional dyspnea  Cardiovascular: Positive for chest pain and leg swelling. Negative for palpitations.  Gastrointestinal: Negative for abdominal distention, abdominal pain, blood in stool, constipation, diarrhea, nausea and vomiting.  Genitourinary: Positive for decreased urine volume. Negative for frequency and urgency.  Skin: Negative for color change, pallor, rash and wound.  Neurological: Negative for dizziness and numbness.    Objective:   Physical Exam:  BP (!) 128/49 (BP Location: Left Arm, Patient Position: Sitting)   Pulse 89   Temp 98.1 F (36.7 C) (Temporal)   Resp 18   Ht 4' 11" (1.499 m)   Wt 151 lb 12.8 oz (68.9 kg)   SpO2 98%   BMI 30.66 kg/m  ECOG: 1  Physical Exam Constitutional:      General: She is not in acute distress.    Appearance: Normal appearance. She is not toxic-appearing.     Comments: Appeared younger than stated age  HENT:     Head: Normocephalic and atraumatic.  Cardiovascular:     Rate and Rhythm: Normal rate and regular rhythm.     Pulses: Normal pulses.     Heart sounds: Normal heart sounds. No murmur heard.  No friction rub. No gallop.   Pulmonary:     Effort: Pulmonary effort is normal.     Breath sounds: Normal breath sounds.  Abdominal:     General: Abdomen is flat. There is no distension.     Palpations: Abdomen is soft.     Tenderness: There is no abdominal tenderness.  Musculoskeletal:        General: Swelling and tenderness present.     Right lower leg: Edema present.     Left lower leg: Edema present.     Comments: 2+ bilateral lower extremity edema  Skin:    Findings: No bruising.  Neurological:     Mental Status: She is alert.  Psychiatric:        Mood and Affect: Mood normal.     Lab Review:     Component Value Date/Time   NA 142 01/25/2020 1302   NA 140 09/22/2018 1102     K 3.7 01/25/2020 1302   CL 103 01/25/2020 1302   CO2 31 01/25/2020 1302   GLUCOSE 108 (H)  01/25/2020 1302   BUN 30 (H) 01/25/2020 1302   BUN 15 09/22/2018 1102   CREATININE 1.92 (H) 01/25/2020 1302   CREATININE 1.35 (H) 01/16/2017 0817   CALCIUM 7.8 (L) 01/25/2020 1302   PROT 5.2 (L) 01/25/2020 1302   PROT 6.8 09/22/2018 1102   ALBUMIN 2.4 (L) 01/25/2020 1302   ALBUMIN 3.9 09/22/2018 1102   AST 19 01/25/2020 1302   ALT 15 01/25/2020 1302   ALKPHOS 82 01/25/2020 1302   BILITOT 0.4 01/25/2020 1302   GFRNONAA 22 (L) 01/25/2020 1302   GFRAA 26 (L) 01/25/2020 1302       Component Value Date/Time   WBC 8.2 01/25/2020 1302   WBC 9.4 09/16/2019 1131   RBC 3.99 01/25/2020 1302   HGB 12.3 01/25/2020 1302   HGB 13.9 09/22/2018 1102   HCT 37.7 01/25/2020 1302   HCT 41.5 09/22/2018 1102   PLT 247 01/25/2020 1302   PLT 323 09/22/2018 1102   MCV 94.5 01/25/2020 1302   MCV 87 09/22/2018 1102   MCH 30.8 01/25/2020 1302   MCHC 32.6 01/25/2020 1302   RDW 13.3 01/25/2020 1302   RDW 13.2 09/22/2018 1102   LYMPHSABS 2.1 01/25/2020 1302   LYMPHSABS 1.8 09/22/2018 1102   MONOABS 0.5 01/25/2020 1302   EOSABS 0.1 01/25/2020 1302   EOSABS 0.2 09/22/2018 1102   BASOSABS 0.0 01/25/2020 1302   BASOSABS 0.0 09/22/2018 1102   -------------------------------  Imaging from last 24 hours (if applicable):  Radiology interpretation: US THYROID  Result Date: 01/15/2020 CLINICAL DATA:  Nodule. EXAM: THYROID ULTRASOUND TECHNIQUE: Ultrasound examination of the thyroid gland and adjacent soft tissues was performed. COMPARISON:  06/09/2019 FINDINGS: Parenchymal Echotexture: Mildly heterogenous Isthmus: 0.3 cm thickness, stable Right lobe: 3.1 x 1.8 x 1.8 cm, previously 3.3 x 1.9 x 1.9 Left lobe: 3.4 x 1.3 x 1.1 cm, previously 3.6 x 1.3 x 1.2 _________________________________________________________ Estimated total number of nodules >/= 1 cm: 1 Number of spongiform nodules >/=  2 cm not described below (TR1): 0 Number of mixed cystic and solid nodules >/= 1.5 cm not described below (TR2): 0  _________________________________________________________ Nodule # 1: Prior biopsy: No Location: Right; Mid Maximum size: 2.2 cm; Other 2 dimensions: 1.5 x 1.3 cm, previously, 1.9 x 1.5 x 1.4 cm Composition: mixed cystic and solid (1) Echogenicity: hypoechoic (2) Shape: not taller-than-wide (0) Margins: smooth (0) Echogenic foci: none (0) ACR TI-RADS total points: 3. ACR TI-RADS risk category:  TR3 (3 points). Significant change in size (>/= 20% in two dimensions and minimal increase of 2 mm): No Change in features: No Change in ACR TI-RADS risk category: No ACR TI-RADS recommendations: *Given size (>/= 1.5 - 2.4 cm) and appearance, a follow-up ultrasound in 1 year should be considered based on TI-RADS criteria. _________________________________________________________ IMPRESSION: 1. Stable solitary right nodule, which does not meet criteria for biopsy. Recommend annual/biennial ultrasound follow-up of nodule as above, until stability x5 years confirmed. The above is in keeping with the ACR TI-RADS recommendations - J Am Coll Radiol 2017;14:587-595. Electronically Signed   By: D  Hassell M.D.   On: 01/15/2020 11:02       

## 2020-01-25 NOTE — Patient Instructions (Signed)

## 2020-02-05 ENCOUNTER — Inpatient Hospital Stay: Payer: Medicare HMO

## 2020-02-05 ENCOUNTER — Other Ambulatory Visit: Payer: Self-pay | Admitting: Hematology and Oncology

## 2020-02-05 ENCOUNTER — Inpatient Hospital Stay: Payer: Medicare HMO | Admitting: Hematology and Oncology

## 2020-02-05 ENCOUNTER — Other Ambulatory Visit: Payer: Self-pay

## 2020-02-05 VITALS — BP 125/62 | HR 80 | Temp 98.0°F | Resp 17 | Ht 59.0 in | Wt 148.3 lb

## 2020-02-05 DIAGNOSIS — N179 Acute kidney failure, unspecified: Secondary | ICD-10-CM | POA: Diagnosis not present

## 2020-02-05 DIAGNOSIS — E041 Nontoxic single thyroid nodule: Secondary | ICD-10-CM

## 2020-02-05 DIAGNOSIS — R609 Edema, unspecified: Secondary | ICD-10-CM

## 2020-02-05 DIAGNOSIS — C3481 Malignant neoplasm of overlapping sites of right bronchus and lung: Secondary | ICD-10-CM | POA: Diagnosis not present

## 2020-02-05 DIAGNOSIS — C3491 Malignant neoplasm of unspecified part of right bronchus or lung: Secondary | ICD-10-CM | POA: Diagnosis not present

## 2020-02-05 LAB — CMP (CANCER CENTER ONLY)
ALT: 16 U/L (ref 0–44)
AST: 21 U/L (ref 15–41)
Albumin: 2.6 g/dL — ABNORMAL LOW (ref 3.5–5.0)
Alkaline Phosphatase: 81 U/L (ref 38–126)
Anion gap: 9 (ref 5–15)
BUN: 33 mg/dL — ABNORMAL HIGH (ref 8–23)
CO2: 28 mmol/L (ref 22–32)
Calcium: 8 mg/dL — ABNORMAL LOW (ref 8.9–10.3)
Chloride: 103 mmol/L (ref 98–111)
Creatinine: 1.99 mg/dL — ABNORMAL HIGH (ref 0.44–1.00)
GFR, Est AFR Am: 25 mL/min — ABNORMAL LOW (ref >60)
GFR, Estimated: 21 mL/min — ABNORMAL LOW (ref >60)
Glucose, Bld: 96 mg/dL (ref 70–99)
Potassium: 3.7 mmol/L (ref 3.5–5.1)
Sodium: 140 mmol/L (ref 135–145)
Total Bilirubin: 0.3 mg/dL (ref 0.3–1.2)
Total Protein: 5.9 g/dL — ABNORMAL LOW (ref 6.5–8.1)

## 2020-02-05 LAB — CBC WITH DIFFERENTIAL (CANCER CENTER ONLY)
Abs Immature Granulocytes: 0.04 10*3/uL (ref 0.00–0.07)
Basophils Absolute: 0 10*3/uL (ref 0.0–0.1)
Basophils Relative: 0 %
Eosinophils Absolute: 0.2 10*3/uL (ref 0.0–0.5)
Eosinophils Relative: 2 %
HCT: 39.4 % (ref 36.0–46.0)
Hemoglobin: 12.9 g/dL (ref 12.0–15.0)
Immature Granulocytes: 0 %
Lymphocytes Relative: 25 %
Lymphs Abs: 2.2 10*3/uL (ref 0.7–4.0)
MCH: 30.5 pg (ref 26.0–34.0)
MCHC: 32.7 g/dL (ref 30.0–36.0)
MCV: 93.1 fL (ref 80.0–100.0)
Monocytes Absolute: 0.7 10*3/uL (ref 0.1–1.0)
Monocytes Relative: 7 %
Neutro Abs: 6 10*3/uL (ref 1.7–7.7)
Neutrophils Relative %: 66 %
Platelet Count: 231 10*3/uL (ref 150–400)
RBC: 4.23 MIL/uL (ref 3.87–5.11)
RDW: 13.2 % (ref 11.5–15.5)
WBC Count: 9.2 10*3/uL (ref 4.0–10.5)
nRBC: 0 % (ref 0.0–0.2)

## 2020-02-05 LAB — LACTATE DEHYDROGENASE: LDH: 207 U/L — ABNORMAL HIGH (ref 98–192)

## 2020-02-05 LAB — MAGNESIUM: Magnesium: 1.6 mg/dL — ABNORMAL LOW (ref 1.7–2.4)

## 2020-02-05 NOTE — Progress Notes (Signed)
[place] [phone]   [phone]  PROGRESS NOTE  Patient Care Team: Buzzy Han, MD as PCP - General (Family Medicine) Troy Sine, MD as PCP - Cardiology (Cardiology)  Hematological/Oncological History HEME/ONC OVERVIEW: 1. Stage IIIB ([id number]) adenocarcinoma of the R lung, unresectable, MET exon 14 splice variant deletion+ -Late 05/2019:  ? A large R lung mass extending from the RUL to RLL with mediastinal invasion, R hilar denopathy on CT; no mets in abdomen ? Questionable small foci in L frontal and R parietal lobes on MRI brain, limited due to motion degradation; repeat MRI still limited by  ? Bronch w/ R lung bx, c/w adenocarcinoma; MET exon 14 splice variant deletion+ ? Large FDG-avid R lung mass with extension to the R hilum and paratracheal LN involvement; no distant mets  -07/2019 - present: palliative capmatinib  ? 12/2019: improving RUL and RLL malignancy and nodal metastasis; no new or metastatic disease   TREATMENT SUMMARY:  07/22/2019 - present: capmatinib, currently on 337m BID   Interval History:  Jo Hendrix 86 y.o. female with medical history significant for metastatic adenocarcinoma of the lung who presents for a follow up visit. The patient's last visit was on 01/08/2020 when she established care with Dr. DLorenso Courier In the interim since the last visit the patient has continued to have issues with whole body edema.  On exam today Jo Hendrix notes that she has lost a few pounds in the interim and that her appetite has been poor.  She notes that she never really feels very hungry but does try to make herself eat.  She reports that she does have trouble standing in 1 spot and she was concerned that [place] may have [place].  Other than the increased swelling in her extremities she is not had any issues with shortness of breath, cough, or vision changes.  She denies having any issues with  fevers, chills, sweats, nausea, vomiting or diarrhea.  Full 10 point ROS is listed below.  MEDICAL HISTORY:  Past Medical History:  Diagnosis Date  . ACE-inhibitor cough   . COPD (chronic obstructive pulmonary disease) [place]   . Diastolic dysfunction   . History of tobacco use    smoked 25 years  . Hypertension   . Mild CAD   . Mild pulmonary hypertension ([place]   . OSA (obstructive sleep apnea)    Sleep Study 04/07/2007 - AHI during total sleep 21.30/hr, during REM 20.16/hr - BiPAP auto servo-ventilation unit    SURGICAL HISTORY: Past Surgical History:  Procedure Laterality Date  . benign lesion removed on lung  2000  . BLADDER REPAIR    . CARDIAC CATHETERIZATION  05/20/2007   mild coronary obstructive disease with 20% narrowing in prox LAD, diffuse luminal irregularity of 40-50% in mid RCA (Dr. TCorky Downs  . Cardiopulmonary Met Test  01/28/2012   with PFTs - FEV1 & FEV1/VC WNL, VC WNL, DLCO WNL; abnormal pulmonary response  . Carotid Doppler  04/2011   normal patency  . CARPAL TUNNEL RELEASE     x2  . CATARACT EXTRACTION, BILATERAL    . ENDOBRONCHIAL ULTRASOUND N/A 06/12/2019   Procedure: ENDOBRONCHIAL ULTRASOUND;  Surgeon: SCandee Furbish MD;  Location: [facility];  Service: Endoscopy;  Laterality: N/A;  . FINE NEEDLE ASPIRATION  06/12/2019   Procedure: FINE NEEDLE ASPIRATION;  Surgeon: SCandee Furbish MD;  Location: [facility];  Service: Endoscopy;;  . GALLBLADDER SURGERY    .  NM MYOCAR PERF WALL MOTION  09/2011   lexiscan myoview - normal perfusion, EF 77%, low risk  . Renal Doppler  05/2007   normal renal arteries   . TONSILLECTOMY    . TRANSTHORACIC ECHOCARDIOGRAM  2013   EF 50-55%; mild MR; mild TR; mild pulm valve regurg; aortic root sclerosis/calcification  . VAGINAL HYSTERECTOMY    . VARICOSE VEIN SURGERY    . VIDEO BRONCHOSCOPY  06/12/2019   Procedure: VIDEO BRONCHOSCOPY;  Surgeon: Candee Furbish, MD;  Location: [facility];  Service: Endoscopy;;     SOCIAL HISTORY: Social History   Socioeconomic History  . Marital status: Widowed    Spouse name: Not on file  . Number of children: 3  . Years of education: Not on file  . Highest education level: Not on file  Occupational History  . Occupation: retired  Tobacco Use  . Smoking status: Former Smoker    Packs/day: 0.50    Years: 25.00    Pack years: 12.50    Types: Cigarettes    Quit date: 08/13/1984    Years since quitting: 35.5  . Smokeless tobacco: Never Used  Vaping Use  . Vaping Use: Never used  Substance and Sexual Activity  . Alcohol use: No  . Drug use: No  . Sexual activity: Not on file  Other Topics Concern  . Not on file  Social History Narrative  . Not on file   Social Determinants of Health   Financial Resource Strain:   . Difficulty of Paying Living Expenses:   Food Insecurity:   . Worried About Charity fundraiser in the Last Year:   . Arboriculturist in the Last Year:   Transportation Needs:   . Film/video editor (Medical):   Jo Hendrix Lack of Transportation (Non-Medical):   Physical Activity:   . Days of Exercise per Week:   . Minutes of Exercise per Session:   Stress:   . Feeling of Stress :   Social Connections:   . Frequency of Communication with Friends and Family:   . Frequency of Social Gatherings with Friends and Family:   . Attends Religious Services:   . Active Member of Clubs or Organizations:   . Attends Archivist Meetings:   Jo Hendrix Marital Status:   Intimate Partner Violence:   . Fear of Current or Ex-Partner:   . Emotionally Abused:   Jo Hendrix Physically Abused:   . Sexually Abused:     FAMILY HISTORY: Family History  Problem Relation Age of Onset  . Lung cancer Mother   . Suicidality Father   . Cancer Sister   . Breast cancer Daughter   . Stroke Sister     ALLERGIES:  is allergic to acetaminophen, benadryl [diphenhydramine], zetia [ezetimibe], gabapentin, hydrochlorothiazide, ace inhibitors, codeine, erythromycin,  etodolac, and ivp dye [iodinated diagnostic agents].  MEDICATIONS:  Current Outpatient Medications  Medication Sig Dispense Refill  . acetaminophen (TYLENOL) 325 MG tablet Take 650 mg by mouth every 6 (six) hours as needed for mild pain or headache.    Jo Hendrix amLODipine (NORVASC) 10 MG tablet  (Patient not taking: Reported on 02/05/2020)    . aspirin 81 MG tablet Take 81 mg by mouth daily.    . Calcium 1500 MG tablet Take 1,500 mg by mouth daily.    . capmatinib (TABRECTA) 150 MG tablet Take 250 mg by mouth 2 (two) times daily.    . cholecalciferol (VITAMIN D) 1000 UNITS tablet Take 1,000 Units by mouth  daily. Take 1 tab daily    . furosemide (LASIX) 40 MG tablet Take 1 tablet (40 mg total) by mouth daily. Take 1 tab @ 8am and 1 tab @ 2pm 90 tablet 3  . NON FORMULARY at bedtime. BiPAP    . Omega-3 Fatty Acids (FISH OIL) 1000 MG CAPS Take 1,000 mg by mouth daily.     . ondansetron (ZOFRAN) 8 MG tablet Take 1 tablet (8 mg total) by mouth every 8 (eight) hours as needed for nausea or vomiting. 30 tablet 2  . polyvinyl alcohol (LIQUIFILM TEARS) 1.4 % ophthalmic solution Place 1 drop into both eyes as needed for dry eyes.    . potassium chloride SA (KLOR-CON) 20 MEQ tablet Take 1 tablet (20 mEq total) by mouth daily. 30 tablet 1  . prochlorperazine (COMPAZINE) 10 MG tablet Take 1 tablet (10 mg total) by mouth every 6 (six) hours as needed for nausea or vomiting. 30 tablet 3  . RESTASIS 0.05 % ophthalmic emulsion Place 1 drop into both eyes 2 (two) times daily. Use 1 drop in each eye twice a day    . rosuvastatin (CRESTOR) 5 MG tablet Take 1 tablet (5 mg total) by mouth every other day. 45 tablet 2   No current facility-administered medications for this visit.    REVIEW OF SYSTEMS:   Constitutional: ( - ) fevers, ( - )  chills , ( - ) night sweats Eyes: ( - ) blurriness of vision, ( - ) double vision, ( - ) watery eyes Ears, nose, mouth, throat, and face: ( - ) mucositis, ( - ) sore  throat Respiratory: ( - ) cough, ( - ) dyspnea, ( - ) wheezes Cardiovascular: ( - ) palpitation, ( - ) chest discomfort, ( - ) lower extremity swelling Gastrointestinal:  ( - ) nausea, ( - ) heartburn, ( - ) change in bowel habits Skin: ( - ) abnormal skin rashes Lymphatics: ( - ) new lymphadenopathy, ( - ) easy bruising Neurological: ( - ) numbness, ( - ) tingling, ( - ) new weaknesses Behavioral/Psych: ( - ) mood change, ( - ) new changes  All other systems were reviewed with the patient and are negative.  PHYSICAL EXAMINATION: ECOG PERFORMANCE STATUS: 1 - Symptomatic but completely ambulatory  Vitals:   02/05/20 1142  BP: 125/62  Pulse: 80  Resp: 17  Temp: 98 F (36.7 C)  SpO2: 100%   Filed Weights   02/05/20 1142  Weight: 148 lb 4.8 oz (67.3 kg)    GENERAL: well appearing elderly Caucasian female (appears considerably younger than state age) alert, no distress and comfortable SKIN: skin color, texture, turgor are normal, no rashes or significant lesions EYES: conjunctiva are pink and non-injected, sclera clearl LUNGS: clear to auscultation and percussion with normal breathing effort HEART: regular rate & rhythm and no murmurs and + 2 pitting edema in LE bilaterally. +1 in UE.  Musculoskeletal: no cyanosis of digits and no clubbing  PSYCH: alert & oriented x 3, fluent speech NEURO: no focal motor/sensory deficits  LABORATORY DATA:  I have reviewed the data as listed CBC Latest Ref Rng & Units 02/05/2020 01/25/2020 01/08/2020  WBC 4.0 - 10.5 K/uL 9.2 8.2 9.9  Hemoglobin 12.0 - 15.0 g/dL 12.9 12.3 12.7  Hematocrit 36 - 46 % 39.4 37.7 38.5  Platelets 150 - 400 K/uL 231 247 254    CMP Latest Ref Rng & Units 02/05/2020 01/25/2020 01/08/2020  Glucose 70 - 99 mg/dL 96 108(H)  109(H)  BUN 8 - 23 mg/dL 33(H) 30(H) 22  Creatinine 0.44 - 1.00 mg/dL 1.99(H) 1.92(H) 1.67(H)  Sodium 135 - 145 mmol/L 140 142 141  Potassium 3.5 - 5.1 mmol/L 3.7 3.7 3.7  Chloride 98 - 111 mmol/L 103 103  102  CO2 22 - 32 mmol/L _0 Calcium 8.9 - 10.3 mg/dL 8.0(L) 7.8(L) 8.4(L)  Total Protein 6.5 - 8.1 g/dL 5.9(L) 5.2(L) 5.6(L)  Total Bilirubin 0.3 - 1.2 mg/dL 0.3 0.4 0.3  Alkaline Phos 38 - 126 U/L 81 82 89  AST 15 - 41 U/L _1 ALT 0 - 44 U/L _2 RADIOGRAPHIC STUDIES: US THYROID  Result Date: 01/15/2020 CLINICAL DATA:  Nodule. EXAM: THYROID ULTRASOUND TECHNIQUE: Ultrasound examination of the thyroid gland and adjacent soft tissues was performed. COMPARISON:  06/09/2019 FINDINGS: Parenchymal Echotexture: Mildly heterogenous Isthmus: 0.3 cm thickness, stable Right lobe: 3.1 x 1.8 x 1.8 cm, previously 3.3 x 1.9 x 1.9 Left lobe: 3.4 x 1.3 x 1.1 cm, previously 3.6 x 1.3 x 1.2 _________________________________________________________ Estimated total number of nodules >/= 1 cm: 1 Number of spongiform nodules >/=  2 cm not described below (TR1): 0 Number of mixed cystic and solid nodules >/= 1.5 cm not described below ([place]): 0 _________________________________________________________ Nodule # 1: Prior biopsy: No Location: Right; Mid Maximum size: 2.2 cm; Other 2 dimensions: 1.5 x 1.3 cm, previously, 1.9 x 1.5 x 1.4 cm Composition: mixed cystic and solid (1) Echogenicity: hypoechoic (2) Shape: not taller-than-wide (0) Margins: smooth (0) Echogenic foci: none (0) ACR TI-RADS total points: 3. ACR TI-RADS risk category:  TR3 (3 points). Significant change in size (>/= 20% in two dimensions and minimal increase of 2 mm): No Change in features: No Change in ACR TI-RADS risk category: No ACR TI-RADS recommendations: *Given size (>/= 1.5 - 2.4 cm) and appearance, a follow-up ultrasound in 1 year should be considered based on TI-RADS criteria. _________________________________________________________ IMPRESSION: 1. Stable solitary right nodule, which does not meet criteria for biopsy. Recommend annual/biennial ultrasound follow-up of nodule as above, until stability x5 years confirmed. The above is in  keeping with the ACR TI-RADS recommendations - J Am Coll Radiol 2017;14:587-595. Electronically Signed   By: Lucrezia Europe M.D.   On: 01/15/2020 11:02    ASSESSMENT & PLAN Jo Hendrix 86 y.o. female with medical history significant for metastatic adenocarcinoma of the lung who presents for a follow up visit.  After review the labs, review the imaging, discussion with the patient her findings are most consistent with good interval response to capmatinib.  As such I do believe it be worthwhile to continue this medication despite the lower extremity edema.    Other than the edema the patient is tolerating the medication quite well without any other clear complications.   The patient has been otherwise quite well with good appetite and no other concerning symptoms.  We will plan to follow her up every 4 weeks while she is on this medication with interval visits if required due to new symptoms or concerns.  #Stage IIIB ([id number]) adenocarcinoma of the R lung, unresectable, MET exon 14 splice variant deletion+ -last CT scan on 12/21/2019 showed stable disease. Plan for repeat in August 2021 -Given that the fluid retention remains overall stable and is not causing significant discomfort/ respiratory issues, I would prefer continuing capmatinib at 300 mg BID  -We will monitor the disease response with CT CAP q18month   ? Of note,  patient has a history of contrast allergy, and requires premedication with Benadryl and prednisone -Periodic TSH monitoring   #Fluid retention -Secondary to capmatinib -Overall stable, but remains uncomfortable.  -Given the AKI, will monitor closely  #Leukocytosis, resolved -Secondary to steroid -WBC 9.9k today, stable -We will monitor it closely   #AKI -Likely due to mild volume depletion and use of diuretic therapy as above -Cr 1.99 today, mildly elevated -will monitoring carefully while patient is on lasix  No orders of the defined types were placed in this  encounter.   All questions were answered. The patient knows to call the clinic with any problems, questions or concerns.  A total of more than 30 minutes were spent on this encounter and over half of that time was spent on counseling and coordination of care as outlined above.   Ledell Peoples, MD Department of Hematology/Oncology [place] at [facility] Phone: [phone] Pager: [phone] Email: [email or web address]  02/05/2020 12:01 PM

## 2020-02-09 ENCOUNTER — Encounter: Payer: Self-pay | Admitting: Hematology and Oncology

## 2020-02-11 ENCOUNTER — Telehealth: Payer: Self-pay | Admitting: *Deleted

## 2020-02-11 NOTE — Telephone Encounter (Signed)
Received call from pt regarding any possible changes to her diuretics as she is having having ongoing edema even with Lasix 180 mg daily.  She remembered Dr. Lorenso Courier telling her he would consult with her cardiologist. She has not heard from Dr. Shelva Majestic as of yet.  Please advise

## 2020-02-19 ENCOUNTER — Inpatient Hospital Stay: Payer: Medicare HMO | Attending: Hematology

## 2020-02-19 ENCOUNTER — Telehealth: Payer: Self-pay | Admitting: *Deleted

## 2020-02-19 ENCOUNTER — Telehealth: Payer: Self-pay | Admitting: Cardiovascular Disease

## 2020-02-19 ENCOUNTER — Other Ambulatory Visit: Payer: Self-pay

## 2020-02-19 ENCOUNTER — Other Ambulatory Visit: Payer: Self-pay | Admitting: *Deleted

## 2020-02-19 DIAGNOSIS — N179 Acute kidney failure, unspecified: Secondary | ICD-10-CM | POA: Diagnosis not present

## 2020-02-19 DIAGNOSIS — I272 Pulmonary hypertension, unspecified: Secondary | ICD-10-CM | POA: Diagnosis not present

## 2020-02-19 DIAGNOSIS — I10 Essential (primary) hypertension: Secondary | ICD-10-CM | POA: Insufficient documentation

## 2020-02-19 DIAGNOSIS — C3481 Malignant neoplasm of overlapping sites of right bronchus and lung: Secondary | ICD-10-CM | POA: Insufficient documentation

## 2020-02-19 DIAGNOSIS — Z7952 Long term (current) use of systemic steroids: Secondary | ICD-10-CM | POA: Diagnosis not present

## 2020-02-19 DIAGNOSIS — C779 Secondary and unspecified malignant neoplasm of lymph node, unspecified: Secondary | ICD-10-CM | POA: Insufficient documentation

## 2020-02-19 DIAGNOSIS — Z79899 Other long term (current) drug therapy: Secondary | ICD-10-CM | POA: Insufficient documentation

## 2020-02-19 DIAGNOSIS — C3491 Malignant neoplasm of unspecified part of right bronchus or lung: Secondary | ICD-10-CM

## 2020-02-19 DIAGNOSIS — Z87891 Personal history of nicotine dependence: Secondary | ICD-10-CM | POA: Diagnosis not present

## 2020-02-19 DIAGNOSIS — J449 Chronic obstructive pulmonary disease, unspecified: Secondary | ICD-10-CM | POA: Diagnosis not present

## 2020-02-19 DIAGNOSIS — D72829 Elevated white blood cell count, unspecified: Secondary | ICD-10-CM | POA: Insufficient documentation

## 2020-02-19 DIAGNOSIS — G4733 Obstructive sleep apnea (adult) (pediatric): Secondary | ICD-10-CM | POA: Insufficient documentation

## 2020-02-19 DIAGNOSIS — I2510 Atherosclerotic heart disease of native coronary artery without angina pectoris: Secondary | ICD-10-CM | POA: Diagnosis not present

## 2020-02-19 LAB — CBC WITH DIFFERENTIAL (CANCER CENTER ONLY)
Abs Immature Granulocytes: 0.04 10*3/uL (ref 0.00–0.07)
Basophils Absolute: 0 10*3/uL (ref 0.0–0.1)
Basophils Relative: 0 %
Eosinophils Absolute: 0.3 10*3/uL (ref 0.0–0.5)
Eosinophils Relative: 3 %
HCT: 36.4 % (ref 36.0–46.0)
Hemoglobin: 11.9 g/dL — ABNORMAL LOW (ref 12.0–15.0)
Immature Granulocytes: 1 %
Lymphocytes Relative: 27 %
Lymphs Abs: 2.3 10*3/uL (ref 0.7–4.0)
MCH: 30.3 pg (ref 26.0–34.0)
MCHC: 32.7 g/dL (ref 30.0–36.0)
MCV: 92.6 fL (ref 80.0–100.0)
Monocytes Absolute: 0.6 10*3/uL (ref 0.1–1.0)
Monocytes Relative: 7 %
Neutro Abs: 5.4 10*3/uL (ref 1.7–7.7)
Neutrophils Relative %: 62 %
Platelet Count: 241 10*3/uL (ref 150–400)
RBC: 3.93 MIL/uL (ref 3.87–5.11)
RDW: 13 % (ref 11.5–15.5)
WBC Count: 8.6 10*3/uL (ref 4.0–10.5)
nRBC: 0 % (ref 0.0–0.2)

## 2020-02-19 LAB — CMP (CANCER CENTER ONLY)
ALT: 14 U/L (ref 0–44)
AST: 20 U/L (ref 15–41)
Albumin: 2.5 g/dL — ABNORMAL LOW (ref 3.5–5.0)
Alkaline Phosphatase: 87 U/L (ref 38–126)
Anion gap: 8 (ref 5–15)
BUN: 34 mg/dL — ABNORMAL HIGH (ref 8–23)
CO2: 29 mmol/L (ref 22–32)
Calcium: 8.2 mg/dL — ABNORMAL LOW (ref 8.9–10.3)
Chloride: 102 mmol/L (ref 98–111)
Creatinine: 2 mg/dL — ABNORMAL HIGH (ref 0.44–1.00)
GFR, Est AFR Am: 25 mL/min — ABNORMAL LOW (ref >60)
GFR, Estimated: 21 mL/min — ABNORMAL LOW (ref >60)
Glucose, Bld: 136 mg/dL — ABNORMAL HIGH (ref 70–99)
Potassium: 3.9 mmol/L (ref 3.5–5.1)
Sodium: 139 mmol/L (ref 135–145)
Total Bilirubin: 0.3 mg/dL (ref 0.3–1.2)
Total Protein: 5.5 g/dL — ABNORMAL LOW (ref 6.5–8.1)

## 2020-02-19 NOTE — Telephone Encounter (Signed)
Patient's oncologist, Dr. Narda Rutherford is calling to speak with Dr. Claiborne Billings regarding the edema that the patient is experiencing with her chemo treatments. See staff message that was sent to Dr. Claiborne Billings and his RN.

## 2020-02-19 NOTE — Telephone Encounter (Signed)
PER  SCHEDULER, Dr Lorenso Courier is aware  Dr Claiborne Billings and nurse are out of the office  Awaits call  For next week - Monday

## 2020-02-19 NOTE — Telephone Encounter (Signed)
Received call from patient requesting some direction on the use of her Lasix for ongoing peripheral edema. In addition, she states her blood pressure was low this am-88/68. She states she is coming in for labs today. Advised that I would have the lab let me know when she is here .  I will check VS and assess overall condition and have Dr. Lorenso Courier review labs. Pt is agreeable to this.  Addendum: Saw pt this morning after labs were obtained. BP is good  @ 127/57, heart rate 64.  Denies lightheadedness, no change in edema. C/o ongoing fatigue from [place].  Urine out put seems to be adequate,  Reviewed CMP with Dr. Lorenso Courier. He advises that pt stop her 8pm lasix for now. He expects to hear from Dr. Claiborne Billings next Monday for further advice on diuretic therapy for pt. Pt voiced understanding with instructions. Advised that I would call her next week after Dr. Lorenso Courier discuses her situation with Dr. Claiborne Billings.

## 2020-02-29 ENCOUNTER — Telehealth: Payer: Self-pay | Admitting: Podiatry

## 2020-02-29 ENCOUNTER — Encounter: Payer: Self-pay | Admitting: Podiatry

## 2020-02-29 ENCOUNTER — Other Ambulatory Visit: Payer: Self-pay

## 2020-02-29 ENCOUNTER — Ambulatory Visit: Payer: Medicare HMO | Admitting: Podiatry

## 2020-02-29 ENCOUNTER — Ambulatory Visit (INDEPENDENT_AMBULATORY_CARE_PROVIDER_SITE_OTHER): Payer: Medicare HMO

## 2020-02-29 DIAGNOSIS — M778 Other enthesopathies, not elsewhere classified: Secondary | ICD-10-CM

## 2020-02-29 DIAGNOSIS — G629 Polyneuropathy, unspecified: Secondary | ICD-10-CM

## 2020-02-29 DIAGNOSIS — M779 Enthesopathy, unspecified: Secondary | ICD-10-CM | POA: Diagnosis not present

## 2020-02-29 NOTE — Telephone Encounter (Signed)
Pt called and stated she was seen today and forgot to ask dr regal if he could give her a gabapentin script she had one 92yrs ago but its out of date

## 2020-03-01 ENCOUNTER — Telehealth: Payer: Self-pay | Admitting: Cardiovascular Disease

## 2020-03-01 NOTE — Telephone Encounter (Signed)
Dr. Lorenso Courier from [place] calling to speak with Dr. Claiborne Billings. Routed his number to [place].

## 2020-03-01 NOTE — Telephone Encounter (Signed)
Discussedd with Dr.Dorsey;  will change furosemide totorsemide 40 mg; and f/u Bmet/renal fxn

## 2020-03-01 NOTE — Telephone Encounter (Signed)
Dr.Kelly aware and is speaking with Dr.Dorsey on the phone now. 03/01/20 @ 1716

## 2020-03-02 ENCOUNTER — Other Ambulatory Visit: Payer: Self-pay | Admitting: Podiatry

## 2020-03-02 MED ORDER — TORSEMIDE 20 MG PO TABS: 40.0000 mg | ORAL_TABLET | Freq: Every day | ORAL | 1 refills | Status: DC

## 2020-03-02 MED ORDER — GABAPENTIN 300 MG PO CAPS
300.0000 mg | ORAL_CAPSULE | Freq: Every day | ORAL | 3 refills | Status: DC
Start: 2020-03-02 — End: 2020-03-04

## 2020-03-02 NOTE — Telephone Encounter (Signed)
Valery:  Dr. Paulla Dolly did approve patient to get:  Gabapentin 300 mg Refills = 3  I sent to patient's pharmacy today.  Thank you,  Juliann Pulse, CMA ([place])

## 2020-03-02 NOTE — Progress Notes (Signed)
Subjective:   Patient ID: Jo Hendrix, female   DOB: 86 y.o.   MRN: [id number]   HPI States that the left foot has started to bother me more and I know it has been almost 2 years since I have been here   ROS      Objective:  Physical Exam  Neurovascular status intact with inflammation pain in the left forefoot around the interspace and the metatarsal phalangeal joint third over fourth     Assessment:  Inflammatory capsulitis with possibility for nerve impingement despite previous neurectomy     Plan:  Reviewed condition and given her advanced age we are trying to be conservative.  I did go ahead today I did sterile prep I injected the interspace and around the third MPJ 3 mg Dexasone Kenalog 5 mg Xylocaine and advised on wider shoes and reappoint to recheck

## 2020-03-02 NOTE — Telephone Encounter (Signed)
[name] patient regarding changing her diuretics.Marland Kitchen Spoke with her and advised that Dr. Lorenso Courier was able to speak with Dr. Claiborne Billings and he recommended stopping her Lasix and starting Torsemide (Demedex) @ 40 mg daily. Pt voiced understanding.  Prescription escribed to her pharmacy and she stated she will pick up today.  She is aware of her upcoming appts.

## 2020-03-03 ENCOUNTER — Other Ambulatory Visit: Payer: Self-pay | Admitting: Podiatry

## 2020-03-03 ENCOUNTER — Other Ambulatory Visit: Payer: Self-pay | Admitting: Hematology and Oncology

## 2020-03-03 DIAGNOSIS — M778 Other enthesopathies, not elsewhere classified: Secondary | ICD-10-CM

## 2020-03-03 DIAGNOSIS — C3491 Malignant neoplasm of unspecified part of right bronchus or lung: Secondary | ICD-10-CM

## 2020-03-04 ENCOUNTER — Other Ambulatory Visit: Payer: Self-pay

## 2020-03-04 ENCOUNTER — Other Ambulatory Visit: Payer: Self-pay | Admitting: Hematology and Oncology

## 2020-03-04 ENCOUNTER — Inpatient Hospital Stay: Payer: Medicare HMO

## 2020-03-04 ENCOUNTER — Other Ambulatory Visit: Payer: Self-pay | Admitting: Cardiovascular Disease

## 2020-03-04 ENCOUNTER — Inpatient Hospital Stay: Payer: Medicare HMO | Admitting: Hematology and Oncology

## 2020-03-04 ENCOUNTER — Telehealth: Payer: Self-pay | Admitting: Hematology and Oncology

## 2020-03-04 ENCOUNTER — Other Ambulatory Visit: Payer: Self-pay | Admitting: Sports Medicine

## 2020-03-04 ENCOUNTER — Telehealth: Payer: Self-pay | Admitting: Podiatry

## 2020-03-04 VITALS — BP 133/57 | HR 66 | Temp 97.9°F | Resp 18 | Ht 59.0 in | Wt 150.6 lb

## 2020-03-04 DIAGNOSIS — N179 Acute kidney failure, unspecified: Secondary | ICD-10-CM | POA: Diagnosis not present

## 2020-03-04 DIAGNOSIS — C3481 Malignant neoplasm of overlapping sites of right bronchus and lung: Secondary | ICD-10-CM | POA: Diagnosis not present

## 2020-03-04 DIAGNOSIS — R609 Edema, unspecified: Secondary | ICD-10-CM

## 2020-03-04 DIAGNOSIS — D72829 Elevated white blood cell count, unspecified: Secondary | ICD-10-CM | POA: Diagnosis not present

## 2020-03-04 DIAGNOSIS — C3491 Malignant neoplasm of unspecified part of right bronchus or lung: Secondary | ICD-10-CM | POA: Diagnosis not present

## 2020-03-04 LAB — CMP (CANCER CENTER ONLY)
ALT: 11 U/L (ref 0–44)
AST: 16 U/L (ref 15–41)
Albumin: 2.4 g/dL — ABNORMAL LOW (ref 3.5–5.0)
Alkaline Phosphatase: 76 U/L (ref 38–126)
Anion gap: 9 (ref 5–15)
BUN: 29 mg/dL — ABNORMAL HIGH (ref 8–23)
CO2: 29 mmol/L (ref 22–32)
Calcium: 8.5 mg/dL — ABNORMAL LOW (ref 8.9–10.3)
Chloride: 104 mmol/L (ref 98–111)
Creatinine: 1.76 mg/dL — ABNORMAL HIGH (ref 0.44–1.00)
GFR, Est AFR Am: 29 mL/min — ABNORMAL LOW (ref >60)
GFR, Estimated: 25 mL/min — ABNORMAL LOW (ref >60)
Glucose, Bld: 109 mg/dL — ABNORMAL HIGH (ref 70–99)
Potassium: 4 mmol/L (ref 3.5–5.1)
Sodium: 142 mmol/L (ref 135–145)
Total Bilirubin: 0.3 mg/dL (ref 0.3–1.2)
Total Protein: 5.8 g/dL — ABNORMAL LOW (ref 6.5–8.1)

## 2020-03-04 LAB — CBC WITH DIFFERENTIAL (CANCER CENTER ONLY)
Abs Immature Granulocytes: 0.06 10*3/uL (ref 0.00–0.07)
Basophils Absolute: 0 10*3/uL (ref 0.0–0.1)
Basophils Relative: 0 %
Eosinophils Absolute: 0.3 10*3/uL (ref 0.0–0.5)
Eosinophils Relative: 3 %
HCT: 36.4 % (ref 36.0–46.0)
Hemoglobin: 11.9 g/dL — ABNORMAL LOW (ref 12.0–15.0)
Immature Granulocytes: 1 %
Lymphocytes Relative: 14 %
Lymphs Abs: 1.6 10*3/uL (ref 0.7–4.0)
MCH: 30.8 pg (ref 26.0–34.0)
MCHC: 32.7 g/dL (ref 30.0–36.0)
MCV: 94.3 fL (ref 80.0–100.0)
Monocytes Absolute: 0.7 10*3/uL (ref 0.1–1.0)
Monocytes Relative: 6 %
Neutro Abs: 9.1 10*3/uL — ABNORMAL HIGH (ref 1.7–7.7)
Neutrophils Relative %: 76 %
Platelet Count: 200 10*3/uL (ref 150–400)
RBC: 3.86 MIL/uL — ABNORMAL LOW (ref 3.87–5.11)
RDW: 13.5 % (ref 11.5–15.5)
WBC Count: 11.8 10*3/uL — ABNORMAL HIGH (ref 4.0–10.5)
nRBC: 0 % (ref 0.0–0.2)

## 2020-03-04 LAB — LACTATE DEHYDROGENASE: LDH: 193 U/L — ABNORMAL HIGH (ref 98–192)

## 2020-03-04 MED ORDER — PREDNISONE 50 MG PO TABS
ORAL_TABLET | ORAL | 0 refills | Status: DC
Start: 2020-03-04 — End: 2020-04-13

## 2020-03-04 MED ORDER — GABAPENTIN 300 MG PO CAPS: 300.0000 mg | ORAL_CAPSULE | Freq: Every day | ORAL | 3 refills | Status: AC

## 2020-03-04 NOTE — Telephone Encounter (Signed)
Pt called and did not receive gabapetin but the transmission failed can we resend medication please

## 2020-03-04 NOTE — Progress Notes (Signed)
Refilled Gabapentin sent to [facility]

## 2020-03-04 NOTE — Telephone Encounter (Signed)
Scheduled per los. Gave avs and calendar  

## 2020-03-04 NOTE — Progress Notes (Signed)
[place] Telephone:([phone]) [phone]   [phone]  PROGRESS NOTE  Patient Care Team: Buzzy Han, MD as PCP - General (Family Medicine) Troy Sine, MD as PCP - Cardiology (Cardiology)  Hematological/Oncological History HEME/ONC OVERVIEW: 1. Stage IIIB ([id number]) adenocarcinoma of the R lung, unresectable, MET exon 14 splice variant deletion+ -Late 05/2019:  ? A large R lung mass extending from the RUL to RLL with mediastinal invasion, R hilar denopathy on CT; no mets in abdomen ? Questionable small foci in L frontal and R parietal lobes on MRI brain, limited due to motion degradation; repeat MRI still limited by  ? Bronch w/ R lung bx, c/w adenocarcinoma; MET exon 14 splice variant deletion+ ? Large FDG-avid R lung mass with extension to the R hilum and paratracheal LN involvement; no distant mets  -07/2019 - present: palliative capmatinib  ? 12/2019: improving RUL and RLL malignancy and nodal metastasis; no new or metastatic disease   TREATMENT SUMMARY:  07/22/2019 - present: capmatinib, currently on 347m BID   Interval History:  Jo Hendrix 86 y.o. female with medical history significant for metastatic adenocarcinoma of the lung who presents for a follow up visit. The patient's last visit was on 02/05/2020 when she established care with Dr. DLorenso Courier In the interim since the last visit the patient has continued to have issues with whole body edema.  On exam today Mrs. Carducci notes that she has been stable since her last visit.  She has continued Lasix therapy, but this week discontinue it in favor of starting torsemide therapy.  She notes that her weight has been stable, but her appetite has been quite poor.  She notes that she is not been hungry and that nothing looks quite good.  She notes that she did have some congestion and cough earlier this week, but this has subsequently resolved.  Otherwise she has been fatigued, but denies having fevers,  chills, sweats, nausea, vomiting or diarrhea.  A full 10 point ROS is listed below.  MEDICAL HISTORY:  Past Medical History:  Diagnosis Date   ACE-inhibitor cough    COPD (chronic obstructive pulmonary disease) (HCC)    Diastolic dysfunction    History of tobacco use    smoked 25 years   Hypertension    Mild CAD    Mild pulmonary hypertension (HCC)    OSA (obstructive sleep apnea)    Sleep Study 04/07/2007 - AHI during total sleep 21.30/hr, during REM 20.16/hr - BiPAP auto servo-ventilation unit    SURGICAL HISTORY: Past Surgical History:  Procedure Laterality Date   benign lesion removed on lung  2000   BLADDER REPAIR     CARDIAC CATHETERIZATION  05/20/2007   mild coronary obstructive disease with 20% narrowing in prox LAD, diffuse luminal irregularity of 40-50% in mid RCA (Dr. TCorky Downs   Cardiopulmonary Met Test  01/28/2012   with PFTs - FEV1 & FEV1/VC WNL, VC WNL, DLCO WNL; abnormal pulmonary response   Carotid Doppler  04/2011   normal patency   CARPAL TUNNEL RELEASE     x2   CATARACT EXTRACTION, BILATERAL     ENDOBRONCHIAL ULTRASOUND N/A 06/12/2019   Procedure: ENDOBRONCHIAL ULTRASOUND;  Surgeon: SCandee Furbish MD;  Location: [facility];  Service: Endoscopy;  Laterality: N/A;   FINE NEEDLE ASPIRATION  06/12/2019   Procedure: FINE NEEDLE ASPIRATION;  Surgeon: SCandee Furbish MD;  Location: [facility];  Service: Endoscopy;;   GALLBLADDER SURGERY     [facility]  MOTION  09/2011   lexiscan myoview - normal perfusion, EF 77%, low risk   Renal Doppler  05/2007   normal renal arteries    TONSILLECTOMY     TRANSTHORACIC ECHOCARDIOGRAM  2013   EF 50-55%; mild MR; mild TR; mild pulm valve regurg; aortic root sclerosis/calcification   VAGINAL HYSTERECTOMY     VARICOSE VEIN SURGERY     VIDEO BRONCHOSCOPY  06/12/2019   Procedure: VIDEO BRONCHOSCOPY;  Surgeon: Candee Furbish, MD;  Location: [facility];  Service: Endoscopy;;    SOCIAL  HISTORY: Social History   Socioeconomic History   Marital status: Widowed    Spouse name: Not on file   Number of children: 3   Years of education: Not on file   Highest education level: Not on file  Occupational History   Occupation: retired  Tobacco Use   Smoking status: Former Smoker    Packs/day: 0.50    Years: 25.00    Pack years: 12.50    Types: Cigarettes    Quit date: 08/13/1984    Years since quitting: 35.5   Smokeless tobacco: Never Used  Scientific laboratory technician Use: Never used  Substance and Sexual Activity   Alcohol use: No   Drug use: No   Sexual activity: Not on file  Other Topics Concern   Not on file  Social History Narrative   Not on file   Social Determinants of Health   Financial Resource Strain:    Difficulty of Paying Living Expenses:   Food Insecurity:    Worried About Charity fundraiser in the Last Year:    Arboriculturist in the Last Year:   Transportation Needs:    Film/video editor (Medical):    Lack of Transportation (Non-Medical):   Physical Activity:    Days of Exercise per Week:    Minutes of Exercise per Session:   Stress:    Feeling of Stress :   Social Connections:    Frequency of Communication with Friends and Family:    Frequency of Social Gatherings with Friends and Family:    Attends Religious Services:    Active Member of Clubs or Organizations:    Attends Music therapist:    Marital Status:   Intimate Partner Violence:    Fear of Current or Ex-Partner:    Emotionally Abused:    Physically Abused:    Sexually Abused:     FAMILY HISTORY: Family History  Problem Relation Age of Onset   Lung cancer Mother    Suicidality Father    Cancer Sister    Breast cancer Daughter    Stroke Sister     ALLERGIES:  is allergic to acetaminophen, benadryl [diphenhydramine], zetia [ezetimibe], gabapentin, hydrochlorothiazide, ace inhibitors, codeine, erythromycin, etodolac, and  ivp dye [iodinated diagnostic agents].  MEDICATIONS:  Current Outpatient Medications  Medication Sig Dispense Refill   acetaminophen (TYLENOL) 325 MG tablet Take 650 mg by mouth every 6 (six) hours as needed for mild pain or headache.     amLODipine (NORVASC) 10 MG tablet      aspirin 81 MG tablet Take 81 mg by mouth daily.     Calcium 1500 MG tablet Take 1,500 mg by mouth daily.     capmatinib (TABRECTA) 150 MG tablet Take 250 mg by mouth 2 (two) times daily.     cholecalciferol (VITAMIN D) 1000 UNITS tablet Take 1,000 Units by mouth daily. Take 1 tab daily     gabapentin (  NEURONTIN) 300 MG capsule Take 1 capsule (300 mg total) by mouth at bedtime. 90 capsule 3   NON FORMULARY at bedtime. BiPAP     Omega-3 Fatty Acids (FISH OIL) 1000 MG CAPS Take 1,000 mg by mouth daily.      ondansetron (ZOFRAN) 8 MG tablet Take 1 tablet (8 mg total) by mouth every 8 (eight) hours as needed for nausea or vomiting. 30 tablet 2   polyvinyl alcohol (LIQUIFILM TEARS) 1.4 % ophthalmic solution Place 1 drop into both eyes as needed for dry eyes.     potassium chloride SA (KLOR-CON) 20 MEQ tablet Take 1 tablet (20 mEq total) by mouth daily. 30 tablet 1   predniSONE (DELTASONE) 50 MG tablet Take 1 pill 13 hours prior to the CT scan, 1 pill 7 hours prior to the CT scan, and 1 pill 1 hour prior to the CT. For contrast allergy. 3 tablet 0   prochlorperazine (COMPAZINE) 10 MG tablet Take 1 tablet (10 mg total) by mouth every 6 (six) hours as needed for nausea or vomiting. 30 tablet 3   RESTASIS 0.05 % ophthalmic emulsion Place 1 drop into both eyes 2 (two) times daily. Use 1 drop in each eye twice a day     rosuvastatin (CRESTOR) 5 MG tablet TAKE 1 TABLET EVERY OTHER DAY 45 tablet 2   torsemide (DEMADEX) 20 MG tablet Take 2 tablets (40 mg total) by mouth daily. 60 tablet 1   No current facility-administered medications for this visit.    REVIEW OF SYSTEMS:   Constitutional: ( - ) fevers, ( - )   chills , ( - ) night sweats Eyes: ( - ) blurriness of vision, ( - ) double vision, ( - ) watery eyes Ears, nose, mouth, throat, and face: ( - ) mucositis, ( - ) sore throat Respiratory: ( - ) cough, ( - ) dyspnea, ( - ) wheezes Cardiovascular: ( - ) palpitation, ( - ) chest discomfort, ( - ) lower extremity swelling Gastrointestinal:  ( - ) nausea, ( - ) heartburn, ( - ) change in bowel habits Skin: ( - ) abnormal skin rashes Lymphatics: ( - ) new lymphadenopathy, ( - ) easy bruising Neurological: ( - ) numbness, ( - ) tingling, ( - ) new weaknesses Behavioral/Psych: ( - ) mood change, ( - ) new changes  All other systems were reviewed with the patient and are negative.  PHYSICAL EXAMINATION: ECOG PERFORMANCE STATUS: 1 - Symptomatic but completely ambulatory  Vitals:   03/04/20 1134  BP: (!) 133/57  Pulse: 66  Resp: 18  Temp: 97.9 F (36.6 C)  SpO2: 95%   Filed Weights   03/04/20 1134  Weight: 150 lb 9.6 oz (68.3 kg)    GENERAL: well appearing elderly Caucasian female (appears considerably younger than state age) alert, no distress and comfortable SKIN: skin color, texture, turgor are normal, no rashes or significant lesions EYES: conjunctiva are pink and non-injected, sclera clearl LUNGS: clear to auscultation and percussion with normal breathing effort HEART: regular rate & rhythm and no murmurs and + 2 pitting edema in LE bilaterally. +1 in UE.  Musculoskeletal: no cyanosis of digits and no clubbing  PSYCH: alert & oriented x 3, fluent speech NEURO: no focal motor/sensory deficits  LABORATORY DATA:  I have reviewed the data as listed CBC Latest Ref Rng & Units 03/04/2020 02/19/2020 02/05/2020  WBC 4.0 - 10.5 K/uL 11.8(H) 8.6 9.2  Hemoglobin 12.0 - 15.0 g/dL 11.9(L) 11.9(L) 12.9  Hematocrit 36 - 46 % 36.4 36.4 39.4  Platelets 150 - 400 K/uL 200 241 231    CMP Latest Ref Rng & Units 03/04/2020 02/19/2020 02/05/2020  Glucose 70 - 99 mg/dL 109(H) 136(H) 96  BUN 8 - 23 mg/dL  29(H) 34(H) 33(H)  Creatinine 0.44 - 1.00 mg/dL 1.76(H) 2.00(H) 1.99(H)  Sodium 135 - 145 mmol/L 142 139 140  Potassium 3.5 - 5.1 mmol/L 4.0 3.9 3.7  Chloride 98 - 111 mmol/L 104 102 103  CO2 22 - 32 mmol/L _0 Calcium 8.9 - 10.3 mg/dL 8.5(L) 8.2(L) 8.0(L)  Total Protein 6.5 - 8.1 g/dL 5.8(L) 5.5(L) 5.9(L)  Total Bilirubin 0.3 - 1.2 mg/dL 0.3 0.3 0.3  Alkaline Phos 38 - 126 U/L 76 87 81  AST 15 - 41 U/L _1 ALT 0 - 44 U/L _2 RADIOGRAPHIC STUDIES: DG Foot 2 Views Left  Result Date: 03/03/2020 Please see detailed radiograph report in office note.   ASSESSMENT & PLAN Jo Hendrix 86 y.o. female with medical history significant for metastatic adenocarcinoma of the lung who presents for a follow up visit.  After review the labs, review the imaging, discussion with the patient her findings are most consistent with good interval response to capmatinib.  As such I do believe it be worthwhile to continue this medication despite the lower extremity edema.    Other than the continued edema and fatigue the patient is tolerating the medication well without any other clear complications.   The patient has been otherwise quite well with good appetite and no other concerning symptoms.  We will plan to follow her up every 4 weeks while she is on this medication with interval visits if required due to new symptoms or concerns.  #Stage IIIB ([id number]) adenocarcinoma of the R lung, unresectable, MET exon 14 splice variant deletion+ -last CT scan on 12/21/2019 showed stable disease. Plan for repeat in August 2021 -Given that the fluid retention remains overall stable and is not causing significant discomfort/ respiratory issues, I would prefer continuing capmatinib at 300 mg BID  -We will monitor the disease response with CT CAP q28month   ? Of note, patient has a history of contrast allergy, and requires premedication with Benadryl and prednisone (Take Prednisone 50 mg by mouth 13  hours, 7 hours, and 1 hour prior to CT scan.) -Periodic TSH monitoring (next check due)   #Fluid retention -Secondary to capmatinib -Overall stable, but remains uncomfortable.  -Given the AKI, will monitor closely  #Leukocytosis -no signs/symptoms of infection on exam today.  -WBC 11.8 k today, mildly elevated -We will monitor it closely   #AKI -Likely due to mild volume depletion and use of diuretic therapy as above -Cr 1.79 today, improved slightly --transition to torsemide today, hold lasix.  -will monitoring carefully while patient is on torsemide 469mPO daily.   Orders Placed This Encounter  Procedures   CT CHEST ABDOMEN PELVIS W CONTRAST    Standing Status:   Future    Standing Expiration Date:   03/04/2021    Order Specific Question:   Reason for Exam (SYMPTOM  OR DIAGNOSIS REQUIRED)    Answer:   Lung cancer, assess treatment response    Order Specific Question:   Preferred imaging location?    Answer:   [facility]  Order Specific Question:   Radiology Contrast Protocol - do NOT remove file path    Answer:   [email or web address]  All questions were answered. The patient knows to call the clinic with any problems, questions or concerns.  A total of more than 30 minutes were spent on this encounter and over half of that time was spent on counseling and coordination of care as outlined above.   Ledell Peoples, MD Department of Hematology/Oncology [place] [facility] Phone: [phone] Pager: [phone] Email: [email or web address]  03/04/2020 7:30 PM

## 2020-03-04 NOTE — Telephone Encounter (Signed)
Gabapentin sent to [facility]

## 2020-03-07 ENCOUNTER — Telehealth: Payer: Self-pay | Admitting: Hematology and Oncology

## 2020-03-07 NOTE — Telephone Encounter (Signed)
Scheduled per 7/23 sch message. Unable to reach pt. Left voicemail with appt times and dates.

## 2020-03-11 ENCOUNTER — Other Ambulatory Visit: Payer: Self-pay

## 2020-03-11 ENCOUNTER — Inpatient Hospital Stay: Payer: Medicare HMO

## 2020-03-11 ENCOUNTER — Telehealth: Payer: Self-pay | Admitting: *Deleted

## 2020-03-11 DIAGNOSIS — C3481 Malignant neoplasm of overlapping sites of right bronchus and lung: Secondary | ICD-10-CM | POA: Diagnosis not present

## 2020-03-11 DIAGNOSIS — N179 Acute kidney failure, unspecified: Secondary | ICD-10-CM

## 2020-03-11 LAB — CMP (CANCER CENTER ONLY)
ALT: 17 U/L (ref 0–44)
AST: 18 U/L (ref 15–41)
Albumin: 2.5 g/dL — ABNORMAL LOW (ref 3.5–5.0)
Alkaline Phosphatase: 85 U/L (ref 38–126)
Anion gap: 7 (ref 5–15)
BUN: 32 mg/dL — ABNORMAL HIGH (ref 8–23)
CO2: 30 mmol/L (ref 22–32)
Calcium: 8.9 mg/dL (ref 8.9–10.3)
Chloride: 103 mmol/L (ref 98–111)
Creatinine: 1.93 mg/dL — ABNORMAL HIGH (ref 0.44–1.00)
GFR, Est AFR Am: 26 mL/min — ABNORMAL LOW (ref >60)
GFR, Estimated: 22 mL/min — ABNORMAL LOW (ref >60)
Glucose, Bld: 97 mg/dL (ref 70–99)
Potassium: 3.8 mmol/L (ref 3.5–5.1)
Sodium: 140 mmol/L (ref 135–145)
Total Bilirubin: 0.3 mg/dL (ref 0.3–1.2)
Total Protein: 6.2 g/dL — ABNORMAL LOW (ref 6.5–8.1)

## 2020-03-11 LAB — TSH: TSH: 0.9 u[IU]/mL (ref 0.308–3.960)

## 2020-03-11 NOTE — Telephone Encounter (Signed)
-----   Message from Orson Slick, MD sent at 03/11/2020 12:38 PM EDT ----- Please let Jo Hendrix know that her kidney function is stable. Please ask if she has noticed any improvement in her swelling with this new medication.  ----- Message ----- From: Buel Ream, Lab In [place] Sent: 03/11/2020  11:38 AM EDT To: Orson Slick, MD

## 2020-03-11 NOTE — Telephone Encounter (Signed)
[name] patient regarding lab results.  Spoke with her and informed her that her kidney function is stable at this time. She is to continue the current diuretic-Demedex. She states that she still has some swelling in her hands and her feet/ankles. Advised that this is a side effect of the Tabrecta that may not totally resolve.  She voiced understanding. She is aware of her future appts here.

## 2020-03-18 ENCOUNTER — Telehealth: Payer: Self-pay | Admitting: *Deleted

## 2020-03-18 ENCOUNTER — Other Ambulatory Visit: Payer: Self-pay | Admitting: *Deleted

## 2020-03-18 ENCOUNTER — Other Ambulatory Visit: Payer: Self-pay

## 2020-03-18 ENCOUNTER — Inpatient Hospital Stay: Payer: Medicare HMO | Attending: Hematology

## 2020-03-18 DIAGNOSIS — E041 Nontoxic single thyroid nodule: Secondary | ICD-10-CM | POA: Diagnosis not present

## 2020-03-18 DIAGNOSIS — C3481 Malignant neoplasm of overlapping sites of right bronchus and lung: Secondary | ICD-10-CM | POA: Insufficient documentation

## 2020-03-18 DIAGNOSIS — G4730 Sleep apnea, unspecified: Secondary | ICD-10-CM | POA: Insufficient documentation

## 2020-03-18 DIAGNOSIS — I10 Essential (primary) hypertension: Secondary | ICD-10-CM | POA: Diagnosis not present

## 2020-03-18 DIAGNOSIS — Z7982 Long term (current) use of aspirin: Secondary | ICD-10-CM | POA: Insufficient documentation

## 2020-03-18 DIAGNOSIS — Z803 Family history of malignant neoplasm of breast: Secondary | ICD-10-CM | POA: Insufficient documentation

## 2020-03-18 DIAGNOSIS — N179 Acute kidney failure, unspecified: Secondary | ICD-10-CM

## 2020-03-18 DIAGNOSIS — I2510 Atherosclerotic heart disease of native coronary artery without angina pectoris: Secondary | ICD-10-CM | POA: Insufficient documentation

## 2020-03-18 DIAGNOSIS — M7989 Other specified soft tissue disorders: Secondary | ICD-10-CM | POA: Diagnosis not present

## 2020-03-18 DIAGNOSIS — J449 Chronic obstructive pulmonary disease, unspecified: Secondary | ICD-10-CM | POA: Insufficient documentation

## 2020-03-18 DIAGNOSIS — I700 Atherosclerosis of aorta: Secondary | ICD-10-CM | POA: Insufficient documentation

## 2020-03-18 DIAGNOSIS — I272 Pulmonary hypertension, unspecified: Secondary | ICD-10-CM | POA: Diagnosis not present

## 2020-03-18 DIAGNOSIS — Z79899 Other long term (current) drug therapy: Secondary | ICD-10-CM | POA: Insufficient documentation

## 2020-03-18 DIAGNOSIS — C3491 Malignant neoplasm of unspecified part of right bronchus or lung: Secondary | ICD-10-CM

## 2020-03-18 DIAGNOSIS — Z801 Family history of malignant neoplasm of trachea, bronchus and lung: Secondary | ICD-10-CM | POA: Diagnosis not present

## 2020-03-18 DIAGNOSIS — Z87891 Personal history of nicotine dependence: Secondary | ICD-10-CM | POA: Diagnosis not present

## 2020-03-18 LAB — CMP (CANCER CENTER ONLY)
ALT: 15 U/L (ref 0–44)
AST: 19 U/L (ref 15–41)
Albumin: 2.6 g/dL — ABNORMAL LOW (ref 3.5–5.0)
Alkaline Phosphatase: 92 U/L (ref 38–126)
Anion gap: 7 (ref 5–15)
BUN: 35 mg/dL — ABNORMAL HIGH (ref 8–23)
CO2: 30 mmol/L (ref 22–32)
Calcium: 8.8 mg/dL — ABNORMAL LOW (ref 8.9–10.3)
Chloride: 102 mmol/L (ref 98–111)
Creatinine: 2.01 mg/dL — ABNORMAL HIGH (ref 0.44–1.00)
GFR, Est AFR Am: 25 mL/min — ABNORMAL LOW (ref >60)
GFR, Estimated: 21 mL/min — ABNORMAL LOW (ref >60)
Glucose, Bld: 88 mg/dL (ref 70–99)
Potassium: 3.9 mmol/L (ref 3.5–5.1)
Sodium: 139 mmol/L (ref 135–145)
Total Bilirubin: 0.4 mg/dL (ref 0.3–1.2)
Total Protein: 6.4 g/dL — ABNORMAL LOW (ref 6.5–8.1)

## 2020-03-18 LAB — CBC WITH DIFFERENTIAL (CANCER CENTER ONLY)
Abs Immature Granulocytes: 0.1 10*3/uL — ABNORMAL HIGH (ref 0.00–0.07)
Basophils Absolute: 0 10*3/uL (ref 0.0–0.1)
Basophils Relative: 0 %
Eosinophils Absolute: 0.2 10*3/uL (ref 0.0–0.5)
Eosinophils Relative: 1 %
HCT: 39 % (ref 36.0–46.0)
Hemoglobin: 12.8 g/dL (ref 12.0–15.0)
Immature Granulocytes: 1 %
Lymphocytes Relative: 20 %
Lymphs Abs: 2.2 10*3/uL (ref 0.7–4.0)
MCH: 30 pg (ref 26.0–34.0)
MCHC: 32.8 g/dL (ref 30.0–36.0)
MCV: 91.3 fL (ref 80.0–100.0)
Monocytes Absolute: 0.6 10*3/uL (ref 0.1–1.0)
Monocytes Relative: 6 %
Neutro Abs: 8.1 10*3/uL — ABNORMAL HIGH (ref 1.7–7.7)
Neutrophils Relative %: 72 %
Platelet Count: 288 10*3/uL (ref 150–400)
RBC: 4.27 MIL/uL (ref 3.87–5.11)
RDW: 13.8 % (ref 11.5–15.5)
WBC Count: 11.2 10*3/uL — ABNORMAL HIGH (ref 4.0–10.5)
nRBC: 0 % (ref 0.0–0.2)

## 2020-03-18 NOTE — Telephone Encounter (Signed)
-----   Message from Orson Slick, MD sent at 03/18/2020  1:22 PM EDT ----- Please let Jo Hendrix know that her Cr and electrolytes are stable on torsemide therapy. She can continue this at the current dose. Please ask if she notes any improvement of the swelling.  ----- Message ----- From: Buel Ream, Lab In [place] Sent: 03/18/2020  11:36 AM EDT To: Orson Slick, MD

## 2020-03-18 NOTE — Telephone Encounter (Signed)
[name] patient regarding recent lab results. No answer but was able to leave detailed message on identified phone # [phone] . Advised that her labs , specifically her kidney function was stable and she is to continue her current does of torsemide.  Advised to call [phone] with any questions or concerns

## 2020-03-25 ENCOUNTER — Other Ambulatory Visit: Payer: Self-pay

## 2020-03-25 ENCOUNTER — Inpatient Hospital Stay: Payer: Medicare HMO

## 2020-03-25 DIAGNOSIS — C3481 Malignant neoplasm of overlapping sites of right bronchus and lung: Secondary | ICD-10-CM | POA: Diagnosis not present

## 2020-03-25 DIAGNOSIS — N179 Acute kidney failure, unspecified: Secondary | ICD-10-CM

## 2020-03-25 LAB — CMP (CANCER CENTER ONLY)
ALT: 11 U/L (ref 0–44)
AST: 15 U/L (ref 15–41)
Albumin: 2.3 g/dL — ABNORMAL LOW (ref 3.5–5.0)
Alkaline Phosphatase: 81 U/L (ref 38–126)
Anion gap: 5 (ref 5–15)
BUN: 37 mg/dL — ABNORMAL HIGH (ref 8–23)
CO2: 28 mmol/L (ref 22–32)
Calcium: 8.2 mg/dL — ABNORMAL LOW (ref 8.9–10.3)
Chloride: 106 mmol/L (ref 98–111)
Creatinine: 2.07 mg/dL — ABNORMAL HIGH (ref 0.44–1.00)
GFR, Est AFR Am: 24 mL/min — ABNORMAL LOW (ref >60)
GFR, Estimated: 20 mL/min — ABNORMAL LOW (ref >60)
Glucose, Bld: 107 mg/dL — ABNORMAL HIGH (ref 70–99)
Potassium: 4.2 mmol/L (ref 3.5–5.1)
Sodium: 139 mmol/L (ref 135–145)
Total Bilirubin: 0.3 mg/dL (ref 0.3–1.2)
Total Protein: 5.7 g/dL — ABNORMAL LOW (ref 6.5–8.1)

## 2020-03-29 ENCOUNTER — Telehealth: Payer: Self-pay

## 2020-03-29 NOTE — Telephone Encounter (Signed)
Jo Hendrix called wanting to know if it is ok for her to get the covid booster shot.

## 2020-03-31 ENCOUNTER — Other Ambulatory Visit: Payer: Self-pay | Admitting: *Deleted

## 2020-03-31 DIAGNOSIS — C3491 Malignant neoplasm of unspecified part of right bronchus or lung: Secondary | ICD-10-CM

## 2020-04-01 ENCOUNTER — Other Ambulatory Visit: Payer: Self-pay

## 2020-04-01 ENCOUNTER — Inpatient Hospital Stay: Payer: Medicare HMO

## 2020-04-01 DIAGNOSIS — C3481 Malignant neoplasm of overlapping sites of right bronchus and lung: Secondary | ICD-10-CM | POA: Diagnosis not present

## 2020-04-01 DIAGNOSIS — C3491 Malignant neoplasm of unspecified part of right bronchus or lung: Secondary | ICD-10-CM

## 2020-04-01 LAB — CMP (CANCER CENTER ONLY)
ALT: 11 U/L (ref 0–44)
AST: 12 U/L — ABNORMAL LOW (ref 15–41)
Albumin: 2.6 g/dL — ABNORMAL LOW (ref 3.5–5.0)
Alkaline Phosphatase: 81 U/L (ref 38–126)
Anion gap: 5 (ref 5–15)
BUN: 45 mg/dL — ABNORMAL HIGH (ref 8–23)
CO2: 32 mmol/L (ref 22–32)
Calcium: 9 mg/dL (ref 8.9–10.3)
Chloride: 103 mmol/L (ref 98–111)
Creatinine: 2.11 mg/dL — ABNORMAL HIGH (ref 0.44–1.00)
GFR, Est AFR Am: 23 mL/min — ABNORMAL LOW (ref >60)
GFR, Estimated: 20 mL/min — ABNORMAL LOW (ref >60)
Glucose, Bld: 89 mg/dL (ref 70–99)
Potassium: 3.9 mmol/L (ref 3.5–5.1)
Sodium: 140 mmol/L (ref 135–145)
Total Bilirubin: 0.3 mg/dL (ref 0.3–1.2)
Total Protein: 6 g/dL — ABNORMAL LOW (ref 6.5–8.1)

## 2020-04-04 ENCOUNTER — Other Ambulatory Visit: Payer: Self-pay | Admitting: Hematology and Oncology

## 2020-04-04 ENCOUNTER — Ambulatory Visit: Payer: Medicare HMO | Admitting: Cardiovascular Disease

## 2020-04-04 DIAGNOSIS — C3490 Malignant neoplasm of unspecified part of unspecified bronchus or lung: Secondary | ICD-10-CM

## 2020-04-05 ENCOUNTER — Other Ambulatory Visit: Payer: Self-pay

## 2020-04-05 ENCOUNTER — Ambulatory Visit (HOSPITAL_COMMUNITY)
Admission: RE | Admit: 2020-04-05 | Discharge: 2020-04-05 | Disposition: A | Discharge status: Home / Self Care | Payer: Medicare HMO | Source: Ambulatory Visit | Attending: Hematology and Oncology | Admitting: Hematology and Oncology

## 2020-04-05 DIAGNOSIS — C3490 Malignant neoplasm of unspecified part of unspecified bronchus or lung: Secondary | ICD-10-CM | POA: Insufficient documentation

## 2020-04-07 ENCOUNTER — Other Ambulatory Visit: Payer: Self-pay | Admitting: Hematology and Oncology

## 2020-04-07 DIAGNOSIS — C3491 Malignant neoplasm of unspecified part of right bronchus or lung: Secondary | ICD-10-CM

## 2020-04-08 ENCOUNTER — Encounter: Payer: Self-pay | Admitting: *Deleted

## 2020-04-08 ENCOUNTER — Inpatient Hospital Stay: Payer: Medicare HMO | Admitting: Hematology and Oncology

## 2020-04-08 ENCOUNTER — Inpatient Hospital Stay: Payer: Medicare HMO

## 2020-04-08 ENCOUNTER — Other Ambulatory Visit: Payer: Self-pay

## 2020-04-08 VITALS — BP 129/69 | HR 72 | Temp 98.1°F | Resp 18 | Ht 59.0 in | Wt 152.7 lb

## 2020-04-08 DIAGNOSIS — C3491 Malignant neoplasm of unspecified part of right bronchus or lung: Secondary | ICD-10-CM

## 2020-04-08 DIAGNOSIS — N179 Acute kidney failure, unspecified: Secondary | ICD-10-CM

## 2020-04-08 DIAGNOSIS — C3481 Malignant neoplasm of overlapping sites of right bronchus and lung: Secondary | ICD-10-CM

## 2020-04-08 DIAGNOSIS — C3490 Malignant neoplasm of unspecified part of unspecified bronchus or lung: Secondary | ICD-10-CM

## 2020-04-08 LAB — CBC WITH DIFFERENTIAL (CANCER CENTER ONLY)
Abs Immature Granulocytes: 0.07 10*3/uL (ref 0.00–0.07)
Basophils Absolute: 0 10*3/uL (ref 0.0–0.1)
Basophils Relative: 0 %
Eosinophils Absolute: 0.2 10*3/uL (ref 0.0–0.5)
Eosinophils Relative: 2 %
HCT: 37.4 % (ref 36.0–46.0)
Hemoglobin: 12 g/dL (ref 12.0–15.0)
Immature Granulocytes: 1 %
Lymphocytes Relative: 29 %
Lymphs Abs: 2.6 10*3/uL (ref 0.7–4.0)
MCH: 30 pg (ref 26.0–34.0)
MCHC: 32.1 g/dL (ref 30.0–36.0)
MCV: 93.5 fL (ref 80.0–100.0)
Monocytes Absolute: 0.8 10*3/uL (ref 0.1–1.0)
Monocytes Relative: 9 %
Neutro Abs: 5.2 10*3/uL (ref 1.7–7.7)
Neutrophils Relative %: 59 %
Platelet Count: 218 10*3/uL (ref 150–400)
RBC: 4 MIL/uL (ref 3.87–5.11)
RDW: 14.6 % (ref 11.5–15.5)
WBC Count: 8.9 10*3/uL (ref 4.0–10.5)
nRBC: 0 % (ref 0.0–0.2)

## 2020-04-08 LAB — CMP (CANCER CENTER ONLY)
ALT: 12 U/L (ref 0–44)
AST: 14 U/L — ABNORMAL LOW (ref 15–41)
Albumin: 2.6 g/dL — ABNORMAL LOW (ref 3.5–5.0)
Alkaline Phosphatase: 82 U/L (ref 38–126)
Anion gap: 4 — ABNORMAL LOW (ref 5–15)
BUN: 36 mg/dL — ABNORMAL HIGH (ref 8–23)
CO2: 33 mmol/L — ABNORMAL HIGH (ref 22–32)
Calcium: 8.8 mg/dL — ABNORMAL LOW (ref 8.9–10.3)
Chloride: 103 mmol/L (ref 98–111)
Creatinine: 2.04 mg/dL — ABNORMAL HIGH (ref 0.44–1.00)
GFR, Est AFR Am: 24 mL/min — ABNORMAL LOW (ref >60)
GFR, Estimated: 21 mL/min — ABNORMAL LOW (ref >60)
Glucose, Bld: 106 mg/dL — ABNORMAL HIGH (ref 70–99)
Potassium: 3.5 mmol/L (ref 3.5–5.1)
Sodium: 140 mmol/L (ref 135–145)
Total Bilirubin: 0.4 mg/dL (ref 0.3–1.2)
Total Protein: 6 g/dL — ABNORMAL LOW (ref 6.5–8.1)

## 2020-04-08 LAB — TSH: TSH: 1.011 u[IU]/mL (ref 0.308–3.960)

## 2020-04-08 NOTE — Progress Notes (Signed)
Per Dr. Lorenso Courier, I followed up with path dept to see if PDL 1 was sent on NSCLC.  I was updated that it was not sent. I requested per Dr. Lorenso Courier and he is updated.

## 2020-04-08 NOTE — Progress Notes (Signed)
I received a call from Dr. Lorenso Courier asking about patient's PDL 1 status.  I could not find this information in the chart. I contacted [facility] for assistance.

## 2020-04-12 ENCOUNTER — Encounter: Payer: Self-pay | Admitting: Hematology and Oncology

## 2020-04-12 ENCOUNTER — Other Ambulatory Visit: Payer: Self-pay

## 2020-04-12 NOTE — Progress Notes (Signed)
[place] [phone]   Fax:([phone]) [phone]  PROGRESS NOTE  Patient Care Team: Buzzy Han, MD as PCP - General (Family Medicine) Troy Sine, MD as PCP - Cardiology (Cardiology)  Hematological/Oncological History HEME/ONC OVERVIEW: 1. 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 A large R lung mass extending from the RUL to RLL with mediastinal invasion, R hilar denopathy on CT; no mets in abdomen ? Questionable small foci in L frontal and R parietal lobes on MRI brain, limited due to motion degradation; repeat MRI still limited by  ? Bronch w/ R lung bx, c/w adenocarcinoma; MET exon 14 splice variant deletion+ ? Large FDG-avid R lung mass with extension to the R hilum and paratracheal LN involvement; no distant mets  -07/2019 - present: palliative capmatinib  ? 12/2019: improving RUL and RLL malignancy and nodal metastasis; no new or metastatic disease   TREATMENT SUMMARY:  07/22/2019 - 8/27/2021t: capmatinib, currently on 353m BID   Interval History:  Jo Hendrix 86y.o. female with medical history significant for metastatic adenocarcinoma of the lung who presents for a follow up visit. The patient's last visit was on 03/04/2020. In the interim since the last visit she has had a CT scan which shows interval progression of the lung cancer.   On exam today Jo Hendrix notes she has been well in the interval since our last visit.  She notes that she has been taking the torsemide and has noticed some modest improvement in her extremity swelling.  She notes she has not had any increase shortness of breath or cough in the interval since our last visit.  Other than the swelling in her extremities she is not had any issues with shortness of breath, cough, or vision changes.  She denies having any issues with fevers, chills, sweats, nausea, vomiting or diarrhea.  Full 10  point ROS is listed below.  The bulk of our visit today focused on the CT scan findings and the plan moving forward.  This discussion is detailed below.  MEDICAL HISTORY:  Past Medical History:  Diagnosis Date  . ACE-inhibitor cough   . COPD (ch[place] obstructive pulmonary disease[place]   . Diastolic dysfunction   . History of tobacco use    smoked 25 years  . Hypertension   . Mild CAD   . Mild pulmonary hypertension [place]   . OSA (obstructive sleep apnea)    Sleep Study 04/07/2007 - AHI during total sleep 21.30/hr, during REM 20.16/hr - BiPAP auto servo-ventilation unit    SURGICAL HISTORY: Past Surgical History:  Procedure Laterality Date  . benign lesion removed on lung  2000  . BLADDER REPAIR    . CARDIAC CATHETERIZATION  05/20/2007   mild coronary obstructive disease with 20% narrowing in prox LAD, diffuse luminal irregularity of 40-50% in mid RCA (Dr. TCorky Downs  . Cardiopulmonary Met Test  01/28/2012   with PFTs - FEV1 & FEV1/VC WNL, VC WNL, DLCO WNL; abnormal pulmonary response  . Carotid Doppler  04/2011   normal patency  . CARPAL TUNNEL RELEASE     x2  . CATARACT EXTRACTION, BILATERAL    . ENDOBRONCHIAL ULTRASOUND N/A 06/12/2019   Procedure: ENDOBRONCHIAL ULTRASOUND;  Surgeon: SCandee Furbish MD;  Location: [facility];  Service: Endoscopy;  Laterality: N/A;  . FINE NEEDLE ASPIRATION  06/12/2019   Procedure: FINE NEEDLE ASPIRATION;  Surgeon: SCandee Furbish MD;  Location: [facility];  Service: Endoscopy;;  . GALLBLADDER SURGERY    . NM MYOCAR PERF WALL MOTION  09/2011   lexiscan myoview - normal perfusion, EF 77%, low risk  . Renal Doppler  05/2007   normal renal arteries   . TONSILLECTOMY    . TRANSTHORACIC ECHOCARDIOGRAM  2013   EF 50-55%; mild MR; mild TR; mild pulm valve regurg; aortic root sclerosis/calcification  . VAGINAL HYSTERECTOMY    . VARICOSE VEIN SURGERY    . VIDEO BRONCHOSCOPY  06/12/2019   Procedure: VIDEO BRONCHOSCOPY;  Surgeon: Candee Furbish, MD;  Location: [facility];  Service: Endoscopy;;    SOCIAL HISTORY: Social History   Socioeconomic History  . Marital status: Widowed    Spouse name: Not on file  . Number of children: 3  . Years of education: Not on file  . Highest education level: Not on file  Occupational History  . Occupation: retired  Tobacco Use  . Smoking status: Former Smoker    Packs/day: 0.50    Years: 25.00    Pack years: 12.50    Types: Cigarettes    Quit date: 08/13/1984    Years since quitting: 35.6  . Smokeless tobacco: Never Used  Vaping Use  . Vaping Use: Never used  Substance and Sexual Activity  . Alcohol use: No  . Drug use: No  . Sexual activity: Not on file  Other Topics Concern  . Not on file  Social History Narrative  . Not on file   Social Determinants of Health   Financial Resource Strain:   . Difficulty of Paying Living Expenses: Not on file  Food Insecurity:   . Worried About Charity fundraiser in the Last Year: Not on file  . Ran Out of Food in the Last Year: Not on file  Transportation Needs:   . Lack of Transportation (Medical): Not on file  . Lack of Transportation (Non-Medical): Not on file  Physical Activity:   . Days of Exercise per Week: Not on file  . Minutes of Exercise per Session: Not on file  Stress:   . Feeling of Stress : Not on file  Social Connections:   . Frequency of Communication with Friends and Family: Not on file  . Frequency of Social Gatherings with Friends and Family: Not on file  . Attends Religious Services: Not on file  . Active Member of Clubs or Organizations: Not on file  . Attends Archivist Meetings: Not on file  . Marital Status: Not on file  Intimate Partner Violence:   . Fear of Current or Ex-Partner: Not on file  . Emotionally Abused: Not on file  . Physically Abused: Not on file  . Sexually Abused: Not on file    FAMILY HISTORY: Family History  Problem Relation Age of Onset  . Lung cancer Mother   .  Suicidality Father   . Cancer Sister   . Breast cancer Daughter   . Stroke Sister     ALLERGIES:  is allergic to acetaminophen, benadryl [diphenhydramine], zetia [ezetimibe], gabapentin, hydrochlorothiazide, ace inhibitors, codeine, erythromycin, etodolac, and ivp dye [iodinated diagnostic agents].  MEDICATIONS:  Current Outpatient Medications  Medication Sig Dispense Refill  . acetaminophen (TYLENOL) 325 MG tablet Take 650 mg by mouth every 6 (six) hours as needed for mild pain or headache.    Marland Kitchen amLODipine (NORVASC) 10 MG tablet     . aspirin 81 MG tablet Take 81 mg by mouth daily.    . Calcium 1500 MG  tablet Take 1,500 mg by mouth daily.    . capmatinib (TABRECTA) 150 MG tablet Take 250 mg by mouth 2 (two) times daily.    . cholecalciferol (VITAMIN D) 1000 UNITS tablet Take 1,000 Units by mouth daily. Take 1 tab daily    . gabapentin (NEURONTIN) 300 MG capsule Take 1 capsule (300 mg total) by mouth at bedtime. 90 capsule 3  . NON FORMULARY at bedtime. BiPAP    . Omega-3 Fatty Acids (FISH OIL) 1000 MG CAPS Take 1,000 mg by mouth daily.     . ondansetron (ZOFRAN) 8 MG tablet Take 1 tablet (8 mg total) by mouth every 8 (eight) hours as needed for nausea or vomiting. 30 tablet 2  . polyvinyl alcohol (LIQUIFILM TEARS) 1.4 % ophthalmic solution Place 1 drop into both eyes as needed for dry eyes.    . potassium chloride SA (KLOR-CON) 20 MEQ tablet Take 1 tablet (20 mEq total) by mouth daily. 30 tablet 1  . predniSONE (DELTASONE) 50 MG tablet Take 1 pill 13 hours prior to the CT scan, 1 pill 7 hours prior to the CT scan, and 1 pill 1 hour prior to the CT. For contrast allergy. 3 tablet 0  . prochlorperazine (COMPAZINE) 10 MG tablet Take 1 tablet (10 mg total) by mouth every 6 (six) hours as needed for nausea or vomiting. 30 tablet 3  . RESTASIS 0.05 % ophthalmic emulsion Place 1 drop into both eyes 2 (two) times daily. Use 1 drop in each eye twice a day    . rosuvastatin (CRESTOR) 5 MG tablet  TAKE 1 TABLET EVERY OTHER DAY 45 tablet 2  . torsemide (DEMADEX) 20 MG tablet Take 2 tablets (40 mg total) by mouth daily. 60 tablet 1   No current facility-administered medications for this visit.    REVIEW OF SYSTEMS:   Constitutional: ( - ) fevers, ( - )  chills , ( - ) night sweats Eyes: ( - ) blurriness of vision, ( - ) double vision, ( - ) watery eyes Ears, nose, mouth, throat, and face: ( - ) mucositis, ( - ) sore throat Respiratory: ( - ) cough, ( - ) dyspnea, ( - ) wheezes Cardiovascular: ( - ) palpitation, ( - ) chest discomfort, ( - ) lower extremity swelling Gastrointestinal:  ( - ) nausea, ( - ) heartburn, ( - ) change in bowel habits Skin: ( - ) abnormal skin rashes Lymphatics: ( - ) new lymphadenopathy, ( - ) easy bruising Neurological: ( - ) numbness, ( - ) tingling, ( - ) new weaknesses Behavioral/Psych: ( - ) mood change, ( - ) new changes  All other systems were reviewed with the patient and are negative.  PHYSICAL EXAMINATION: ECOG PERFORMANCE STATUS: 1 - Symptomatic but completely ambulatory  Vitals:   04/08/20 1337  BP: 129/69  Pulse: 72  Resp: 18  Temp: 98.1 F (36.7 C)  SpO2: 99%   Filed Weights   04/08/20 1337  Weight: 152 lb 11.2 oz (69.3 kg)    GENERAL: well appearing elderly Caucasian female (appears considerably younger than state age) alert, no distress and comfortable SKIN: skin color, texture, turgor are normal, no rashes or significant lesions EYES: conjunctiva are pink and non-injected, sclera clearl LUNGS: clear to auscultation and percussion with normal breathing effort HEART: regular rate & rhythm and no murmurs and + 2 pitting edema in LE bilaterally. +1 in UE.  Musculoskeletal: no cyanosis of digits and no clubbing  PSYCH: alert &  oriented x 3, fluent speech NEURO: no focal motor/sensory deficits  LABORATORY DATA:  I have reviewed the data as listed CBC Latest Ref Rng & Units 04/08/2020 03/18/2020 03/04/2020  WBC 4.0 - 10.5 K/uL 8.9  11.2(H) 11.8(H)  Hemoglobin 12.0 - 15.0 g/dL 12.0 12.8 11.9(L)  Hematocrit 36 - 46 % 37.4 39.0 36.4  Platelets 150 - 400 K/uL 218 288 200    CMP Latest Ref Rng & Units 04/08/2020 04/01/2020 03/25/2020  Glucose 70 - 99 mg/dL 106(H) 89 107(H)  BUN 8 - 23 mg/dL 36(H) 45(H) 37(H)  Creatinine 0.44 - 1.00 mg/dL 2.04(H) 2.11(H) 2.07(H)  Sodium 135 - 145 mmol/L 140 140 139  Potassium 3.5 - 5.1 mmol/L 3.5 3.9 4.2  Chloride 98 - 111 mmol/L 103 103 106  CO2 22 - 32 mmol/L 33(H) 32 28  Calcium 8.9 - 10.3 mg/dL 8.8(L) 9.0 8.2(L)  Total Protein 6.5 - 8.1 g/dL 6.0(L) 6.0(L) 5.7(L)  Total Bilirubin 0.3 - 1.2 mg/dL 0.4 0.3 0.3  Alkaline Phos 38 - 126 U/L 82 81 81  AST 15 - 41 U/L 14(L) 12(L) 15  ALT 0 - 44 U/L '12 11 11    ' RADIOGRAPHIC STUDIES: I have personally reviewed the imaging and agree with the radiologists assessment: interval progression of the lung mass with on other clear sites of disease.   CT Abdomen Pelvis Wo Contrast  Result Date: 04/05/2020 CLINICAL DATA:  Squamous cell carcinoma of the right lung. Restaging. EXAM: CT CHEST, ABDOMEN AND PELVIS WITHOUT CONTRAST TECHNIQUE: Multidetector CT imaging of the chest, abdomen and pelvis was performed following the standard protocol without IV contrast. COMPARISON:  12/21/2019 FINDINGS: CT CHEST FINDINGS Cardiovascular: The heart size is normal. No substantial pericardial effusion. Coronary artery calcification is evident. Atherosclerotic calcification is noted in the wall of the thoracic aorta. Mediastinum/Nodes: No mediastinal lymphadenopathy. No evidence for gross hilar lymphadenopathy although assessment is limited by the lack of intravenous contrast on today's study. The esophagus has normal imaging features. Complex right thyroid nodule has progressed in the interval measuring 2.1 x 1.9 cm today compared to 1.4 x 1.3 cm previously. There is no axillary lymphadenopathy. Lungs/Pleura: Centrilobular emphsyema noted. Interval progression of  consolidative opacity in the right suprahilar upper lobe. The central lucency seen on the previous study has now become confluent with associated right upper lobe volume loss. Architectural distortion with bronchiectasis and interstitial/airspace opacity in the retro hilar right lower lobe is similar to prior and likely related to radiation therapy. Staple line noted posterior left upper lobe with stable architectural distortion/scarring. 4 mm left lower lobe perifissural nodule on 88/6 is stable. Staple line also noted posterior left lower lobe. Musculoskeletal: No worrisome lytic or sclerotic osseous abnormality. CT ABDOMEN PELVIS FINDINGS Hepatobiliary: No focal abnormality in the liver on this study without intravenous contrast. Gallbladder is surgically absent. No intrahepatic or extrahepatic biliary dilation. Pancreas: No focal mass lesion. No dilatation of the main duct. No intraparenchymal cyst. No peripancreatic edema. Spleen: No splenomegaly. No focal mass lesion. Adrenals/Urinary Tract: No adrenal nodule or mass. 16 mm exophytic lesion upper pole right kidney is similar in size to prior study and measures water density today, most likely a cyst. Similar appearance exophytic cyst lower pole left kidney measuring 16 mm. No evidence for hydroureter. The urinary bladder appears normal for the degree of distention. Stomach/Bowel: Stomach is unremarkable. No gastric wall thickening. No evidence of outlet obstruction. Duodenum is normally positioned as is the ligament of Treitz. No small bowel wall thickening. No  small bowel dilatation. The terminal ileum is normal. The appendix is not visualized, but there is no edema or inflammation in the region of the cecum. No gross colonic mass. No colonic wall thickening. Diverticular changes are noted in the left colon without evidence of diverticulitis. Vascular/Lymphatic: There is abdominal aortic atherosclerosis without aneurysm. There is no gastrohepatic or  hepatoduodenal ligament lymphadenopathy. No retroperitoneal or mesenteric lymphadenopathy. No pelvic sidewall lymphadenopathy. Reproductive: The uterus is surgically absent. There is no adnexal mass. Other: No intraperitoneal free fluid. Musculoskeletal: No worrisome lytic or sclerotic osseous abnormality. IMPRESSION: 1. Interval progression of consolidative opacity in the right suprahilar upper lobe with associated right upper lobe volume loss. The central lucency seen on the previous study has now become confluent with associated right upper lobe volume loss. Imaging features are likely related to evolving radiation fibrosis, but close attention on follow-up recommended. 2. Previously described right paraesophageal and suprahilar lymphadenopathy not well demonstrated on today's noncontrast exam. Soft tissue attenuation in these regions is similar to prior. 3. Stable appearance of post treatment changes in the left lung. 4. No evidence for metastatic disease in the abdomen or pelvis. 5. Complex right thyroid nodule has progressed in the interval. Recommend thyroid ultrasound (ref: J Am Coll Radiol. 2015 Feb;12(2): 143-50). 6. Aortic Atherosclerosis (ICD10-I70.0) and Emphysema (ICD10-J43.9). Electronically Signed   By: Misty Stanley M.D.   On: 04/05/2020 15:29   CT Chest Wo Contrast  Result Date: 04/05/2020 CLINICAL DATA:  Squamous cell carcinoma of the right lung. Restaging. EXAM: CT CHEST, ABDOMEN AND PELVIS WITHOUT CONTRAST TECHNIQUE: Multidetector CT imaging of the chest, abdomen and pelvis was performed following the standard protocol without IV contrast. COMPARISON:  12/21/2019 FINDINGS: CT CHEST FINDINGS Cardiovascular: The heart size is normal. No substantial pericardial effusion. Coronary artery calcification is evident. Atherosclerotic calcification is noted in the wall of the thoracic aorta. Mediastinum/Nodes: No mediastinal lymphadenopathy. No evidence for gross hilar lymphadenopathy although  assessment is limited by the lack of intravenous contrast on today's study. The esophagus has normal imaging features. Complex right thyroid nodule has progressed in the interval measuring 2.1 x 1.9 cm today compared to 1.4 x 1.3 cm previously. There is no axillary lymphadenopathy. Lungs/Pleura: Centrilobular emphsyema noted. Interval progression of consolidative opacity in the right suprahilar upper lobe. The central lucency seen on the previous study has now become confluent with associated right upper lobe volume loss. Architectural distortion with bronchiectasis and interstitial/airspace opacity in the retro hilar right lower lobe is similar to prior and likely related to radiation therapy. Staple line noted posterior left upper lobe with stable architectural distortion/scarring. 4 mm left lower lobe perifissural nodule on 88/6 is stable. Staple line also noted posterior left lower lobe. Musculoskeletal: No worrisome lytic or sclerotic osseous abnormality. CT ABDOMEN PELVIS FINDINGS Hepatobiliary: No focal abnormality in the liver on this study without intravenous contrast. Gallbladder is surgically absent. No intrahepatic or extrahepatic biliary dilation. Pancreas: No focal mass lesion. No dilatation of the main duct. No intraparenchymal cyst. No peripancreatic edema. Spleen: No splenomegaly. No focal mass lesion. Adrenals/Urinary Tract: No adrenal nodule or mass. 16 mm exophytic lesion upper pole right kidney is similar in size to prior study and measures water density today, most likely a cyst. Similar appearance exophytic cyst lower pole left kidney measuring 16 mm. No evidence for hydroureter. The urinary bladder appears normal for the degree of distention. Stomach/Bowel: Stomach is unremarkable. No gastric wall thickening. No evidence of outlet obstruction. Duodenum is normally positioned as is the  ligament of Treitz. No small bowel wall thickening. No small bowel dilatation. The terminal ileum is normal.  The appendix is not visualized, but there is no edema or inflammation in the region of the cecum. No gross colonic mass. No colonic wall thickening. Diverticular changes are noted in the left colon without evidence of diverticulitis. Vascular/Lymphatic: There is abdominal aortic atherosclerosis without aneurysm. There is no gastrohepatic or hepatoduodenal ligament lymphadenopathy. No retroperitoneal or mesenteric lymphadenopathy. No pelvic sidewall lymphadenopathy. Reproductive: The uterus is surgically absent. There is no adnexal mass. Other: No intraperitoneal free fluid. Musculoskeletal: No worrisome lytic or sclerotic osseous abnormality. IMPRESSION: 1. Interval progression of consolidative opacity in the right suprahilar upper lobe with associated right upper lobe volume loss. The central lucency seen on the previous study has now become confluent with associated right upper lobe volume loss. Imaging features are likely related to evolving radiation fibrosis, but close attention on follow-up recommended. 2. Previously described right paraesophageal and suprahilar lymphadenopathy not well demonstrated on today's noncontrast exam. Soft tissue attenuation in these regions is similar to prior. 3. Stable appearance of post treatment changes in the left lung. 4. No evidence for metastatic disease in the abdomen or pelvis. 5. Complex right thyroid nodule has progressed in the interval. Recommend thyroid ultrasound (ref: J Am Coll Radiol. 2015 Feb;12(2): 143-50). 6. Aortic Atherosclerosis (ICD10-I70.0) and Emphysema (ICD10-J43.9). Electronically Signed   By: Misty Stanley M.D.   On: 04/05/2020 15:29    ASSESSMENT & PLAN Jo Hendrix 86 y.o. female with medical history significant for metastatic adenocarcinoma of the lung who presents for a follow up visit.  After review the labs, review the imaging, discussion with the patient her findings are most consistent with interval progression of disease while on  capmatinib.  As such we will discontinue this therapy and send her tissue for PD-L1 status to determine if further treatment with immunotherapy would be viable.   Symptomatically the patient has been well, with the exception of edema in her extremities. She has ahd good appetite, stable weight, and no other concerning symptoms such as shortness of breath, chest pain.  We will plan to have her return once the PD-L1 status becomes available to discuss options moving forward.   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 This, coupled with the patient's poor tolerance is a good indication to HOLD further capmatinib therapy.  - currently having the tissue sent for PD-L1 status. This patient is highly functional for her age and would be a good candidate for pembrolizumab monotherapy.  -if PD-L1 status is not high enough to merit treatment with pembrolizumab I would recommend comfort based care/hospice.  ? Of note, patient has a history of contrast allergy, and requires premedication with Benadryl and prednisone -Periodic TSH monitoring   #Fluid retention -Secondary to capmatinib, which has been discontinued -Overall stable, but remains uncomfortable.  -Given the AKI, will monitor closely  #Leukocytosis, resolved -Secondary to steroid -WBC 8.9k today, stable -We will monitor it closely   #AKI -Likely due to mild volume depletion and use of diuretic therapy as above -Cr 2.07 today, mildly elevated -will monitoring carefully while patient is on lasix  No orders of the defined types were placed in this encounter.   All questions were answered. The patient knows to call the clinic with any problems, questions or concerns.  A total of more than 30 minutes were spent on  this encounter and over half of that time was spent on counseling and coordination of care as outlined above.     Jo Peoples, MD Department of Hematology/Oncology [place] [facility] Phone: [phone] Pager: [phone] Email: [email or web address]  04/12/2020 4:03 PM

## 2020-04-13 ENCOUNTER — Other Ambulatory Visit: Payer: Self-pay

## 2020-04-13 ENCOUNTER — Ambulatory Visit: Payer: Medicare HMO | Admitting: Cardiovascular Disease

## 2020-04-13 ENCOUNTER — Telehealth: Payer: Self-pay | Admitting: Hematology and Oncology

## 2020-04-13 ENCOUNTER — Encounter: Payer: Self-pay | Admitting: Cardiovascular Disease

## 2020-04-13 DIAGNOSIS — I10 Essential (primary) hypertension: Secondary | ICD-10-CM

## 2020-04-13 DIAGNOSIS — I2510 Atherosclerotic heart disease of native coronary artery without angina pectoris: Secondary | ICD-10-CM

## 2020-04-13 DIAGNOSIS — N184 Chronic kidney disease, stage 4 (severe): Secondary | ICD-10-CM

## 2020-04-13 DIAGNOSIS — G4733 Obstructive sleep apnea (adult) (pediatric): Secondary | ICD-10-CM

## 2020-04-13 DIAGNOSIS — I5189 Other ill-defined heart diseases: Secondary | ICD-10-CM

## 2020-04-13 DIAGNOSIS — E041 Nontoxic single thyroid nodule: Secondary | ICD-10-CM | POA: Diagnosis not present

## 2020-04-13 DIAGNOSIS — I519 Heart disease, unspecified: Secondary | ICD-10-CM | POA: Diagnosis not present

## 2020-04-13 DIAGNOSIS — C3491 Malignant neoplasm of unspecified part of right bronchus or lung: Secondary | ICD-10-CM

## 2020-04-13 DIAGNOSIS — R609 Edema, unspecified: Secondary | ICD-10-CM

## 2020-04-13 MED ORDER — TORSEMIDE 20 MG PO TABS
20.0000 mg | ORAL_TABLET | ORAL | 3 refills | Status: DC | PRN
Start: 2020-04-13 — End: 2020-05-13

## 2020-04-13 NOTE — Patient Instructions (Addendum)
Medication Instructions:  TAKE YOUR TORSEMIDE 20MG  TODAY AND TOMORROW THEN SWITCH TO AS NEEDED *If you need a refill on your cardiac medications before your next appointment, please call your pharmacy*    Follow-Up: At [facility], you and your health needs are our priority.  As part of our continuing mission to provide you with exceptional heart care, we have created designated Provider Care Teams.  These Care Teams include your primary Cardiologist (physician) and Advanced Practice Providers (APPs -  Physician Assistants and Nurse Practitioners) who all work together to provide you with the care you need, when you need it.  We recommend signing up for the patient portal called "MyChart".  Sign up information is provided on this After Visit Summary.  MyChart is used to connect with patients for Virtual Visits (Telemedicine).  Patients are able to view lab/test results, encounter notes, upcoming appointments, etc.  Non-urgent messages can be sent to your provider as well.   To learn more about what you can do with MyChart, go to [email or web address].    Your next appointment:   4-6 month(s)  The format for your next appointment:   In Person  Provider:   Shelva Majestic, MD

## 2020-04-13 NOTE — Telephone Encounter (Signed)
Scheduled per 8/31 sch message. Unable to contact pt. Left voicemail with appt time and date.

## 2020-04-13 NOTE — Progress Notes (Signed)
Patient ID: Jo Hendrix, female   DOB: Jan 18, 1927, 86 y.o.   MRN: [id number]    PCP:  Buzzy Han, MD         HPI: Jo Hendrix is a 86 y.o. female who presents to the office for a 4 month follow-up cardiology evaluation.   Jo Hendrix has a long-standing history of hypertension with documented grade 1 diastolic dysfunction.  She also has a history of complex sleep apnea with documented mild pulmonary hypertension and has been on BiPAP. In October 2008 she underwent cardiac catheterization after developing recurrent episodes of chest pain.  This showed mild coronary obstructive disease with 20% narrowing of the proximal LAD with mid LAD systolic bridging and diffuse luminal irregularities of 40-50% in the RCA.  She has had difficulty with shortness of breath and has been diagnosed with COPD/emphysema.  A cardiopulmonary met test  revealed normal functional status with peak oxygen consumption 105%, but she had an abnormal pulmonary response with no ventilation perfusion mismatch and with plus/minus dead space.  She has been intolerant to statin therapy as well as Zetia.  She continues to use BiPAP for her complex sleep apnea.  When I  saw her in follow-up she had noticed progressive increasing shortness of breath with activity and had experienced some very mild chest tightness when she is short of breath.  She also admitted to some mild lower extremity edema particularly involving the left leg by the end of the day.    Aa nuclear stress test on 04/08/2015 was low risk.  Post-rest ejection fraction was 71%.  There was normal perfusion.  An echo Doppler study on 04/07/2015 showed an ejection fraction at 55-60%.  There was grade 1 diastolic dysfunction.  There was mild mitral annular calcification with trace MR and trace TR.  She stopped taking statin therapy.  She had blood work done by [employer] 1 Jerry 15 2018, which I reviewed.  TSH was normal at 0.98.  Vitamin D was 68.8.  Lipid  studies were significantly increased with a total cholesterol 239, LDL 142, non-HDL 168, triglycerides 127, and HDL 71.  She continues to use her BiPAP for previously documented complex sleep apnea..   When seen in September 2018 she denied any episodes of chest pain but was experiencing some shortness of breath with activity, which had not significantly changed.  She underwent a follow-up echo Doppler study on 05/17/2017.  This continued to show normal systolic function with an ejection fraction of 60-65%.  There was grade 1 diastolic dysfunction.  There was moderate aortic insufficiency without stenosis, trivial MR, mild TR, moderate pulmonary regurgitation, and mild elevation of PA peak pressure 33 mm.   She and surgery of her left foot with a neurectomy.  She continues to use BiPAP with 100% compliance and her DME company is [employer].  I was able to obtain a download from August 25 3 05/05/2017.  This showed 100% compliance.  AHI was 3.3.  However, her sleep duration was reduced at only 4 hours and 29 minutes per night.  She's had some issues with sleep maintenance.  She typically goes to bed between 11 and 11:30.  Oftentimes she does not put the mask on when she would return from the bathroom.    She presented to the [facility] emergency room in December 2018 with complaints of dizziness.  She also had had some issues with blood pressure lability.  Her symptoms had started after she had recovered from shingles episode.  She was seen in the office in follow-up by Jo Busing, NP in January 2019 and was feeling improved.  Due to complaints of prior palpitations associated with dizziness she wore a monitor for 1 week which did not reveal any significant abnormality.  She was in sinus rhythm throughout and had isolated unifocal PVCs.  There were no episodes of atrial fibrillation.   I saw her in November 2019 at which time she denied any chest pain, PND orthopnea.  She still experience occasional episodes of  shortness of breath and admitted to being tired and at times having no energy.  She was continuing to use BiPAP therapy and has a full facemask with [employer] as her DME company.   She has been seen by [name], NP since my last evaluation.  She underwent a follow-up echo Doppler study on October 02, 2018 which showed normal systolic function with EF at 60 to 65%.  There was grade 1 diastolic dysfunction.  Atrial size was normal.  She had mild MR, mild TR.  Mild PR.  When  seen in April 2020 she continued she to experience some weakness and some fatigue but also admits to occasional sensation of chest fluttering.  She denies any chest tightness.  She has continued to use BiPAP and admits to 100% compliance.  However, with further questioning she is sleeping for inadequate duration.  She typically goes to bed around 11 PM and oftentimes is up at 430.  She does not routinely sleep 8 hours.  Has been monitoring her blood pressure seems to run in the 130s to 140s but occasionally has increased to 160.  Her pulse typically is in the 70s.   She was admitted to the hospital in October 2020 with worsening shortness of breath and intermittent chest pain.  CT of her chest and x-ray revealed bilateral lung masses consistent with metastatic spread.  CT of her abdomen and pelvis was negative for metastatic disease.  An MRI of the brain was suboptimal due to motion degradation but mild abnormality was noted in the anterior right parietal lobe indicative of possible small acute area of ischemia versus small mets.  A bronchoscopy in October 2020 showed non-small cell cancer.  She is followed by Dr. 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  She developed secondary fluid retention and also was treated with dexamethasone.    I last saw her in May she denies any chest pain or shortness of breath.  She admits to occasional lower extremities ankle  swelling.  She has been off amlodipine for 1 month.  She also had sustained a bleed behind her right eye.  An echo Doppler study on Dec 17, 2019 showed EF 60 to 65% with grade 1 diastolic dysfunction.  She had  a carotid vascular study on Dec 18, 2019 which showed mild bilateral carotid stenoses in the 1 to 39% range.  Incidentally, there was a hypoechoic thyroid nodule with echogenic center visualized at the base of the right neck measuring 1.9 x 1.6 cm and dedicated thyroid ultrasound was suggested.  Over the past 3 to 4 months, she has been followed by Dr. Lorenso Courier of oncology.  She has been treated with palliative chemotherapy with capmatinib 300 mg twice a day and developed progressive leg swelling despite increasing furosemide doses.  She was taken off furosemide and switched to torsemide.  She denies any chest pain.  She continues to use BiPAP with  100% compliance.  She continues to experience leg swelling right greater than left.  She presents for cardiology reevaluation.  Past Medical History:  Diagnosis Date  . ACE-inhibitor cough   . COPD (chronic obstructive pulmonary disease) ([place])   . Diastolic dysfunction   . History of tobacco use    smoked 25 years  . Hypertension   . Mild CAD   . Mild pulmonary hypertension ([place])   . OSA (obstructive sleep apnea)    Sleep Study 04/07/2007 - AHI during total sleep 21.30/hr, during REM 20.16/hr - BiPAP auto servo-ventilation unit    Past Surgical History:  Procedure Laterality Date  . benign lesion removed on lung  2000  . BLADDER REPAIR    . CARDIAC CATHETERIZATION  05/20/2007   mild coronary obstructive disease with 20% narrowing in prox LAD, diffuse luminal irregularity of 40-50% in mid RCA (Dr. Corky Downs)  . Cardiopulmonary Met Test  01/28/2012   with PFTs - FEV1 & FEV1/VC WNL, VC WNL, DLCO WNL; abnormal pulmonary response  . Carotid Doppler  04/2011   normal patency  . CARPAL TUNNEL RELEASE     x2  . CATARACT EXTRACTION, BILATERAL    .  ENDOBRONCHIAL ULTRASOUND N/A 06/12/2019   Procedure: ENDOBRONCHIAL ULTRASOUND;  Surgeon: Candee Furbish, MD;  Location: [facility]COPY;  Service: Endoscopy;  Laterality: N/A;  . FINE NEEDLE ASPIRATION  06/12/2019   Procedure: FINE NEEDLE ASPIRATION;  Surgeon: Candee Furbish, MD;  Location: [facility] END[facility];  Service: Endoscopy;;  . GALLBLADDER SURGERY    . NM MYOCAR PERF WALL MOTION  09/2011   lexiscan myoview - normal perfusion, EF 77%, low risk  . Renal Doppler  05/2007   normal renal arteries   . TONSILLECTOMY    . TRANSTHORACIC ECHOCARDIOGRAM  2013   EF 50-55%; mild MR; mild TR; mild pulm valve regurg; aortic root sclerosis/calcification  . VAGINAL HYSTERECTOMY    . VARICOSE VEIN SURGERY    . VIDEO BRONCHOSCOPY  06/12/2019   Procedure: VIDEO BRONCHOSCOPY;  Surgeon: Candee Furbish, MD;  Location: [facility];  Service: Endoscopy;;    Allergies  Allergen Reactions  . Acetaminophen Anaphylaxis and Other (See Comments)    Difficulty urinating Difficulty urinating   . Benadryl [Diphenhydramine] Other (See Comments)    Tongue gets thick, feel jittery  . Zetia [Ezetimibe] Other (See Comments)    Chest pressure  . Gabapentin     dizziness  . Hydrochlorothiazide Nausea And Vomiting    Dizziness  . Ace Inhibitors Cough  . Codeine Other (See Comments)    difficulty urinating  . Erythromycin Other (See Comments)    DOESN'T REMEMBER   . Etodolac Nausea Only  . Ivp Dye [Iodinated Diagnostic Agents] Hives    Ok to proceed 13 hour prep without benadryl per Radiologist (Dr. Janeece Fitting 12/21/19 hp)     Current Outpatient Medications  Medication Sig Dispense Refill  . aspirin 81 MG tablet Take 81 mg by mouth daily.    . Calcium 1500 MG tablet Take 1,500 mg by mouth daily.    . cholecalciferol (VITAMIN D) 1000 UNITS tablet Take 1,000 Units by mouth daily. Take 1 tab daily    . gabapentin (NEURONTIN) 300 MG capsule Take 1 capsule (300 mg total) by mouth at bedtime. 90 capsule 3  . NON  FORMULARY at bedtime. BiPAP    . Omega-3 Fatty Acids (FISH OIL) 1000 MG CAPS Take 1,000 mg by mouth daily.     . polyvinyl alcohol (LIQUIFILM TEARS) 1.4 %  ophthalmic solution Place 1 drop into both eyes as needed for dry eyes.    . potassium chloride SA (KLOR-CON) 20 MEQ tablet Take 1 tablet (20 mEq total) by mouth daily. 30 tablet 1  . RESTASIS 0.05 % ophthalmic emulsion Place 1 drop into both eyes 2 (two) times daily. Use 1 drop in each eye twice a day    . rosuvastatin (CRESTOR) 5 MG tablet TAKE 1 TABLET EVERY OTHER DAY 45 tablet 2  . torsemide (DEMADEX) 20 MG tablet Take 1 tablet (20 mg total) by mouth as needed. 60 tablet 3   No current facility-administered medications for this visit.    Social History   Socioeconomic History  . Marital status: Widowed    Spouse name: Not on file  . Number of children: 3  . Years of education: Not on file  . Highest education level: Not on file  Occupational History  . Occupation: retired  Tobacco Use  . Smoking status: Former Smoker    Packs/day: 0.50    Years: 25.00    Pack years: 12.50    Types: Cigarettes    Quit date: 08/13/1984    Years since quitting: 35.6  . Smokeless tobacco: Never Used  Vaping Use  . Vaping Use: Never used  Substance and Sexual Activity  . Alcohol use: No  . Drug use: No  . Sexual activity: Not on file  Other Topics Concern  . Not on file  Social History Narrative  . Not on file   Social Determinants of Health   Financial Resource Strain:   . Difficulty of Paying Living Expenses: Not on file  Food Insecurity:   . Worried About Charity fundraiser in the Last Year: Not on file  . Ran Out of Food in the Last Year: Not on file  Transportation Needs:   . Lack of Transportation (Medical): Not on file  . Lack of Transportation (Non-Medical): Not on file  Physical Activity:   . Days of Exercise per Week: Not on file  . Minutes of Exercise per Session: Not on file  Stress:   . Feeling of Stress : Not on  file  Social Connections:   . Frequency of Communication with Friends and Family: Not on file  . Frequency of Social Gatherings with Friends and Family: Not on file  . Attends Religious Services: Not on file  . Active Member of Clubs or Organizations: Not on file  . Attends Archivist Meetings: Not on file  . Marital Status: Not on file  Intimate Partner Violence:   . Fear of Current or Ex-Partner: Not on file  . Emotionally Abused: Not on file  . Physically Abused: Not on file  . Sexually Abused: Not on file    Socially, she is widowed, has 3 children, 8 grandchildren 5 great-grandchildren.  There is a remote tobacco history but she quit many years ago.  Family History  Problem Relation Age of Onset  . Lung cancer Mother   . Suicidality Father   . Cancer Sister   . Breast cancer Daughter   . 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 GI: Negative; No nausea, vomiting, diarrhea, or abdominal pain  GU: Negative; No dysuria, hematuria, or difficulty voiding Musculoskeletal: Negative; no myalgias, joint pain, or weakness Hematologic/Oncology: Negative; no easy bruising, bleeding Endocrine: Negative; no heat/cold intolerance; no diabetes Neuro: Negative; no changes in balance, headaches Skin: Negative; No rashes or skin lesions Psychiatric: Negative; No behavioral problems, depression Sleep: Positive for complex sleep apnea, on therapy; No snoring, daytime sleepiness, hypersomnolence, bruxism, restless legs, hypnogognic hallucinations, no cataplexy Other comprehensive 14 point system review is negative.   PE BP 135/60    Pulse 67   Ht '4\' 11"'  (1.499 m)   Wt 160 lb 3.2 oz (72.7 kg)   SpO2 98%   BMI 32.36 kg/m    Repeat blood pressure by me 132/64  Wt Readings from Last 3 Encounters:  04/13/20 160 lb 3.2 oz (72.7 kg)  04/08/20 152 lb 11.2 oz (69.3 kg)  03/04/20 150 lb 9.6 oz (68.3 kg)   General: Alert, oriented, no distress.  Skin: normal turgor, no rashes, warm and dry HEENT: Normocephalic, atraumatic. Pupils equal round and reactive to light; sclera anicteric; extraocular muscles intact;  Nose without nasal septal hypertrophy Mouth/Parynx benign; Mallinpatti scale 3 Neck: No JVD, no carotid bruits; normal carotid upstroke Lungs: clear to ausculatation and percussion; no wheezing or rales Chest wall: without tenderness to palpitation Heart: PMI not displaced, RRR, s1 s2 normal, 1/6 systolic murmur, no diastolic murmur, no rubs, gallops, thrills, or heaves Abdomen: soft, nontender; no hepatosplenomehaly, BS+; abdominal aorta nontender and not dilated by palpation. Back: no CVA tenderness Pulses 2+ Musculoskeletal: full range of motion, normal strength, no joint deformities Extremities: 1+ edema right lower extremity trace left lower extremity; no clubbing cyanosis, Homan's sign negative  Neurologic: grossly nonfocal; Cranial nerves grossly wnl Psychologic: Normal mood and affect  ECG (independently read by me): NSR at 67; no ST changes, normal intervals  May 2021ECG (independently read by me): Normal sinus rhythm at 68 bpm.  Normal intervals.  Poor R wave progression anteriorly.  April 2020 ECG (independently read by me): Normal sinus rhythm at 86 bpm.  Poor anterior R wave progression V1 through V3.  No ectopy.  Normal intervals.  November 2019 ECG (independently read by me): Normal sinus rhythm at 72 bpm.  Poor anterior R wave progression.  Normal intervals, no ectopy  November 2018 ECG (independently read by me): Normal sinus rhythm at 68 bpm with isolated PVC, nonspecific T changes.  QTc  interval 459 ms.  September 2018 ECG (independently read by me): Normal sinus rhythm at 76 bpm, nonspecific T wave changes.  October 2017 ECG (independently read by me): Normal sinus rhythm at 66 bpm.  Poor R wave progression.  Normal intervals.  October 2016 ECG (independently read by me): Normal sinus rhythm at 66 bpm.  Nonspecific T changes.  September 2015 ECG (independently read by me): Normal sinus rhythm at 62 beats per minute.  Poor progression anteriorly.  Nonspecific ST changes  12/03/2013 ECG (independently read by me): Normal sinus rhythm at 62 beats per minute.  Nonspecific ST changes.  No ectopy  LABS:  BMP Latest Ref Rng & Units 04/08/2020 04/01/2020 03/25/2020  Glucose 70 - 99 mg/dL 106(H) 89 107(H)  BUN 8 - 23 mg/dL 36(H) 45(H) 37(H)  Creatinine 0.44 - 1.00 mg/dL 2.04(H) 2.11(H) 2.07(H)  BUN/Creat Ratio 12 - 28 - - -  Sodium 135 - 145 mmol/L 140 140 139  Potassium 3.5 - 5.1 mmol/L 3.5 3.9 4.2  Chloride 98 - 111 mmol/L 103 103 106  CO2 22 - 32 mmol/L 33(H)  32 28  Calcium 8.9 - 10.3 mg/dL 8.8(L) 9.0 8.2(L)   Hepatic Function Latest Ref Rng & Units 04/08/2020 04/01/2020 03/25/2020  Total Protein 6.5 - 8.1 g/dL 6.0(L) 6.0(L) 5.7(L)  Albumin 3.5 - 5.0 g/dL 2.6(L) 2.6(L) 2.3(L)  AST 15 - 41 U/L 14(L) 12(L) 15  ALT 0 - 44 U/L '12 11 11  ' Alk Phosphatase 38 - 126 U/L 82 81 81  Total Bilirubin 0.3 - 1.2 mg/dL 0.4 0.3 0.3   CBC Latest Ref Rng & Units 04/08/2020 03/18/2020 03/04/2020  WBC 4.0 - 10.5 K/uL 8.9 11.2(H) 11.8(H)  Hemoglobin 12.0 - 15.0 g/dL 12.0 12.8 11.9(L)  Hematocrit 36 - 46 % 37.4 39.0 36.4  Platelets 150 - 400 K/uL 218 288 200   Lab Results  Component Value Date   MCV 93.5 04/08/2020   MCV 91.3 03/18/2020   MCV 94.3 03/04/2020   Lab Results  Component Value Date   TSH 1.011 04/08/2020   No results found for: HGBA1C  Lipid Panel     Component Value Date/Time   CHOL 214 (H) 05/03/2017 1229   TRIG 97 05/03/2017 1229   HDL 63 05/03/2017 1229   CHOLHDL 3.4  05/03/2017 1229   CHOLHDL 2.9 01/16/2017 0817   VLDL 31 (H) 01/16/2017 0817   LDLCALC 132 (H) 05/03/2017 1229    RADIOLOGY: No results found.  IMPRESSION:  1. Essential hypertension   2. Grade I diastolic dysfunction   3. Thyroid nodule   4. Mild CAD   5. CKD (chronic kidney disease), stage IV ([place])   6. Fluid retention with bilateral lower extremity edema   7. Non-small cell cancer of right lung ([place])   8. OSA (obstructive sleep apnea) on BiPAP     ASSESSMENT AND PLAN: Ms. Ahyana Skillin is a very pleasant young appearing 86 year-old female who has a long-standing history of hypertension and has complex sleep apnea on BiPAP therapy.  She has a history of prior statin intolerance and unfortunately was found to have developed stage IIIb adenocarcinoma the right lung, unresectable with evidence for metastases.  She has been undergoing treatment with capmatinib.  She previously had been on amlodipine for hypertension but stopped taking this secondary to progressive lower extremity edema.  Echo Doppler study in May 2021 showed an EF of 60 to 65% without wall motion abnormality and with grade 1 diastolic dysfunction.  She has stage IV chronic kidney disease and creatinine on August 27 had increased to 2.04 giving an estimated creatinine clearance of 21.  He does not have any chest pain and she does not complain of dyspnea.  She is followed by oncology closely and with her age and metastatic disease is on palliative therapy.  She continues to use BiPAP with 100% compliance.  She is unaware of any episodes of tachycardia and denies any presyncope or syncope.  She has a history of statin intolerance.  She underwent ultrasound of her thyroid nodule on January 14, 2020 which showed a stable solitary right nodule which did not meet criteria for biopsy.  It was recommended that she undergo annual or by annual ultrasound until stability x5 years is confirmed.  I will see her in 6 months for follow-up  evaluation.  Troy Sine, MD, [facility]  04/16/2020 4:26 PM

## 2020-04-16 ENCOUNTER — Encounter: Payer: Self-pay | Admitting: Cardiovascular Disease

## 2020-04-21 ENCOUNTER — Telehealth: Payer: Self-pay | Admitting: Cardiovascular Disease

## 2020-04-21 NOTE — Telephone Encounter (Signed)
Pt c/o swelling: STAT is pt has developed SOB within 24 hours  1) How much weight have you gained and in what time span? No noticeable weight gain  2) If swelling, where is the swelling located? Both legs and feet  3) Are you currently taking a fluid pill? Yes, however, patient states it is not working  4) Are you currently SOB? No  5) Do you have a log of your daily weights (if so, list)? No log available  6) Have you gained 3 pounds in a day or 5 pounds in a week? No  7) Have you traveled recently? No

## 2020-04-21 NOTE — Telephone Encounter (Signed)
Spoke with the patient who states that when she saw Dr. Claiborne Billings on 09/01 she was instructed to start taking torsemide 20 mg as needed rather than daily. She states that she has continued to have leg swelling that started as a side effect to chemotherapy. She states that she has continued to take torsemide 20 mg daily. She denies any SOB or weight gain. She would like to know whether she can continue to take torsemide 20 mg daily as she thinks that she still needs it.

## 2020-04-21 NOTE — Telephone Encounter (Signed)
As long as she is still having swelling continue to take torsemide daily

## 2020-04-22 ENCOUNTER — Encounter (HOSPITAL_COMMUNITY): Payer: Self-pay

## 2020-04-22 ENCOUNTER — Encounter: Payer: Self-pay | Admitting: *Deleted

## 2020-04-22 NOTE — Progress Notes (Signed)
I followed up with pathology dept on Jo Hendrix's PDL 1 results.  I was emailed a copy of results.  I updated Dr. Lorenso Courier and his nurse Lynnell Dike.

## 2020-04-22 NOTE — Telephone Encounter (Signed)
Returned call to patient regarding taking her torsemide daily. Advised patient of Dr. Evette Georges recommendation. Which is as follows: "As long as she is still having swelling continue to take torsemide daily".   Educated patient on the need to monitor daily weights. Patient verbalized understanding of all instructions.

## 2020-05-03 ENCOUNTER — Other Ambulatory Visit: Payer: Self-pay | Admitting: Hematology and Oncology

## 2020-05-03 DIAGNOSIS — C3491 Malignant neoplasm of unspecified part of right bronchus or lung: Secondary | ICD-10-CM

## 2020-05-03 NOTE — Progress Notes (Signed)
[facility] [phone]   [phone]  PROGRESS NOTE  Patient Care Team: Buzzy Han, MD as PCP - General (Family Medicine) Troy Sine, MD as PCP - Cardiology (Cardiology)  Hematological/Oncological History HEME/ONC OVERVIEW: 1. Stage IIIB ([id number]) adenocarcinoma of the R lung, unresectable, MET exon 14 splice variant deletion+ -Late 05/2019:  ? A large R lung mass extending from the RUL to RLL with mediastinal invasion, R hilar denopathy on CT; no mets in abdomen ? Questionable small foci in L frontal and R parietal lobes on MRI brain, limited due to motion degradation; repeat MRI still limited by  ? Bronch w/ R lung bx, c/w adenocarcinoma; MET exon 14 splice variant deletion+ ? Large FDG-avid R lung mass with extension to the R hilum and paratracheal LN involvement; no distant mets  -07/2019 - present: palliative capmatinib  ? 12/2019: improving RUL and RLL malignancy and nodal metastasis; no new or metastatic disease   TREATMENT SUMMARY:  07/22/2019 - 8/27/202: capmatinib 370m BID   04/08/2020-present: comfort based care.   Interval History:  ESaleha KalpRhudy 86y.o. female with medical history significant for metastatic adenocarcinoma of the lung who presents for a follow up visit. The patient's last visit was on 04/08/2020. In the interim since the last visit she has had her tissue tested for PD-L1, however there was not a sufficient TMB or TPS score to merit immunotherapy.   On exam today Mrs. Fatica notes he has felt better after discontinuing the Tabrecta.  She notes that the swelling in her arms and legs and starting to go away.  She does continue once daily torsemide.  She has had about 6 pounds of weight loss since her last visit.  She has having worsening fatigue, but no shortness of breath or cough.  Her appetite is good.  A full 10 point ROS is listed below.  The bulk of our discussion today focused on treatment options  moving forward.  Unfortunately she has a low TBS score and is not a candidate for immunotherapy.  The patient voiced her concern that she does not wish to proceed with chemotherapy.  As such she is an appropriate candidate for hospice moving forward.  Currently she is quite functional and minimally symptomatic and therefore we will reassess in 6 weeks to discuss referral to hospice agency.  MEDICAL HISTORY:  Past Medical History:  Diagnosis Date  . ACE-inhibitor cough   . [place]   . Diastolic dysfunction   . History of tobacco use    smoked 25 years  . Hypertension   . Mild CAD   . Mild pulmonary hypertension ([place]   . OSA (obstructive sleep apnea)    Sleep Study 04/07/2007 - AHI during total sleep 21.30/hr, during REM 20.16/hr - BiPAP auto servo-ventilation unit    SURGICAL HISTORY: Past Surgical History:  Procedure Laterality Date  . benign lesion removed on lung  2000  . BLADDER REPAIR    . CARDIAC CATHETERIZATION  05/20/2007   mild coronary obstructive disease with 20% narrowing in prox LAD, diffuse luminal irregularity of 40-50% in mid RCA (Dr. TCorky Downs  . Cardiopulmonary Met Test  01/28/2012   with PFTs - FEV1 & FEV1/VC WNL, VC WNL, DLCO WNL; abnormal pulmonary response  . Carotid Doppler  04/2011   normal patency  . CARPAL TUNNEL RELEASE     x2  . CATARACT EXTRACTION, BILATERAL    . ENDOBRONCHIAL ULTRASOUND N/A 06/12/2019   Procedure:  ENDOBRONCHIAL ULTRASOUND;  Surgeon: Candee Furbish, MD;  Location: [facility];  Service: Endoscopy;  Laterality: N/A;  . FINE NEEDLE ASPIRATION  06/12/2019   Procedure: FINE NEEDLE ASPIRATION;  Surgeon: Candee Furbish, MD;  Location: [facility];  Service: Endoscopy;;  . GALLBLADDER SURGERY    . NM MYOCAR PERF WALL MOTION  09/2011   lexiscan myoview - normal perfusion, EF 77%, low risk  . Renal Doppler  05/2007   normal renal arteries   . TONSILLECTOMY    . TRANSTHORACIC ECHOCARDIOGRAM  2013    EF 50-55%; mild MR; mild TR; mild pulm valve regurg; aortic root sclerosis/calcification  . VAGINAL HYSTERECTOMY    . VARICOSE VEIN SURGERY    . VIDEO BRONCHOSCOPY  06/12/2019   Procedure: VIDEO BRONCHOSCOPY;  Surgeon: Candee Furbish, MD;  Location: [facility];  Service: Endoscopy;;    SOCIAL HISTORY: Social History   Socioeconomic History  . Marital status: Widowed    Spouse name: Not on file  . Number of children: 3  . Years of education: Not on file  . Highest education level: Not on file  Occupational History  . Occupation: retired  Tobacco Use  . Smoking status: Former Smoker    Packs/day: 0.50    Years: 25.00    Pack years: 12.50    Types: Cigarettes    Quit date: 08/13/1984    Years since quitting: 35.7  . Smokeless tobacco: Never Used  Vaping Use  . Vaping Use: Never used  Substance and Sexual Activity  . Alcohol use: No  . Drug use: No  . Sexual activity: Not on file  Other Topics Concern  . Not on file  Social History Narrative  . Not on file   Social Determinants of Health   Financial Resource Strain:   . Difficulty of Paying Living Expenses: Not on file  Food Insecurity:   . Worried About Charity fundraiser in the Last Year: Not on file  . Ran Out of Food in the Last Year: Not on file  Transportation Needs:   . Lack of Transportation (Medical): Not on file  . Lack of Transportation (Non-Medical): Not on file  Physical Activity:   . Days of Exercise per Week: Not on file  . Minutes of Exercise per Session: Not on file  Stress:   . Feeling of Stress : Not on file  Social Connections:   . Frequency of Communication with Friends and Family: Not on file  . Frequency of Social Gatherings with Friends and Family: Not on file  . Attends Religious Services: Not on file  . Active Member of Clubs or Organizations: Not on file  . Attends Archivist Meetings: Not on file  . Marital Status: Not on file  Intimate Partner Violence:   . Fear of  Current or Ex-Partner: Not on file  . Emotionally Abused: Not on file  . Physically Abused: Not on file  . Sexually Abused: Not on file    FAMILY HISTORY: Family History  Problem Relation Age of Onset  . Lung cancer Mother   . Suicidality Father   . Cancer Sister   . Breast cancer Daughter   . Stroke Sister     ALLERGIES:  is allergic to acetaminophen, benadryl [diphenhydramine], zetia [ezetimibe], gabapentin, hydrochlorothiazide, ace inhibitors, codeine, erythromycin, etodolac, and ivp dye [iodinated diagnostic agents].  MEDICATIONS:  Current Outpatient Medications  Medication Sig Dispense Refill  . aspirin 81 MG tablet Take 81 mg by mouth daily.    Marland Kitchen  Calcium 1500 MG tablet Take 1,500 mg by mouth daily.    . cholecalciferol (VITAMIN D) 1000 UNITS tablet Take 1,000 Units by mouth daily. Take 1 tab daily    . gabapentin (NEURONTIN) 300 MG capsule Take 1 capsule (300 mg total) by mouth at bedtime. 90 capsule 3  . NON FORMULARY at bedtime. BiPAP    . Omega-3 Fatty Acids (FISH OIL) 1000 MG CAPS Take 1,000 mg by mouth daily.     . polyvinyl alcohol (LIQUIFILM TEARS) 1.4 % ophthalmic solution Place 1 drop into both eyes as needed for dry eyes.    . potassium chloride SA (KLOR-CON) 20 MEQ tablet Take 1 tablet (20 mEq total) by mouth daily. (Patient not taking: Reported on 05/04/2020) 30 tablet 1  . RESTASIS 0.05 % ophthalmic emulsion Place 1 drop into both eyes 2 (two) times daily. Use 1 drop in each eye twice a day    . rosuvastatin (CRESTOR) 5 MG tablet TAKE 1 TABLET EVERY OTHER DAY 45 tablet 2  . torsemide (DEMADEX) 20 MG tablet Take 1 tablet (20 mg total) by mouth as needed. 60 tablet 3   No current facility-administered medications for this visit.    REVIEW OF SYSTEMS:   Constitutional: ( - ) fevers, ( - )  chills , ( - ) night sweats Eyes: ( - ) blurriness of vision, ( - ) double vision, ( - ) watery eyes Ears, nose, mouth, throat, and face: ( - ) mucositis, ( - ) sore  throat Respiratory: ( - ) cough, ( - ) dyspnea, ( - ) wheezes Cardiovascular: ( - ) palpitation, ( - ) chest discomfort, ( - ) lower extremity swelling Gastrointestinal:  ( - ) nausea, ( - ) heartburn, ( - ) change in bowel habits Skin: ( - ) abnormal skin rashes Lymphatics: ( - ) new lymphadenopathy, ( - ) easy bruising Neurological: ( - ) numbness, ( - ) tingling, ( - ) new weaknesses Behavioral/Psych: ( - ) mood change, ( - ) new changes  All other systems were reviewed with the patient and are negative.  PHYSICAL EXAMINATION: ECOG PERFORMANCE STATUS: 1 - Symptomatic but completely ambulatory  Vitals:   05/04/20 1058  BP: 137/64  Pulse: 68  Resp: 18  Temp: 98.5 F (36.9 C)  SpO2: 96%   Filed Weights   05/04/20 1058  Weight: 146 lb 11.2 oz (66.5 kg)    GENERAL: well appearing elderly Caucasian female (appears considerably younger than state age) alert, no distress and comfortable SKIN: skin color, texture, turgor are normal, no rashes or significant lesions EYES: conjunctiva are pink and non-injected, sclera clearl LUNGS: clear to auscultation and percussion with normal breathing effort HEART: regular rate & rhythm and no murmurs and + 2 pitting edema in LE bilaterally. +1 in UE.  Musculoskeletal: no cyanosis of digits and no clubbing  PSYCH: alert & oriented x 3, fluent speech NEURO: no focal motor/sensory deficits  LABORATORY DATA:  I have reviewed the data as listed CBC Latest Ref Rng & Units 05/04/2020 04/08/2020 03/18/2020  WBC 4.0 - 10.5 K/uL 10.4 8.9 11.2(H)  Hemoglobin 12.0 - 15.0 g/dL 12.8 12.0 12.8  Hematocrit 36 - 46 % 38.1 37.4 39.0  Platelets 150 - 400 K/uL 295 218 288    CMP Latest Ref Rng & Units 05/04/2020 04/08/2020 04/01/2020  Glucose 70 - 99 mg/dL 87 106(H) 89  BUN 8 - 23 mg/dL 26(H) 36(H) 45(H)  Creatinine 0.44 - 1.00 mg/dL 1.37(H) 2.04(H) 2.11(H)  Sodium 135 - 145 mmol/L 139 140 140  Potassium 3.5 - 5.1 mmol/L 3.6 3.5 3.9  Chloride 98 - 111 mmol/L  100 103 103  CO2 22 - 32 mmol/L 36(H) 33(H) 32  Calcium 8.9 - 10.3 mg/dL 9.6 8.8(L) 9.0  Total Protein 6.5 - 8.1 g/dL 7.1 6.0(L) 6.0(L)  Total Bilirubin 0.3 - 1.2 mg/dL 0.5 0.4 0.3  Alkaline Phos 38 - 126 U/L 66 82 81  AST 15 - 41 U/L 16 14(L) 12(L)  ALT 0 - 44 U/L _0 RADIOGRAPHIC STUDIES: I have personally reviewed the imaging and agree with the radiologists assessment: interval progression of the lung mass with on other clear sites of disease.   No results found.  ASSESSMENT & PLAN Cyrena Kuchenbecker Kassin 86 y.o. female with medical history significant for metastatic adenocarcinoma of the lung who presents for a follow up visit.  After review the labs, review the imaging, discussion with the patient her findings are most consistent with interval progression of disease while on capmatinib.  As such we discontinued this therapy and sent her tissue for PD-L1 status to determine if further treatment with immunotherapy would be viable. Unfortunately she has markedly low levels of PD-L1 and is not a candidate for immunotherapy.   On exam today Mrs. Tsuda is beginning to lose some weight but is otherwise not having any issues with nausea, vomiting, or diarrhea.  She is not having any issues with pain or shortness of breath at this time.  The bulk of our discussion focused on treatment options moving forward.  Unfortunately her TPS score is too low for consideration of immunotherapy and therefore the only option available would be chemotherapy.  The patient notes that she does not wish to proceed with chemotherapy and I am in full agreement with this decision.  As such I would recommend comfort based care only at this time.  Fortunately at this time she is minimally symptomatic and therefore does not require referral to hospice at this moment.  I would recommend that she follow-up in our clinic in approximately 6 weeks time for reevaluation and monitoring of her symptoms.  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 This, coupled with the patient's poor tolerance is a good indication to HOLD further capmatinib therapy.  - her TPS score is too low for immunotherapy; she is not a good candidate for pembrolizumab monotherapy.  - PD-L1 status is not high enough to merit treatment with pembrolizumab. At this time I would recommend comfort based care/hospice.  ? Of note, patient has a history of contrast allergy, and requires premedication with Benadryl and prednisone -Periodic TSH monitoring   #Fluid retention, improving -Secondary to capmatinib, which has been discontinued -Overall improved from prior -Given the AKI, will monitor closely. AKI is improving.   #Leukocytosis, resolved -Secondary to steroid -subsequently normalized -We will monitor it closely   #AKI -Likely due to mild volume depletion and use of diuretic therapy as above -Cr 1.37 today, improved from prior -will monitoring carefully while patient is on torsemide  No orders of the defined types were placed in this encounter.   All questions were answered. The patient knows to call the clinic with any problems, questions or concerns.  A total of more than 30 minutes were spent on this encounter and over half of that time was spent on counseling and coordination of  care as outlined above.   Ledell Peoples, MD Department of Hematology/Oncology [place] [facility] Phone: [phone] Pager: [phone] Email: [email or web address]  05/08/2020 12:51 PM

## 2020-05-04 ENCOUNTER — Inpatient Hospital Stay: Payer: Medicare HMO | Attending: Hematology | Admitting: Hematology and Oncology

## 2020-05-04 ENCOUNTER — Inpatient Hospital Stay: Payer: Medicare HMO

## 2020-05-04 ENCOUNTER — Other Ambulatory Visit: Payer: Self-pay

## 2020-05-04 VITALS — BP 137/64 | HR 68 | Temp 98.5°F | Resp 18 | Ht 59.0 in | Wt 146.7 lb

## 2020-05-04 DIAGNOSIS — Z803 Family history of malignant neoplasm of breast: Secondary | ICD-10-CM | POA: Diagnosis not present

## 2020-05-04 DIAGNOSIS — G4733 Obstructive sleep apnea (adult) (pediatric): Secondary | ICD-10-CM | POA: Insufficient documentation

## 2020-05-04 DIAGNOSIS — C3490 Malignant neoplasm of unspecified part of unspecified bronchus or lung: Secondary | ICD-10-CM

## 2020-05-04 DIAGNOSIS — C3481 Malignant neoplasm of overlapping sites of right bronchus and lung: Secondary | ICD-10-CM | POA: Diagnosis present

## 2020-05-04 DIAGNOSIS — I2510 Atherosclerotic heart disease of native coronary artery without angina pectoris: Secondary | ICD-10-CM | POA: Diagnosis not present

## 2020-05-04 DIAGNOSIS — Z87891 Personal history of nicotine dependence: Secondary | ICD-10-CM | POA: Diagnosis not present

## 2020-05-04 DIAGNOSIS — Z801 Family history of malignant neoplasm of trachea, bronchus and lung: Secondary | ICD-10-CM | POA: Diagnosis not present

## 2020-05-04 DIAGNOSIS — J449 Chronic obstructive pulmonary disease, unspecified: Secondary | ICD-10-CM | POA: Insufficient documentation

## 2020-05-04 DIAGNOSIS — Z79899 Other long term (current) drug therapy: Secondary | ICD-10-CM | POA: Diagnosis not present

## 2020-05-04 DIAGNOSIS — R609 Edema, unspecified: Secondary | ICD-10-CM | POA: Diagnosis not present

## 2020-05-04 DIAGNOSIS — C3491 Malignant neoplasm of unspecified part of right bronchus or lung: Secondary | ICD-10-CM | POA: Diagnosis not present

## 2020-05-04 DIAGNOSIS — Z7982 Long term (current) use of aspirin: Secondary | ICD-10-CM | POA: Diagnosis not present

## 2020-05-04 DIAGNOSIS — I10 Essential (primary) hypertension: Secondary | ICD-10-CM | POA: Diagnosis not present

## 2020-05-04 DIAGNOSIS — N179 Acute kidney failure, unspecified: Secondary | ICD-10-CM

## 2020-05-04 LAB — LACTATE DEHYDROGENASE: LDH: 188 U/L (ref 98–192)

## 2020-05-04 LAB — CBC WITH DIFFERENTIAL (CANCER CENTER ONLY)
Abs Immature Granulocytes: 0.07 10*3/uL (ref 0.00–0.07)
Basophils Absolute: 0 10*3/uL (ref 0.0–0.1)
Basophils Relative: 0 %
Eosinophils Absolute: 0.2 10*3/uL (ref 0.0–0.5)
Eosinophils Relative: 2 %
HCT: 38.1 % (ref 36.0–46.0)
Hemoglobin: 12.8 g/dL (ref 12.0–15.0)
Immature Granulocytes: 1 %
Lymphocytes Relative: 23 %
Lymphs Abs: 2.4 10*3/uL (ref 0.7–4.0)
MCH: 31.1 pg (ref 26.0–34.0)
MCHC: 33.6 g/dL (ref 30.0–36.0)
MCV: 92.7 fL (ref 80.0–100.0)
Monocytes Absolute: 0.7 10*3/uL (ref 0.1–1.0)
Monocytes Relative: 7 %
Neutro Abs: 7 10*3/uL (ref 1.7–7.7)
Neutrophils Relative %: 67 %
Platelet Count: 295 10*3/uL (ref 150–400)
RBC: 4.11 MIL/uL (ref 3.87–5.11)
RDW: 13.7 % (ref 11.5–15.5)
WBC Count: 10.4 10*3/uL (ref 4.0–10.5)
nRBC: 0 % (ref 0.0–0.2)

## 2020-05-04 LAB — CMP (CANCER CENTER ONLY)
ALT: 12 U/L (ref 0–44)
AST: 16 U/L (ref 15–41)
Albumin: 3.2 g/dL — ABNORMAL LOW (ref 3.5–5.0)
Alkaline Phosphatase: 66 U/L (ref 38–126)
Anion gap: 3 — ABNORMAL LOW (ref 5–15)
BUN: 26 mg/dL — ABNORMAL HIGH (ref 8–23)
CO2: 36 mmol/L — ABNORMAL HIGH (ref 22–32)
Calcium: 9.6 mg/dL (ref 8.9–10.3)
Chloride: 100 mmol/L (ref 98–111)
Creatinine: 1.37 mg/dL — ABNORMAL HIGH (ref 0.44–1.00)
GFR, Est AFR Am: 39 mL/min — ABNORMAL LOW (ref >60)
GFR, Estimated: 34 mL/min — ABNORMAL LOW (ref >60)
Glucose, Bld: 87 mg/dL (ref 70–99)
Potassium: 3.6 mmol/L (ref 3.5–5.1)
Sodium: 139 mmol/L (ref 135–145)
Total Bilirubin: 0.5 mg/dL (ref 0.3–1.2)
Total Protein: 7.1 g/dL (ref 6.5–8.1)

## 2020-05-04 LAB — TSH: TSH: 0.646 u[IU]/mL (ref 0.308–3.960)

## 2020-05-09 ENCOUNTER — Telehealth: Payer: Self-pay | Admitting: Hematology and Oncology

## 2020-05-09 NOTE — Telephone Encounter (Signed)
Scheduled apt per 9/26 sch msg - pt aware of appt date and time

## 2020-05-13 ENCOUNTER — Other Ambulatory Visit: Payer: Self-pay

## 2020-05-13 MED ORDER — TORSEMIDE 20 MG PO TABS
20.0000 mg | ORAL_TABLET | ORAL | 11 refills | Status: DC | PRN
Start: 2020-05-13 — End: 2020-05-17

## 2020-05-17 ENCOUNTER — Other Ambulatory Visit: Payer: Self-pay | Admitting: Cardiovascular Disease

## 2020-05-17 MED ORDER — TORSEMIDE 20 MG PO TABS: 20.0000 mg | ORAL_TABLET | ORAL | 2 refills | Status: AC | PRN

## 2020-05-17 NOTE — Telephone Encounter (Signed)
*  STAT* If patient is at the pharmacy, call can be transferred to refill team.   1. Which medications need to be refilled? (please list name of each medication and dose if known) torsemide (DEMADEX) 20 MG tablet  2. Which pharmacy/location (including street and city if local pharmacy) is medication to be sent to? [place], [place]  3. Do they need a 30 day or 90 day supply? 90   Patient states that [facility] called her and stated that they received the refill request but the number of supply was not listed. Please advise.

## 2020-05-18 ENCOUNTER — Encounter (INDEPENDENT_AMBULATORY_CARE_PROVIDER_SITE_OTHER): Payer: Self-pay | Admitting: Ophthalmology

## 2020-05-18 ENCOUNTER — Ambulatory Visit (INDEPENDENT_AMBULATORY_CARE_PROVIDER_SITE_OTHER): Payer: Medicare HMO | Admitting: Ophthalmology

## 2020-05-18 ENCOUNTER — Other Ambulatory Visit: Payer: Self-pay

## 2020-05-18 ENCOUNTER — Ambulatory Visit (INDEPENDENT_AMBULATORY_CARE_PROVIDER_SITE_OTHER): Payer: Medicare HMO

## 2020-05-18 ENCOUNTER — Encounter (INDEPENDENT_AMBULATORY_CARE_PROVIDER_SITE_OTHER): Payer: Self-pay

## 2020-05-18 DIAGNOSIS — H4311 Vitreous hemorrhage, right eye: Secondary | ICD-10-CM

## 2020-05-18 DIAGNOSIS — G4731 Primary central sleep apnea: Secondary | ICD-10-CM

## 2020-05-18 DIAGNOSIS — H348111 Central retinal vein occlusion, right eye, with retinal neovascularization: Secondary | ICD-10-CM | POA: Insufficient documentation

## 2020-05-18 MED ORDER — PREDNISOLONE ACETATE 1 % OP SUSP: 1.0000 [drp] | Freq: Four times a day (QID) | OPHTHALMIC | 0 refills | Status: DC

## 2020-05-18 MED ORDER — OFLOXACIN 0.3 % OP SOLN: 1.0000 [drp] | Freq: Four times a day (QID) | OPHTHALMIC | 0 refills | Status: AC

## 2020-05-18 NOTE — Patient Instructions (Signed)
Back the office promptly for new difficulties should they arise.

## 2020-05-18 NOTE — Assessment & Plan Note (Signed)
The nature of the vitreous hemorrhage was discussed with the patient as well as the common causes.   Patients with diabetic eye disease may develop retinal neovascularization.  Other eye conditions develop retinal  neovascularization secondary to retinal venous occlusions.   Vitreous hemorrhage may result from spontaneous vitreous detachment or retinal breaks.  Blunt trauma is a common cause as well.  The need for serial evaluation of the fundus (interior of the eye) until  clear views are obtained was addressed. An occasional need  to monitor the condition by in office  B-scan ultrasonography in the case of dense vitreous hemorrhage was discussed.  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  I explained to the patient that blood vessels growing from the central retinal vein occlusion Wheeze going into [place].  Throughout blossoms which be analogous to the vitreous hemorrhage blocking her view in vision, yet the weeds growing unchecked is what can make the eye become permanently blind.  Therefore I explained that the patient should undergo vitrectomy with endolaser panretinal photocoagulation of the right eye to maximize visual potential although the vision will ultimately be limited by the previous vein occlusion which triggered swelling in the center of the vision.  She is currently 24 days status post injection intravitreal Eylea which should control her condition for up to 8 weeks for neovascular growth.  Somewhere between 2 to 3 weeks to undergo vitrectomy in the right eye which would be similar to cataract surgery, mainly under local anesthesia with monitored anesthesia control.  In my hands this typically takes 15 to 18 minutes of surgical time.  And that surgery she would then have observation as to what the underlying possibilities for central visual acuity would be.

## 2020-05-18 NOTE — Progress Notes (Signed)
05/18/2020     CHIEF COMPLAINT Patient presents for Retina Evaluation   HISTORY OF PRESENT ILLNESS: Jo Hendrix is a 86 y.o. female who presents to the clinic today for:   HPI    Retina Evaluation    In right eye.  This started 3 months ago.  Duration of months.  Associated Symptoms Blind Spot.  Context:  distance vision, mid-range vision and near vision.  Treatments tried include injection.  Response to treatment was no improvement.          Comments    NP Second Opinion CRVO with ME and dense VH OD  Pt reports significant blur OD x "several months." No ocular pain OU. Pt reports stable VA OS. Pt has hx of anti-VEGF injections OD with last injection of Eylea on 04/04/2020 with Dr. Jule Ser.       Last edited by Rockie Neighbours, [place] on 05/18/2020  8:47 AM. (History)      Referring physician: Buzzy Han, MD [place],  [place] [address]  HISTORICAL INFORMATION:   Selected notes from the MEDICAL RECORD NUMBER       CURRENT MEDICATIONS: Current Outpatient Medications (Ophthalmic Drugs)  Medication Sig  . polyvinyl alcohol (LIQUIFILM TEARS) 1.4 % ophthalmic solution Place 1 drop into both eyes as needed for dry eyes.  . RESTASIS 0.05 % ophthalmic emulsion Place 1 drop into both eyes 2 (two) times daily. Use 1 drop in each eye twice a day   No current facility-administered medications for this visit. (Ophthalmic Drugs)   Current Outpatient Medications (Other)  Medication Sig  . aspirin 81 MG tablet Take 81 mg by mouth daily.  . Calcium 1500 MG tablet Take 1,500 mg by mouth daily.  . cholecalciferol (VITAMIN D) 1000 UNITS tablet Take 1,000 Units by mouth daily. Take 1 tab daily  . gabapentin (NEURONTIN) 300 MG capsule Take 1 capsule (300 mg total) by mouth at bedtime.  . NON FORMULARY at bedtime. BiPAP  . Omega-3 Fatty Acids (FISH OIL) 1000 MG CAPS Take 1,000 mg by mouth daily.   . potassium chloride SA (KLOR-CON) 20 MEQ tablet Take 1 tablet  (20 mEq total) by mouth daily. (Patient not taking: Reported on 05/04/2020)  . rosuvastatin (CRESTOR) 5 MG tablet TAKE 1 TABLET EVERY OTHER DAY  . torsemide (DEMADEX) 20 MG tablet Take 1 tablet (20 mg total) by mouth as needed.   No current facility-administered medications for this visit. (Other)      REVIEW OF SYSTEMS:    ALLERGIES Allergies  Allergen Reactions  . Acetaminophen Anaphylaxis and Other (See Comments)    Difficulty urinating Difficulty urinating   . Benadryl [Diphenhydramine] Other (See Comments)    Tongue gets thick, feel jittery  . Zetia [Ezetimibe] Other (See Comments)    Chest pressure  . Gabapentin     dizziness  . Hydrochlorothiazide Nausea And Vomiting    Dizziness  . Ace Inhibitors Cough  . Codeine Other (See Comments)    difficulty urinating  . Erythromycin Other (See Comments)    DOESN'T REMEMBER   . Etodolac Nausea Only  . Ivp Dye [Iodinated Diagnostic Agents] Hives    Ok to proceed 13 hour prep without benadryl per Radiologist (Dr. Janeece Fitting 12/21/19 hp)     PAST MEDICAL HISTORY Past Medical History:  Diagnosis Date  . ACE-inhibitor cough   . COPD (chronic obstructive pulmonary disease) ([place])   . Diastolic dysfunction   . History of tobacco use    smoked 25  years  . Hypertension   . Mild CAD   . Mild pulmonary hypertension ([place])   . OSA (obstructive sleep apnea)    Sleep Study 04/07/2007 - AHI during total sleep 21.30/hr, during REM 20.16/hr - BiPAP auto servo-ventilation unit   Past Surgical History:  Procedure Laterality Date  . benign lesion removed on lung  2000  . BLADDER REPAIR    . CARDIAC CATHETERIZATION  05/20/2007   mild coronary obstructive disease with 20% narrowing in prox LAD, diffuse luminal irregularity of 40-50% in mid RCA (Dr. Corky Downs)  . Cardiopulmonary Met Test  01/28/2012   with PFTs - FEV1 & FEV1/VC WNL, VC WNL, DLCO WNL; abnormal pulmonary response  . Carotid Doppler  04/2011   normal patency  . CARPAL TUNNEL  RELEASE     x2  . CATARACT EXTRACTION, BILATERAL    . ENDOBRONCHIAL ULTRASOUND N/A 06/12/2019   Procedure: ENDOBRONCHIAL ULTRASOUND;  Surgeon: Candee Furbish, MD;  Location: [facility];  Service: Endoscopy;  Laterality: N/A;  . FINE NEEDLE ASPIRATION  06/12/2019   Procedure: FINE NEEDLE ASPIRATION;  Surgeon: Candee Furbish, MD;  Location: [facility];  Service: Endoscopy;;  . GALLBLADDER SURGERY    . NM MYOCAR PERF WALL MOTION  09/2011   lexiscan myoview - normal perfusion, EF 77%, low risk  . Renal Doppler  05/2007   normal renal arteries   . TONSILLECTOMY    . TRANSTHORACIC ECHOCARDIOGRAM  2013   EF 50-55%; mild MR; mild TR; mild pulm valve regurg; aortic root sclerosis/calcification  . VAGINAL HYSTERECTOMY    . VARICOSE VEIN SURGERY    . VIDEO BRONCHOSCOPY  06/12/2019   Procedure: VIDEO BRONCHOSCOPY;  Surgeon: Candee Furbish, MD;  Location: [facility];  Service: Endoscopy;;    FAMILY HISTORY Family History  Problem Relation Age of Onset  . Lung cancer Mother   . Suicidality Father   . Cancer Sister   . Breast cancer Daughter   . Stroke Sister     SOCIAL HISTORY Social History   Tobacco Use  . Smoking status: Former Smoker    Packs/day: 0.50    Years: 25.00    Pack years: 12.50    Types: Cigarettes    Quit date: 08/13/1984    Years since quitting: 35.7  . Smokeless tobacco: Never Used  Vaping Use  . Vaping Use: Never used  Substance Use Topics  . Alcohol use: No  . Drug use: No         OPHTHALMIC EXAM:  Base Eye Exam    Visual Acuity (ETDRS)      Right Left   Dist cc CF @ face 20/20   Dist ph cc NI    Correction: Glasses       Tonometry (Tonopen, 8:47 AM)      Right Left   Pressure 09 10       Pupils      Dark Light Shape React APD   Right 3 2 Round Brisk +1   Left 3 2 Irregular Brisk None       Visual Fields (Counting fingers)      Left Right    Full    Restrictions  Total superior temporal, superior nasal, inferior nasal  deficiencies       Extraocular Movement      Right Left    Full Full       Neuro/Psych    Oriented x3: Yes   Mood/Affect: Normal  Dilation    Both eyes: 1.0% Mydriacyl, 2.5% Phenylephrine @ 8:51 AM        Slit Lamp and Fundus Exam    External Exam      Right Left   External Normal Normal       Slit Lamp Exam      Right Left   Lids/Lashes Normal Normal   Conjunctiva/Sclera White and quiet White and quiet   Cornea Clear Clear   Anterior Chamber Deep and quiet Deep and quiet   Iris Round and reactive, , no neovascularization Round and reactive   Lens Centered posterior chamber intraocular lens Centered posterior chamber intraocular lens   Anterior Vitreous Normal Normal       Fundus Exam      Right Left   Posterior Vitreous Vitreous hemorrhage 4+ Normal   Disc No view posteriorly Normal   C/D Ratio  0.35   Macula  Normal   Vessels  Normal   Periphery Appears attached, glimpses nasally Normal          IMAGING AND PROCEDURES  Imaging and Procedures for 05/18/20  OCT, Retina - OU - Both Eyes       Left Eye Quality was good. Scan locations included subfoveal. Central Foveal Thickness: 249. Progression has no prior data. Findings include normal foveal contour.   Notes OD with no views posteriorly through dense media opacity       Color Fundus Photography Optos - OU - Both Eyes       Right Eye Progression has no prior data.   Left Eye Progression has no prior data. Disc findings include normal observations. Macula : normal observations. Vessels : normal observations. Periphery : normal observations.   Notes OD, dense vitreous hemorrhage of the right eye obscuring macula, vessels, the periphery.  Glimpses nasally retina detached  OS normal with clear media, optic nerve may have an old retinal vein collateral vessel.                ASSESSMENT/PLAN:  Vitreous hemorrhage of right eye ([place]) The nature of the vitreous hemorrhage was  discussed with the patient as well as the common causes.   Patients with diabetic eye disease may develop retinal neovascularization.  Other eye conditions develop retinal  neovascularization secondary to retinal venous occlusions.   Vitreous hemorrhage may result from spontaneous vitreous detachment or retinal breaks.  Blunt trauma is a common cause as well.  The need for serial evaluation of the fundus (interior of the eye) until  clear views are obtained was addressed. An occasional need  to monitor the condition by in office  B-scan ultrasonography in the case of dense vitreous hemorrhage was discussed.  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  I explained to the patient that blood vessels growing from the central retinal vein occlusion Whee[place] going into [place].  Throughout blossoms which be analogous to the vitreous hemorrhage blocking her view in vision, yet the weeds growing unchecked is what can make the eye become permanently blind.  Therefore I explained that the patient should undergo vitrectomy with endolaser panretinal photocoagulation of the right eye to maximize visual potential although the vision will ultimately be limited by the previous vein occlusion which triggered swelling in the center of the vision.  She is currently 24 days status post injection intravitreal Eylea which should control her condition for up to 8 weeks for neovascular growth.  Somewhere between 2  to 3 weeks to undergo vitrectomy in the right eye which would be similar to cataract surgery, mainly under local anesthesia with monitored anesthesia control.  In my hands this typically takes 15 to 18 minutes of surgical time.  And that surgery she would then have observation as to what the underlying possibilities for central visual acuity would be.  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  At a minimum she should have repeat injection antivegF in 2 to 3 weeks and thereafter would continue to have the choice of undergoing vitrectomy right eye to maximize her visual potential and delineate the underlying cause and his ongoing response to antivegF      ICD-10-CM   1. Vitreous hemorrhage of right eye (HCC)  H43.11 OCT, Retina - OU - Both Eyes    Color Fundus Photography Optos - OU - Both Eyes  2. 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 Complex sleep apnea syndrome  G47.31     1.  Patient understands and indicates her understanding of the nature of the underlying condition as well as secondary vitreous hemorrhage with her son present.  2.  I asked if there are any other other questions.  3.  Is to consider where and how she is going to proceed with her next steps as I outlined what I would propose if she continues her care in this practice  4.  After further consideration patient would like to proceed with surgical intervention.  We will attempt to provide this in the next 2 weeks Ophthalmic Meds Ordered this visit:  No orders of the defined types were placed in this encounter.      Return ,,, for We will schedule vitrectomy, endolaser PRP local [facility], OD.  Patient Instructions  Back the office promptly for new difficulties should they arise.    Explained the diagnoses, plan, and follow up with the patient and they expressed understanding.  Patient expressed understanding of the importance of proper follow up care.   Clent Demark Morley Gaumer M.D. Diseases & Surgery of the Retina and  Vitreous Retina & Diabetic [place] 05/18/20     Abbreviations: M myopia (nearsighted); A astigmatism; H hyperopia (farsighted); P presbyopia; Mrx spectacle prescription;  CTL contact lenses; OD right eye; OS left eye; OU both eyes  XT exotropia; ET esotropia; PEK punctate epithelial keratitis; PEE punctate epithelial erosions; DES dry eye syndrome; MGD meibomian gland dysfunction; ATs artificial tears; PFAT's preservative free artificial tears; Arnold nuclear sclerotic cataract; PSC posterior subcapsular cataract; ERM epi-retinal membrane; PVD posterior vitreous detachment; RD retinal detachment; DM diabetes mellitus; DR diabetic retinopathy; NPDR non-proliferative diabetic retinopathy; PDR proliferative diabetic retinopathy; CSME clinically significant macular edema; DME diabetic macular edema; dbh dot blot hemorrhages; CWS cotton wool spot; POAG primary open angle glaucoma; C/D cup-to-disc ratio; HVF humphrey visual field; GVF goldmann visual field; OCT optical coherence tomography; IOP intraocular pressure; BRVO Branch retinal vein occlusion; CRVO central retinal vein occlusion; CRAO central retinal artery occlusion; BRAO branch retinal artery occlusion; RT retinal tear; SB scleral buckle; PPV pars plana vitrectomy; VH Vitreous hemorrhage; PRP panretinal laser photocoagulation; IVK  intravitreal kenalog; VMT vitreomacular traction; MH Macular hole;  NVD neovascularization of the disc; NVE neovascularization elsewhere; AREDS age related eye disease study; ARMD age related macular degeneration; POAG primary open angle glaucoma; EBMD epithelial/anterior basement membrane dystrophy; ACIOL anterior chamber intraocular lens; IOL intraocular lens; PCIOL posterior chamber intraocular lens; Phaco/IOL phacoemulsification with intraocular lens placement; J[place]ine photorefractive keratectomy; LASIK laser assisted in situ keratomileusis; HTN hypertension; DM diabetes mellitus; COPD chronic obstructive pulmonary disease

## 2020-05-18 NOTE — Progress Notes (Signed)
05/18/2020     CHIEF COMPLAINT Patient presents for No chief complaint on file.   HISTORY OF PRESENT ILLNESS: Jo Hendrix is a 86 y.o. female who presents to the clinic today for:   HPI    Pre Op OD Vitrectomy and Endolaser for Vit Hem OD   Last edited by Tilda Franco on 05/18/2020  1:46 PM. (History)        HISTORICAL INFORMATION:   Selected notes from the MEDICAL RECORD NUMBER       CURRENT MEDICATIONS: Current Outpatient Medications (Ophthalmic Drugs)  Medication Sig  . polyvinyl alcohol (LIQUIFILM TEARS) 1.4 % ophthalmic solution Place 1 drop into both eyes as needed for dry eyes.  . RESTASIS 0.05 % ophthalmic emulsion Place 1 drop into both eyes 2 (two) times daily. Use 1 drop in each eye twice a day   No current facility-administered medications for this visit. (Ophthalmic Drugs)   Current Outpatient Medications (Other)  Medication Sig  . aspirin 81 MG tablet Take 81 mg by mouth daily.  . Calcium 1500 MG tablet Take 1,500 mg by mouth daily.  . cholecalciferol (VITAMIN D) 1000 UNITS tablet Take 1,000 Units by mouth daily. Take 1 tab daily  . gabapentin (NEURONTIN) 300 MG capsule Take 1 capsule (300 mg total) by mouth at bedtime.  . NON FORMULARY at bedtime. BiPAP  . Omega-3 Fatty Acids (FISH OIL) 1000 MG CAPS Take 1,000 mg by mouth daily.   . potassium chloride SA (KLOR-CON) 20 MEQ tablet Take 1 tablet (20 mEq total) by mouth daily. (Patient not taking: Reported on 05/04/2020)  . rosuvastatin (CRESTOR) 5 MG tablet TAKE 1 TABLET EVERY OTHER DAY  . torsemide (DEMADEX) 20 MG tablet Take 1 tablet (20 mg total) by mouth as needed.   No current facility-administered medications for this visit. (Other)     ALLERGIES Allergies  Allergen Reactions  . Acetaminophen Anaphylaxis and Other (See Comments)    Difficulty urinating Difficulty urinating   . Benadryl [Diphenhydramine] Other (See Comments)    Tongue gets thick, feel jittery  . Zetia [Ezetimibe] Other  (See Comments)    Chest pressure  . Gabapentin     dizziness  . Hydrochlorothiazide Nausea And Vomiting    Dizziness  . Ace Inhibitors Cough  . Codeine Other (See Comments)    difficulty urinating  . Erythromycin Other (See Comments)    DOESN'T REMEMBER   . Etodolac Nausea Only  . Ivp Dye [Iodinated Diagnostic Agents] Hives    Ok to proceed 13 hour prep without benadryl per Radiologist (Dr. Janeece Fitting 12/21/19 hp)     PAST MEDICAL HISTORY Past Medical History:  Diagnosis Date  . ACE-inhibitor cough   . COPD (chronic obstructive pulmonary disease) ([place])   . Diastolic dysfunction   . History of tobacco use    smoked 25 years  . Hypertension   . Mild CAD   . Mild pulmonary hypertension ([place])   . OSA (obstructive sleep apnea)    Sleep Study 04/07/2007 - AHI during total sleep 21.30/hr, during REM 20.16/hr - BiPAP auto servo-ventilation unit   Past Surgical History:  Procedure Laterality Date  . benign lesion removed on lung  2000  . BLADDER REPAIR    . CARDIAC CATHETERIZATION  05/20/2007   mild coronary obstructive disease with 20% narrowing in prox LAD, diffuse luminal irregularity of 40-50% in mid RCA (Dr. Corky Downs)  . Cardiopulmonary Met Test  01/28/2012   with PFTs - FEV1 & FEV1/VC WNL,  VC WNL, DLCO WNL; abnormal pulmonary response  . Carotid Doppler  04/2011   normal patency  . CARPAL TUNNEL RELEASE     x2  . CATARACT EXTRACTION, BILATERAL    . ENDOBRONCHIAL ULTRASOUND N/A 06/12/2019   Procedure: ENDOBRONCHIAL ULTRASOUND;  Surgeon: Candee Furbish, MD;  Location: [facility];  Service: Endoscopy;  Laterality: N/A;  . FINE NEEDLE ASPIRATION  06/12/2019   Procedure: FINE NEEDLE ASPIRATION;  Surgeon: Candee Furbish, MD;  Location: [facility];  Service: Endoscopy;;  . GALLBLADDER SURGERY    . NM MYOCAR PERF WALL MOTION  09/2011   lexiscan myoview - normal perfusion, EF 77%, low risk  . Renal Doppler  05/2007   normal renal arteries   . TONSILLECTOMY    . TRANSTHORACIC  ECHOCARDIOGRAM  2013   EF 50-55%; mild MR; mild TR; mild pulm valve regurg; aortic root sclerosis/calcification  . VAGINAL HYSTERECTOMY    . VARICOSE VEIN SURGERY    . VIDEO BRONCHOSCOPY  06/12/2019   Procedure: VIDEO BRONCHOSCOPY;  Surgeon: Candee Furbish, MD;  Location: [facility];  Service: Endoscopy;;    FAMILY HISTORY Family History  Problem Relation Age of Onset  . Lung cancer Mother   . Suicidality Father   . Cancer Sister   . Breast cancer Daughter   . Stroke Sister     SOCIAL HISTORY Social History   Tobacco Use  . Smoking status: Former Smoker    Packs/day: 0.50    Years: 25.00    Pack years: 12.50    Types: Cigarettes    Quit date: 08/13/1984    Years since quitting: 35.7  . Smokeless tobacco: Never Used  Vaping Use  . Vaping Use: Never used  Substance Use Topics  . Alcohol use: No  . Drug use: No         OPHTHALMIC EXAM: Not recorded     IMAGING AND PROCEDURES  Imaging and Procedures for _0 @  OCT, Retina - OU - Both Eyes       Left Eye Quality was good. Scan locations included subfoveal. Central Foveal Thickness: 249. Progression has no prior data. Findings include normal foveal contour.   Notes OD with no views posteriorly through dense media opacity       Color Fundus Photography Optos - OU - Both Eyes       Right Eye Progression has no prior data.   Left Eye Progression has no prior data. Disc findings include normal observations. Macula : normal observations. Vessels : normal observations. Periphery : normal observations.   Notes OD, dense vitreous hemorrhage of the right eye obscuring macula, vessels, the periphery.  Glimpses nasally retina detached  OS normal with clear media, optic nerve may have an old retinal vein collateral vessel.                ASSESSMENT/PLAN:  No diagnosis found.  Ophthalmic Meds Ordered this visit:  No orders of the defined types were placed in this encounter.       Pre-op  completed. Operative consent obtained with pre-op eye drops reviewed with Alexia Freestone and sent via [facility] as needed. Post op instructions reviewed with patient and per patient all questions answered.  Tilda Franco

## 2020-05-18 NOTE — Assessment & Plan Note (Signed)
Patient reminded the critical importance of continuing CPAP therapy and noticing needed to maximize oxygenation throughout the nighttime to match the daytime breathing capabilities

## 2020-05-18 NOTE — Assessment & Plan Note (Signed)
Now some 6-week status post injection intravitreal Eylea OD.  At a minimum she should have repeat injection antivegF in 2 to 3 weeks and thereafter would continue to have the choice of undergoing vitrectomy right eye to maximize her visual potential and delineate the underlying cause and his ongoing response to antivegF

## 2020-05-26 ENCOUNTER — Other Ambulatory Visit: Payer: Self-pay | Admitting: Cardiovascular Disease

## 2020-05-26 NOTE — Telephone Encounter (Signed)
*  STAT* If patient is at the pharmacy, call can be transferred to refill team.   1. Which medications need to be refilled? (please list name of each medication and dose if known) torsemide (DEMADEX) 20 MG tablet  2. Which pharmacy/location (including street and city if local pharmacy) is medication to be sent to? [place], [place] - [address]  3. Do they need a 30 day or 90 day supply? 30   Ordered on 10/05 to [facility] but pt still has not received medication and she is completely out.

## 2020-06-02 ENCOUNTER — Encounter (INDEPENDENT_AMBULATORY_CARE_PROVIDER_SITE_OTHER): Payer: Medicare HMO | Admitting: Ophthalmology

## 2020-06-02 DIAGNOSIS — H4311 Vitreous hemorrhage, right eye: Secondary | ICD-10-CM | POA: Diagnosis not present

## 2020-06-02 DIAGNOSIS — H348111 Central retinal vein occlusion, right eye, with retinal neovascularization: Secondary | ICD-10-CM

## 2020-06-03 ENCOUNTER — Ambulatory Visit (INDEPENDENT_AMBULATORY_CARE_PROVIDER_SITE_OTHER): Payer: Medicare HMO | Admitting: Ophthalmology

## 2020-06-03 DIAGNOSIS — H348111 Central retinal vein occlusion, right eye, with retinal neovascularization: Secondary | ICD-10-CM

## 2020-06-03 DIAGNOSIS — H4311 Vitreous hemorrhage, right eye: Secondary | ICD-10-CM

## 2020-06-03 NOTE — Patient Instructions (Addendum)
Patient to resume topical medications using  1.  Pred forte 1% 1 drop right eye 4 times daily  2.  Ofloxacin 1 drop right eye 4 times daily  Patient instructed to never mash or rub or compress the eye.  Patient instructed to do no intentional exercise heavy lifting bending straining for the next 10 days.  No lifting and bending for 1 week. No water in the eye for 10 days. Do not rub the eye. Wear shield at night for 1-3 days.  Wear your CPAP as normal, if instructed by your doctor.  Continue your topical medications for a total of 3 weeks.  Do not refill your postoperative medications unless instructed.

## 2020-06-03 NOTE — Progress Notes (Signed)
06/03/2020     CHIEF COMPLAINT Patient presents for Post-op Follow-up   HISTORY OF PRESENT ILLNESS: Jo Hendrix is a 86 y.o. female who presents to the clinic today for:   HPI    Post-op Follow-up    In right eye.  Vision is improved.  I, the attending physician,  performed the HPI with the patient and updated documentation appropriately.          Comments    Postop day #1 status post vitrectomy, PRP for Dense nonclearing vitreous hemorrhage, and central retinal vein occlusion with neovascularization       Last edited by Hurman Horn, MD on 06/03/2020  8:07 AM. (History)      Referring physician: No referring provider defined for this encounter.  HISTORICAL INFORMATION:   Selected notes from the MEDICAL RECORD NUMBER       CURRENT MEDICATIONS: Current Outpatient Medications (Ophthalmic Drugs)  Medication Sig  . polyvinyl alcohol (LIQUIFILM TEARS) 1.4 % ophthalmic solution Place 1 drop into both eyes as needed for dry eyes.  . prednisoLONE acetate (PRED FORTE) 1 % ophthalmic suspension Place 1 drop into the right eye 4 (four) times daily.  . RESTASIS 0.05 % ophthalmic emulsion Place 1 drop into both eyes 2 (two) times daily. Use 1 drop in each eye twice a day   No current facility-administered medications for this visit. (Ophthalmic Drugs)   Current Outpatient Medications (Other)  Medication Sig  . aspirin 81 MG tablet Take 81 mg by mouth daily.  . Calcium 1500 MG tablet Take 1,500 mg by mouth daily.  . cholecalciferol (VITAMIN D) 1000 UNITS tablet Take 1,000 Units by mouth daily. Take 1 tab daily  . gabapentin (NEURONTIN) 300 MG capsule Take 1 capsule (300 mg total) by mouth at bedtime.  . NON FORMULARY at bedtime. BiPAP  . Omega-3 Fatty Acids (FISH OIL) 1000 MG CAPS Take 1,000 mg by mouth daily.   . potassium chloride SA (KLOR-CON) 20 MEQ tablet Take 1 tablet (20 mEq total) by mouth daily. (Patient not taking: Reported on 05/04/2020)  . rosuvastatin (CRESTOR)  5 MG tablet TAKE 1 TABLET EVERY OTHER DAY  . torsemide (DEMADEX) 20 MG tablet Take 1 tablet (20 mg total) by mouth as needed.   No current facility-administered medications for this visit. (Other)      REVIEW OF SYSTEMS:    ALLERGIES Allergies  Allergen Reactions  . Acetaminophen Anaphylaxis and Other (See Comments)    Difficulty urinating Difficulty urinating   . Benadryl [Diphenhydramine] Other (See Comments)    Tongue gets thick, feel jittery  . Zetia [Ezetimibe] Other (See Comments)    Chest pressure  . Gabapentin     dizziness  . Hydrochlorothiazide Nausea And Vomiting    Dizziness  . Ace Inhibitors Cough  . Codeine Other (See Comments)    difficulty urinating  . Erythromycin Other (See Comments)    DOESN'T REMEMBER   . Etodolac Nausea Only  . Ivp Dye [Iodinated Diagnostic Agents] Hives    Ok to proceed 13 hour prep without benadryl per Radiologist (Dr. Janeece Fitting 12/21/19 hp)     PAST MEDICAL HISTORY Past Medical History:  Diagnosis Date  . ACE-inhibitor cough   . COPD (chronic obstructive pulmonary disease) ([place])   . Diastolic dysfunction   . History of tobacco use    smoked 25 years  . Hypertension   . Mild CAD   . Mild pulmonary hypertension ([place])   . OSA (obstructive sleep apnea)  Sleep Study 04/07/2007 - AHI during total sleep 21.30/hr, during REM 20.16/hr - BiPAP auto servo-ventilation unit   Past Surgical History:  Procedure Laterality Date  . benign lesion removed on lung  2000  . BLADDER REPAIR    . CARDIAC CATHETERIZATION  05/20/2007   mild coronary obstructive disease with 20% narrowing in prox LAD, diffuse luminal irregularity of 40-50% in mid RCA (Dr. Corky Downs)  . Cardiopulmonary Met Test  01/28/2012   with PFTs - FEV1 & FEV1/VC WNL, VC WNL, DLCO WNL; abnormal pulmonary response  . Carotid Doppler  04/2011   normal patency  . CARPAL TUNNEL RELEASE     x2  . CATARACT EXTRACTION, BILATERAL    . ENDOBRONCHIAL ULTRASOUND N/A 06/12/2019    Procedure: ENDOBRONCHIAL ULTRASOUND;  Surgeon: Candee Furbish, MD;  Location: [facility];  Service: Endoscopy;  Laterality: N/A;  . FINE NEEDLE ASPIRATION  06/12/2019   Procedure: FINE NEEDLE ASPIRATION;  Surgeon: Candee Furbish, MD;  Location: [facility];  Service: Endoscopy;;  . GALLBLADDER SURGERY    . NM MYOCAR PERF WALL MOTION  09/2011   lexiscan myoview - normal perfusion, EF 77%, low risk  . Renal Doppler  05/2007   normal renal arteries   . TONSILLECTOMY    . TRANSTHORACIC ECHOCARDIOGRAM  2013   EF 50-55%; mild MR; mild TR; mild pulm valve regurg; aortic root sclerosis/calcification  . VAGINAL HYSTERECTOMY    . VARICOSE VEIN SURGERY    . VIDEO BRONCHOSCOPY  06/12/2019   Procedure: VIDEO BRONCHOSCOPY;  Surgeon: Candee Furbish, MD;  Location: [facility];  Service: Endoscopy;;    FAMILY HISTORY Family History  Problem Relation Age of Onset  . Lung cancer Mother   . Suicidality Father   . Cancer Sister   . Breast cancer Daughter   . Stroke Sister     SOCIAL HISTORY Social History   Tobacco Use  . Smoking status: Former Smoker    Packs/day: 0.50    Years: 25.00    Pack years: 12.50    Types: Cigarettes    Quit date: 08/13/1984    Years since quitting: 35.8  . Smokeless tobacco: Never Used  Vaping Use  . Vaping Use: Never used  Substance Use Topics  . Alcohol use: No  . Drug use: No         OPHTHALMIC EXAM:  Slit Lamp and Fundus Exam    External Exam      Right Left   External Mild periocular ecchymosis        Slit Lamp Exam      Right Left   Lids/Lashes Trace edema.    Conjunctiva/Sclera 2+ Injection    Cornea Clear    Anterior Chamber Deep and quiet    Iris Round and reactive    Lens Centered posterior chamber intraocular lens    Anterior Vitreous Clear, vitrectomized, rare RBC        Fundus Exam      Right Left   Posterior Vitreous Clear vitrectomized, rare RBC    C/D Ratio 0.4    Macula Macula attached    Vessels Old central retinal  vein occlusion, with thready poor peripheral vascularization    Periphery Attached, good PRP 360           IMAGING AND PROCEDURES  Imaging and Procedures for 06/03/20           ASSESSMENT/PLAN:  Vitreous hemorrhage of right eye ([place]) Postop day #1, visual acuity much improved "I can  see"  Central retinal vein occlusion with neovascularization of right eye Good PRP, status post vitrectomy PRP 1 day, condition less likely to continue to stabilize      ICD-10-CM   1. Vitreous hemorrhage of right eye (HCC)  H43.11   2. Central retinal vein occlusion with neovascularization of right eye  H34.8111     1.  OD, vastly improved 1 day postop vitrectomy PRP for dense nonclearing vitreous hemorrhage.  Central retinal vein occlusion with prior neovascularization confirmed at the time of vitrectomy.  2.  Patient instructed to use nighttime hard patch to protect the eye from compression.  3.  Patient instructed to call when the office is open on Monday, to make an appointment for Wednesday or Thursday next week  Ophthalmic Meds Ordered this visit:  No orders of the defined types were placed in this encounter.      Return in about 5 days (around 06/08/2020) for dilate, OD, COLOR FP, POST OP.  Patient Instructions  Patient to resume topical medications using  1.  Pred forte 1% 1 drop right eye 4 times daily  2.  Ofloxacin 1 drop right eye 4 times daily  Patient instructed to never mash or rub or compress the eye.  Patient instructed to do no intentional exercise heavy lifting bending straining for the next 10 days.  No lifting and bending for 1 week. No water in the eye for 10 days. Do not rub the eye. Wear shield at night for 1-3 days.  Wear your CPAP as normal, if instructed by your doctor.  Continue your topical medications for a total of 3 weeks.  Do not refill your postoperative medications unless instructed.    Explained the diagnoses, plan, and follow up with the  patient and they expressed understanding.  Patient expressed understanding of the importance of proper follow up care.   Clent Demark Aryn Safran M.D. Diseases & Surgery of the Retina and Vitreous Retina & Diabetic [place] 06/03/20     Abbreviations: M myopia (nearsighted); A astigmatism; H hyperopia (farsighted); P presbyopia; Mrx spectacle prescription;  CTL contact lenses; OD right eye; OS left eye; OU both eyes  XT exotropia; ET esotropia; PEK punctate epithelial keratitis; PEE punctate epithelial erosions; DES dry eye syndrome; MGD meibomian gland dysfunction; ATs artificial tears; PFAT's preservative free artificial tears; [place] nuclear sclerotic cataract; PSC posterior subcapsular cataract; ERM epi-retinal membrane; PVD posterior vitreous detachment; RD retinal detachment; DM diabetes mellitus; DR diabetic retinopathy; NPDR non-proliferative diabetic retinopathy; PDR proliferative diabetic retinopathy; CSME clinically significant macular edema; DME diabetic macular edema; dbh dot blot hemorrhages; CWS cotton wool spot; POAG primary open angle glaucoma; C/D cup-to-disc ratio; HVF humphrey visual field; GVF goldmann visual field; OCT optical coherence tomography; IOP intraocular pressure; BRVO Branch retinal vein occlusion; CRVO central retinal vein occlusion; CRAO central retinal artery occlusion; BRAO branch retinal artery occlusion; RT retinal tear; SB scleral buckle; PPV pars plana vitrectomy; VH Vitreous hemorrhage; PRP panretinal laser photocoagulation; IVK intravitreal kenalog; VMT vitreomacular traction; MH Macular hole;  NVD neovascularization of the disc; NVE neovascularization elsewhere; AREDS age related eye disease study; ARMD age related macular degeneration; POAG primary open angle glaucoma; EBMD epithelial/anterior basement membrane dystrophy; ACIOL anterior chamber intraocular lens; IOL intraocular lens; PCIOL posterior chamber intraocular lens; Phaco/IOL phacoemulsification with intraocular  lens placement; [place] photorefractive keratectomy; LASIK laser assisted in situ keratomileusis; HTN hypertension; DM diabetes mellitus; COPD chronic obstructive pulmonary disease

## 2020-06-03 NOTE — Assessment & Plan Note (Signed)
Postop day #1, visual acuity much improved "I can see"

## 2020-06-03 NOTE — Assessment & Plan Note (Signed)
Good PRP, status post vitrectomy PRP 1 day, condition less likely to continue to stabilize

## 2020-06-09 ENCOUNTER — Other Ambulatory Visit: Payer: Self-pay

## 2020-06-09 ENCOUNTER — Ambulatory Visit (INDEPENDENT_AMBULATORY_CARE_PROVIDER_SITE_OTHER): Payer: Medicare HMO | Admitting: Ophthalmology

## 2020-06-09 ENCOUNTER — Encounter (INDEPENDENT_AMBULATORY_CARE_PROVIDER_SITE_OTHER): Payer: Medicare HMO | Admitting: Ophthalmology

## 2020-06-09 ENCOUNTER — Encounter (INDEPENDENT_AMBULATORY_CARE_PROVIDER_SITE_OTHER): Payer: Self-pay | Admitting: Ophthalmology

## 2020-06-09 DIAGNOSIS — H4311 Vitreous hemorrhage, right eye: Secondary | ICD-10-CM

## 2020-06-09 DIAGNOSIS — Z09 Encounter for follow-up examination after completed treatment for conditions other than malignant neoplasm: Secondary | ICD-10-CM

## 2020-06-09 NOTE — Assessment & Plan Note (Addendum)
Resolved status post vitrectomy 06-02-20

## 2020-06-09 NOTE — Assessment & Plan Note (Signed)
Posterior vitrectomy, panretinal photocoagulation OD-06-01-20

## 2020-06-09 NOTE — Progress Notes (Signed)
06/09/2020     CHIEF COMPLAINT Patient presents for Post-op Follow-up   HISTORY OF PRESENT ILLNESS: Jo Hendrix is a 86 y.o. female who presents to the clinic today for:   HPI    Post-op Follow-up    In right eye.  Discomfort includes Negative for pain.  Vision is stable.  I, the attending physician,  performed the HPI with the patient and updated documentation appropriately.          Comments    1 Week s\p Vitrectomy OD  Pt states OD is doing well. Using gtts as directed.       Last edited by Tilda Franco on 06/09/2020  1:15 PM. (History)      Referring physician: Buzzy Han, MD [place],  [place] [address]  HISTORICAL INFORMATION:   Selected notes from the MEDICAL RECORD NUMBER       CURRENT MEDICATIONS: Current Outpatient Medications (Ophthalmic Drugs)  Medication Sig  . polyvinyl alcohol (LIQUIFILM TEARS) 1.4 % ophthalmic solution Place 1 drop into both eyes as needed for dry eyes.  . prednisoLONE acetate (PRED FORTE) 1 % ophthalmic suspension Place 1 drop into the right eye 4 (four) times daily.  . RESTASIS 0.05 % ophthalmic emulsion Place 1 drop into both eyes 2 (two) times daily. Use 1 drop in each eye twice a day   No current facility-administered medications for this visit. (Ophthalmic Drugs)   Current Outpatient Medications (Other)  Medication Sig  . aspirin 81 MG tablet Take 81 mg by mouth daily.  . Calcium 1500 MG tablet Take 1,500 mg by mouth daily.  . cholecalciferol (VITAMIN D) 1000 UNITS tablet Take 1,000 Units by mouth daily. Take 1 tab daily  . gabapentin (NEURONTIN) 300 MG capsule Take 1 capsule (300 mg total) by mouth at bedtime.  . NON FORMULARY at bedtime. BiPAP  . Omega-3 Fatty Acids (FISH OIL) 1000 MG CAPS Take 1,000 mg by mouth daily.   . potassium chloride SA (KLOR-CON) 20 MEQ tablet Take 1 tablet (20 mEq total) by mouth daily. (Patient not taking: Reported on 05/04/2020)  . rosuvastatin (CRESTOR) 5  MG tablet TAKE 1 TABLET EVERY OTHER DAY  . torsemide (DEMADEX) 20 MG tablet Take 1 tablet (20 mg total) by mouth as needed.   No current facility-administered medications for this visit. (Other)      REVIEW OF SYSTEMS:    ALLERGIES Allergies  Allergen Reactions  . Acetaminophen Anaphylaxis and Other (See Comments)    Difficulty urinating Difficulty urinating   . Benadryl [Diphenhydramine] Other (See Comments)    Tongue gets thick, feel jittery  . Zetia [Ezetimibe] Other (See Comments)    Chest pressure  . Gabapentin     dizziness  . Hydrochlorothiazide Nausea And Vomiting    Dizziness  . Ace Inhibitors Cough  . Codeine Other (See Comments)    difficulty urinating  . Erythromycin Other (See Comments)    DOESN'T REMEMBER   . Etodolac Nausea Only  . Ivp Dye [Iodinated Diagnostic Agents] Hives    Ok to proceed 13 hour prep without benadryl per Radiologist (Dr. Janeece Fitting 12/21/19 hp)     PAST MEDICAL HISTORY Past Medical History:  Diagnosis Date  . ACE-inhibitor cough   . COPD (chronic obstructive pulmonary disease) ([place])   . Diastolic dysfunction   . History of tobacco use    smoked 25 years  . Hypertension   . Mild CAD   . Mild pulmonary hypertension ([place])   .  OSA (obstructive sleep apnea)    Sleep Study 04/07/2007 - AHI during total sleep 21.30/hr, during REM 20.16/hr - BiPAP auto servo-ventilation unit   Past Surgical History:  Procedure Laterality Date  . benign lesion removed on lung  2000  . BLADDER REPAIR    . CARDIAC CATHETERIZATION  05/20/2007   mild coronary obstructive disease with 20% narrowing in prox LAD, diffuse luminal irregularity of 40-50% in mid RCA (Dr. Corky Downs)  . Cardiopulmonary Met Test  01/28/2012   with PFTs - FEV1 & FEV1/VC WNL, VC WNL, DLCO WNL; abnormal pulmonary response  . Carotid Doppler  04/2011   normal patency  . CARPAL TUNNEL RELEASE     x2  . CATARACT EXTRACTION, BILATERAL    . ENDOBRONCHIAL ULTRASOUND N/A 06/12/2019    Procedure: ENDOBRONCHIAL ULTRASOUND;  Surgeon: Candee Furbish, MD;  Location: [facility];  Service: Endoscopy;  Laterality: N/A;  . FINE NEEDLE ASPIRATION  06/12/2019   Procedure: FINE NEEDLE ASPIRATION;  Surgeon: Candee Furbish, MD;  Location: [facility];  Service: Endoscopy;;  . GALLBLADDER SURGERY    . NM MYOCAR PERF WALL MOTION  09/2011   lexiscan myoview - normal perfusion, EF 77%, low risk  . Renal Doppler  05/2007   normal renal arteries   . TONSILLECTOMY    . TRANSTHORACIC ECHOCARDIOGRAM  2013   EF 50-55%; mild MR; mild TR; mild pulm valve regurg; aortic root sclerosis/calcification  . VAGINAL HYSTERECTOMY    . VARICOSE VEIN SURGERY    . VIDEO BRONCHOSCOPY  06/12/2019   Procedure: VIDEO BRONCHOSCOPY;  Surgeon: Candee Furbish, MD;  Location: [facility];  Service: Endoscopy;;    FAMILY HISTORY Family History  Problem Relation Age of Onset  . Lung cancer Mother   . Suicidality Father   . Cancer Sister   . Breast cancer Daughter   . Stroke Sister     SOCIAL HISTORY Social History   Tobacco Use  . Smoking status: Former Smoker    Packs/day: 0.50    Years: 25.00    Pack years: 12.50    Types: Cigarettes    Quit date: 08/13/1984    Years since quitting: 35.8  . Smokeless tobacco: Never Used  Vaping Use  . Vaping Use: Never used  Substance Use Topics  . Alcohol use: No  . Drug use: No         OPHTHALMIC EXAM:  Base Eye Exam    Visual Acuity (Snellen - Linear)      Right Left   Dist cc E Card @ 6' 20/25   Dist ph cc 20/400    Correction: Glasses       Tonometry (Tonopen, 1:16 PM)      Right Left   Pressure 6 11       Pupils      Pupils Dark Light Shape React APD   Right PERRL 4 4 Round Minimal +1   Left PERRL 4 3 Round Brisk None       Visual Fields (Counting fingers)      Left Right    Full Full       Neuro/Psych    Oriented x3: Yes   Mood/Affect: Normal       Dilation    Right eye: 1.0% Mydriacyl, 2.5% Phenylephrine @ 1:16 PM          Slit Lamp and Fundus Exam    External Exam      Right Left   External Mild periocular ecchymosis  Slit Lamp Exam      Right Left   Lids/Lashes Trace edema.    Conjunctiva/Sclera 2+ Injection    Cornea Clear    Anterior Chamber Deep and quiet    Iris Round and reactive    Lens Centered posterior chamber intraocular lens    Anterior Vitreous Clear, vitrectomized, rare RBC        Fundus Exam      Right Left   Posterior Vitreous Clear vitrectomized, rare RBC    Disc Premacular pigmentary deposits, no change from time of surgery    C/D Ratio 0.4    Macula Macula attached    Vessels Old central retinal vein occlusion, with thready poor peripheral vascularization    Periphery Attached, good PRP 360           IMAGING AND PROCEDURES  Imaging and Procedures for 06/09/20  Color Fundus Photography Optos - OU - Both Eyes       Right Eye Progression has improved. Disc findings include pallor.   Notes Diffuse intraretinal hemorrhage inferiorly and inferotemporally from prior old central retinal vein occlusion partially compensated.  Good PRP for regions of previous neovascularization which triggered the prior vitreous hemorrhage  OS with old collateralization on the optic nerve suggesting prior old hemicentral retinal vein occlusion superiorly, now Compensated                ASSESSMENT/PLAN:  Postoperative follow-up Posterior vitrectomy, panretinal photocoagulation OD-06-01-20  Vitreous hemorrhage of right eye ([place]) Resolved status post vitrectomy 06-02-20      ICD-10-CM   1. Vitreous hemorrhage of right eye (HCC)  H43.11 Color Fundus Photography Optos - OU - Both Eyes  2. Postoperative follow-up  Z09     1.  Patient to complete topical medications over the next 2 weeks,   Pred forte 1 drop right eye 4 times daily  Ofloxacin 1 drop right eye 4 times daily  2.  Patient to contact the office promptly for new onset visual acuity decline or  distortions  3.  Patient may return to full activity in 4 days  Ophthalmic Meds Ordered this visit:  No orders of the defined types were placed in this encounter.      Return in about 8 weeks (around 08/04/2020) for dilate, OD, COLOR FP, OCT.  There are no Patient Instructions on file for this visit.   Explained the diagnoses, plan, and follow up with the patient and they expressed understanding.  Patient expressed understanding of the importance of proper follow up care.   Clent Demark Chanya Chrisley M.D. Diseases & Surgery of the Retina and Vitreous Retina & Diabetic [place] 06/09/20     Abbreviations: M myopia (nearsighted); A astigmatism; H hyperopia (farsighted); P presbyopia; Mrx spectacle prescription;  CTL contact lenses; OD right eye; OS left eye; OU both eyes  XT exotropia; ET esotropia; PEK punctate epithelial keratitis; PEE punctate epithelial erosions; DES dry eye syndrome; MGD meibomian gland dysfunction; ATs artificial tears; PFAT's preservative free artificial tears; [place] nuclear sclerotic cataract; PSC posterior subcapsular cataract; ERM epi-retinal membrane; PVD posterior vitreous detachment; RD retinal detachment; DM diabetes mellitus; DR diabetic retinopathy; NPDR non-proliferative diabetic retinopathy; PDR proliferative diabetic retinopathy; CSME clinically significant macular edema; DME diabetic macular edema; dbh dot blot hemorrhages; CWS cotton wool spot; POAG primary open angle glaucoma; C/D cup-to-disc ratio; HVF humphrey visual field; GVF goldmann visual field; OCT optical coherence tomography; IOP intraocular pressure; BRVO Branch retinal vein occlusion; CRVO central retinal vein occlusion; CRAO central retinal artery  occlusion; BRAO branch retinal artery occlusion; RT retinal tear; SB scleral buckle; PPV pars plana vitrectomy; VH Vitreous hemorrhage; PRP panretinal laser photocoagulation; IVK intravitreal kenalog; VMT vitreomacular traction; MH Macular hole;  NVD  neovascularization of the disc; NVE neovascularization elsewhere; AREDS age related eye disease study; ARMD age related macular degeneration; POAG primary open angle glaucoma; EBMD epithelial/anterior basement membrane dystrophy; ACIOL anterior chamber intraocular lens; IOL intraocular lens; PCIOL posterior chamber intraocular lens; Phaco/IOL phacoemulsification with intraocular lens placement; [place] photorefractive keratectomy; LASIK laser assisted in situ keratomileusis; HTN hypertension; DM diabetes mellitus; COPD chronic obstructive pulmonary disease

## 2020-06-22 ENCOUNTER — Inpatient Hospital Stay: Payer: Medicare HMO

## 2020-06-22 ENCOUNTER — Other Ambulatory Visit: Payer: Self-pay | Admitting: Hematology and Oncology

## 2020-06-22 ENCOUNTER — Other Ambulatory Visit: Payer: Self-pay

## 2020-06-22 ENCOUNTER — Inpatient Hospital Stay: Payer: Medicare HMO | Attending: Hematology | Admitting: Hematology and Oncology

## 2020-06-22 VITALS — BP 139/61 | HR 76 | Temp 98.0°F | Resp 18 | Ht 59.0 in | Wt 150.3 lb

## 2020-06-22 DIAGNOSIS — C3491 Malignant neoplasm of unspecified part of right bronchus or lung: Secondary | ICD-10-CM

## 2020-06-22 DIAGNOSIS — T380X5A Adverse effect of glucocorticoids and synthetic analogues, initial encounter: Secondary | ICD-10-CM | POA: Insufficient documentation

## 2020-06-22 DIAGNOSIS — R600 Localized edema: Secondary | ICD-10-CM | POA: Diagnosis not present

## 2020-06-22 DIAGNOSIS — D72829 Elevated white blood cell count, unspecified: Secondary | ICD-10-CM | POA: Diagnosis not present

## 2020-06-22 DIAGNOSIS — C3481 Malignant neoplasm of overlapping sites of right bronchus and lung: Secondary | ICD-10-CM | POA: Insufficient documentation

## 2020-06-22 DIAGNOSIS — J449 Chronic obstructive pulmonary disease, unspecified: Secondary | ICD-10-CM | POA: Insufficient documentation

## 2020-06-22 DIAGNOSIS — N179 Acute kidney failure, unspecified: Secondary | ICD-10-CM | POA: Diagnosis not present

## 2020-06-22 DIAGNOSIS — Z79899 Other long term (current) drug therapy: Secondary | ICD-10-CM | POA: Insufficient documentation

## 2020-06-22 DIAGNOSIS — I272 Pulmonary hypertension, unspecified: Secondary | ICD-10-CM | POA: Diagnosis not present

## 2020-06-22 DIAGNOSIS — I10 Essential (primary) hypertension: Secondary | ICD-10-CM | POA: Insufficient documentation

## 2020-06-22 DIAGNOSIS — G4733 Obstructive sleep apnea (adult) (pediatric): Secondary | ICD-10-CM | POA: Diagnosis not present

## 2020-06-22 DIAGNOSIS — I2510 Atherosclerotic heart disease of native coronary artery without angina pectoris: Secondary | ICD-10-CM | POA: Diagnosis not present

## 2020-06-22 DIAGNOSIS — Z801 Family history of malignant neoplasm of trachea, bronchus and lung: Secondary | ICD-10-CM | POA: Insufficient documentation

## 2020-06-22 DIAGNOSIS — R609 Edema, unspecified: Secondary | ICD-10-CM | POA: Insufficient documentation

## 2020-06-22 DIAGNOSIS — Z803 Family history of malignant neoplasm of breast: Secondary | ICD-10-CM | POA: Diagnosis not present

## 2020-06-22 DIAGNOSIS — Z7982 Long term (current) use of aspirin: Secondary | ICD-10-CM | POA: Diagnosis not present

## 2020-06-22 DIAGNOSIS — Z87891 Personal history of nicotine dependence: Secondary | ICD-10-CM | POA: Diagnosis not present

## 2020-06-22 LAB — CMP (CANCER CENTER ONLY)
ALT: 9 U/L (ref 0–44)
AST: 16 U/L (ref 15–41)
Albumin: 3.3 g/dL — ABNORMAL LOW (ref 3.5–5.0)
Alkaline Phosphatase: 78 U/L (ref 38–126)
Anion gap: 10 (ref 5–15)
BUN: 23 mg/dL (ref 8–23)
CO2: 30 mmol/L (ref 22–32)
Calcium: 8.5 mg/dL — ABNORMAL LOW (ref 8.9–10.3)
Chloride: 100 mmol/L (ref 98–111)
Creatinine: 1.47 mg/dL — ABNORMAL HIGH (ref 0.44–1.00)
GFR, Estimated: 33 mL/min — ABNORMAL LOW (ref >60)
Glucose, Bld: 81 mg/dL (ref 70–99)
Potassium: 3.6 mmol/L (ref 3.5–5.1)
Sodium: 140 mmol/L (ref 135–145)
Total Bilirubin: 0.4 mg/dL (ref 0.3–1.2)
Total Protein: 7.2 g/dL (ref 6.5–8.1)

## 2020-06-22 LAB — CBC WITH DIFFERENTIAL (CANCER CENTER ONLY)
Abs Immature Granulocytes: 0.04 10*3/uL (ref 0.00–0.07)
Basophils Absolute: 0 10*3/uL (ref 0.0–0.1)
Basophils Relative: 0 %
Eosinophils Absolute: 0.2 10*3/uL (ref 0.0–0.5)
Eosinophils Relative: 2 %
HCT: 37.8 % (ref 36.0–46.0)
Hemoglobin: 12.6 g/dL (ref 12.0–15.0)
Immature Granulocytes: 1 %
Lymphocytes Relative: 23 %
Lymphs Abs: 2 10*3/uL (ref 0.7–4.0)
MCH: 30.8 pg (ref 26.0–34.0)
MCHC: 33.3 g/dL (ref 30.0–36.0)
MCV: 92.4 fL (ref 80.0–100.0)
Monocytes Absolute: 0.7 10*3/uL (ref 0.1–1.0)
Monocytes Relative: 8 %
Neutro Abs: 5.5 10*3/uL (ref 1.7–7.7)
Neutrophils Relative %: 66 %
Platelet Count: 257 10*3/uL (ref 150–400)
RBC: 4.09 MIL/uL (ref 3.87–5.11)
RDW: 13 % (ref 11.5–15.5)
WBC Count: 8.3 10*3/uL (ref 4.0–10.5)
nRBC: 0 % (ref 0.0–0.2)

## 2020-06-22 NOTE — Progress Notes (Signed)
[place] [phone]   [phone]  PROGRESS NOTE  Patient Care Team: Buzzy Han, MD as PCP - General (Family Medicine) Troy Sine, MD as PCP - Cardiology (Cardiology)  Hematological/Oncological History HEME/ONC OVERVIEW: 1. Stage IIIB ([id number]) adenocarcinoma of the R lung, unresectable, MET exon 14 splice variant deletion+ -Late 05/2019:  ? A large R lung mass extending from the RUL to RLL with mediastinal invasion, R hilar denopathy on CT; no mets in abdomen ? Questionable small foci in L frontal and R parietal lobes on MRI brain, limited due to motion degradation; repeat MRI still limited by  ? Bronch w/ R lung bx, c/w adenocarcinoma; MET exon 14 splice variant deletion+ ? Large FDG-avid R lung mass with extension to the R hilum and paratracheal LN involvement; no distant mets  -07/2019 - present: palliative capmatinib  ? 12/2019: improving RUL and RLL malignancy and nodal metastasis; no new or metastatic disease   TREATMENT SUMMARY:  07/22/2019 - 8/27/202: capmatinib 339m BID   04/08/2020-present: comfort based care.   Interval History:  Jo Hendrix 86y.o. female with medical history significant for metastatic adenocarcinoma of the lung who presents for a follow up visit. The patient's last visit was on 05/04/2020. In the interim since the last visit she has continued on comfort based care.   On exam today Jo Hendrix is accompanied by her son.  She notes that she is having some aching on her chest that she notices mostly at night.  She reports that it is worse when she sleeps on her left side.  She notes it is more of a spasming pain that comes and goes quickly.  By the time she would take any medication the pain would have dissipated.  She notes that her energy has been poor but she has been able to take care of her activities of daily living such as going out, making lunch, and grocery shopping.  She notes that she is not  having any issues with shortness of breath, cough, or persistent chest pain.  She is not having any bone pain or pain anywhere else.  She denies any fevers, chills, sweats, nausea, or diarrhea.  A full 10 point ROS is listed below.  MEDICAL HISTORY:  Past Medical History:  Diagnosis Date  . ACE-inhibitor cough   . [place] disease) [place]   . Diastolic dysfunction   . History of tobacco use    smoked 25 years  . Hypertension   . Mild CAD   . Mild pulmonary hypertension ([place]   . OSA (obstructive sleep apnea)    Sleep Study 04/07/2007 - AHI during total sleep 21.30/hr, during REM 20.16/hr - BiPAP auto servo-ventilation unit    SURGICAL HISTORY: Past Surgical History:  Procedure Laterality Date  . benign lesion removed on lung  2000  . BLADDER REPAIR    . CARDIAC CATHETERIZATION  05/20/2007   mild coronary obstructive disease with 20% narrowing in prox LAD, diffuse luminal irregularity of 40-50% in mid RCA (Dr. TCorky Downs  . Cardiopulmonary Met Test  01/28/2012   with PFTs - FEV1 & FEV1/VC WNL, VC WNL, DLCO WNL; abnormal pulmonary response  . Carotid Doppler  04/2011   normal patency  . CARPAL TUNNEL RELEASE     x2  . CATARACT EXTRACTION, BILATERAL    . ENDOBRONCHIAL ULTRASOUND N/A 06/12/2019   Procedure: ENDOBRONCHIAL ULTRASOUND;  Surgeon: SCandee Furbish MD;  Location: [facility];  Service: Endoscopy;  Laterality:  N/A;  . FINE NEEDLE ASPIRATION  06/12/2019   Procedure: FINE NEEDLE ASPIRATION;  Surgeon: Candee Furbish, MD;  Location: [facility];  Service: Endoscopy;;  . GALLBLADDER SURGERY    . NM MYOCAR PERF WALL MOTION  09/2011   lexiscan myoview - normal perfusion, EF 77%, low risk  . Renal Doppler  05/2007   normal renal arteries   . TONSILLECTOMY    . TRANSTHORACIC ECHOCARDIOGRAM  2013   EF 50-55%; mild MR; mild TR; mild pulm valve regurg; aortic root sclerosis/calcification  . VAGINAL HYSTERECTOMY    . VARICOSE VEIN SURGERY    . VIDEO  BRONCHOSCOPY  06/12/2019   Procedure: VIDEO BRONCHOSCOPY;  Surgeon: Candee Furbish, MD;  Location: [facility];  Service: Endoscopy;;    SOCIAL HISTORY: Social History   Socioeconomic History  . Marital status: Widowed    Spouse name: Not on file  . Number of children: 3  . Years of education: Not on file  . Highest education level: Not on file  Occupational History  . Occupation: retired  Tobacco Use  . Smoking status: Former Smoker    Packs/day: 0.50    Years: 25.00    Pack years: 12.50    Types: Cigarettes    Quit date: 08/13/1984    Years since quitting: 35.8  . Smokeless tobacco: Never Used  Vaping Use  . Vaping Use: Never used  Substance and Sexual Activity  . Alcohol use: No  . Drug use: No  . Sexual activity: Not on file  Other Topics Concern  . Not on file  Social History Narrative  . Not on file   Social Determinants of Health   Financial Resource Strain:   . Difficulty of Paying Living Expenses: Not on file  Food Insecurity:   . Worried About Charity fundraiser in the Last Year: Not on file  . Ran Out of Food in the Last Year: Not on file  Transportation Needs:   . Lack of Transportation (Medical): Not on file  . Lack of Transportation (Non-Medical): Not on file  Physical Activity:   . Days of Exercise per Week: Not on file  . Minutes of Exercise per Session: Not on file  Stress:   . Feeling of Stress : Not on file  Social Connections:   . Frequency of Communication with Friends and Family: Not on file  . Frequency of Social Gatherings with Friends and Family: Not on file  . Attends Religious Services: Not on file  . Active Member of Clubs or Organizations: Not on file  . Attends Archivist Meetings: Not on file  . Marital Status: Not on file  Intimate Partner Violence:   . Fear of Current or Ex-Partner: Not on file  . Emotionally Abused: Not on file  . Physically Abused: Not on file  . Sexually Abused: Not on file    FAMILY  HISTORY: Family History  Problem Relation Age of Onset  . Lung cancer Mother   . Suicidality Father   . Cancer Sister   . Breast cancer Daughter   . Stroke Sister     ALLERGIES:  is allergic to acetaminophen, benadryl [diphenhydramine], zetia [ezetimibe], gabapentin, hydrochlorothiazide, ace inhibitors, codeine, erythromycin, etodolac, and ivp dye [iodinated diagnostic agents].  MEDICATIONS:  Current Outpatient Medications  Medication Sig Dispense Refill  . aspirin 81 MG tablet Take 81 mg by mouth daily.    . Calcium 1500 MG tablet Take 1,500 mg by mouth daily.    Marland Kitchen  cholecalciferol (VITAMIN D) 1000 UNITS tablet Take 1,000 Units by mouth daily. Take 1 tab daily    . gabapentin (NEURONTIN) 300 MG capsule Take 1 capsule (300 mg total) by mouth at bedtime. 90 capsule 3  . NON FORMULARY at bedtime. BiPAP    . Omega-3 Fatty Acids (FISH OIL) 1000 MG CAPS Take 1,000 mg by mouth daily.     . polyvinyl alcohol (LIQUIFILM TEARS) 1.4 % ophthalmic solution Place 1 drop into both eyes as needed for dry eyes.    . potassium chloride SA (KLOR-CON) 20 MEQ tablet Take 1 tablet (20 mEq total) by mouth daily. (Patient not taking: Reported on 05/04/2020) 30 tablet 1  . prednisoLONE acetate (PRED FORTE) 1 % ophthalmic suspension Place 1 drop into the right eye 4 (four) times daily. 10 mL 0  . RESTASIS 0.05 % ophthalmic emulsion Place 1 drop into both eyes 2 (two) times daily. Use 1 drop in each eye twice a day    . rosuvastatin (CRESTOR) 5 MG tablet TAKE 1 TABLET EVERY OTHER DAY 45 tablet 2  . torsemide (DEMADEX) 20 MG tablet Take 1 tablet (20 mg total) by mouth as needed. 90 tablet 2   No current facility-administered medications for this visit.    REVIEW OF SYSTEMS:   Constitutional: ( - ) fevers, ( - )  chills , ( - ) night sweats Eyes: ( - ) blurriness of vision, ( - ) double vision, ( - ) watery eyes Ears, nose, mouth, throat, and face: ( - ) mucositis, ( - ) sore throat Respiratory: ( - ) cough, (  - ) dyspnea, ( - ) wheezes Cardiovascular: ( - ) palpitation, ( - ) chest discomfort, ( - ) lower extremity swelling Gastrointestinal:  ( - ) nausea, ( - ) heartburn, ( - ) change in bowel habits Skin: ( - ) abnormal skin rashes Lymphatics: ( - ) new lymphadenopathy, ( - ) easy bruising Neurological: ( - ) numbness, ( - ) tingling, ( - ) new weaknesses Behavioral/Psych: ( - ) mood change, ( - ) new changes  All other systems were reviewed with the patient and are negative.  PHYSICAL EXAMINATION: ECOG PERFORMANCE STATUS: 1 - Symptomatic but completely ambulatory  Vitals:   06/22/20 1052  BP: 139/61  Pulse: 76  Resp: 18  Temp: 98 F (36.7 C)  SpO2: 95%   Filed Weights   06/22/20 1052  Weight: 150 lb 4.8 oz (68.2 kg)    GENERAL: well appearing elderly Caucasian female (appears considerably younger than state age) alert, no distress and comfortable SKIN: skin color, texture, turgor are normal, no rashes or significant lesions EYES: conjunctiva are pink and non-injected, sclera clearl LUNGS: clear to auscultation and percussion with normal breathing effort HEART: regular rate & rhythm and no murmurs and + 2 pitting edema in LE bilaterally. +1 in UE.  Musculoskeletal: no cyanosis of digits and no clubbing  PSYCH: alert & oriented x 3, fluent speech NEURO: no focal motor/sensory deficits  LABORATORY DATA:  I have reviewed the data as listed CBC Latest Ref Rng & Units 06/22/2020 05/04/2020 04/08/2020  WBC 4.0 - 10.5 K/uL 8.3 10.4 8.9  Hemoglobin 12.0 - 15.0 g/dL 12.6 12.8 12.0  Hematocrit 36 - 46 % 37.8 38.1 37.4  Platelets 150 - 400 K/uL 257 295 218    CMP Latest Ref Rng & Units 05/04/2020 04/08/2020 04/01/2020  Glucose 70 - 99 mg/dL 87 106(H) 89  BUN 8 - 23 mg/dL 26(H) 36(H)  45(H)  Creatinine 0.44 - 1.00 mg/dL 1.37(H) 2.04(H) 2.11(H)  Sodium 135 - 145 mmol/L 139 140 140  Potassium 3.5 - 5.1 mmol/L 3.6 3.5 3.9  Chloride 98 - 111 mmol/L 100 103 103  CO2 22 - 32 mmol/L 36(H)  33(H) 32  Calcium 8.9 - 10.3 mg/dL 9.6 8.8(L) 9.0  Total Protein 6.5 - 8.1 g/dL 7.1 6.0(L) 6.0(L)  Total Bilirubin 0.3 - 1.2 mg/dL 0.5 0.4 0.3  Alkaline Phos 38 - 126 U/L 66 82 81  AST 15 - 41 U/L 16 14(L) 12(L)  ALT 0 - 44 U/L _0 RADIOGRAPHIC STUDIES: I have personally reviewed the imaging and agree with the radiologists assessment: interval progression of the lung mass with on other clear sites of disease.   Color Fundus Photography Optos - OU - Both Eyes  Result Date: 06/09/2020 Right Eye Progression has improved. Disc findings include pallor. Notes Diffuse intraretinal hemorrhage inferiorly and inferotemporally from prior old central retinal vein occlusion partially compensated.  Good PRP for regions of previous neovascularization which triggered the prior vitreous hemorrhage OS with old collateralization on the optic nerve suggesting prior old hemicentral retinal vein occlusion superiorly, now Compensated   ASSESSMENT & PLAN Jo Hendrix 86 y.o. female with medical history significant for metastatic adenocarcinoma of the lung who presents for a follow up visit.  After review the labs, review the imaging, discussion with the patient her findings are most consistent with interval progression of disease while on capmatinib.  As such we discontinued this therapy and sent her tissue for PD-L1 status to determine if further treatment with immunotherapy would be viable. Unfortunately she has markedly low levels of PD-L1 and is not a candidate for immunotherapy.   On exam today Mrs. Jo Hendrix notes that overall she is doing well other than some aching in her chest.  Her energy is not good but has not gotten much worse.  She is not having any issues with shortness of breath or cough at this time.  Overall she is stable compared to her prior visit.  The bulk of our discussion focused on treatment options moving forward.  Unfortunately her TPS score is too low for consideration of  immunotherapy and therefore the only option available would be chemotherapy.  The patient notes that she does not wish to proceed with chemotherapy and I am in full agreement with this decision.  As such I would recommend comfort based care only at this time.  Fortunately at this time she is minimally symptomatic and therefore does not require referral to hospice at this moment.  I would recommend that she follow-up in our clinic in approximately 6 weeks time for reevaluation and monitoring of her symptoms.  #Stage IIIB ([id number]) adenocarcinoma of the R lung, unresectable, MET exon 14 splice variant deletion+ -last CT scan on 04/05/2020 showed progression of disease. This, coupled with the patient's poor tolerance is a good reason to HOLD further capmatinib therapy.  - her TPS score is too low for immunotherapy; she is not a good candidate for pembrolizumab monotherapy.  - PD-L1 status is not high enough to merit treatment with pembrolizumab. At this time I would recommend comfort based care/hospice.  ? Of note, patient has a history of contrast allergy, and requires premedication with Benadryl and prednisone -Periodic TSH monitoring   #Fluid retention, improving -Secondary to capmatinib, which has been discontinued -Overall improved from prior -Given the AKI, will monitor closely. AKI is stable  #Leukocytosis, resolved -Secondary  to steroid -subsequently normalized -We will monitor it closely   #AKI -Likely due to mild volume depletion and use of diuretic therapy as above -Cr 1.47 today, stable -will monitoring carefully while patient is on torsemide  No orders of the defined types were placed in this encounter.   All questions were answered. The patient knows to call the clinic with any problems, questions or concerns.  A total of more than 30 minutes were spent on this encounter and over half of that time was spent on counseling and coordination of care as outlined above.   Ledell Peoples, MD Department of Hematology/Oncology [place] [facility] Phone: [phone] Pager: [phone] Email: [email or web address]  06/22/2020 11:09 AM

## 2020-06-28 ENCOUNTER — Encounter: Payer: Self-pay | Admitting: Hematology and Oncology

## 2020-06-28 ENCOUNTER — Telehealth: Payer: Self-pay | Admitting: Hematology and Oncology

## 2020-06-28 NOTE — Telephone Encounter (Signed)
Scheduled appt per 11/16 sch msg - left message for patient with appt date and time

## 2020-08-04 ENCOUNTER — Ambulatory Visit (INDEPENDENT_AMBULATORY_CARE_PROVIDER_SITE_OTHER): Payer: Medicare HMO | Admitting: Ophthalmology

## 2020-08-04 ENCOUNTER — Encounter (INDEPENDENT_AMBULATORY_CARE_PROVIDER_SITE_OTHER): Payer: Medicare HMO | Admitting: Ophthalmology

## 2020-08-04 ENCOUNTER — Other Ambulatory Visit: Payer: Self-pay

## 2020-08-04 ENCOUNTER — Encounter (INDEPENDENT_AMBULATORY_CARE_PROVIDER_SITE_OTHER): Payer: Self-pay | Admitting: Ophthalmology

## 2020-08-04 DIAGNOSIS — H348111 Central retinal vein occlusion, right eye, with retinal neovascularization: Secondary | ICD-10-CM

## 2020-08-04 DIAGNOSIS — H3561 Retinal hemorrhage, right eye: Secondary | ICD-10-CM | POA: Diagnosis not present

## 2020-08-04 DIAGNOSIS — H35351 Cystoid macular degeneration, right eye: Secondary | ICD-10-CM

## 2020-08-04 NOTE — Progress Notes (Signed)
08/04/2020     CHIEF COMPLAINT Patient presents for Post-op Follow-up (8 WK PO OD (Sx 06/02/20) ///Pt reports stable vision OD, no new F/F OD, no pain or pressure OD. )   HISTORY OF PRESENT ILLNESS: Jo Hendrix is a 86 y.o. female who presents to the clinic today for:   HPI    Post-op Follow-up    In right eye.  Vision is stable. Additional comments: 8 WK PO OD (Sx 06/02/20)    Pt reports stable vision OD, no new F/F OD, no pain or pressure OD.        Last edited by Nichola Sizer D on 08/04/2020 10:29 AM. (History)      Referring physician: Buzzy Han, MD [place],  [place] [address]  HISTORICAL INFORMATION:   Selected notes from the MEDICAL RECORD NUMBER       CURRENT MEDICATIONS: Current Outpatient Medications (Ophthalmic Drugs)  Medication Sig  . polyvinyl alcohol (LIQUIFILM TEARS) 1.4 % ophthalmic solution Place 1 drop into both eyes as needed for dry eyes. (Patient not taking: Reported on 08/04/2020)  . prednisoLONE acetate (PRED FORTE) 1 % ophthalmic suspension Place 1 drop into the right eye 4 (four) times daily. (Patient not taking: Reported on 08/04/2020)  . RESTASIS 0.05 % ophthalmic emulsion Place 1 drop into both eyes 2 (two) times daily. Use 1 drop in each eye twice a day (Patient not taking: Reported on 08/04/2020)   No current facility-administered medications for this visit. (Ophthalmic Drugs)   Current Outpatient Medications (Other)  Medication Sig  . aspirin 81 MG tablet Take 81 mg by mouth daily.  . Calcium 1500 MG tablet Take 1,500 mg by mouth daily.  . cholecalciferol (VITAMIN D) 1000 UNITS tablet Take 1,000 Units by mouth daily. Take 1 tab daily  . gabapentin (NEURONTIN) 300 MG capsule Take 1 capsule (300 mg total) by mouth at bedtime.  . NON FORMULARY at bedtime. BiPAP  . Omega-3 Fatty Acids (FISH OIL) 1000 MG CAPS Take 1,000 mg by mouth daily.   . potassium chloride SA (KLOR-CON) 20 MEQ tablet Take 1 tablet  (20 mEq total) by mouth daily. (Patient not taking: Reported on 05/04/2020)  . rosuvastatin (CRESTOR) 5 MG tablet TAKE 1 TABLET EVERY OTHER DAY  . torsemide (DEMADEX) 20 MG tablet Take 1 tablet (20 mg total) by mouth as needed.   No current facility-administered medications for this visit. (Other)      REVIEW OF SYSTEMS:    ALLERGIES Allergies  Allergen Reactions  . Acetaminophen Anaphylaxis and Other (See Comments)    Difficulty urinating Difficulty urinating   . Benadryl [Diphenhydramine] Other (See Comments)    Tongue gets thick, feel jittery  . Zetia [Ezetimibe] Other (See Comments)    Chest pressure  . Gabapentin     dizziness  . Hydrochlorothiazide Nausea And Vomiting    Dizziness  . Ace Inhibitors Cough  . Codeine Other (See Comments)    difficulty urinating  . Erythromycin Other (See Comments)    DOESN'T REMEMBER   . Etodolac Nausea Only  . Ivp Dye [Iodinated Diagnostic Agents] Hives    Ok to proceed 13 hour prep without benadryl per Radiologist (Dr. Janeece Fitting 12/21/19 hp)     PAST MEDICAL HISTORY Past Medical History:  Diagnosis Date  . ACE-inhibitor cough   . COPD (chronic obstructive pulmonary disease) ([place])   . Diastolic dysfunction   . History of tobacco use    smoked 25 years  . Hypertension   .  Mild CAD   . Mild pulmonary hypertension ([place])   . OSA (obstructive sleep apnea)    Sleep Study 04/07/2007 - AHI during total sleep 21.30/hr, during REM 20.16/hr - BiPAP auto servo-ventilation unit   Past Surgical History:  Procedure Laterality Date  . benign lesion removed on lung  2000  . BLADDER REPAIR    . CARDIAC CATHETERIZATION  05/20/2007   mild coronary obstructive disease with 20% narrowing in prox LAD, diffuse luminal irregularity of 40-50% in mid RCA (Dr. Corky Downs)  . Cardiopulmonary Met Test  01/28/2012   with PFTs - FEV1 & FEV1/VC WNL, VC WNL, DLCO WNL; abnormal pulmonary response  . Carotid Doppler  04/2011   normal patency  . CARPAL TUNNEL  RELEASE     x2  . CATARACT EXTRACTION, BILATERAL    . ENDOBRONCHIAL ULTRASOUND N/A 06/12/2019   Procedure: ENDOBRONCHIAL ULTRASOUND;  Surgeon: Candee Furbish, MD;  Location: [facility];  Service: Endoscopy;  Laterality: N/A;  . FINE NEEDLE ASPIRATION  06/12/2019   Procedure: FINE NEEDLE ASPIRATION;  Surgeon: Candee Furbish, MD;  Location: [facility];  Service: Endoscopy;;  . GALLBLADDER SURGERY    . NM MYOCAR PERF WALL MOTION  09/2011   lexiscan myoview - normal perfusion, EF 77%, low risk  . Renal Doppler  05/2007   normal renal arteries   . TONSILLECTOMY    . TRANSTHORACIC ECHOCARDIOGRAM  2013   EF 50-55%; mild MR; mild TR; mild pulm valve regurg; aortic root sclerosis/calcification  . VAGINAL HYSTERECTOMY    . VARICOSE VEIN SURGERY    . VIDEO BRONCHOSCOPY  06/12/2019   Procedure: VIDEO BRONCHOSCOPY;  Surgeon: Candee Furbish, MD;  Location: [facility];  Service: Endoscopy;;    FAMILY HISTORY Family History  Problem Relation Age of Onset  . Lung cancer Mother   . Suicidality Father   . Cancer Sister   . Breast cancer Daughter   . Stroke Sister     SOCIAL HISTORY Social History   Tobacco Use  . Smoking status: Former Smoker    Packs/day: 0.50    Years: 25.00    Pack years: 12.50    Types: Cigarettes    Quit date: 08/13/1984    Years since quitting: 36.0  . Smokeless tobacco: Never Used  Vaping Use  . Vaping Use: Never used  Substance Use Topics  . Alcohol use: No  . Drug use: No         OPHTHALMIC EXAM: Base Eye Exam    Visual Acuity (ETDRS)      Right Left   Dist [place] E Card @ 6 ft 20/30 -1   Dist ph [place] NI NI   Correction: Glasses       Tonometry (Tonopen, 10:35 AM)      Right Left   Pressure 12 15       Pupils      Dark Light Shape React APD   Right 4 4 Round Minimal +1   Left 4 3 Round Brisk None       Visual Fields (Counting fingers)      Left Right    Full    Restrictions  Total inferior temporal deficiency       Extraocular  Movement      Right Left    Full Full       Neuro/Psych    Oriented x3: Yes   Mood/Affect: Normal       Dilation    Right eye: 1.0% Mydriacyl, 2.5%  Phenylephrine @ 10:35 AM        Slit Lamp and Fundus Exam    External Exam      Right Left   External Normal        Slit Lamp Exam      Right Left   Lids/Lashes Trace edema.    Conjunctiva/Sclera 2+ Injection    Cornea Clear    Anterior Chamber Deep and quiet    Iris Round and reactive    Lens Centered posterior chamber intraocular lens    Anterior Vitreous Clear, vitrectomized, rare RBC        Fundus Exam      Right Left   Posterior Vitreous Clear vitrectomized,     Disc Premacular pigmentary deposits, no change from time of surgery    C/D Ratio 0.4    Macula Macula attached, Cystoid macular edema, Intraretinal hemorrhage, Microaneurysms    Vessels Old central retinal vein occlusion, with thready poor peripheral vascularization    Periphery Attached, good PRP 360           IMAGING AND PROCEDURES  Imaging and Procedures for 08/04/20  Color Fundus Photography Optos - OU - Both Eyes       Right Eye Progression has improved. Macula : edema, microaneurysms.   Notes Early collateralization of vessels on the optic nerve OD, intraretinal hemorrhages, microaneurysms and CME noted OD  Good PRP 360, no neovascularization remains OD  OS, clear media, Apparent old superior hemicentral vein occlusion OS, not active disease at this time.                ASSESSMENT/PLAN:  No problem-specific Assessment & Plan notes found for this encounter.      ICD-10-CM   1. 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 Cystoid macular edema of right eye  H35.351 Color Fundus Photography Optos - OU - Both Eyes  3. Retinal hemorrhage noted on examination, right  H35.61 Color Fundus Photography Optos - OU - Both Eyes    1.  OD, vastly  improved status post vitrectomy for dense vitreous hemorrhage related to neovascularization secondary to old central retinal vein occlusion.  There is apparent residual cystoid macular edema retinal thickening noted OD.  For this reason she return in 3 weeks  2.  OD plan OCT and plan intravitreal Avastin for CRV O with CME  3.  Ophthalmic Meds Ordered this visit:  No orders of the defined types were placed in this encounter.      Return in about 3 weeks (around 08/25/2020) for dilate, AVASTIN OCT, OD.  There are no Patient Instructions on file for this visit.   Explained the diagnoses, plan, and follow up with the patient and they expressed understanding.  Patient expressed understanding of the importance of proper follow up care.   Clent Demark Kyl Givler M.D. Diseases & Surgery of the Retina and Vitreous [facility] 08/04/20     Abbreviations: M myopia (nearsighted); A astigmatism; H hyperopia (farsighted); P presbyopia; Mrx spectacle prescription;  CTL contact lenses; OD right eye; OS left eye; OU both eyes  XT exotropia; ET esotropia; PEK punctate epithelial keratitis; PEE punctate epithelial erosions; DES dry eye syndrome; MGD meibomian gland dysfunction; ATs artificial tears; PFAT's preservative free artificial tears; [place] nuclear sclerotic cataract; PSC posterior subcapsular cataract; ERM epi-retinal membrane; PVD posterior vitreous detachment; RD retinal detachment; DM diabetes mellitus; DR diabetic retinopathy; NPDR non-proliferative  diabetic retinopathy; PDR proliferative diabetic retinopathy; CSME clinically significant macular edema; DME diabetic macular edema; dbh dot blot hemorrhages; CWS cotton wool spot; POAG primary open angle glaucoma; C/D cup-to-disc ratio; HVF humphrey visual field; GVF goldmann visual field; OCT optical coherence tomography; IOP intraocular pressure; BRVO Branch retinal vein occlusion; CRVO central retinal vein occlusion; CRAO central retinal  artery occlusion; BRAO branch retinal artery occlusion; RT retinal tear; SB scleral buckle; PPV pars plana vitrectomy; VH Vitreous hemorrhage; PRP panretinal laser photocoagulation; IVK intravitreal kenalog; VMT vitreomacular traction; MH Macular hole;  NVD neovascularization of the disc; NVE neovascularization elsewhere; AREDS age related eye disease study; ARMD age related macular degeneration; POAG primary open angle glaucoma; EBMD epithelial/anterior basement membrane dystrophy; ACIOL anterior chamber intraocular lens; IOL intraocular lens; PCIOL posterior chamber intraocular lens; Phaco/IOL phacoemulsification with intraocular lens placement; [place] photorefractive keratectomy; LASIK laser assisted in situ keratomileusis; HTN hypertension; DM diabetes mellitus; COPD chronic obstructive pulmonary disease

## 2020-08-17 ENCOUNTER — Other Ambulatory Visit: Payer: Self-pay

## 2020-08-17 ENCOUNTER — Inpatient Hospital Stay: Payer: Medicare HMO

## 2020-08-17 ENCOUNTER — Inpatient Hospital Stay: Payer: Medicare HMO | Attending: Hematology | Admitting: Hematology and Oncology

## 2020-08-17 ENCOUNTER — Encounter: Payer: Self-pay | Admitting: Hematology and Oncology

## 2020-08-17 ENCOUNTER — Other Ambulatory Visit: Payer: Self-pay | Admitting: Hematology and Oncology

## 2020-08-17 VITALS — BP 118/70 | HR 97 | Temp 98.1°F | Resp 15 | Ht 59.0 in | Wt 142.2 lb

## 2020-08-17 DIAGNOSIS — D72829 Elevated white blood cell count, unspecified: Secondary | ICD-10-CM | POA: Diagnosis not present

## 2020-08-17 DIAGNOSIS — C3491 Malignant neoplasm of unspecified part of right bronchus or lung: Secondary | ICD-10-CM

## 2020-08-17 DIAGNOSIS — R110 Nausea: Secondary | ICD-10-CM

## 2020-08-17 DIAGNOSIS — R609 Edema, unspecified: Secondary | ICD-10-CM | POA: Diagnosis not present

## 2020-08-17 DIAGNOSIS — Z79899 Other long term (current) drug therapy: Secondary | ICD-10-CM | POA: Diagnosis not present

## 2020-08-17 DIAGNOSIS — N179 Acute kidney failure, unspecified: Secondary | ICD-10-CM

## 2020-08-17 DIAGNOSIS — C3481 Malignant neoplasm of overlapping sites of right bronchus and lung: Secondary | ICD-10-CM | POA: Diagnosis not present

## 2020-08-17 DIAGNOSIS — C3490 Malignant neoplasm of unspecified part of unspecified bronchus or lung: Secondary | ICD-10-CM

## 2020-08-17 LAB — CBC WITH DIFFERENTIAL (CANCER CENTER ONLY)
Abs Immature Granulocytes: 0.09 10*3/uL — ABNORMAL HIGH (ref 0.00–0.07)
Basophils Absolute: 0 10*3/uL (ref 0.0–0.1)
Basophils Relative: 0 %
Eosinophils Absolute: 0.3 10*3/uL (ref 0.0–0.5)
Eosinophils Relative: 2 %
HCT: 37.4 % (ref 36.0–46.0)
Hemoglobin: 12.8 g/dL (ref 12.0–15.0)
Immature Granulocytes: 1 %
Lymphocytes Relative: 17 %
Lymphs Abs: 2.1 10*3/uL (ref 0.7–4.0)
MCH: 29.9 pg (ref 26.0–34.0)
MCHC: 34.2 g/dL (ref 30.0–36.0)
MCV: 87.4 fL (ref 80.0–100.0)
Monocytes Absolute: 0.7 10*3/uL (ref 0.1–1.0)
Monocytes Relative: 6 %
Neutro Abs: 9.3 10*3/uL — ABNORMAL HIGH (ref 1.7–7.7)
Neutrophils Relative %: 74 %
Platelet Count: 323 10*3/uL (ref 150–400)
RBC: 4.28 MIL/uL (ref 3.87–5.11)
RDW: 13.3 % (ref 11.5–15.5)
WBC Count: 12.5 10*3/uL — ABNORMAL HIGH (ref 4.0–10.5)
nRBC: 0 % (ref 0.0–0.2)

## 2020-08-17 LAB — CMP (CANCER CENTER ONLY)
ALT: 8 U/L (ref 0–44)
AST: 14 U/L — ABNORMAL LOW (ref 15–41)
Albumin: 3.3 g/dL — ABNORMAL LOW (ref 3.5–5.0)
Alkaline Phosphatase: 69 U/L (ref 38–126)
Anion gap: 13 (ref 5–15)
BUN: 30 mg/dL — ABNORMAL HIGH (ref 8–23)
CO2: 29 mmol/L (ref 22–32)
Calcium: 9.1 mg/dL (ref 8.9–10.3)
Chloride: 100 mmol/L (ref 98–111)
Creatinine: 1.75 mg/dL — ABNORMAL HIGH (ref 0.44–1.00)
GFR, Estimated: 27 mL/min — ABNORMAL LOW (ref >60)
Glucose, Bld: 92 mg/dL (ref 70–99)
Potassium: 4 mmol/L (ref 3.5–5.1)
Sodium: 142 mmol/L (ref 135–145)
Total Bilirubin: 0.3 mg/dL (ref 0.3–1.2)
Total Protein: 7.7 g/dL (ref 6.5–8.1)

## 2020-08-17 MED ORDER — ONDANSETRON HCL 8 MG PO TABS: 8.0000 mg | ORAL_TABLET | Freq: Three times a day (TID) | ORAL | 1 refills | Status: AC | PRN

## 2020-08-17 MED ORDER — MIRTAZAPINE 7.5 MG PO TABS: 7.5000 mg | ORAL_TABLET | Freq: Every day | ORAL | 2 refills | Status: AC

## 2020-08-17 NOTE — Progress Notes (Signed)
[facility] [phone]   [phone]  PROGRESS NOTE  Patient Care Team: Buzzy Han, MD as PCP - General (Family Medicine) Troy Sine, MD as PCP - Cardiology (Cardiology)  Hematological/Oncological History HEME/ONC OVERVIEW: 1. Stage IIIB ([id number]) adenocarcinoma of the R lung, unresectable, MET exon 14 splice variant deletion+ -Late 05/2019:  ? A large R lung mass extending from the RUL to RLL with mediastinal invasion, R hilar denopathy on CT; no mets in abdomen ? Questionable small foci in L frontal and R parietal lobes on MRI brain, limited due to motion degradation; repeat MRI still limited by  ? Bronch w/ R lung bx, c/w adenocarcinoma; MET exon 14 splice variant deletion+ ? Large FDG-avid R lung mass with extension to the R hilum and paratracheal LN involvement; no distant mets  -07/2019 - present: palliative capmatinib  ? 12/2019: improving RUL and RLL malignancy and nodal metastasis; no new or metastatic disease   TREATMENT SUMMARY:  07/22/2019 - 04/08/2020: capmatinib 343m BID   04/08/2020-present: comfort based care.   Interval History:  Jo Hendrix 87y.o. female with medical history significant for metastatic adenocarcinoma of the lung who presents for a follow up visit. The patient's last visit was on 06/22/2020. In the interim since the last visit she has continued on comfort based care.   On exam today Jo Hendrix is accompanied by her daughter in law.  She notes she has been having some continued issues with fatigue and that a few weeks ago she had a spell where she was "completely wiped out".  She notes that the swelling for the most part is gone from her legs and extremities.  She has had an increase in nausea and has been taking her Zofran medication or to help with this.  She is also been having difficulty with sleep and has only been getting a few hours of sleep at a time.  She reports that she does occasionally  have an ache in her chest and that when she changes position it has resolved.  She denies currently having any fevers, chills, sweats, diarrhea, vomiting or diarrhea.  She is also not having any shortness of breath.  Overall she is stable and willing and able to continue with close clinical monitoring at this time.  A full 10 point ROS is listed below.  MEDICAL HISTORY:  Past Medical History:  Diagnosis Date  . ACE-inhibitor cough   . [place]   . Diastolic dysfunction   . History of tobacco use    smoked 25 years  . Hypertension   . Mild CAD   . Mild pulmonary hypertension ([place]   . OSA (obstructive sleep apnea)    Sleep Study 04/07/2007 - AHI during total sleep 21.30/hr, during REM 20.16/hr - BiPAP auto servo-ventilation unit    SURGICAL HISTORY: Past Surgical History:  Procedure Laterality Date  . benign lesion removed on lung  2000  . BLADDER REPAIR    . CARDIAC CATHETERIZATION  05/20/2007   mild coronary obstructive disease with 20% narrowing in prox LAD, diffuse luminal irregularity of 40-50% in mid RCA (Dr. TCorky Downs  . Cardiopulmonary Met Test  01/28/2012   with PFTs - FEV1 & FEV1/VC WNL, VC WNL, DLCO WNL; abnormal pulmonary response  . Carotid Doppler  04/2011   normal patency  . CARPAL TUNNEL RELEASE     x2  . CATARACT EXTRACTION, BILATERAL    . ENDOBRONCHIAL ULTRASOUND N/A 06/12/2019  Procedure: ENDOBRONCHIAL ULTRASOUND;  Surgeon: Candee Furbish, MD;  Location: [facility];  Service: Endoscopy;  Laterality: N/A;  . FINE NEEDLE ASPIRATION  06/12/2019   Procedure: FINE NEEDLE ASPIRATION;  Surgeon: Candee Furbish, MD;  Location: [facility];  Service: Endoscopy;;  . GALLBLADDER SURGERY    . NM MYOCAR PERF WALL MOTION  09/2011   lexiscan myoview - normal perfusion, EF 77%, low risk  . Renal Doppler  05/2007   normal renal arteries   . TONSILLECTOMY    . TRANSTHORACIC ECHOCARDIOGRAM  2013   EF 50-55%; mild MR; mild TR; mild  pulm valve regurg; aortic root sclerosis/calcification  . VAGINAL HYSTERECTOMY    . VARICOSE VEIN SURGERY    . VIDEO BRONCHOSCOPY  06/12/2019   Procedure: VIDEO BRONCHOSCOPY;  Surgeon: Candee Furbish, MD;  Location: [facility];  Service: Endoscopy;;    SOCIAL HISTORY: Social History   Socioeconomic History  . Marital status: Widowed    Spouse name: Not on file  . Number of children: 3  . Years of education: Not on file  . Highest education level: Not on file  Occupational History  . Occupation: retired  Tobacco Use  . Smoking status: Former Smoker    Packs/day: 0.50    Years: 25.00    Pack years: 12.50    Types: Cigarettes    Quit date: 08/13/1984    Years since quitting: 36.0  . Smokeless tobacco: Never Used  Vaping Use  . Vaping Use: Never used  Substance and Sexual Activity  . Alcohol use: No  . Drug use: No  . Sexual activity: Not on file  Other Topics Concern  . Not on file  Social History Narrative  . Not on file   Social Determinants of Health   Financial Resource Strain: Not on file  Food Insecurity: Not on file  Transportation Needs: Not on file  Physical Activity: Not on file  Stress: Not on file  Social Connections: Not on file  Intimate Partner Violence: Not on file    FAMILY HISTORY: Family History  Problem Relation Age of Onset  . Lung cancer Mother   . Suicidality Father   . Cancer Sister   . Breast cancer Daughter   . Stroke Sister     ALLERGIES:  is allergic to acetaminophen, benadryl [diphenhydramine], zetia [ezetimibe], gabapentin, hydrochlorothiazide, ace inhibitors, codeine, erythromycin, etodolac, and ivp dye [iodinated diagnostic agents].  MEDICATIONS:  Current Outpatient Medications  Medication Sig Dispense Refill  . mirtazapine (REMERON) 7.5 MG tablet Take 1 tablet (7.5 mg total) by mouth at bedtime. 30 tablet 2  . ondansetron (ZOFRAN) 8 MG tablet Take 1 tablet (8 mg total) by mouth every 8 (eight) hours as needed for nausea  or vomiting. 30 tablet 1  . torsemide (DEMADEX) 20 MG tablet Take 1 tablet (20 mg total) by mouth as needed. 90 tablet 2  . aspirin 81 MG tablet Take 81 mg by mouth daily.    . cholecalciferol (VITAMIN D) 1000 UNITS tablet Take 1,000 Units by mouth daily. Take 1 tab daily    . gabapentin (NEURONTIN) 300 MG capsule Take 1 capsule (300 mg total) by mouth at bedtime. 90 capsule 3  . NON FORMULARY at bedtime. BiPAP    . Omega-3 Fatty Acids (FISH OIL) 1000 MG CAPS Take 1,000 mg by mouth daily.     . rosuvastatin (CRESTOR) 5 MG tablet TAKE 1 TABLET EVERY OTHER DAY 45 tablet 2   No current facility-administered medications for this  visit.    REVIEW OF SYSTEMS:   Constitutional: ( - ) fevers, ( - )  chills , ( - ) night sweats Eyes: ( - ) blurriness of vision, ( - ) double vision, ( - ) watery eyes Ears, nose, mouth, throat, and face: ( - ) mucositis, ( - ) sore throat Respiratory: ( - ) cough, ( - ) dyspnea, ( - ) wheezes Cardiovascular: ( - ) palpitation, ( - ) chest discomfort, ( - ) lower extremity swelling Gastrointestinal:  ( - ) nausea, ( - ) heartburn, ( - ) change in bowel habits Skin: ( - ) abnormal skin rashes Lymphatics: ( - ) new lymphadenopathy, ( - ) easy bruising Neurological: ( - ) numbness, ( - ) tingling, ( - ) new weaknesses Behavioral/Psych: ( - ) mood change, ( - ) new changes  All other systems were reviewed with the patient and are negative.  PHYSICAL EXAMINATION: ECOG PERFORMANCE STATUS: 1 - Symptomatic but completely ambulatory  Vitals:   08/17/20 1128  BP: 118/70  Pulse: 97  Resp: 15  Temp: 98.1 F (36.7 C)  SpO2: 96%   Filed Weights   08/17/20 1128  Weight: 142 lb 3.2 oz (64.5 kg)    GENERAL: well appearing elderly Caucasian female (appears considerably younger than state age) alert, no distress and comfortable SKIN: skin color, texture, turgor are normal, no rashes or significant lesions EYES: conjunctiva are pink and non-injected, sclera  clearl LUNGS: clear to auscultation and percussion with normal breathing effort HEART: regular rate & rhythm and no murmurs and + 2 pitting edema in LE bilaterally. +1 in UE.  Musculoskeletal: no cyanosis of digits and no clubbing  PSYCH: alert & oriented x 3, fluent speech NEURO: no focal motor/sensory deficits  LABORATORY DATA:  I have reviewed the data as listed CBC Latest Ref Rng & Units 08/17/2020 06/22/2020 05/04/2020  WBC 4.0 - 10.5 K/uL 12.5(H) 8.3 10.4  Hemoglobin 12.0 - 15.0 g/dL 12.8 12.6 12.8  Hematocrit 36.0 - 46.0 % 37.4 37.8 38.1  Platelets 150 - 400 K/uL 323 257 295    CMP Latest Ref Rng & Units 08/17/2020 06/22/2020 05/04/2020  Glucose 70 - 99 mg/dL 92 81 87  BUN 8 - 23 mg/dL 30(H) 23 26(H)  Creatinine 0.44 - 1.00 mg/dL 1.75(H) 1.47(H) 1.37(H)  Sodium 135 - 145 mmol/L 142 140 139  Potassium 3.5 - 5.1 mmol/L 4.0 3.6 3.6  Chloride 98 - 111 mmol/L 100 100 100  CO2 22 - 32 mmol/L 29 30 36(H)  Calcium 8.9 - 10.3 mg/dL 9.1 8.5(L) 9.6  Total Protein 6.5 - 8.1 g/dL 7.7 7.2 7.1  Total Bilirubin 0.3 - 1.2 mg/dL 0.3 0.4 0.5  Alkaline Phos 38 - 126 U/L 69 78 66  AST 15 - 41 U/L 14(L) 16 16  ALT 0 - 44 U/L '8 9 12    ' RADIOGRAPHIC STUDIES: I have personally reviewed the imaging and agree with the radiologists assessment: interval progression of the lung mass with on other clear sites of disease.   Color Fundus Photography Optos - OU - Both Eyes  Result Date: 08/04/2020 Right Eye Progression has improved. Macula : edema, microaneurysms. 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   ASSESSMENT & PLAN Jo Hendrix 87 y.o. female with medical history significant  for metastatic adenocarcinoma of the lung who presents for a follow up visit.  After review the  labs, review the imaging, discussion with the patient her findings are most consistent with interval progression of disease while on capmatinib.  As such we discontinued this therapy and sent her tissue for PD-L1 status to determine if further treatment with immunotherapy would be viable. Unfortunately she has markedly low levels of PD-L1 and is not a candidate for immunotherapy.   On exam today Jo Hendrix is having some difficulties with nausea and with sleeping.  We will prescribe for her Remeron in order to try to help with her appetite as well as sleeping and additionally we will refill her Zofran today.  Otherwise she is having some soreness in her chest but is quite stable.  Previously we had a discussion focused on treatment options moving forward.  Unfortunately her TPS score is too low for consideration of immunotherapy and therefore the only option available would be chemotherapy.  The patient notes that she does not wish to proceed with chemotherapy and I am in full agreement with this decision.  As such I would recommend comfort based care only at this time.  Fortunately at this time she is minimally symptomatic and therefore does not require referral to hospice at this moment.  I would recommend that she follow-up in our clinic in approximately 6 weeks time for reevaluation and monitoring of her symptoms.  #Stage IIIB ([id number]) adenocarcinoma of the R lung, unresectable, MET exon 14 splice variant deletion+ -last CT scan on 04/05/2020 showed progression of disease. This, coupled with the patient's poor tolerance is a good reason to HOLD further capmatinib therapy.  - her TPS score is too low for immunotherapy; she is not a good candidate for pembrolizumab monotherapy.  - PD-L1 status is not high enough to merit treatment with pembrolizumab. At this time I would recommend comfort based care/hospice.  ? Of note, patient has a history of contrast allergy, and requires premedication with Benadryl  and prednisone -Periodic TSH monitoring  -discussed hospice with patient. She is not currently debilitated/ill enough to require this. We can make the referral at any time or if she is having worsening progression of symptoms.   #Symptom Management --refill zofran today due to increased nausea --prescribe remeron 7.81m nightly   #Fluid retention, improving -Secondary to capmatinib, which has been discontinued -Overall improved from prior -Given the AKI, will monitor closely. AKI is stable  #Leukocytosis -modest elevation today -We will monitor it closely   #AKI -Likely due to mild volume depletion and use of diuretic therapy as above -Cr 1.7 today, stable -will monitoring carefully while patient is on torsemide  No orders of the defined types were placed in this encounter.   All questions were answered. The patient knows to call the clinic with any problems, questions or concerns.  A total of more than 30 minutes were spent on this encounter and over half of that time was spent on counseling and coordination of care as outlined above.   JLedell Peoples MD Department of Hematology/Oncology [facility]at [facility] Surgical CenterPhone: [phone]: [phone]: [email or web address]  08/17/2020 12:11 PM

## 2020-08-19 ENCOUNTER — Telehealth: Payer: Self-pay | Admitting: Hematology and Oncology

## 2020-08-19 NOTE — Telephone Encounter (Signed)
Scheduled per 1/5 los. Called and spoke with pt, confirmed 2/14 appts

## 2020-08-25 ENCOUNTER — Encounter (INDEPENDENT_AMBULATORY_CARE_PROVIDER_SITE_OTHER): Payer: Medicare HMO | Admitting: Ophthalmology

## 2020-08-31 ENCOUNTER — Telehealth: Payer: Self-pay | Admitting: *Deleted

## 2020-08-31 NOTE — Telephone Encounter (Signed)
Received call from patient . She states that she is having ongoing pain/discomfort in the center of her back and the center of her chest. This is not relieved with Tylenol. Denies fever or chills/SOB  Please advise.

## 2020-09-01 ENCOUNTER — Other Ambulatory Visit: Payer: Self-pay | Admitting: Hematology and Oncology

## 2020-09-01 MED ORDER — TRAMADOL HCL 50 MG PO TABS: 50.0000 mg | ORAL_TABLET | Freq: Three times a day (TID) | ORAL | 0 refills | Status: DC | PRN

## 2020-09-01 NOTE — Telephone Encounter (Signed)
Received call from pt's son, Mana Haberl. He is asking if Dr. Lorenso Courier has sent in the prescription for pain medicine yet.  Advised that I reminded him this morning and that he is sending it this morning. Merry Proud also states that Zarie is not eating much these days and is weaker. He asked if she needs to go to the hospital for "IV calories"  Discussed with Merry Proud that her lung cancer is progressing and that she is no longer on treatment. Advised that this change in appetite is  Natural at this stage in her illness. Discussed hospice referral with him and he and the patient are agreeable to this now.  Advised that Hospice will likely call this afternoon or tomorrow to set up their initial visit. Merry Proud voiced understanding.  Hospice referral made to [facility] and [facility].

## 2020-09-05 ENCOUNTER — Encounter (INDEPENDENT_AMBULATORY_CARE_PROVIDER_SITE_OTHER): Payer: Self-pay | Admitting: Ophthalmology

## 2020-09-05 ENCOUNTER — Ambulatory Visit (INDEPENDENT_AMBULATORY_CARE_PROVIDER_SITE_OTHER): Payer: Medicare HMO | Admitting: Ophthalmology

## 2020-09-05 ENCOUNTER — Other Ambulatory Visit: Payer: Self-pay

## 2020-09-05 ENCOUNTER — Telehealth: Payer: Self-pay | Admitting: *Deleted

## 2020-09-05 DIAGNOSIS — H348111 Central retinal vein occlusion, right eye, with retinal neovascularization: Secondary | ICD-10-CM | POA: Diagnosis not present

## 2020-09-05 DIAGNOSIS — H34811 Central retinal vein occlusion, right eye, with macular edema: Secondary | ICD-10-CM

## 2020-09-05 DIAGNOSIS — H4311 Vitreous hemorrhage, right eye: Secondary | ICD-10-CM | POA: Diagnosis not present

## 2020-09-05 MED ORDER — MIRTAZAPINE 7.5 MG PO TABS: 7.5000 mg | ORAL_TABLET | Freq: Every day | ORAL | 1 refills | Status: AC

## 2020-09-05 MED ORDER — BEVACIZUMAB 2.5 MG/0.1ML IZ SOSY
2.5000 mg | PREFILLED_SYRINGE | INTRAVITREAL | Status: AC | PRN
  Administered 2020-09-05: 2.5 mg via INTRAVITREAL

## 2020-09-05 NOTE — Progress Notes (Signed)
09/05/2020     CHIEF COMPLAINT Patient presents for Retina Follow Up (3 WK FU OD, POSS AVASTIN OD///Pt report stable vision OU, pt denies any new F/F, pain, or pressure OU. )   HISTORY OF PRESENT ILLNESS: Jo Hendrix is a 87 y.o. female who presents to the clinic today for:   HPI    Retina Follow Up    Patient presents with  CRVO/BRVO.  In right eye.  This started 3 weeks ago.  Duration of 3 weeks.  Since onset it is stable. Additional comments: 3 WK FU OD, POSS AVASTIN OD   Pt report stable vision OU, pt denies any new F/F, pain, or pressure OU.        Last edited by Nichola Sizer D on 09/05/2020  1:08 PM. (History)      Referring physician: Buzzy Han, MD [place],  [place] [id number]  HISTORICAL INFORMATION:   Selected notes from the [place]: No current outpatient medications on file. (Ophthalmic Drugs)   No current facility-administered medications for this visit. (Ophthalmic Drugs)   Current Outpatient Medications (Other)  Medication Sig  . aspirin 81 MG tablet Take 81 mg by mouth daily.  . cholecalciferol (VITAMIN D) 1000 UNITS tablet Take 1,000 Units by mouth daily. Take 1 tab daily  . gabapentin (NEURONTIN) 300 MG capsule Take 1 capsule (300 mg total) by mouth at bedtime.  . mirtazapine (REMERON) 7.5 MG tablet Take 1 tablet (7.5 mg total) by mouth at bedtime.  . NON FORMULARY at bedtime. BiPAP  . Omega-3 Fatty Acids (FISH OIL) 1000 MG CAPS Take 1,000 mg by mouth daily.   . ondansetron (ZOFRAN) 8 MG tablet Take 1 tablet (8 mg total) by mouth every 8 (eight) hours as needed for nausea or vomiting.  . rosuvastatin (CRESTOR) 5 MG tablet TAKE 1 TABLET EVERY OTHER DAY  . torsemide (DEMADEX) 20 MG tablet Take 1 tablet (20 mg total) by mouth as needed.  . traMADol (ULTRAM) 50 MG tablet Take 1-2 tablets (50-100 mg total) by mouth every 8 (eight) hours as needed.   No current  facility-administered medications for this visit. (Other)      REVIEW OF SYSTEMS:    ALLERGIES Allergies  Allergen Reactions  . Acetaminophen Anaphylaxis and Other (See Comments)    Difficulty urinating Difficulty urinating   . Benadryl [Diphenhydramine] Other (See Comments)    Tongue gets thick, feel jittery  . Zetia [Ezetimibe] Other (See Comments)    Chest pressure  . Gabapentin     dizziness  . Hydrochlorothiazide Nausea And Vomiting    Dizziness  . Ace Inhibitors Cough  . Codeine Other (See Comments)    difficulty urinating  . Erythromycin Other (See Comments)    DOESN'T REMEMBER   . Etodolac Nausea Only  . Ivp Dye [Iodinated Diagnostic Agents] Hives    Ok to proceed 13 hour prep without benadryl per Radiologist (Dr. Janeece Fitting 12/21/19 hp)     PAST MEDICAL HISTORY Past Medical History:  Diagnosis Date  . ACE-inhibitor cough   . COPD (chronic obstructive pulmonary disease) ([place])   . Diastolic dysfunction   . History of tobacco use    smoked 25 years  . Hypertension   . Mild CAD   . Mild pulmonary hypertension ([place])   . OSA (obstructive sleep apnea)    Sleep Study 04/07/2007 - AHI during total sleep 21.30/hr, during REM 20.16/hr - BiPAP auto servo-ventilation unit  Past Surgical History:  Procedure Laterality Date  . benign lesion removed on lung  2000  . BLADDER REPAIR    . CARDIAC CATHETERIZATION  05/20/2007   mild coronary obstructive disease with 20% narrowing in prox LAD, diffuse luminal irregularity of 40-50% in mid RCA (Dr. Corky Downs)  . Cardiopulmonary Met Test  01/28/2012   with PFTs - FEV1 & FEV1/VC WNL, VC WNL, DLCO WNL; abnormal pulmonary response  . Carotid Doppler  04/2011   normal patency  . CARPAL TUNNEL RELEASE     x2  . CATARACT EXTRACTION, BILATERAL    . ENDOBRONCHIAL ULTRASOUND N/A 06/12/2019   Procedure: ENDOBRONCHIAL ULTRASOUND;  Surgeon: Candee Furbish, MD;  Location: [facility] END[facility];  Service: Endoscopy;  Laterality: N/A;  . FINE  NEEDLE ASPIRATION  06/12/2019   Procedure: FINE NEEDLE ASPIRATION;  Surgeon: Candee Furbish, MD;  Location: [facility] END[facility];  Service: Endoscopy;;  . GALLBLADDER SURGERY    . NM MYOCAR PERF WALL MOTION  09/2011   lexiscan myoview - normal perfusion, EF 77%, low risk  . Renal Doppler  05/2007   normal renal arteries   . TONSILLECTOMY    . TRANSTHORACIC ECHOCARDIOGRAM  2013   EF 50-55%; mild MR; mild TR; mild pulm valve regurg; aortic root sclerosis/calcification  . VAGINAL HYSTERECTOMY    . VARICOSE VEIN SURGERY    . VIDEO BRONCHOSCOPY  06/12/2019   Procedure: VIDEO BRONCHOSCOPY;  Surgeon: Candee Furbish, MD;  Location: [facility];  Service: Endoscopy;;    FAMILY HISTORY Family History  Problem Relation Age of Onset  . Lung cancer Mother   . Suicidality Father   . Cancer Sister   . Breast cancer Daughter   . Stroke Sister     SOCIAL HISTORY Social History   Tobacco Use  . Smoking status: Former Smoker    Packs/day: 0.50    Years: 25.00    Pack years: 12.50    Types: Cigarettes    Quit date: 08/13/1984    Years since quitting: 36.0  . Smokeless tobacco: Never Used  Vaping Use  . Vaping Use: Never used  Substance Use Topics  . Alcohol use: No  . Drug use: No         OPHTHALMIC EXAM: Base Eye Exam    Visual Acuity (ETDRS)      Right Left   Dist cc e card @ 6 ft 20/30 -1   Dist ph cc NI NI   Correction: Glasses       Tonometry (Tonopen, 1:14 PM)      Right Left   Pressure 13 14       Pupils      Dark Light Shape React APD   Right 4 4 Round Minimal +1   Left 4 3 Round Brisk None       Visual Fields (Counting fingers)      Left Right    Full Full       Extraocular Movement      Right Left    Full Full       Neuro/Psych    Oriented x3: Yes   Mood/Affect: Normal       Dilation    Right eye: 1.0% Mydriacyl, 2.5% Phenylephrine @ 1:14 PM        Slit Lamp and Fundus Exam    External Exam      Right Left   External Normal        Slit Lamp  Exam  Right Left   Lids/Lashes Trace edema.    Conjunctiva/Sclera 2+ Injection    Cornea Clear    Anterior Chamber Deep and quiet    Iris Round and reactive    Lens Centered posterior chamber intraocular lens    Anterior Vitreous Clear, vitrectomized, rare RBC        Fundus Exam      Right Left   Posterior Vitreous Clear vitrectomized,     Disc Premacular pigmentary deposits, no change from time of surgery    C/D Ratio 0.4    Macula Macula attached, Cystoid macular edema, Intraretinal hemorrhage, Microaneurysms    Vessels Old central retinal vein occlusion, with thready poor peripheral vascularization    Periphery Attached, good PRP 360           IMAGING AND PROCEDURES  Imaging and Procedures for 09/05/20  OCT, Retina - OU - Both Eyes       Right Eye Quality was good. Scan locations included subfoveal. Central Foveal Thickness: 429. Progression has no prior data. Findings include cystoid macular edema.   Left Eye Quality was good. Scan locations included subfoveal. Central Foveal Thickness: 248. Progression has been stable.   Notes Massive CME from underlying central retinal vein occlusion right eye, will need intravitreal Avastin commence today       Intravitreal Injection, Pharmacologic Agent - OD - Right Eye       Time Out 09/05/2020. 1:32 PM. Confirmed correct patient, procedure, site, and patient consented.   Anesthesia Topical anesthesia was used.   Procedure Preparation included Tobramycin 0.3%, 5% betadine to ocular surface, 10% betadine to eyelids. A 30 gauge needle was used.   Injection:  2.5 mg Bevacizumab (AVASTIN) 2.78m/0.1mL SOSY   NDC: 702542-706-23 Lot:: [id number]  Route: Intravitreal, Site: Right Eye  Post-op Post injection exam found visual acuity of at least counting fingers. The patient tolerated the procedure well. There were no complications. The patient received written and verbal post procedure care education. Post injection  medications were not given.                 ASSESSMENT/PLAN:  Central retinal vein occlusion with macular edema of right eye The nature of central retinal vein occlusion was discussed with the patient including the division of types into nonischemic ischemic. The potential sequelae of ischemic central retinal vein occlusion, including macular edema, neovascularization, rubeosis iridis, and neovascular glaucoma, were discussed, and the need for frequent follow-up.  The nature of macular edema and central retinal vein occlusion was discussed. The following options were considered:  1.Observation for a period to look for spontaneous improvement, is no linger the primary therapy. One-third worsen, one-third stay unchanged, and one-third improves.  2. Anti-VEGF Therapy. ( Lucentis, Avastin or Eylea ) injected  in intravitreal fashion, initially monthly then tailored to clinical response.  3. Intravitreal steroid usage, Kenalog, or Ozurdex, usually a second line therapy or in combination with anti-Vegf therapy noted above.  4. Panretinal laser photocoagulation to cause regression of iris neovascularization, or treat retinal  non-perfusion.  5. Surgical Management may include vitrectomy with incisions of peripheral veins to trigger retino choroidal anastomosis formation. This topic presented and discussed at [facility]  We will commence with intravitreal Avastin OD to limit and improved CME   Vitreous hemorrhage of right eye ([place] Condition resolved OD post vitrectomy      ICD-10-CM   1. Central retinal vein occlusion with macular edema of right eye  H34.8110 Intravitreal Injection, Pharmacologic Agent -  OD - Right Eye    bevacizumab (AVASTIN) SOSY 2.5 mg  2. Central retinal vein occlusion with neovascularization of right eye  H34.8111 OCT, Retina - OU - Both Eyes  3. Vitreous hemorrhage of right eye (HCC)  H43.11     1.  OD with cystoid macular edema is a remnant of central retinal  vein occlusion.  Prior neovascularization has appeared to abate with peripheral PRP and clearance of vitreous hemorrhage.  Commence therapy OD with intravitreal Avastin in order to clear the residual CME  2.  3.  Ophthalmic Meds Ordered this visit:  Meds ordered this encounter  Medications  . bevacizumab (AVASTIN) SOSY 2.5 mg       Return in about 6 weeks (around 10/17/2020) for dilate, OD, AVASTIN OCT.  There are no Patient Instructions on file for this visit.   Explained the diagnoses, plan, and follow up with the patient and they expressed understanding.  Patient expressed understanding of the importance of proper follow up care.   Clent Demark [place] Blatchley M.D. Diseases & Surgery of the Retina and Vitreous Retina & Diabetic [place] 09/05/20     Abbreviations: M myopia (nearsighted); A astigmatism; H hyperopia (farsighted); P presbyopia; Mrx spectacle prescription;  CTL contact lenses; OD right eye; OS left eye; OU both eyes  XT exotropia; ET esotropia; PEK punctate epithelial keratitis; PEE punctate epithelial erosions; DES dry eye syndrome; MGD meibomian gland dysfunction; ATs artificial tears; PFAT's preservative free artificial tears; [place] nuclear sclerotic cataract; PSC posterior subcapsular cataract; ERM epi-retinal membrane; PVD posterior vitreous detachment; RD retinal detachment; DM diabetes mellitus; DR diabetic retinopathy; NPDR non-proliferative diabetic retinopathy; PDR proliferative diabetic retinopathy; CSME clinically significant macular edema; DME diabetic macular edema; dbh dot blot hemorrhages; CWS cotton wool spot; POAG primary open angle glaucoma; C/D cup-to-disc ratio; HVF humphrey visual field; GVF goldmann visual field; OCT optical coherence tomography; IOP intraocular pressure; BRVO Branch retinal vein occlusion; CRVO central retinal vein occlusion; CRAO central retinal artery occlusion; BRAO branch retinal artery occlusion; RT retinal tear; SB scleral buckle; PPV  pars plana vitrectomy; VH Vitreous hemorrhage; PRP panretinal laser photocoagulation; IVK intravitreal kenalog; VMT vitreomacular traction; MH Macular hole;  NVD neovascularization of the disc; NVE neovascularization elsewhere; AREDS age related eye disease study; ARMD age related macular degeneration; POAG primary open angle glaucoma; EBMD epithelial/anterior basement membrane dystrophy; ACIOL anterior chamber intraocular lens; IOL intraocular lens; PCIOL posterior chamber intraocular lens; Phaco/IOL phacoemulsification with intraocular lens placement; [place] photorefractive keratectomy; LASIK laser assisted in situ keratomileusis; HTN hypertension; DM diabetes mellitus; COPD chronic obstructive pulmonary disease

## 2020-09-05 NOTE — Assessment & Plan Note (Signed)
No signs of neovascularization OD

## 2020-09-05 NOTE — Assessment & Plan Note (Signed)
Condition resolved OD post vitrectomy

## 2020-09-05 NOTE — Assessment & Plan Note (Signed)
The nature of central retinal vein occlusion was discussed with the patient including the division of types into nonischemic ischemic. The potential sequelae of ischemic central retinal vein occlusion, including macular edema, neovascularization, rubeosis iridis, and neovascular glaucoma, were discussed, and the need for frequent follow-up.  The nature of macular edema and central retinal vein occlusion was discussed. The following options were considered:  1.Observation for a period to look for spontaneous improvement, is no linger the primary therapy. One-third worsen, one-third stay unchanged, and one-third improves.  2. Anti-VEGF Therapy. ( Lucentis, Avastin or Eylea ) injected  in intravitreal fashion, initially monthly then tailored to clinical response.  3. Intravitreal steroid usage, Kenalog, or Ozurdex, usually a second line therapy or in combination with anti-Vegf therapy noted above.  4. Panretinal laser photocoagulation to cause regression of iris neovascularization, or treat retinal  non-perfusion.  5. Surgical Management may include vitrectomy with incisions of peripheral veins to trigger retino choroidal anastomosis formation. This topic presented and discussed at [place].  We will commence with intravitreal Avastin OD to limit and improved CME

## 2020-09-05 NOTE — Telephone Encounter (Signed)
Received vm message  Ms. Can's Hospice nurse, Solmon Ice. She states that her son is asking for medication to stimulate her appetite. She suggests remeron 7.5 mg at bedtime. Discussed with Dr. Lorenso Courier and he is agreeable to this. Prescription sent in to pt's pharmacy.  [name] pt's hospice nurse to make her aware.

## 2020-09-15 ENCOUNTER — Telehealth: Payer: Self-pay | Admitting: *Deleted

## 2020-09-15 NOTE — Telephone Encounter (Signed)
Received call from pt's son, Merry Proud. He states that his mother has hospice services now and wants to cancel her future appts with Dr. Lorenso Courier. He states she is too weak to come to them now. He states Hospice is taking good care of [place].   Dr. Lorenso Courier made aware.

## 2020-09-21 ENCOUNTER — Encounter: Payer: Self-pay | Admitting: Hematology & Oncology

## 2020-09-26 ENCOUNTER — Ambulatory Visit: Payer: Medicare HMO | Admitting: Hematology and Oncology

## 2020-09-26 ENCOUNTER — Other Ambulatory Visit: Payer: Medicare HMO

## 2020-09-26 ENCOUNTER — Telehealth: Payer: Self-pay | Admitting: *Deleted

## 2020-09-26 ENCOUNTER — Other Ambulatory Visit: Payer: Self-pay | Admitting: Hematology and Oncology

## 2020-09-26 MED ORDER — TRAMADOL HCL 50 MG PO TABS: 50.0000 mg | ORAL_TABLET | Freq: Three times a day (TID) | ORAL | 0 refills | Status: AC | PRN

## 2020-09-26 NOTE — Telephone Encounter (Signed)
Received call from [place] requesting refill for her Tramadol. She takes 1-2 tabs every 8 hours as needed  [employer] on [address] [employer]

## 2020-10-14 ENCOUNTER — Ambulatory Visit: Payer: Medicare HMO | Admitting: Cardiovascular Disease

## 2020-10-17 ENCOUNTER — Encounter (INDEPENDENT_AMBULATORY_CARE_PROVIDER_SITE_OTHER): Payer: Medicare HMO | Admitting: Ophthalmology

## 2020-11-11 DEATH — deceased

## 2021-01-19 ENCOUNTER — Encounter: Payer: Self-pay | Admitting: Cardiovascular Disease
